# Patient Record
Sex: Female | Born: 1935
Health system: Southern US, Community
[De-identification: ages and names within clinical notes are randomized; demographics above are authoritative.]

## PROBLEM LIST (undated history)

## (undated) DIAGNOSIS — I609 Nontraumatic subarachnoid hemorrhage, unspecified: Secondary | ICD-10-CM

## (undated) DIAGNOSIS — S72001A Fracture of unspecified part of neck of right femur, initial encounter for closed fracture: Secondary | ICD-10-CM

## (undated) DIAGNOSIS — K635 Polyp of colon: Secondary | ICD-10-CM

## (undated) DIAGNOSIS — E785 Hyperlipidemia, unspecified: Secondary | ICD-10-CM

## (undated) DIAGNOSIS — S42291A Other displaced fracture of upper end of right humerus, initial encounter for closed fracture: Secondary | ICD-10-CM

## (undated) DIAGNOSIS — Z9989 Dependence on other enabling machines and devices: Principal | ICD-10-CM

## (undated) DIAGNOSIS — C50919 Malignant neoplasm of unspecified site of unspecified female breast: Secondary | ICD-10-CM

## (undated) DIAGNOSIS — M75100 Unspecified rotator cuff tear or rupture of unspecified shoulder, not specified as traumatic: Secondary | ICD-10-CM

## (undated) DIAGNOSIS — I459 Conduction disorder, unspecified: Secondary | ICD-10-CM

## (undated) DIAGNOSIS — I10 Essential (primary) hypertension: Secondary | ICD-10-CM

## (undated) DIAGNOSIS — Z974 Presence of external hearing-aid: Secondary | ICD-10-CM

## (undated) DIAGNOSIS — G5620 Lesion of ulnar nerve, unspecified upper limb: Secondary | ICD-10-CM

## (undated) DIAGNOSIS — H3530 Unspecified macular degeneration: Secondary | ICD-10-CM

## (undated) DIAGNOSIS — K219 Gastro-esophageal reflux disease without esophagitis: Secondary | ICD-10-CM

## (undated) DIAGNOSIS — K649 Unspecified hemorrhoids: Secondary | ICD-10-CM

## (undated) DIAGNOSIS — M8580 Other specified disorders of bone density and structure, unspecified site: Secondary | ICD-10-CM

## (undated) DIAGNOSIS — K429 Umbilical hernia without obstruction or gangrene: Secondary | ICD-10-CM

## (undated) DIAGNOSIS — I447 Left bundle-branch block, unspecified: Secondary | ICD-10-CM

## (undated) DIAGNOSIS — I219 Acute myocardial infarction, unspecified: Secondary | ICD-10-CM

## (undated) DIAGNOSIS — Z950 Presence of cardiac pacemaker: Secondary | ICD-10-CM

## (undated) DIAGNOSIS — I2510 Atherosclerotic heart disease of native coronary artery without angina pectoris: Secondary | ICD-10-CM

## (undated) DIAGNOSIS — G4733 Obstructive sleep apnea (adult) (pediatric): Secondary | ICD-10-CM

## (undated) DIAGNOSIS — J310 Chronic rhinitis: Secondary | ICD-10-CM

## (undated) DIAGNOSIS — R2681 Unsteadiness on feet: Secondary | ICD-10-CM

## (undated) HISTORY — DX: Presence of cardiac pacemaker: Z95.0

## (undated) HISTORY — DX: Lesion of ulnar nerve, unspecified upper limb: G56.20

## (undated) HISTORY — PX: CATARACT EXTRACTION, BILATERAL: SHX1313

## (undated) HISTORY — DX: Unspecified macular degeneration: H35.30

## (undated) HISTORY — DX: Conduction disorder, unspecified: I45.9

## (undated) HISTORY — DX: Unspecified rotator cuff tear or rupture of unspecified shoulder, not specified as traumatic: M75.100

## (undated) HISTORY — DX: Nontraumatic subarachnoid hemorrhage, unspecified: I60.9

## (undated) HISTORY — PX: BREAST LUMPECTOMY: SHX2

## (undated) HISTORY — DX: Left bundle-branch block, unspecified: I44.7

## (undated) HISTORY — DX: Umbilical hernia without obstruction or gangrene: K42.9

## (undated) HISTORY — PX: SHOULDER SURGERY: SHX246

## (undated) HISTORY — DX: Chronic rhinitis: J31.0

## (undated) HISTORY — DX: Other displaced fracture of upper end of right humerus, initial encounter for closed fracture: S42.291A

## (undated) HISTORY — DX: Obstructive sleep apnea (adult) (pediatric): G47.33

## (undated) HISTORY — DX: Polyp of colon: K63.5

## (undated) HISTORY — DX: Other specified disorders of bone density and structure, unspecified site: M85.80

## (undated) HISTORY — PX: TONSILLECTOMY: SUR1361

## (undated) HISTORY — PX: WRIST SURGERY: SHX841

## (undated) HISTORY — DX: Malignant neoplasm of unspecified site of unspecified female breast: C50.919

## (undated) HISTORY — DX: Gastro-esophageal reflux disease without esophagitis: K21.9

## (undated) HISTORY — DX: Unspecified hemorrhoids: K64.9

## (undated) HISTORY — DX: Hyperlipidemia, unspecified: E78.5

## (undated) HISTORY — DX: Presence of external hearing-aid: Z97.4

## (undated) HISTORY — DX: Fracture of unspecified part of neck of right femur, initial encounter for closed fracture: S72.001A

## (undated) HISTORY — DX: Unsteadiness on feet: R26.81

## (undated) HISTORY — DX: Dependence on other enabling machines and devices: Z99.89

## (undated) HISTORY — DX: Atherosclerotic heart disease of native coronary artery without angina pectoris: I25.10

---

## 1998-01-20 ENCOUNTER — Other Ambulatory Visit: Admission: RE | Admit: 1998-01-20 | Discharge: 1998-01-20 | Payer: Self-pay | Admitting: Obstetrics and Gynecology

## 1998-04-30 ENCOUNTER — Ambulatory Visit (HOSPITAL_COMMUNITY): Admission: RE | Admit: 1998-04-30 | Discharge: 1998-04-30 | Payer: Self-pay | Admitting: Internal Medicine

## 1998-04-30 ENCOUNTER — Encounter: Payer: Self-pay | Admitting: Internal Medicine

## 1999-02-25 ENCOUNTER — Other Ambulatory Visit: Admission: RE | Admit: 1999-02-25 | Discharge: 1999-02-25 | Payer: Self-pay | Admitting: Obstetrics and Gynecology

## 1999-05-05 ENCOUNTER — Ambulatory Visit (HOSPITAL_COMMUNITY): Admission: RE | Admit: 1999-05-05 | Discharge: 1999-05-05 | Payer: Self-pay | Admitting: Obstetrics and Gynecology

## 1999-05-05 ENCOUNTER — Encounter: Payer: Self-pay | Admitting: Obstetrics and Gynecology

## 2000-02-24 ENCOUNTER — Ambulatory Visit (HOSPITAL_BASED_OUTPATIENT_CLINIC_OR_DEPARTMENT_OTHER): Admission: RE | Admit: 2000-02-24 | Discharge: 2000-02-24 | Payer: Self-pay | Admitting: Otolaryngology

## 2000-06-03 ENCOUNTER — Encounter: Payer: Self-pay | Admitting: Obstetrics and Gynecology

## 2000-06-03 ENCOUNTER — Ambulatory Visit (HOSPITAL_COMMUNITY): Admission: RE | Admit: 2000-06-03 | Discharge: 2000-06-03 | Payer: Self-pay | Admitting: Obstetrics and Gynecology

## 2000-09-01 ENCOUNTER — Encounter: Admission: RE | Admit: 2000-09-01 | Discharge: 2000-09-01 | Payer: Self-pay | Admitting: Internal Medicine

## 2000-09-01 ENCOUNTER — Encounter: Payer: Self-pay | Admitting: Internal Medicine

## 2001-01-19 ENCOUNTER — Other Ambulatory Visit: Admission: RE | Admit: 2001-01-19 | Discharge: 2001-01-19 | Payer: Self-pay | Admitting: Obstetrics and Gynecology

## 2001-06-27 ENCOUNTER — Ambulatory Visit (HOSPITAL_COMMUNITY): Admission: RE | Admit: 2001-06-27 | Discharge: 2001-06-27 | Payer: Self-pay | Admitting: Obstetrics and Gynecology

## 2001-06-27 ENCOUNTER — Encounter: Payer: Self-pay | Admitting: Obstetrics and Gynecology

## 2002-02-19 ENCOUNTER — Other Ambulatory Visit: Admission: RE | Admit: 2002-02-19 | Discharge: 2002-02-19 | Payer: Self-pay | Admitting: Obstetrics and Gynecology

## 2002-09-14 ENCOUNTER — Ambulatory Visit (HOSPITAL_COMMUNITY): Admission: RE | Admit: 2002-09-14 | Discharge: 2002-09-14 | Payer: Self-pay | Admitting: Obstetrics and Gynecology

## 2002-09-14 ENCOUNTER — Encounter: Payer: Self-pay | Admitting: Obstetrics and Gynecology

## 2003-07-09 ENCOUNTER — Ambulatory Visit (HOSPITAL_COMMUNITY): Admission: RE | Admit: 2003-07-09 | Discharge: 2003-07-09 | Payer: Self-pay | Admitting: Gastroenterology

## 2003-07-09 ENCOUNTER — Encounter (INDEPENDENT_AMBULATORY_CARE_PROVIDER_SITE_OTHER): Payer: Self-pay | Admitting: Specialist

## 2003-09-16 ENCOUNTER — Ambulatory Visit (HOSPITAL_COMMUNITY): Admission: RE | Admit: 2003-09-16 | Discharge: 2003-09-16 | Payer: Self-pay | Admitting: Obstetrics and Gynecology

## 2004-08-06 ENCOUNTER — Ambulatory Visit (HOSPITAL_COMMUNITY): Admission: RE | Admit: 2004-08-06 | Discharge: 2004-08-06 | Payer: Self-pay | Admitting: Obstetrics and Gynecology

## 2004-09-16 ENCOUNTER — Ambulatory Visit (HOSPITAL_COMMUNITY): Admission: RE | Admit: 2004-09-16 | Discharge: 2004-09-16 | Payer: Self-pay | Admitting: Obstetrics and Gynecology

## 2004-10-28 ENCOUNTER — Encounter: Admission: RE | Admit: 2004-10-28 | Discharge: 2004-10-28 | Payer: Self-pay | Admitting: Orthopedic Surgery

## 2004-10-29 ENCOUNTER — Ambulatory Visit (HOSPITAL_BASED_OUTPATIENT_CLINIC_OR_DEPARTMENT_OTHER): Admission: RE | Admit: 2004-10-29 | Discharge: 2004-10-29 | Payer: Self-pay | Admitting: Orthopedic Surgery

## 2004-10-29 ENCOUNTER — Ambulatory Visit (HOSPITAL_COMMUNITY): Admission: RE | Admit: 2004-10-29 | Discharge: 2004-10-29 | Payer: Self-pay | Admitting: Orthopedic Surgery

## 2005-09-16 ENCOUNTER — Ambulatory Visit (HOSPITAL_BASED_OUTPATIENT_CLINIC_OR_DEPARTMENT_OTHER): Admission: RE | Admit: 2005-09-16 | Discharge: 2005-09-17 | Payer: Self-pay | Admitting: Orthopedic Surgery

## 2005-12-10 ENCOUNTER — Ambulatory Visit (HOSPITAL_COMMUNITY): Admission: RE | Admit: 2005-12-10 | Discharge: 2005-12-10 | Payer: Self-pay | Admitting: Obstetrics and Gynecology

## 2006-06-24 DIAGNOSIS — I2510 Atherosclerotic heart disease of native coronary artery without angina pectoris: Secondary | ICD-10-CM | POA: Insufficient documentation

## 2006-06-24 HISTORY — DX: Atherosclerotic heart disease of native coronary artery without angina pectoris: I25.10

## 2006-06-24 HISTORY — PX: CORONARY ANGIOPLASTY WITH STENT PLACEMENT: SHX49

## 2006-07-20 ENCOUNTER — Inpatient Hospital Stay (HOSPITAL_COMMUNITY): Admission: EM | Admit: 2006-07-20 | Discharge: 2006-07-24 | Payer: Self-pay | Admitting: Emergency Medicine

## 2006-08-11 ENCOUNTER — Encounter (HOSPITAL_COMMUNITY): Admission: RE | Admit: 2006-08-11 | Discharge: 2006-11-09 | Payer: Self-pay | Admitting: Interventional Cardiology

## 2006-11-10 ENCOUNTER — Encounter (HOSPITAL_COMMUNITY): Admission: RE | Admit: 2006-11-10 | Discharge: 2006-11-27 | Payer: Self-pay | Admitting: Interventional Cardiology

## 2006-11-29 ENCOUNTER — Encounter (HOSPITAL_COMMUNITY): Admission: RE | Admit: 2006-11-29 | Discharge: 2007-02-27 | Payer: Self-pay | Admitting: Interventional Cardiology

## 2006-12-16 ENCOUNTER — Ambulatory Visit (HOSPITAL_COMMUNITY): Admission: RE | Admit: 2006-12-16 | Discharge: 2006-12-16 | Payer: Self-pay | Admitting: Obstetrics and Gynecology

## 2006-12-22 ENCOUNTER — Encounter: Admission: RE | Admit: 2006-12-22 | Discharge: 2006-12-22 | Payer: Self-pay | Admitting: Obstetrics and Gynecology

## 2007-01-04 ENCOUNTER — Encounter: Admission: RE | Admit: 2007-01-04 | Discharge: 2007-01-04 | Payer: Self-pay | Admitting: Obstetrics and Gynecology

## 2007-01-04 ENCOUNTER — Encounter (INDEPENDENT_AMBULATORY_CARE_PROVIDER_SITE_OTHER): Payer: Self-pay | Admitting: Diagnostic Radiology

## 2007-01-17 ENCOUNTER — Encounter: Admission: RE | Admit: 2007-01-17 | Discharge: 2007-01-17 | Payer: Self-pay | Admitting: Obstetrics and Gynecology

## 2007-01-18 ENCOUNTER — Encounter: Admission: RE | Admit: 2007-01-18 | Discharge: 2007-01-18 | Payer: Self-pay | Admitting: Obstetrics and Gynecology

## 2007-01-30 ENCOUNTER — Encounter: Admission: RE | Admit: 2007-01-30 | Discharge: 2007-01-30 | Payer: Self-pay | Admitting: Obstetrics and Gynecology

## 2007-01-31 ENCOUNTER — Encounter (INDEPENDENT_AMBULATORY_CARE_PROVIDER_SITE_OTHER): Payer: Self-pay | Admitting: Surgery

## 2007-01-31 ENCOUNTER — Ambulatory Visit (HOSPITAL_BASED_OUTPATIENT_CLINIC_OR_DEPARTMENT_OTHER): Admission: RE | Admit: 2007-01-31 | Discharge: 2007-01-31 | Payer: Self-pay | Admitting: Surgery

## 2007-01-31 ENCOUNTER — Encounter: Admission: RE | Admit: 2007-01-31 | Discharge: 2007-01-31 | Payer: Self-pay | Admitting: Surgery

## 2007-02-14 ENCOUNTER — Ambulatory Visit: Payer: Self-pay | Admitting: Oncology

## 2007-02-22 ENCOUNTER — Ambulatory Visit: Admission: RE | Admit: 2007-02-22 | Discharge: 2007-04-12 | Payer: Self-pay | Admitting: Radiation Oncology

## 2007-04-07 ENCOUNTER — Ambulatory Visit: Payer: Self-pay | Admitting: Oncology

## 2007-06-06 ENCOUNTER — Encounter (HOSPITAL_COMMUNITY): Admission: RE | Admit: 2007-06-06 | Discharge: 2007-09-04 | Payer: Self-pay | Admitting: Interventional Cardiology

## 2007-06-08 ENCOUNTER — Ambulatory Visit: Payer: Self-pay | Admitting: Oncology

## 2007-09-24 ENCOUNTER — Encounter (HOSPITAL_COMMUNITY): Admission: RE | Admit: 2007-09-24 | Discharge: 2007-11-22 | Payer: Self-pay | Admitting: Interventional Cardiology

## 2007-11-23 ENCOUNTER — Encounter (HOSPITAL_COMMUNITY): Admission: RE | Admit: 2007-11-23 | Discharge: 2008-02-21 | Payer: Self-pay | Admitting: Interventional Cardiology

## 2007-12-25 ENCOUNTER — Encounter: Admission: RE | Admit: 2007-12-25 | Discharge: 2007-12-25 | Payer: Self-pay | Admitting: Internal Medicine

## 2008-02-22 ENCOUNTER — Encounter (HOSPITAL_COMMUNITY): Admission: RE | Admit: 2008-02-22 | Discharge: 2008-05-20 | Payer: Self-pay | Admitting: Interventional Cardiology

## 2008-05-24 ENCOUNTER — Encounter (HOSPITAL_COMMUNITY): Admission: RE | Admit: 2008-05-24 | Discharge: 2008-08-22 | Payer: Self-pay | Admitting: Interventional Cardiology

## 2008-08-02 ENCOUNTER — Ambulatory Visit: Payer: Self-pay | Admitting: Oncology

## 2008-08-23 ENCOUNTER — Encounter (HOSPITAL_COMMUNITY): Admission: RE | Admit: 2008-08-23 | Discharge: 2008-11-21 | Payer: Self-pay | Admitting: Interventional Cardiology

## 2008-11-22 ENCOUNTER — Encounter (HOSPITAL_COMMUNITY): Admission: RE | Admit: 2008-11-22 | Discharge: 2009-02-20 | Payer: Self-pay | Admitting: Interventional Cardiology

## 2008-12-25 ENCOUNTER — Encounter: Admission: RE | Admit: 2008-12-25 | Discharge: 2008-12-25 | Payer: Self-pay | Admitting: Obstetrics and Gynecology

## 2009-01-29 ENCOUNTER — Encounter: Admission: RE | Admit: 2009-01-29 | Discharge: 2009-01-29 | Payer: Self-pay | Admitting: Internal Medicine

## 2009-02-21 ENCOUNTER — Encounter (HOSPITAL_COMMUNITY): Admission: RE | Admit: 2009-02-21 | Discharge: 2009-05-22 | Payer: Self-pay | Admitting: Interventional Cardiology

## 2009-05-23 ENCOUNTER — Emergency Department (HOSPITAL_COMMUNITY): Admission: EM | Admit: 2009-05-23 | Discharge: 2009-05-23 | Payer: Self-pay | Admitting: Emergency Medicine

## 2009-05-24 ENCOUNTER — Encounter (HOSPITAL_COMMUNITY): Admission: RE | Admit: 2009-05-24 | Discharge: 2009-08-22 | Payer: Self-pay | Admitting: Interventional Cardiology

## 2009-08-01 ENCOUNTER — Ambulatory Visit: Payer: Self-pay | Admitting: Oncology

## 2009-08-23 ENCOUNTER — Encounter (HOSPITAL_COMMUNITY): Admission: RE | Admit: 2009-08-23 | Discharge: 2009-11-21 | Payer: Self-pay | Admitting: Interventional Cardiology

## 2009-11-22 ENCOUNTER — Encounter (HOSPITAL_COMMUNITY): Admission: RE | Admit: 2009-11-22 | Discharge: 2010-02-20 | Payer: Self-pay | Admitting: Interventional Cardiology

## 2009-12-29 ENCOUNTER — Encounter: Admission: RE | Admit: 2009-12-29 | Discharge: 2009-12-29 | Payer: Self-pay | Admitting: Interventional Cardiology

## 2010-01-19 ENCOUNTER — Encounter: Admission: RE | Admit: 2010-01-19 | Discharge: 2010-01-19 | Payer: Self-pay | Admitting: Oncology

## 2010-02-13 ENCOUNTER — Ambulatory Visit: Payer: Self-pay | Admitting: Oncology

## 2010-02-21 ENCOUNTER — Encounter (HOSPITAL_COMMUNITY)
Admission: RE | Admit: 2010-02-21 | Discharge: 2010-05-22 | Payer: Self-pay | Source: Home / Self Care | Attending: Interventional Cardiology | Admitting: Interventional Cardiology

## 2010-05-11 ENCOUNTER — Ambulatory Visit: Payer: Self-pay | Admitting: Oncology

## 2010-06-03 ENCOUNTER — Encounter (HOSPITAL_COMMUNITY)
Admission: RE | Admit: 2010-06-03 | Discharge: 2010-06-23 | Payer: Self-pay | Source: Home / Self Care | Attending: Interventional Cardiology | Admitting: Interventional Cardiology

## 2010-06-24 ENCOUNTER — Ambulatory Visit (HOSPITAL_COMMUNITY): Payer: Self-pay | Attending: Interventional Cardiology

## 2010-06-25 ENCOUNTER — Encounter (HOSPITAL_COMMUNITY): Payer: Self-pay | Attending: Interventional Cardiology

## 2010-06-25 DIAGNOSIS — I447 Left bundle-branch block, unspecified: Secondary | ICD-10-CM | POA: Insufficient documentation

## 2010-06-25 DIAGNOSIS — E876 Hypokalemia: Secondary | ICD-10-CM | POA: Insufficient documentation

## 2010-06-25 DIAGNOSIS — I2510 Atherosclerotic heart disease of native coronary artery without angina pectoris: Secondary | ICD-10-CM | POA: Insufficient documentation

## 2010-06-25 DIAGNOSIS — Z9861 Coronary angioplasty status: Secondary | ICD-10-CM | POA: Insufficient documentation

## 2010-06-25 DIAGNOSIS — Z5189 Encounter for other specified aftercare: Secondary | ICD-10-CM | POA: Insufficient documentation

## 2010-06-25 DIAGNOSIS — I959 Hypotension, unspecified: Secondary | ICD-10-CM | POA: Insufficient documentation

## 2010-06-25 DIAGNOSIS — I472 Ventricular tachycardia, unspecified: Secondary | ICD-10-CM | POA: Insufficient documentation

## 2010-06-25 DIAGNOSIS — E785 Hyperlipidemia, unspecified: Secondary | ICD-10-CM | POA: Insufficient documentation

## 2010-06-25 DIAGNOSIS — I4729 Other ventricular tachycardia: Secondary | ICD-10-CM | POA: Insufficient documentation

## 2010-06-25 DIAGNOSIS — I498 Other specified cardiac arrhythmias: Secondary | ICD-10-CM | POA: Insufficient documentation

## 2010-06-25 DIAGNOSIS — I252 Old myocardial infarction: Secondary | ICD-10-CM | POA: Insufficient documentation

## 2010-06-30 ENCOUNTER — Encounter (HOSPITAL_COMMUNITY): Payer: Self-pay

## 2010-07-01 ENCOUNTER — Encounter (HOSPITAL_COMMUNITY): Payer: Self-pay

## 2010-07-02 ENCOUNTER — Encounter (HOSPITAL_COMMUNITY): Payer: Self-pay

## 2010-07-07 ENCOUNTER — Encounter (HOSPITAL_COMMUNITY): Payer: Self-pay

## 2010-07-08 ENCOUNTER — Encounter (HOSPITAL_COMMUNITY): Payer: Self-pay

## 2010-07-09 ENCOUNTER — Encounter (HOSPITAL_COMMUNITY): Payer: Self-pay

## 2010-07-14 ENCOUNTER — Encounter (HOSPITAL_COMMUNITY): Payer: Self-pay

## 2010-07-15 ENCOUNTER — Encounter (HOSPITAL_COMMUNITY): Payer: Self-pay

## 2010-07-16 ENCOUNTER — Encounter (HOSPITAL_COMMUNITY): Payer: Self-pay

## 2010-07-21 ENCOUNTER — Encounter (HOSPITAL_COMMUNITY): Payer: Self-pay

## 2010-07-22 ENCOUNTER — Encounter (HOSPITAL_COMMUNITY): Payer: Self-pay

## 2010-07-23 ENCOUNTER — Encounter (HOSPITAL_COMMUNITY): Payer: Self-pay | Attending: Interventional Cardiology

## 2010-07-23 DIAGNOSIS — Z5189 Encounter for other specified aftercare: Secondary | ICD-10-CM | POA: Insufficient documentation

## 2010-07-23 DIAGNOSIS — I959 Hypotension, unspecified: Secondary | ICD-10-CM | POA: Insufficient documentation

## 2010-07-23 DIAGNOSIS — I472 Ventricular tachycardia, unspecified: Secondary | ICD-10-CM | POA: Insufficient documentation

## 2010-07-23 DIAGNOSIS — I447 Left bundle-branch block, unspecified: Secondary | ICD-10-CM | POA: Insufficient documentation

## 2010-07-23 DIAGNOSIS — I4729 Other ventricular tachycardia: Secondary | ICD-10-CM | POA: Insufficient documentation

## 2010-07-23 DIAGNOSIS — E876 Hypokalemia: Secondary | ICD-10-CM | POA: Insufficient documentation

## 2010-07-23 DIAGNOSIS — E785 Hyperlipidemia, unspecified: Secondary | ICD-10-CM | POA: Insufficient documentation

## 2010-07-23 DIAGNOSIS — I2510 Atherosclerotic heart disease of native coronary artery without angina pectoris: Secondary | ICD-10-CM | POA: Insufficient documentation

## 2010-07-23 DIAGNOSIS — Z9861 Coronary angioplasty status: Secondary | ICD-10-CM | POA: Insufficient documentation

## 2010-07-23 DIAGNOSIS — I498 Other specified cardiac arrhythmias: Secondary | ICD-10-CM | POA: Insufficient documentation

## 2010-07-23 DIAGNOSIS — I252 Old myocardial infarction: Secondary | ICD-10-CM | POA: Insufficient documentation

## 2010-07-28 ENCOUNTER — Encounter (HOSPITAL_COMMUNITY): Payer: Self-pay

## 2010-07-29 ENCOUNTER — Encounter (HOSPITAL_COMMUNITY): Payer: Self-pay

## 2010-07-30 ENCOUNTER — Encounter (HOSPITAL_COMMUNITY): Payer: Self-pay

## 2010-08-04 ENCOUNTER — Encounter (HOSPITAL_COMMUNITY): Payer: Self-pay

## 2010-08-05 ENCOUNTER — Encounter (HOSPITAL_COMMUNITY): Payer: Self-pay

## 2010-08-06 ENCOUNTER — Encounter (HOSPITAL_BASED_OUTPATIENT_CLINIC_OR_DEPARTMENT_OTHER): Payer: Medicare Other | Admitting: Oncology

## 2010-08-06 ENCOUNTER — Encounter (HOSPITAL_COMMUNITY): Payer: Self-pay

## 2010-08-06 DIAGNOSIS — Z170 Estrogen receptor positive status [ER+]: Secondary | ICD-10-CM

## 2010-08-06 DIAGNOSIS — D0590 Unspecified type of carcinoma in situ of unspecified breast: Secondary | ICD-10-CM

## 2010-08-11 ENCOUNTER — Encounter (HOSPITAL_COMMUNITY): Payer: Self-pay

## 2010-08-12 ENCOUNTER — Encounter (HOSPITAL_COMMUNITY): Payer: Self-pay

## 2010-08-13 ENCOUNTER — Encounter (HOSPITAL_COMMUNITY): Payer: Self-pay

## 2010-08-18 ENCOUNTER — Encounter (HOSPITAL_COMMUNITY): Payer: Self-pay

## 2010-08-19 ENCOUNTER — Encounter (HOSPITAL_COMMUNITY): Payer: Self-pay

## 2010-08-20 ENCOUNTER — Encounter (HOSPITAL_COMMUNITY): Payer: Self-pay

## 2010-08-24 LAB — CBC
Hemoglobin: 12 g/dL (ref 12.0–15.0)
RDW: 11.9 % (ref 11.5–15.5)

## 2010-08-25 ENCOUNTER — Encounter (HOSPITAL_COMMUNITY): Payer: Self-pay | Attending: Interventional Cardiology

## 2010-08-25 DIAGNOSIS — Z5189 Encounter for other specified aftercare: Secondary | ICD-10-CM | POA: Insufficient documentation

## 2010-08-25 DIAGNOSIS — I498 Other specified cardiac arrhythmias: Secondary | ICD-10-CM | POA: Insufficient documentation

## 2010-08-25 DIAGNOSIS — I447 Left bundle-branch block, unspecified: Secondary | ICD-10-CM | POA: Insufficient documentation

## 2010-08-25 DIAGNOSIS — I2510 Atherosclerotic heart disease of native coronary artery without angina pectoris: Secondary | ICD-10-CM | POA: Insufficient documentation

## 2010-08-25 DIAGNOSIS — I472 Ventricular tachycardia, unspecified: Secondary | ICD-10-CM | POA: Insufficient documentation

## 2010-08-25 DIAGNOSIS — I959 Hypotension, unspecified: Secondary | ICD-10-CM | POA: Insufficient documentation

## 2010-08-25 DIAGNOSIS — Z9861 Coronary angioplasty status: Secondary | ICD-10-CM | POA: Insufficient documentation

## 2010-08-25 DIAGNOSIS — I252 Old myocardial infarction: Secondary | ICD-10-CM | POA: Insufficient documentation

## 2010-08-25 DIAGNOSIS — I4729 Other ventricular tachycardia: Secondary | ICD-10-CM | POA: Insufficient documentation

## 2010-08-25 DIAGNOSIS — E785 Hyperlipidemia, unspecified: Secondary | ICD-10-CM | POA: Insufficient documentation

## 2010-08-25 DIAGNOSIS — E876 Hypokalemia: Secondary | ICD-10-CM | POA: Insufficient documentation

## 2010-08-26 ENCOUNTER — Encounter (HOSPITAL_COMMUNITY): Payer: Self-pay

## 2010-08-27 ENCOUNTER — Encounter (HOSPITAL_COMMUNITY): Payer: Self-pay

## 2010-09-01 ENCOUNTER — Encounter (HOSPITAL_COMMUNITY): Payer: Self-pay

## 2010-09-02 ENCOUNTER — Encounter (HOSPITAL_COMMUNITY): Payer: Self-pay

## 2010-09-03 ENCOUNTER — Encounter (HOSPITAL_COMMUNITY): Payer: Self-pay

## 2010-09-08 ENCOUNTER — Encounter (HOSPITAL_COMMUNITY): Payer: Self-pay

## 2010-09-09 ENCOUNTER — Encounter (HOSPITAL_COMMUNITY): Payer: Self-pay

## 2010-09-10 ENCOUNTER — Encounter (HOSPITAL_COMMUNITY): Payer: Self-pay

## 2010-09-15 ENCOUNTER — Encounter (HOSPITAL_COMMUNITY): Payer: Self-pay

## 2010-09-16 ENCOUNTER — Encounter (HOSPITAL_COMMUNITY): Payer: Self-pay

## 2010-09-17 ENCOUNTER — Encounter (HOSPITAL_COMMUNITY): Payer: Self-pay

## 2010-09-22 ENCOUNTER — Encounter (HOSPITAL_COMMUNITY): Payer: Self-pay

## 2010-09-23 ENCOUNTER — Encounter (HOSPITAL_COMMUNITY): Payer: Self-pay

## 2010-09-24 ENCOUNTER — Encounter (HOSPITAL_COMMUNITY): Payer: Self-pay | Attending: Interventional Cardiology

## 2010-09-24 DIAGNOSIS — I252 Old myocardial infarction: Secondary | ICD-10-CM | POA: Insufficient documentation

## 2010-09-24 DIAGNOSIS — E876 Hypokalemia: Secondary | ICD-10-CM | POA: Insufficient documentation

## 2010-09-24 DIAGNOSIS — I472 Ventricular tachycardia, unspecified: Secondary | ICD-10-CM | POA: Insufficient documentation

## 2010-09-24 DIAGNOSIS — I498 Other specified cardiac arrhythmias: Secondary | ICD-10-CM | POA: Insufficient documentation

## 2010-09-24 DIAGNOSIS — E785 Hyperlipidemia, unspecified: Secondary | ICD-10-CM | POA: Insufficient documentation

## 2010-09-24 DIAGNOSIS — Z5189 Encounter for other specified aftercare: Secondary | ICD-10-CM | POA: Insufficient documentation

## 2010-09-24 DIAGNOSIS — I959 Hypotension, unspecified: Secondary | ICD-10-CM | POA: Insufficient documentation

## 2010-09-24 DIAGNOSIS — Z9861 Coronary angioplasty status: Secondary | ICD-10-CM | POA: Insufficient documentation

## 2010-09-24 DIAGNOSIS — I4729 Other ventricular tachycardia: Secondary | ICD-10-CM | POA: Insufficient documentation

## 2010-09-24 DIAGNOSIS — I2510 Atherosclerotic heart disease of native coronary artery without angina pectoris: Secondary | ICD-10-CM | POA: Insufficient documentation

## 2010-09-24 DIAGNOSIS — I447 Left bundle-branch block, unspecified: Secondary | ICD-10-CM | POA: Insufficient documentation

## 2010-09-29 ENCOUNTER — Encounter (HOSPITAL_COMMUNITY): Payer: Self-pay

## 2010-09-30 ENCOUNTER — Encounter (HOSPITAL_COMMUNITY): Payer: Self-pay

## 2010-10-01 ENCOUNTER — Encounter (HOSPITAL_COMMUNITY): Payer: Self-pay

## 2010-10-06 ENCOUNTER — Encounter (HOSPITAL_COMMUNITY): Payer: Self-pay

## 2010-10-06 NOTE — Op Note (Signed)
NAMEJANIJAH, [name]                   ACCOUNT NO.:  [id number]   MEDICAL RECORD NO.:  [id number]          PATIENT TYPE:  AMB   LOCATION:  DSC                          FACILITY:  [facility]   PHYSICIAN:  Sandria Bales. Ezzard Standing, M.D.  DATE OF BIRTH:  1929-01-11   DATE OF PROCEDURE:  01/31/2007  DATE OF DISCHARGE:                               OPERATIVE REPORT   PREOPERATIVE DIAGNOSIS:  Ductal carcinoma in situ left breast 3 o'clock  position.   POSTOPERATIVE DIAGNOSIS:  Ductal carcinoma in situ left breast 3 o'clock  position, final pathology pending.   PROCEDURE:  Left breast lumpectomy (needle localization x2), injection  of sub areolar methylene blue, and left sentinel lymph node biopsy.   SURGEON:  Sandria Bales. Ezzard Standing, M.D.   FIRST ASSISTANT:  None.   ANESTHESIA:  General LMA and 30 mL of 0.25% Marcaine.   COMPLICATIONS:  None.   INDICATIONS FOR PROCEDURE:  [name] is a 77 year old white female  patient of Dr. Johnella Moloney who has new onset of microcalcifications of  the left breast at the 3 o'clock position.  A core biopsy of this showed  a high grade ductal carcinoma in situ and this seems to cover an area of  3-4 cm.  Because of the high grade nature, we suggested doing a sentinel  lymph node at the same time.   The indications and potential complications of the surgery were  explained to the patient.  The potential complications include but are  not limited to bleeding, infection, nerve injury, recurrence of the  tumor, and need for further surgery.   OPERATIVE NOTE:  [facility]  she had a wire localization x2 by Dr. Cain Saupe.  The two wires  were coming out fairly lateral in the left breast at the 3 o'clock  position.  She then presented to the [facility]. She had an  injection of 1 mCi of technesium sulfa colloid.  She was then taken to  the operating room where she underwent a general LMA anesthesia.  She  had her left  arm and side marked before surgery and had the wires coming  out of that breast, also. She was given 1 gram of Ancef at initiation of  the procedure.  She had her left breast prepped with Betadine solution  and sterilely draped and a time out was held identifying the patient and  the procedure.   First, I went after the sentinel lymph node.  I identified an area in  the left axilla right at the edge of the breast posterior to the  pectoralis major muscle which was hot. I cut down to this and I found a  blue node which had counts of some 1200. The background counts were only  1-2, so this was clearly a hot blue node.  This was sent to pathology.  Dr. Baruch Merl called and said the sentinel lymph node touch prep was  negative.   I then turned my attention to the breast, itself.  I made a radial  incision encompassing the medial wire which was closer to the nipple,  but the incision is at the lateral part of the breast at the 3 o'clock  position.  I cut down excising a block of breast tissue approximately 7-  8 cm long, about 5 cm wide.  I went down to the chest wall.  I thought I  got well around both wires and sent this for specimen mammogram.   A phone call by Dr. Deboraha Sprang confirmed that she felt the calcifications was  well within the specimen.  I did mark the specimen.  It had skin  anteriorly, the wire was coming out laterally, I made a long suture  cranial and a short suture medial.   I then injected about 30 mL of 0.25% Marcaine, both in the breast wound  and the axilla.  I did try to lift up the breast a little bit to close  it some and then closed the breast tissues with interrupted 3-0 Vicryl  sutures.  I irrigated the wound with 2 liters of saline and closed the  subcutaneous tissues with 3-0 Vicryl sutures and the skin with two  running 5-0 Vicryl sutures, both in the axilla and the left breast  wound.  I then painted the wound with tincture of Benzoin and applied   Steri-Strips.   She tolerated the procedure well and was transported to the recovery  room in good condition.  Sponge and needle counts were correct at the  end of the case.      Sandria Bales. Ezzard Standing, M.D.  Electronically Signed     DHN/MEDQ  D:  01/31/2007  T:  01/31/2007  Job:  [id number]   cc:   Candyce Churn, M.D.  Daryl Eastern, M.D.  Sherry A. Rosalio Macadamia, M.D.  Corky Crafts, MD

## 2010-10-07 ENCOUNTER — Encounter (HOSPITAL_COMMUNITY): Payer: Self-pay

## 2010-10-08 ENCOUNTER — Encounter (HOSPITAL_COMMUNITY): Payer: Self-pay

## 2010-10-09 NOTE — Op Note (Signed)
NAME:  DEEANNA, [name]                   ACCOUNT NO.:  [id number]   MEDICAL RECORD NO.:  [id number]          PATIENT TYPE:  AMB   LOCATION:  DSC                          FACILITY:  [facility]   PHYSICIAN:  Katy Fitch. Sypher, M.D. DATE OF BIRTH:  1928-10-17   DATE OF PROCEDURE:  10/29/2004  DATE OF DISCHARGE:                                 OPERATIVE REPORT   PREOP DIAGNOSIS:  Comminuted fracture right distal radius.   POSTOP DIAGNOSIS:  Comminuted fracture right distal radius.   OPERATION:  Open reduction and internal fixation of comminuted right distal  radius fracture utilizing a 7-peg, DVR plate system.   OPERATOR:  Katy Fitch. Sypher, M.D.   ASSISTANT:  Marveen Reeks. Dasnoit, P.A.-C.   ANESTHESIA:  General by LMA.   SUPERVISING ANESTHESIOLOGIST:  Sheldon Silvan, M.D.   INDICATIONS:  [name] is 77 year old woman who fell at home  earlier this week, sustaining a comminuted intra-articular fracture of her  right distal radius.   She was seen at the [facility] where x-rays confirmed a  comminuted distal metaphyseal fracture with a fracture into the scaphoid  facette and the distal radioulnar joint.   She was placed in a sugar-tong splint and advised to schedule elective  reconstruction at this time utilizing a DVR volar plate system.   Preoperatively, she was advised of potential risks, benefits of surgery.  Questions were invited, answered.   DESCRIPTION OF PROCEDURE:  [name] is brought to the operating  room and placed in supine position on the operating table.   Following the induction of general anesthesia by LMA, the right arm was  prepped with Betadine soap and solution and sterilely draped.   Following exsanguination of limb with Esmarch bandage an arterial tourniquet  was inflated to 250 mmHg.   Procedure commenced with a DVR incision paralleling the flexor carpi  radialis. The subcutaneous tissue was carefully divided, electrocauterizing  transverse veins.   The fascia at the floor of the flexor carpi radialis sheath was released  allowing ulnar retraction of flexor pollicis longus exposing the pronator  quadratus muscle. The pronator quadratus was elevated with a periosteal  elevator and the capsule of the wrist joint exposed distally. The fracture  was reduced by 3-point molding and longitudinal traction followed by  application of a 7-peg DVR plate system utilizing threaded pegs in the  proximal row, radial and ulnar, and smooth pegs thereafter.   A very satisfactory anatomic reconstruction of the distal radial fracture  was achieved. Care was taken not to enter the floor of the fourth dorsal  compartment with the pegs.   The plate was then affixed to the radial shaft utilizing the cortical 3.5 mm  screws.   Final images AP/lateral were obtained documenting satisfactory reduction of  the fracture and proper hardware position.   The fracture then irrigated with sterile saline followed by repair the  pronator quadratus of the distal plate with a running suture of #0 Vicryl  followed by repair of the skin with subdermal sutures of 3-0 Vicryl and  intradermal  3-0 Prolene.   A compressive dressing was applied with a sugar-tong splint, maintaining the  forearm in a neutral position and the wrist at a few degrees of  dorsiflexion.   For aftercare, we will encourage Ms. Jarrard to begin immediate range of motion  exercises to her fingers and thumb. She will excise her shoulder. She will  return to our office for follow-up for check x-ray and dressing change in 1  week.       RVS/MEDQ  D:  10/29/2004  T:  10/29/2004  Job:  [id number]   cc:   Winfield Rast Sypher, M.D.  [address]  [place]  [place] [address]  Fax: [phone]

## 2010-10-09 NOTE — Cardiovascular Report (Signed)
NAMEMarland Kitchen  EZMA, REHM NO.:  [id number]   MEDICAL RECORD NO.:  [id number]          PATIENT TYPE:  INP   LOCATION:  2904                         FACILITY:  [facility]   PHYSICIAN:  Corky Crafts, MDDATE OF BIRTH:  20-Nov-1928   DATE OF PROCEDURE:  07/20/2006  DATE OF DISCHARGE:                            CARDIAC CATHETERIZATION   REFERRING PHYSICIAN:  Dr. Olena Leatherwood.   PROCEDURES PERFORMED:  Left heart catheterization, left ventriculogram,  coronary angiogram, abdominal aortogram, percutaneous coronary  intervention of the right coronary artery.   INDICATIONS:  Inferior ST-segment elevation MI.   OPERATOR:  Dr. Eldridge Dace.   PROCEDURE NARRATIVE:  The risks and benefits of cardiac catheterization  were quickly explained to the patient, and informed consent was  obtained.  She was brought to the catheterization lab.  Her right groin  was prepped and draped in the usual sterile fashion.  One percent  lidocaine was infiltrated with subcutaneous tissue.  A 6-French arterial  sheath was placed into the right femoral artery using modified Seldinger  technique.  Left coronary artery angiography was performed using a JL-4  posterior catheter.  The catheter was advanced to the vessel ostium  under fluoroscopic guidance.  Digital angiography was performed in  multiple projections using hand injection of contrast.  Right coronary  artery angiography was then performed using a JR-4.0 guiding catheter.  Digital angiography was performed in multiple projections using hand  injection of contrast.  The PCI of the right coronary artery was then  performed.  Please see below for details.  The left ventriculogram was  then performed with a pigtail catheter.  It was advanced to the aortic  valve and across the valve under fluoroscopic guidance.  A pullback was  performed, under continuous hemodynamic pressure monitoring, the  catheter was pulled back to the level of the abdominal  aorta, and a  power injection of contrast was performed.  Prior to the PCI, a 5-French  venous sheath was placed in the right femoral vein using the modified  Seldinger technique.  The sheaths were subsequently removed using manual  compression.   FINDINGS:  Left main has mild atherosclerosis.  The circumflex is a  large vessel with mild atherosclerosis.  The OM-1 is a large vessel also  with mild atherosclerosis.  The left anterior descending is a large  vessel with minor irregularities.  The first and second diagonals were  medium-sized vessels with mild atherosclerosis.  The right coronary  artery was a large dominant vessel.  This was occluded in the mid  portion.  There was mild disease in the proximal portion noted.  The  left ventriculogram revealed mild inferobasal hypokinesis.  Otherwise,  normal wall motion.  The estimated left ventricular ejection fraction  was 55%.  The abdominal aortogram showed a single left renal artery  which was widely patent.  There was dual arterial supply to the right  kidney, both of which were widely patent.   HEMODYNAMICS:  There is no significant aortic valve gradient.  She had  LVEDP of 29 mmHg.   PERCUTANEOUS CORONARY INTERVENTION  NARRATIVE:  The right coronary artery  was engaged with a JR-4.0 guiding catheter.  A Prowater wire was placed  across the occlusion.  Flow was restored just with the wire.  A 2.0 x 9-  mm balloon was then inflated across the lesion to 10 atmospheres for 21  seconds.  The patient did experience some bradycardia and hypotension  with this balloon inflation.  She received 1 mg of atropine which  brought her heart rate back to normal.  A 3.0 x 16-mm Liberte stent was  then deployed across the lesion at 14 atmospheres for 39 seconds.  The  mid portion of the stent was then post dilated with a 3.5 x 12-mm  Quantum balloon inflated to 18 atmospheres for 28 seconds.  There was an  excellent angiographic result.  The  initial TIMI flow was 0 at the end  of the procedure.  There is TIMI 3 flow with no residual stenosis.   IMPRESSION:  1. Acute inferior ST-segment elevation myocardial infarction.  2. Successful bare-metal stent placement into the mid-right coronary      artery with a 3.0 x 16-mm Liberte stent which was post dilated to      3.6 mm in diameter.  3. Overall normal ventricular function despite a small area of mid to      basal inferior hypokinesis.  4. Mildly elevated left ventricular end-diastolic pressure.  5. No renal artery stenosis.   RECOMMENDATIONS:  The patient will be watched in the CCU.  She will  continue on aspirin and Plavix.  She will receive Integrilin for 18  hours.  Will have to start a statin and beta blocker as well for  secondary prevention.      Corky Crafts, MD  Electronically Signed     JSV/MEDQ  D:  07/21/2006  T:  07/21/2006  Job:  [id number]

## 2010-10-09 NOTE — Discharge Summary (Signed)
NAME:  [name], [name]                   ACCOUNT NO.:  [id number]   MEDICAL RECORD NO.:  [id number]          PATIENT TYPE:  INP   LOCATION:  3736                         FACILITY:  [facility]   PHYSICIAN:  Corky Crafts, MDDATE OF BIRTH:  10/25/1928   DATE OF ADMISSION:  07/20/2006  DATE OF DISCHARGE:  07/24/2006                               DISCHARGE SUMMARY   DISCHARGE DIAGNOSES:  1. Acute inferior myocardial infarction, status post bare metal stent      to the right coronary artery.  2. Hypokalemia, repleted.  3. Chronic left bundle branch block.  4. Allergy to CODEINE AND CHLOR-TRIMETON.  5. Family history of early coronary artery disease.   HISTORY OF PRESENT ILLNESS:  [name] is a 77 year old female who is  admitted to [facility] on July 20, 2006 who began having chest  pain after eating breakfast.  She began feeling weak and presyncopal and  had chest pressure.  She took her blood pressure and heart rate at home  and her blood pressure was 91 with a heart rate in the 40s.  EMS was  called.  She eventually received Atropine in the emergency room and in  addition intravenous Nitroglycerin at 5 mcg.  EKG showed a left bundle  branch block which was apparently present in the past.  She said that  she had an echo and Cardiolite around 2004 and reports this as normal.  Interestingly, her EKG showed 1 mm ST segment elevation inferior  laterally.  The patient continues to have heart rates in the 40s with  chest pressure along with diaphoresis.  She was seen in addition with  Dr. Corky Crafts.  Her initial cardiac enzyme was negative times  one, however, the patient appeared ill and continued to have chest pain  through which she was brought to the cath lab emergently.  While in the  cath lab she was found to have an occlusion in the mid right coronary  artery.  The bare metal stent was placed and the patient did well.  Her  LV gram showed mild inferobasal hypokinesis,  otherwise normal.  Her EF  was 55%.   Lab studies during the patient's hospitalization showed a maximum CK of  1118 with an MB fraction of 121.8, maximum troponin at 31.98, total  cholesterol 161, triglycerides 84, HDL 47, LDL 17.  Sodium 133,  potassium 3.6, BUN 8, creatinine 0.67, white count 10.8, hematocrit  35.2, hemoglobin 12.1, platelets 254.   The patient was set up for cardiac rehab phase 1 and 2.  By July 24, 2006, she was ready for discharge to home.  She was discharged to home  on the following medications.   DISCHARGE MEDICATIONS:  1. Enteric coated aspirin 325 mg daily.  2. Plavix 75 mg daily.  3. Simvastatin 20 mg daily.  4. Metoprolol ER 25 mg daily.  5. Lisinopril 5 mg daily.  6. Multivitamin daily and vitamin D daily.  7. She is to increase her activity slowly and remain on a low sodium      Heart Healthy  diet.  May shower and bathe, no lifting over 10      pounds for one week.  Follow up with Dr. Eldridge Dace on August 05, 2006.   DICTATED BY:  Guy Franco PA-C      Corky Crafts, MD  Electronically Signed     JSV/MEDQ  D:  08/29/2006  T:  08/29/2006  Job:  [id number]   cc:   Ladell Pier, M.D.

## 2010-10-09 NOTE — Op Note (Signed)
NAME:  [name], [name]                     ACCOUNT NO.:  [id number]   MEDICAL RECORD NO.:  [id number]                   PATIENT TYPE:  AMB   LOCATION:  ENDO                                 FACILITY:  [facility]   PHYSICIAN:  Petra Kuba, M.D.                 DATE OF BIRTH:  10/30/1928   DATE OF PROCEDURE:  07/09/2003  DATE OF DISCHARGE:                                 OPERATIVE REPORT   PROCEDURE:  Colonoscopy with biopsy.   INDICATION:  Screening,  Consent was signed after risks, benefits, methods,  and options were thoroughly discussed in the office.   MEDICINES USED:  Demerol 70, Versed 2.5.   DESCRIPTION OF PROCEDURE:  Rectal inspection was pertinent for external  hemorrhoids.  Digital exam was negative.  Video pediatric adjustable  colonoscopy was inserted easily to approximately the level of the splenic  flexure.  At that point, there was looping. I rolled her on her back and  with abdominal pressure, it was able to be advanced to the level of the  ileocecal valve.  We initially rolled her on her right side, but to advanced  to the cecal pole required rolling on her left side.  No obvious abnormality  was seen on insertion. The cecum was identified via the appendiceal orifice  and the ileocecal valve.  In fact, the scope was inserted a short ways into  the terminal ileum which was normal.  Photo documentation was obtained.  The  prep was adequate.  There was some liquid stool that required washing and  suctioning.  On slow withdrawal through the colon, when we fell back around  the loop, we did try to readvance around the corner and around the curves to  decrease chances of missing things.  The right side of the colon was normal  as was the transverse. In the mid-descending, a tiny probable hyperplastic-  appearing polyp was seen and was cold biopsied x2 and put in the first  container.  On slow withdrawal through the left side of the colon, rare  diverticula were seen.   Once back in the rectum, another tiny polyp was seen  and was cold biopsied x3 and put a separate container.  Anorectal pull  through in retroflexion confirmed some small hemorrhoids.  The scope was  reinserted a short ways up the left side of the colon.  Air was suctioned  and the scope removed.  The patient tolerated the procedure well.  There was  no obvious immediate complication.   ENDOSCOPIC DIAGNOSES:  1. Internal and external hemorrhoids.  2. Rare left-sided diverticula.  3. Two tiny rectal and descending polyps cold biopsied.  4. Otherwise within normal limits to the terminal ileum.   PLAN:  Await pathology to determine future colonic screening.  Happy to see  back p.r.n.  Consider repeating in five years if doing well medically  depending on pathology an otherwise return care to Dr.  MacArthur for the  customary healthcare maintenance and screening including yearly rectals and  guaiacs.                                               Petra Kuba, M.D.   MEM/MEDQ  D:  07/09/2003  T:  07/09/2003  Job:  [id number]   cc:   Ike Bene, M.D.

## 2010-10-09 NOTE — Op Note (Signed)
[name], Richmond                   ACCOUNT NO.:  [id number]   MEDICAL RECORD NO.:  [id number]          PATIENT TYPE:  AMB   LOCATION:  DSC                          FACILITY:  [facility]   PHYSICIAN:  Katy Fitch. Sypher, M.D. DATE OF BIRTH:  1928-11-14   DATE OF PROCEDURE:  09/16/2005  DATE OF DISCHARGE:                                 OPERATIVE REPORT   PREOPERATIVE DIAGNOSIS:  Complex acute on chronic rotator cuff avulsion,  left shoulder, with loss of active abduction.   POSTOPERATIVE DIAGNOSIS:  Complex acute on chronic rotator cuff avulsion,  left shoulder, with loss of active abduction, with necrotic avulsion of  supraspinatus, infraspinatus and teres minor tendon and tendinopathy of  subscapularis tendon.   OPERATION:  1.  Diagnostic arthroscopy of left glenohumeral joint to confirm      reparability of rotator cuff avulsion.  2.  Open reconstruction of left rotator cuff including supraspinatus,      infraspinatus and teres minor tendons, utilizing two bio-corkscrew      anchors and two push-lock over-the-top Velay suture complexes and      multiple through-bone McLaughlin sutures utilized due to profound      osteopenia at the greater tuberosity.  3.  Open distal clavicle resection.  4.  Subacromial decompression, bursectomy and coracoacromial ligament      release.   OPERATING SURGEON:  Katy Fitch. Sypher, M.D.   ASSISTANT:  Molly Maduro Dasnoit PA-C.   ANESTHESIA:  General endotracheal supplemented by interscalene block,  supervising anesthesiologist is Dr. Gelene Mink.   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   Immediately thereafter, she had swelling and ecchymosis on the lateral  brachium and had the inability to actively abduct her arm.   She presented for evaluation of her shoulder approximately five days later  it was noted to have a deficit of active abduction of  the arm with full  passive elevation.  This is the classic finding for an acute rotator cuff  avulsion.   She was sent for an urgent MRI, which was obtained at [facility] and  revealed a complete supraspinatus, infraspinatus and teres minor avulsion  and tendinopathy of the subscapularis.   She had very unfavorable AC and acromial anatomy.   After a lengthy informed consent, we recommended proceeding with repair of  her rotator cuff either complete for as much as possible in an effort to  stabilize the glenohumeral joint and to prevent the onset of cuff tear  arthropathy.   In the holding area, we completed a detailed informed consent with [name]  and her family, including her husband and son, explaining the pathoanatomy  of her rotator cuff avulsion.   They understand that the goal of surgery is to relieve pain and to prevent  the onset of a difficult arthritis and to preserve as much shoulder function  as possible.   Preoperatively she was noted have signs of osteopenia which may render  suture fixation challenging.  They  were advised of the routine risks and  benefits of anesthesia and surgery.  We will employ compression devices  during anesthesia in an effort to limit the risk of deep vein thrombosis.   PROCEDURE:  [name] is brought to the operating room and placed  in supine position on the operating table.   Following the induction of general endotracheal anesthesia under Dr.  Thornton Dales direct supervision, she was carefully positioned in the beach-  chair position with a aid of a torso and head holder designed for shoulder  arthroscopy.   Preoperatively, Dr. Gelene Mink had placed an interscalene block with  excellent results with satisfactory anesthesia of the left forequarter.   The left arm was prepped with DuraPrep and draped with impervious  arthroscopy drapes.  The scope was introduced through a standard posterior  viewing portal allowing  visualization of the humeral head and glenoid.  The  humeral head and areas of grade 2 chondromalacia and several irregular sites  noted centrally and posteriorly.  The subscapularis tendon was well-  visualized.  The long head biceps was partially degenerative but had a  stable anchor at the superior labrum.  The anterior glenohumeral ligaments  were sound.  There was moderate synovitis noted.   There was a necrotic chronic avulsion of the supraspinatus, infraspinatus  and acute avulsion of the teres minor with considerable retraction.   It appeared from the configuration of the tear that the supraspinatus was  likely chronic and the teres minor and infraspinatus were acute.   The scope was removed and we proceeded directly to open reconstruction of  the rotator cuff.   A 6 cm incision was fashioned from the [facility] joint across the anterior  acromion.  The anterior third of deltoid was elevated off the clavicle and  the acromion with a 15 scalpel blade and sharp osteotome, allowing exposure  of the coracoacromial ligament and bursa.  The coracoacromial ligament was  released from the anterior acromion and the deep surface and anterior  lateral lip of the acromion removed with an oscillating saw.  The acromion  was leveled to a type 1 morphology, taking care to remove bone medially with  a power bur.  An osteophyte at the [facility] joint was resected.   The distal 15 mm of clavicle was resected with the oscillating saw, followed  by debridement of hypertrophic bursa.   After bursal debridement, the cuff tear predicament was identified.  The  margins of the cuff were freshened.  The supraspinatus, infraspinatus and  teres minor were gathered with mattress sutures of #2 FiberWire and we were  able to advance to an anatomic insertion at the greater tuberosity.   A power bur was used to decorticate the greater tuberosity, identifying significant osteopenia.   A 6.5 mm bio-corkscrew anchor was  used posteriorly at some of the softest  bone to anchor the teres minor and the infraspinatus posterior aspect.  A  5.5 mm anchor was used at the junction of the supraspinatus and  infraspinatus.  Multiple through-bone sutures were used to advance the cuff  laterally, and a total of four mattress sutures were used to inset the  margin.  The sutures were then used with an over-the-top Velay-style push-  lock scheme with two far lateral cortical drill holes creating an excellent  Velay fixation of the supraspinatus and infraspinatus.   An excellent repair was achieved with a low profile to a satisfactory foot  print.   The deltoid was then  meticulously repaired by repair of the capsule of the  [facility] joint and closure of the dead space created by distal clavicle resection  with mattress suture of #2 FiberWire.  The deltoid was repaired to the  periosteum of the anterior acromion and the deltoid split laterally repaired  with mattress suture of 0 Vicryl.   The skin was repaired with subcutaneous suture of 2-0 Vicryl and intradermal  3-0 Prolene.   Ms. Smoot tolerated surgery and anesthesia well.  Passive compression pumps  were used throughout anesthesia on the calves for deep vein thrombosis  prophylaxis.   Ms. Crothers was awakened from general anesthesia and transferred to the  recovery room with stable vital signs.  We anticipate admission to the  recovery care center for observation of vital signs continuation of her  routine medications and appropriate analgesics in the form of IV PCA  morphine, p.o. and IV Dilaudid, as well as IV Ancef 1 g q.8h. p.r.n. pain.   There no apparent complications.      Katy Fitch Sypher, M.D.  Electronically Signed     RVS/MEDQ  D:  09/16/2005  T:  09/17/2005  Job:  [id number]   cc:   Ladell Pier, M.D.  Fax: [phone]   Lyn Records, M.D.  Fax: [phone]

## 2010-10-13 ENCOUNTER — Encounter (HOSPITAL_COMMUNITY): Payer: Self-pay

## 2010-10-14 ENCOUNTER — Encounter (HOSPITAL_COMMUNITY): Payer: Self-pay

## 2010-10-15 ENCOUNTER — Encounter (HOSPITAL_COMMUNITY): Payer: Self-pay

## 2010-10-20 ENCOUNTER — Encounter (HOSPITAL_COMMUNITY): Payer: Self-pay

## 2010-10-21 ENCOUNTER — Encounter (HOSPITAL_COMMUNITY): Payer: Self-pay

## 2010-10-22 ENCOUNTER — Encounter (HOSPITAL_COMMUNITY): Payer: Self-pay

## 2010-10-27 ENCOUNTER — Encounter (HOSPITAL_COMMUNITY): Payer: Self-pay | Attending: Interventional Cardiology

## 2010-10-27 DIAGNOSIS — I252 Old myocardial infarction: Secondary | ICD-10-CM | POA: Insufficient documentation

## 2010-10-27 DIAGNOSIS — E785 Hyperlipidemia, unspecified: Secondary | ICD-10-CM | POA: Insufficient documentation

## 2010-10-27 DIAGNOSIS — Z5189 Encounter for other specified aftercare: Secondary | ICD-10-CM | POA: Insufficient documentation

## 2010-10-27 DIAGNOSIS — I4729 Other ventricular tachycardia: Secondary | ICD-10-CM | POA: Insufficient documentation

## 2010-10-27 DIAGNOSIS — I472 Ventricular tachycardia, unspecified: Secondary | ICD-10-CM | POA: Insufficient documentation

## 2010-10-27 DIAGNOSIS — Z9861 Coronary angioplasty status: Secondary | ICD-10-CM | POA: Insufficient documentation

## 2010-10-27 DIAGNOSIS — I447 Left bundle-branch block, unspecified: Secondary | ICD-10-CM | POA: Insufficient documentation

## 2010-10-27 DIAGNOSIS — I959 Hypotension, unspecified: Secondary | ICD-10-CM | POA: Insufficient documentation

## 2010-10-27 DIAGNOSIS — I498 Other specified cardiac arrhythmias: Secondary | ICD-10-CM | POA: Insufficient documentation

## 2010-10-27 DIAGNOSIS — I2510 Atherosclerotic heart disease of native coronary artery without angina pectoris: Secondary | ICD-10-CM | POA: Insufficient documentation

## 2010-10-27 DIAGNOSIS — E876 Hypokalemia: Secondary | ICD-10-CM | POA: Insufficient documentation

## 2010-10-28 ENCOUNTER — Encounter (HOSPITAL_COMMUNITY): Payer: Self-pay

## 2010-10-29 ENCOUNTER — Encounter (HOSPITAL_COMMUNITY): Payer: Self-pay

## 2010-11-03 ENCOUNTER — Encounter (HOSPITAL_COMMUNITY): Payer: Self-pay

## 2010-11-04 ENCOUNTER — Encounter (HOSPITAL_COMMUNITY): Payer: Self-pay

## 2010-11-05 ENCOUNTER — Encounter (HOSPITAL_COMMUNITY): Payer: Self-pay

## 2010-11-10 ENCOUNTER — Encounter (HOSPITAL_COMMUNITY): Payer: Self-pay

## 2010-11-11 ENCOUNTER — Encounter (HOSPITAL_COMMUNITY): Payer: Self-pay

## 2010-11-12 ENCOUNTER — Encounter (HOSPITAL_COMMUNITY): Payer: Self-pay

## 2010-11-17 ENCOUNTER — Encounter (HOSPITAL_COMMUNITY): Payer: Self-pay

## 2010-11-18 ENCOUNTER — Encounter (HOSPITAL_COMMUNITY): Payer: Self-pay

## 2010-11-19 ENCOUNTER — Encounter (HOSPITAL_COMMUNITY): Payer: Self-pay

## 2010-11-24 ENCOUNTER — Encounter (HOSPITAL_COMMUNITY): Payer: Self-pay | Attending: Interventional Cardiology

## 2010-11-24 DIAGNOSIS — I252 Old myocardial infarction: Secondary | ICD-10-CM | POA: Insufficient documentation

## 2010-11-24 DIAGNOSIS — Z5189 Encounter for other specified aftercare: Secondary | ICD-10-CM | POA: Insufficient documentation

## 2010-11-24 DIAGNOSIS — I959 Hypotension, unspecified: Secondary | ICD-10-CM | POA: Insufficient documentation

## 2010-11-24 DIAGNOSIS — I472 Ventricular tachycardia, unspecified: Secondary | ICD-10-CM | POA: Insufficient documentation

## 2010-11-24 DIAGNOSIS — E876 Hypokalemia: Secondary | ICD-10-CM | POA: Insufficient documentation

## 2010-11-24 DIAGNOSIS — Z9861 Coronary angioplasty status: Secondary | ICD-10-CM | POA: Insufficient documentation

## 2010-11-24 DIAGNOSIS — I447 Left bundle-branch block, unspecified: Secondary | ICD-10-CM | POA: Insufficient documentation

## 2010-11-24 DIAGNOSIS — I4729 Other ventricular tachycardia: Secondary | ICD-10-CM | POA: Insufficient documentation

## 2010-11-24 DIAGNOSIS — I2510 Atherosclerotic heart disease of native coronary artery without angina pectoris: Secondary | ICD-10-CM | POA: Insufficient documentation

## 2010-11-24 DIAGNOSIS — I498 Other specified cardiac arrhythmias: Secondary | ICD-10-CM | POA: Insufficient documentation

## 2010-11-24 DIAGNOSIS — E785 Hyperlipidemia, unspecified: Secondary | ICD-10-CM | POA: Insufficient documentation

## 2010-11-25 ENCOUNTER — Encounter (HOSPITAL_COMMUNITY): Payer: Self-pay

## 2010-11-26 ENCOUNTER — Encounter (HOSPITAL_COMMUNITY): Payer: Self-pay

## 2010-12-01 ENCOUNTER — Encounter (HOSPITAL_COMMUNITY): Payer: Self-pay

## 2010-12-02 ENCOUNTER — Encounter (HOSPITAL_COMMUNITY): Payer: Self-pay

## 2010-12-03 ENCOUNTER — Encounter (HOSPITAL_COMMUNITY): Payer: Self-pay

## 2010-12-08 ENCOUNTER — Encounter (HOSPITAL_COMMUNITY): Payer: Self-pay

## 2010-12-09 ENCOUNTER — Encounter (HOSPITAL_COMMUNITY): Payer: Self-pay

## 2010-12-10 ENCOUNTER — Encounter (HOSPITAL_COMMUNITY): Payer: Self-pay

## 2010-12-15 ENCOUNTER — Encounter (HOSPITAL_COMMUNITY): Payer: Self-pay

## 2010-12-16 ENCOUNTER — Encounter (HOSPITAL_COMMUNITY): Payer: Self-pay

## 2010-12-17 ENCOUNTER — Encounter (HOSPITAL_COMMUNITY): Payer: Self-pay

## 2010-12-22 ENCOUNTER — Encounter (HOSPITAL_COMMUNITY): Payer: Self-pay

## 2010-12-23 ENCOUNTER — Encounter (HOSPITAL_COMMUNITY): Payer: Self-pay | Attending: Interventional Cardiology

## 2010-12-23 DIAGNOSIS — I4729 Other ventricular tachycardia: Secondary | ICD-10-CM | POA: Insufficient documentation

## 2010-12-23 DIAGNOSIS — Z5189 Encounter for other specified aftercare: Secondary | ICD-10-CM | POA: Insufficient documentation

## 2010-12-23 DIAGNOSIS — Z9861 Coronary angioplasty status: Secondary | ICD-10-CM | POA: Insufficient documentation

## 2010-12-23 DIAGNOSIS — I959 Hypotension, unspecified: Secondary | ICD-10-CM | POA: Insufficient documentation

## 2010-12-23 DIAGNOSIS — I472 Ventricular tachycardia, unspecified: Secondary | ICD-10-CM | POA: Insufficient documentation

## 2010-12-23 DIAGNOSIS — I2510 Atherosclerotic heart disease of native coronary artery without angina pectoris: Secondary | ICD-10-CM | POA: Insufficient documentation

## 2010-12-23 DIAGNOSIS — I252 Old myocardial infarction: Secondary | ICD-10-CM | POA: Insufficient documentation

## 2010-12-23 DIAGNOSIS — I498 Other specified cardiac arrhythmias: Secondary | ICD-10-CM | POA: Insufficient documentation

## 2010-12-23 DIAGNOSIS — E876 Hypokalemia: Secondary | ICD-10-CM | POA: Insufficient documentation

## 2010-12-23 DIAGNOSIS — E785 Hyperlipidemia, unspecified: Secondary | ICD-10-CM | POA: Insufficient documentation

## 2010-12-23 DIAGNOSIS — I447 Left bundle-branch block, unspecified: Secondary | ICD-10-CM | POA: Insufficient documentation

## 2010-12-24 ENCOUNTER — Encounter (HOSPITAL_COMMUNITY): Payer: Self-pay

## 2010-12-29 ENCOUNTER — Encounter (HOSPITAL_COMMUNITY): Payer: Self-pay

## 2010-12-30 ENCOUNTER — Encounter (HOSPITAL_COMMUNITY): Payer: Self-pay

## 2010-12-31 ENCOUNTER — Encounter (HOSPITAL_COMMUNITY): Payer: Self-pay

## 2011-01-05 ENCOUNTER — Encounter (HOSPITAL_COMMUNITY): Payer: Self-pay

## 2011-01-06 ENCOUNTER — Encounter (HOSPITAL_COMMUNITY): Payer: Self-pay

## 2011-01-07 ENCOUNTER — Encounter (HOSPITAL_COMMUNITY): Payer: Self-pay

## 2011-01-12 ENCOUNTER — Encounter (HOSPITAL_COMMUNITY): Payer: Self-pay

## 2011-01-13 ENCOUNTER — Encounter (HOSPITAL_COMMUNITY): Payer: Self-pay

## 2011-01-14 ENCOUNTER — Encounter (HOSPITAL_COMMUNITY): Payer: Self-pay

## 2011-01-19 ENCOUNTER — Encounter (HOSPITAL_COMMUNITY): Payer: Self-pay

## 2011-01-20 ENCOUNTER — Encounter (HOSPITAL_COMMUNITY): Payer: Self-pay

## 2011-01-21 ENCOUNTER — Encounter (HOSPITAL_COMMUNITY): Payer: Self-pay

## 2011-01-26 ENCOUNTER — Encounter (HOSPITAL_COMMUNITY): Payer: Self-pay | Attending: Interventional Cardiology

## 2011-01-26 DIAGNOSIS — I4729 Other ventricular tachycardia: Secondary | ICD-10-CM | POA: Insufficient documentation

## 2011-01-26 DIAGNOSIS — I447 Left bundle-branch block, unspecified: Secondary | ICD-10-CM | POA: Insufficient documentation

## 2011-01-26 DIAGNOSIS — I2510 Atherosclerotic heart disease of native coronary artery without angina pectoris: Secondary | ICD-10-CM | POA: Insufficient documentation

## 2011-01-26 DIAGNOSIS — E876 Hypokalemia: Secondary | ICD-10-CM | POA: Insufficient documentation

## 2011-01-26 DIAGNOSIS — I498 Other specified cardiac arrhythmias: Secondary | ICD-10-CM | POA: Insufficient documentation

## 2011-01-26 DIAGNOSIS — I472 Ventricular tachycardia, unspecified: Secondary | ICD-10-CM | POA: Insufficient documentation

## 2011-01-26 DIAGNOSIS — Z9861 Coronary angioplasty status: Secondary | ICD-10-CM | POA: Insufficient documentation

## 2011-01-26 DIAGNOSIS — E785 Hyperlipidemia, unspecified: Secondary | ICD-10-CM | POA: Insufficient documentation

## 2011-01-26 DIAGNOSIS — Z5189 Encounter for other specified aftercare: Secondary | ICD-10-CM | POA: Insufficient documentation

## 2011-01-26 DIAGNOSIS — I959 Hypotension, unspecified: Secondary | ICD-10-CM | POA: Insufficient documentation

## 2011-01-26 DIAGNOSIS — I252 Old myocardial infarction: Secondary | ICD-10-CM | POA: Insufficient documentation

## 2011-01-27 ENCOUNTER — Encounter (HOSPITAL_COMMUNITY): Payer: Self-pay

## 2011-01-28 ENCOUNTER — Encounter (HOSPITAL_COMMUNITY): Payer: Self-pay

## 2011-02-02 ENCOUNTER — Encounter (HOSPITAL_COMMUNITY): Payer: Self-pay

## 2011-02-03 ENCOUNTER — Encounter (HOSPITAL_COMMUNITY): Payer: Self-pay

## 2011-02-04 ENCOUNTER — Encounter (HOSPITAL_COMMUNITY): Payer: Self-pay

## 2011-02-09 ENCOUNTER — Encounter (HOSPITAL_COMMUNITY): Payer: Self-pay

## 2011-02-10 ENCOUNTER — Encounter (HOSPITAL_COMMUNITY): Payer: Self-pay

## 2011-02-11 ENCOUNTER — Encounter (HOSPITAL_COMMUNITY): Payer: Self-pay

## 2011-02-16 ENCOUNTER — Encounter (HOSPITAL_COMMUNITY): Payer: Self-pay

## 2011-02-17 ENCOUNTER — Encounter (HOSPITAL_COMMUNITY): Payer: Self-pay

## 2011-02-18 ENCOUNTER — Encounter (HOSPITAL_BASED_OUTPATIENT_CLINIC_OR_DEPARTMENT_OTHER): Payer: Medicare Other | Admitting: Oncology

## 2011-02-18 ENCOUNTER — Encounter (HOSPITAL_COMMUNITY): Payer: Self-pay

## 2011-02-18 DIAGNOSIS — D0590 Unspecified type of carcinoma in situ of unspecified breast: Secondary | ICD-10-CM

## 2011-02-18 DIAGNOSIS — Z170 Estrogen receptor positive status [ER+]: Secondary | ICD-10-CM

## 2011-02-23 ENCOUNTER — Encounter (HOSPITAL_COMMUNITY): Payer: Self-pay | Attending: Interventional Cardiology

## 2011-02-23 DIAGNOSIS — I4729 Other ventricular tachycardia: Secondary | ICD-10-CM | POA: Insufficient documentation

## 2011-02-23 DIAGNOSIS — I447 Left bundle-branch block, unspecified: Secondary | ICD-10-CM | POA: Insufficient documentation

## 2011-02-23 DIAGNOSIS — I472 Ventricular tachycardia, unspecified: Secondary | ICD-10-CM | POA: Insufficient documentation

## 2011-02-23 DIAGNOSIS — I252 Old myocardial infarction: Secondary | ICD-10-CM | POA: Insufficient documentation

## 2011-02-23 DIAGNOSIS — I959 Hypotension, unspecified: Secondary | ICD-10-CM | POA: Insufficient documentation

## 2011-02-23 DIAGNOSIS — E785 Hyperlipidemia, unspecified: Secondary | ICD-10-CM | POA: Insufficient documentation

## 2011-02-23 DIAGNOSIS — Z5189 Encounter for other specified aftercare: Secondary | ICD-10-CM | POA: Insufficient documentation

## 2011-02-23 DIAGNOSIS — I498 Other specified cardiac arrhythmias: Secondary | ICD-10-CM | POA: Insufficient documentation

## 2011-02-23 DIAGNOSIS — I2510 Atherosclerotic heart disease of native coronary artery without angina pectoris: Secondary | ICD-10-CM | POA: Insufficient documentation

## 2011-02-23 DIAGNOSIS — Z9861 Coronary angioplasty status: Secondary | ICD-10-CM | POA: Insufficient documentation

## 2011-02-23 DIAGNOSIS — E876 Hypokalemia: Secondary | ICD-10-CM | POA: Insufficient documentation

## 2011-02-24 ENCOUNTER — Encounter (HOSPITAL_COMMUNITY): Payer: Self-pay

## 2011-02-25 ENCOUNTER — Encounter (HOSPITAL_COMMUNITY): Payer: Self-pay

## 2011-03-02 ENCOUNTER — Encounter (HOSPITAL_COMMUNITY): Payer: Self-pay

## 2011-03-03 ENCOUNTER — Encounter (HOSPITAL_COMMUNITY): Payer: Self-pay

## 2011-03-04 ENCOUNTER — Encounter (HOSPITAL_COMMUNITY): Payer: Self-pay

## 2011-03-05 LAB — COMPREHENSIVE METABOLIC PANEL
ALT: 14
CO2: 26
Calcium: 9.1
Chloride: 98
Creatinine, Ser: 0.74
GFR calc non Af Amer: >60
Glucose, Bld: 96
Total Bilirubin: 0.5

## 2011-03-05 LAB — DIFFERENTIAL
Basophils Absolute: 0.1
Eosinophils Absolute: 0.2
Eosinophils Relative: 3
Lymphocytes Relative: 25
Lymphs Abs: 1.8
Neutrophils Relative %: 59

## 2011-03-05 LAB — CBC
Hemoglobin: 13.2
MCHC: 34.3
MCV: 90.4
RBC: 4.25
WBC: 7.1

## 2011-03-09 ENCOUNTER — Encounter (HOSPITAL_COMMUNITY): Payer: Self-pay

## 2011-03-10 ENCOUNTER — Encounter (HOSPITAL_COMMUNITY): Payer: Self-pay

## 2011-03-11 ENCOUNTER — Encounter (HOSPITAL_COMMUNITY): Payer: Self-pay

## 2011-03-16 ENCOUNTER — Encounter (HOSPITAL_COMMUNITY): Payer: Self-pay

## 2011-03-17 ENCOUNTER — Encounter (HOSPITAL_COMMUNITY): Payer: Self-pay

## 2011-03-18 ENCOUNTER — Encounter (HOSPITAL_COMMUNITY): Payer: Self-pay

## 2011-03-23 ENCOUNTER — Encounter (HOSPITAL_COMMUNITY): Payer: Self-pay

## 2011-03-24 ENCOUNTER — Encounter (HOSPITAL_COMMUNITY): Payer: Self-pay

## 2011-03-25 ENCOUNTER — Encounter (HOSPITAL_COMMUNITY): Payer: Self-pay

## 2011-03-25 DIAGNOSIS — I498 Other specified cardiac arrhythmias: Secondary | ICD-10-CM | POA: Insufficient documentation

## 2011-03-25 DIAGNOSIS — I252 Old myocardial infarction: Secondary | ICD-10-CM | POA: Insufficient documentation

## 2011-03-25 DIAGNOSIS — Z9861 Coronary angioplasty status: Secondary | ICD-10-CM | POA: Insufficient documentation

## 2011-03-25 DIAGNOSIS — I472 Ventricular tachycardia, unspecified: Secondary | ICD-10-CM | POA: Insufficient documentation

## 2011-03-25 DIAGNOSIS — E785 Hyperlipidemia, unspecified: Secondary | ICD-10-CM | POA: Insufficient documentation

## 2011-03-25 DIAGNOSIS — Z5189 Encounter for other specified aftercare: Secondary | ICD-10-CM | POA: Insufficient documentation

## 2011-03-25 DIAGNOSIS — I447 Left bundle-branch block, unspecified: Secondary | ICD-10-CM | POA: Insufficient documentation

## 2011-03-25 DIAGNOSIS — I2510 Atherosclerotic heart disease of native coronary artery without angina pectoris: Secondary | ICD-10-CM | POA: Insufficient documentation

## 2011-03-25 DIAGNOSIS — E876 Hypokalemia: Secondary | ICD-10-CM | POA: Insufficient documentation

## 2011-03-25 DIAGNOSIS — I4729 Other ventricular tachycardia: Secondary | ICD-10-CM | POA: Insufficient documentation

## 2011-03-25 DIAGNOSIS — I959 Hypotension, unspecified: Secondary | ICD-10-CM | POA: Insufficient documentation

## 2011-03-30 ENCOUNTER — Encounter (HOSPITAL_COMMUNITY): Payer: Self-pay

## 2011-03-31 ENCOUNTER — Encounter (HOSPITAL_COMMUNITY): Payer: Self-pay

## 2011-04-01 ENCOUNTER — Encounter (HOSPITAL_COMMUNITY): Payer: Self-pay

## 2011-04-06 ENCOUNTER — Encounter (HOSPITAL_COMMUNITY): Payer: Self-pay

## 2011-04-07 ENCOUNTER — Encounter (HOSPITAL_COMMUNITY): Payer: Self-pay

## 2011-04-08 ENCOUNTER — Encounter (HOSPITAL_COMMUNITY): Payer: Self-pay

## 2011-04-13 ENCOUNTER — Encounter (HOSPITAL_COMMUNITY)
Admission: RE | Admit: 2011-04-13 | Discharge: 2011-04-13 | Disposition: A | Discharge status: Home / Self Care | Payer: Self-pay | Source: Ambulatory Visit | Attending: Interventional Cardiology | Admitting: Interventional Cardiology

## 2011-04-14 ENCOUNTER — Encounter (HOSPITAL_COMMUNITY): Payer: Self-pay

## 2011-04-15 ENCOUNTER — Encounter (HOSPITAL_COMMUNITY): Payer: Self-pay

## 2011-04-20 ENCOUNTER — Encounter (HOSPITAL_COMMUNITY)
Admission: RE | Admit: 2011-04-20 | Discharge: 2011-04-20 | Disposition: A | Discharge status: Home / Self Care | Payer: Self-pay | Source: Ambulatory Visit | Attending: Interventional Cardiology | Admitting: Interventional Cardiology

## 2011-04-21 ENCOUNTER — Encounter (HOSPITAL_COMMUNITY): Payer: Self-pay

## 2011-04-22 ENCOUNTER — Encounter (HOSPITAL_COMMUNITY)
Admission: RE | Admit: 2011-04-22 | Discharge: 2011-04-22 | Disposition: A | Discharge status: Home / Self Care | Payer: Self-pay | Source: Ambulatory Visit | Attending: Interventional Cardiology | Admitting: Interventional Cardiology

## 2011-04-27 ENCOUNTER — Encounter (HOSPITAL_COMMUNITY)
Admission: RE | Admit: 2011-04-27 | Discharge: 2011-04-27 | Disposition: A | Discharge status: Home / Self Care | Payer: Self-pay | Source: Ambulatory Visit | Attending: Interventional Cardiology | Admitting: Interventional Cardiology

## 2011-04-27 DIAGNOSIS — I2510 Atherosclerotic heart disease of native coronary artery without angina pectoris: Secondary | ICD-10-CM | POA: Insufficient documentation

## 2011-04-27 DIAGNOSIS — Z5189 Encounter for other specified aftercare: Secondary | ICD-10-CM | POA: Insufficient documentation

## 2011-04-27 DIAGNOSIS — E785 Hyperlipidemia, unspecified: Secondary | ICD-10-CM | POA: Insufficient documentation

## 2011-04-27 DIAGNOSIS — I959 Hypotension, unspecified: Secondary | ICD-10-CM | POA: Insufficient documentation

## 2011-04-27 DIAGNOSIS — Z9861 Coronary angioplasty status: Secondary | ICD-10-CM | POA: Insufficient documentation

## 2011-04-27 DIAGNOSIS — I472 Ventricular tachycardia, unspecified: Secondary | ICD-10-CM | POA: Insufficient documentation

## 2011-04-27 DIAGNOSIS — I252 Old myocardial infarction: Secondary | ICD-10-CM | POA: Insufficient documentation

## 2011-04-27 DIAGNOSIS — I498 Other specified cardiac arrhythmias: Secondary | ICD-10-CM | POA: Insufficient documentation

## 2011-04-27 DIAGNOSIS — E876 Hypokalemia: Secondary | ICD-10-CM | POA: Insufficient documentation

## 2011-04-27 DIAGNOSIS — I4729 Other ventricular tachycardia: Secondary | ICD-10-CM | POA: Insufficient documentation

## 2011-04-27 DIAGNOSIS — I447 Left bundle-branch block, unspecified: Secondary | ICD-10-CM | POA: Insufficient documentation

## 2011-04-28 ENCOUNTER — Encounter (HOSPITAL_COMMUNITY): Payer: Self-pay

## 2011-04-29 ENCOUNTER — Telehealth: Payer: Self-pay | Admitting: *Deleted

## 2011-04-29 ENCOUNTER — Encounter (HOSPITAL_COMMUNITY)
Admission: RE | Admit: 2011-04-29 | Discharge: 2011-04-29 | Disposition: A | Discharge status: Home / Self Care | Payer: Self-pay | Source: Ambulatory Visit | Attending: Interventional Cardiology | Admitting: Interventional Cardiology

## 2011-04-29 NOTE — Telephone Encounter (Signed)
left voice message to inform the patient of the new date and time on 08-05-2011 starting at 10:30am with labs

## 2011-05-04 ENCOUNTER — Encounter (HOSPITAL_COMMUNITY)
Admission: RE | Admit: 2011-05-04 | Discharge: 2011-05-04 | Disposition: A | Discharge status: Home / Self Care | Payer: Self-pay | Source: Ambulatory Visit | Attending: Interventional Cardiology | Admitting: Interventional Cardiology

## 2011-05-05 ENCOUNTER — Encounter (HOSPITAL_COMMUNITY)
Admission: RE | Admit: 2011-05-05 | Discharge: 2011-05-05 | Disposition: A | Discharge status: Home / Self Care | Payer: Self-pay | Source: Ambulatory Visit | Attending: Interventional Cardiology | Admitting: Interventional Cardiology

## 2011-05-06 ENCOUNTER — Encounter (HOSPITAL_COMMUNITY)
Admission: RE | Admit: 2011-05-06 | Discharge: 2011-05-06 | Disposition: A | Discharge status: Home / Self Care | Payer: Self-pay | Source: Ambulatory Visit | Attending: Interventional Cardiology | Admitting: Interventional Cardiology

## 2011-05-11 ENCOUNTER — Encounter (HOSPITAL_COMMUNITY)
Admission: RE | Admit: 2011-05-11 | Discharge: 2011-05-11 | Disposition: A | Discharge status: Home / Self Care | Payer: Self-pay | Source: Ambulatory Visit | Attending: Interventional Cardiology | Admitting: Interventional Cardiology

## 2011-05-12 ENCOUNTER — Encounter (HOSPITAL_COMMUNITY): Payer: Self-pay

## 2011-05-13 ENCOUNTER — Encounter (HOSPITAL_COMMUNITY): Payer: Self-pay

## 2011-05-18 ENCOUNTER — Encounter (HOSPITAL_COMMUNITY): Payer: Self-pay

## 2011-05-19 ENCOUNTER — Encounter (HOSPITAL_COMMUNITY): Payer: Self-pay

## 2011-05-20 ENCOUNTER — Encounter (HOSPITAL_COMMUNITY): Payer: Self-pay

## 2011-05-25 ENCOUNTER — Encounter (HOSPITAL_COMMUNITY): Payer: Self-pay

## 2011-05-26 ENCOUNTER — Encounter (HOSPITAL_COMMUNITY): Payer: Self-pay

## 2011-05-27 ENCOUNTER — Encounter (HOSPITAL_COMMUNITY): Payer: Self-pay

## 2011-05-27 DIAGNOSIS — I959 Hypotension, unspecified: Secondary | ICD-10-CM | POA: Insufficient documentation

## 2011-05-27 DIAGNOSIS — Z5189 Encounter for other specified aftercare: Secondary | ICD-10-CM | POA: Insufficient documentation

## 2011-05-27 DIAGNOSIS — E876 Hypokalemia: Secondary | ICD-10-CM | POA: Insufficient documentation

## 2011-05-27 DIAGNOSIS — I4729 Other ventricular tachycardia: Secondary | ICD-10-CM | POA: Insufficient documentation

## 2011-05-27 DIAGNOSIS — E785 Hyperlipidemia, unspecified: Secondary | ICD-10-CM | POA: Insufficient documentation

## 2011-05-27 DIAGNOSIS — I2510 Atherosclerotic heart disease of native coronary artery without angina pectoris: Secondary | ICD-10-CM | POA: Insufficient documentation

## 2011-05-27 DIAGNOSIS — I498 Other specified cardiac arrhythmias: Secondary | ICD-10-CM | POA: Insufficient documentation

## 2011-05-27 DIAGNOSIS — I252 Old myocardial infarction: Secondary | ICD-10-CM | POA: Insufficient documentation

## 2011-05-27 DIAGNOSIS — I472 Ventricular tachycardia, unspecified: Secondary | ICD-10-CM | POA: Insufficient documentation

## 2011-05-27 DIAGNOSIS — Z9861 Coronary angioplasty status: Secondary | ICD-10-CM | POA: Insufficient documentation

## 2011-05-27 DIAGNOSIS — I447 Left bundle-branch block, unspecified: Secondary | ICD-10-CM | POA: Insufficient documentation

## 2011-05-31 DIAGNOSIS — E782 Mixed hyperlipidemia: Secondary | ICD-10-CM | POA: Diagnosis not present

## 2011-05-31 DIAGNOSIS — E669 Obesity, unspecified: Secondary | ICD-10-CM | POA: Diagnosis not present

## 2011-05-31 DIAGNOSIS — I10 Essential (primary) hypertension: Secondary | ICD-10-CM | POA: Diagnosis not present

## 2011-06-01 ENCOUNTER — Encounter (HOSPITAL_COMMUNITY)
Admission: RE | Admit: 2011-06-01 | Discharge: 2011-06-01 | Disposition: A | Discharge status: Home / Self Care | Payer: Self-pay | Source: Ambulatory Visit | Attending: Interventional Cardiology | Admitting: Interventional Cardiology

## 2011-06-02 ENCOUNTER — Encounter (HOSPITAL_COMMUNITY)
Admission: RE | Admit: 2011-06-02 | Discharge: 2011-06-02 | Disposition: A | Discharge status: Home / Self Care | Payer: Self-pay | Source: Ambulatory Visit | Attending: Interventional Cardiology | Admitting: Interventional Cardiology

## 2011-06-03 ENCOUNTER — Encounter (HOSPITAL_COMMUNITY)
Admission: RE | Admit: 2011-06-03 | Discharge: 2011-06-03 | Disposition: A | Discharge status: Home / Self Care | Payer: Self-pay | Source: Ambulatory Visit | Attending: Interventional Cardiology | Admitting: Interventional Cardiology

## 2011-06-04 DIAGNOSIS — H3530 Unspecified macular degeneration: Secondary | ICD-10-CM | POA: Diagnosis not present

## 2011-06-05 ENCOUNTER — Other Ambulatory Visit: Payer: Self-pay

## 2011-06-05 ENCOUNTER — Emergency Department (HOSPITAL_COMMUNITY): Payer: Medicare Other

## 2011-06-05 ENCOUNTER — Inpatient Hospital Stay (HOSPITAL_COMMUNITY)
Admission: EM | Admit: 2011-06-05 | Discharge: 2011-06-14 | DRG: 470 | Disposition: A | Discharge status: Skilled Nursing Facility | Payer: Medicare Other | Attending: Internal Medicine | Admitting: Internal Medicine

## 2011-06-05 ENCOUNTER — Encounter (HOSPITAL_COMMUNITY): Payer: Self-pay

## 2011-06-05 DIAGNOSIS — D62 Acute posthemorrhagic anemia: Secondary | ICD-10-CM | POA: Diagnosis not present

## 2011-06-05 DIAGNOSIS — M79609 Pain in unspecified limb: Secondary | ICD-10-CM | POA: Diagnosis not present

## 2011-06-05 DIAGNOSIS — J9819 Other pulmonary collapse: Secondary | ICD-10-CM | POA: Diagnosis not present

## 2011-06-05 DIAGNOSIS — S72019A Unspecified intracapsular fracture of unspecified femur, initial encounter for closed fracture: Secondary | ICD-10-CM

## 2011-06-05 DIAGNOSIS — Y92009 Unspecified place in unspecified non-institutional (private) residence as the place of occurrence of the external cause: Secondary | ICD-10-CM

## 2011-06-05 DIAGNOSIS — S72009A Fracture of unspecified part of neck of unspecified femur, initial encounter for closed fracture: Secondary | ICD-10-CM | POA: Diagnosis not present

## 2011-06-05 DIAGNOSIS — I10 Essential (primary) hypertension: Secondary | ICD-10-CM | POA: Diagnosis not present

## 2011-06-05 DIAGNOSIS — S79929A Unspecified injury of unspecified thigh, initial encounter: Secondary | ICD-10-CM | POA: Diagnosis not present

## 2011-06-05 DIAGNOSIS — S4980XA Other specified injuries of shoulder and upper arm, unspecified arm, initial encounter: Secondary | ICD-10-CM | POA: Diagnosis not present

## 2011-06-05 DIAGNOSIS — S42293A Other displaced fracture of upper end of unspecified humerus, initial encounter for closed fracture: Secondary | ICD-10-CM | POA: Diagnosis present

## 2011-06-05 DIAGNOSIS — I2510 Atherosclerotic heart disease of native coronary artery without angina pectoris: Secondary | ICD-10-CM | POA: Diagnosis present

## 2011-06-05 DIAGNOSIS — Z9289 Personal history of other medical treatment: Secondary | ICD-10-CM

## 2011-06-05 DIAGNOSIS — E871 Hypo-osmolality and hyponatremia: Secondary | ICD-10-CM | POA: Diagnosis not present

## 2011-06-05 DIAGNOSIS — S72043A Displaced fracture of base of neck of unspecified femur, initial encounter for closed fracture: Secondary | ICD-10-CM | POA: Diagnosis not present

## 2011-06-05 DIAGNOSIS — Z9861 Coronary angioplasty status: Secondary | ICD-10-CM | POA: Diagnosis not present

## 2011-06-05 DIAGNOSIS — Z043 Encounter for examination and observation following other accident: Secondary | ICD-10-CM | POA: Diagnosis not present

## 2011-06-05 DIAGNOSIS — S72033A Displaced midcervical fracture of unspecified femur, initial encounter for closed fracture: Principal | ICD-10-CM | POA: Diagnosis present

## 2011-06-05 DIAGNOSIS — S42291A Other displaced fracture of upper end of right humerus, initial encounter for closed fracture: Secondary | ICD-10-CM | POA: Diagnosis present

## 2011-06-05 DIAGNOSIS — D649 Anemia, unspecified: Secondary | ICD-10-CM | POA: Diagnosis not present

## 2011-06-05 DIAGNOSIS — S42213A Unspecified displaced fracture of surgical neck of unspecified humerus, initial encounter for closed fracture: Secondary | ICD-10-CM | POA: Diagnosis not present

## 2011-06-05 DIAGNOSIS — I252 Old myocardial infarction: Secondary | ICD-10-CM | POA: Diagnosis not present

## 2011-06-05 DIAGNOSIS — K5900 Constipation, unspecified: Secondary | ICD-10-CM | POA: Diagnosis not present

## 2011-06-05 DIAGNOSIS — R509 Fever, unspecified: Secondary | ICD-10-CM | POA: Diagnosis not present

## 2011-06-05 DIAGNOSIS — S72009D Fracture of unspecified part of neck of unspecified femur, subsequent encounter for closed fracture with routine healing: Secondary | ICD-10-CM | POA: Diagnosis not present

## 2011-06-05 DIAGNOSIS — S72001A Fracture of unspecified part of neck of right femur, initial encounter for closed fracture: Secondary | ICD-10-CM | POA: Diagnosis present

## 2011-06-05 DIAGNOSIS — Z5189 Encounter for other specified aftercare: Secondary | ICD-10-CM | POA: Diagnosis not present

## 2011-06-05 DIAGNOSIS — S40011A Contusion of right shoulder, initial encounter: Secondary | ICD-10-CM

## 2011-06-05 DIAGNOSIS — R5383 Other fatigue: Secondary | ICD-10-CM | POA: Diagnosis not present

## 2011-06-05 DIAGNOSIS — S42209A Unspecified fracture of upper end of unspecified humerus, initial encounter for closed fracture: Secondary | ICD-10-CM | POA: Diagnosis not present

## 2011-06-05 DIAGNOSIS — M25559 Pain in unspecified hip: Secondary | ICD-10-CM | POA: Diagnosis not present

## 2011-06-05 DIAGNOSIS — S40019A Contusion of unspecified shoulder, initial encounter: Secondary | ICD-10-CM | POA: Diagnosis not present

## 2011-06-05 DIAGNOSIS — R52 Pain, unspecified: Secondary | ICD-10-CM | POA: Diagnosis not present

## 2011-06-05 DIAGNOSIS — Z9181 History of falling: Secondary | ICD-10-CM | POA: Diagnosis not present

## 2011-06-05 DIAGNOSIS — S52599A Other fractures of lower end of unspecified radius, initial encounter for closed fracture: Secondary | ICD-10-CM | POA: Diagnosis not present

## 2011-06-05 DIAGNOSIS — S46909A Unspecified injury of unspecified muscle, fascia and tendon at shoulder and upper arm level, unspecified arm, initial encounter: Secondary | ICD-10-CM | POA: Diagnosis not present

## 2011-06-05 DIAGNOSIS — W010XXA Fall on same level from slipping, tripping and stumbling without subsequent striking against object, initial encounter: Secondary | ICD-10-CM | POA: Diagnosis present

## 2011-06-05 DIAGNOSIS — J90 Pleural effusion, not elsewhere classified: Secondary | ICD-10-CM | POA: Diagnosis not present

## 2011-06-05 HISTORY — DX: Acute myocardial infarction, unspecified: I21.9

## 2011-06-05 HISTORY — DX: Essential (primary) hypertension: I10

## 2011-06-05 LAB — BASIC METABOLIC PANEL
BUN: 14 mg/dL (ref 6–23)
CO2: 23 mEq/L (ref 19–32)
Calcium: 9.1 mg/dL (ref 8.4–10.5)
Chloride: 95 mEq/L — ABNORMAL LOW (ref 96–112)
Creatinine, Ser: 0.68 mg/dL (ref 0.50–1.10)
GFR calc Af Amer: >90 mL/min (ref >90)
GFR calc non Af Amer: 80 mL/min — ABNORMAL LOW (ref >90)
Glucose, Bld: 114 mg/dL — ABNORMAL HIGH (ref 70–99)
Potassium: 3.8 mEq/L (ref 3.5–5.1)
Sodium: 129 mEq/L — ABNORMAL LOW (ref 135–145)

## 2011-06-05 LAB — CBC
HCT: 39.6 % (ref 36.0–46.0)
Hemoglobin: 13.5 g/dL (ref 12.0–15.0)
MCH: 30.4 pg (ref 26.0–34.0)
MCHC: 34.1 g/dL (ref 30.0–36.0)
MCV: 89.2 fL (ref 78.0–100.0)
Platelets: 228 10*3/uL (ref 150–400)
RBC: 4.44 MIL/uL (ref 3.87–5.11)
RDW: 12.3 % (ref 11.5–15.5)
WBC: 9.3 10*3/uL (ref 4.0–10.5)

## 2011-06-05 LAB — URINALYSIS, ROUTINE W REFLEX MICROSCOPIC
Bilirubin Urine: NEGATIVE
Glucose, UA: NEGATIVE mg/dL
Hgb urine dipstick: NEGATIVE
Ketones, ur: NEGATIVE mg/dL
Leukocytes, UA: NEGATIVE
Nitrite: NEGATIVE
Protein, ur: NEGATIVE mg/dL
Specific Gravity, Urine: 1.011 (ref 1.005–1.030)
Urobilinogen, UA: 1 mg/dL (ref 0.0–1.0)
pH: 7.5 (ref 5.0–8.0)

## 2011-06-05 LAB — TYPE AND SCREEN
ABO/RH(D): O POS
Antibody Screen: NEGATIVE

## 2011-06-05 MED ORDER — SODIUM CHLORIDE 0.9 % IV SOLN
INTRAVENOUS | Status: DC
  Administered 2011-06-08 – 2011-06-09 (×3): via INTRAVENOUS

## 2011-06-05 MED ORDER — EZETIMIBE 10 MG PO TABS
10.0000 mg | ORAL_TABLET | Freq: Every evening | ORAL | Status: DC
  Administered 2011-06-05 – 2011-06-13 (×9): 10 mg via ORAL
  Filled 2011-06-05 (×11): qty 1

## 2011-06-05 MED ORDER — VITAMIN D3 25 MCG (1000 UNIT) PO TABS
1000.0000 [IU] | ORAL_TABLET | Freq: Every day | ORAL | Status: DC
  Administered 2011-06-05 – 2011-06-13 (×9): 1000 [IU] via ORAL
  Filled 2011-06-05 (×10): qty 1

## 2011-06-05 MED ORDER — MORPHINE SULFATE 4 MG/ML IJ SOLN
INTRAMUSCULAR | Status: AC
  Filled 2011-06-05: qty 1

## 2011-06-05 MED ORDER — HEPARIN SODIUM (PORCINE) 5000 UNIT/ML IJ SOLN
5000.0000 [IU] | Freq: Three times a day (TID) | INTRAMUSCULAR | Status: DC
  Administered 2011-06-05: 5000 [IU] via SUBCUTANEOUS
  Filled 2011-06-05 (×5): qty 1

## 2011-06-05 MED ORDER — ROSUVASTATIN CALCIUM 5 MG PO TABS
5.0000 mg | ORAL_TABLET | ORAL | Status: DC
  Administered 2011-06-10: 5 mg via ORAL
  Filled 2011-06-05 (×3): qty 1

## 2011-06-05 MED ORDER — LISINOPRIL 20 MG PO TABS
20.0000 mg | ORAL_TABLET | Freq: Every day | ORAL | Status: DC
  Filled 2011-06-05 (×2): qty 1

## 2011-06-05 MED ORDER — MORPHINE SULFATE 2 MG/ML IJ SOLN
INTRAMUSCULAR | Status: AC
  Filled 2011-06-05: qty 1

## 2011-06-05 MED ORDER — METOPROLOL SUCCINATE ER 25 MG PO TB24
25.0000 mg | ORAL_TABLET | Freq: Every evening | ORAL | Status: DC
  Administered 2011-06-05: 25 mg via ORAL
  Filled 2011-06-05 (×2): qty 1

## 2011-06-05 MED ORDER — MORPHINE SULFATE 2 MG/ML IJ SOLN
2.0000 mg | INTRAMUSCULAR | Status: DC | PRN
  Administered 2011-06-05 – 2011-06-06 (×2): 2 mg via INTRAVENOUS
  Filled 2011-06-05 (×2): qty 1

## 2011-06-05 MED ORDER — OCUVITE-LUTEIN PO CAPS
1.0000 | ORAL_CAPSULE | Freq: Two times a day (BID) | ORAL | Status: DC
  Administered 2011-06-05: 1 via ORAL
  Filled 2011-06-05 (×3): qty 1

## 2011-06-05 MED ORDER — SODIUM CHLORIDE 0.9 % IJ SOLN
3.0000 mL | Freq: Two times a day (BID) | INTRAMUSCULAR | Status: DC
  Administered 2011-06-06 – 2011-06-09 (×6): 3 mL via INTRAVENOUS

## 2011-06-05 MED ORDER — MORPHINE SULFATE 4 MG/ML IJ SOLN
6.0000 mg | Freq: Once | INTRAMUSCULAR | Status: AC
  Administered 2011-06-05: 6 mg via INTRAVENOUS

## 2011-06-05 NOTE — ED Provider Notes (Signed)
History     CSN: [id number]  Arrival date & time 06/05/11  1207   First MD Initiated Contact with Patient 06/05/11 1219      Chief Complaint  Patient presents with  . Fall     Patient is a 78 y.o. female presenting with fall. The history is provided by the patient.  Fall  Patient fell this morning while at a family member's home. She tripped over a dog and fell forward "sliding" and tried to catch herself on her right side. She landed on her right shoulder and hip. She is complaining now of pain in her right hip and shoulder and immobility. Prior to this fall, patient reports that she was able to get around well with no limitations. She has fell before requiring surgery on her right wrist. PMH includes an MI in 2008 with stent placement and breast cancer with lumpectomy. She states that she has had a bone density scan in the past that revealed osteopenia.    Past Medical History  Diagnosis Date  . Hypertension   . Myocardial infarction     Past Surgical History  Procedure Date  . Coronary angioplasty with stent placement   . Shoulder surgery   . Breast lumpectomy   . Wrist surgery     History reviewed. No pertinent family history.  History  Substance Use Topics  . Smoking status: Never Smoker   . Smokeless tobacco: Not on file  . Alcohol Use: No    OB History    Grav Para Term Preterm Abortions TAB SAB Ect Mult Living                  Review of Systems All pertinent positives and negatives reviewed in the history of present illness  Allergies  Codeine  Home Medications   Current Outpatient Rx  Name Route Sig Dispense Refill  . ASPIRIN 81 MG PO TABS Oral Take 160 mg by mouth daily.    Marland Kitchen CLOPIDOGREL BISULFATE 75 MG PO TABS Oral Take 75 mg by mouth daily.    Marland Kitchen EZETIMIBE 10 MG PO TABS Oral Take 10 mg by mouth daily.    Marland Kitchen LISINOPRIL 10 MG PO TABS Oral Take 10 mg by mouth daily.    Marland Kitchen METOPROLOL SUCCINATE ER 25 MG PO TB24 Oral Take 25 mg by mouth daily.    Marland Kitchen  ROSUVASTATIN CALCIUM 10 MG PO TABS Oral Take 10 mg by mouth daily.      BP 179/64  Pulse 68  Temp(Src) 97.7 F (36.5 C) (Oral)  Resp 20  SpO2 98%  Physical Exam  Constitutional: She is oriented to person, place, and time. She appears well-developed and well-nourished. No distress.  Cardiovascular: Normal rate, regular rhythm and normal heart sounds.   Pulmonary/Chest: Effort normal and breath sounds normal. No respiratory distress.  Musculoskeletal:       Right shoulder: She exhibits decreased range of motion, pain and decreased strength.       Right hip: She exhibits decreased range of motion, decreased strength and tenderness.       Arms:      Legs:      Pain with passive flexion and abduction of the R hip. Patient is unable to flex or abduct the R shoulder. Patient is able to move her right hand, wrist, ankle, and foot without pain.  Neurological: She is alert and oriented to person, place, and time.  Skin: Skin is warm and dry. She is not diaphoretic.  ED Course  Procedures (including critical care time)   Labs Reviewed  CBC  BASIC METABOLIC PANEL  URINALYSIS, ROUTINE W REFLEX MICROSCOPIC       MDM    Date: 06/05/2011  Rate: 70  Rhythm: normal sinus rhythm  QRS Axis: normal  Intervals: normal  ST/T Wave abnormalities: nonspecific ST changes  Conduction Disutrbances:none  Narrative Interpretation:   Old EKG Reviewed: unchanged  Spoke with Dr. Rennis Chris and he will be in to see patient.            Carlyle Dolly, PA-C 06/05/11 1526  Carlyle Dolly, PA-C 06/06/11 0800

## 2011-06-05 NOTE — ED Notes (Signed)
Pt only received 3mg  Morphine

## 2011-06-05 NOTE — ED Notes (Signed)
Company secretary notified

## 2011-06-05 NOTE — ED Notes (Signed)
Patient has an allergy to cloro-trimeton.  Unable to locate on the allergy list.

## 2011-06-05 NOTE — Consult Note (Signed)
Reason for Consult:Right femoral neck fracturt Referring Physician: [place]  HPI: [name] is an 78 year old female who was brought to the hospital after patient tripped in the kitchen this morning hitting her right shoulder as well as right hip. X-ray shoulder did not show any fracture but x-ray of the hip shows subcapital femoral neck fracture. Patient does have a history of MI and had a bare-metal stent placed in 2008. Patient is currently on aspirin and Plavix. She denies any chest pain does not have dyspnea on exertion patient is able to climb one flight of stairs without any complaints. Patient has good function capacity. She denies any fever no nausea vomiting or diarrhea she did not pass out did not have blurred vision no seizures. Has been followed by Dr. Darnelle Catalan for her Hx of breast CA.   Past Medical History  Diagnosis Date  . Hypertension   . Myocardial infarction     Past Surgical History  Procedure Date  . Coronary angioplasty with stent placement   . Shoulder surgery   . Breast lumpectomy   . Wrist surgery     History reviewed. No pertinent family history.  Social History:  reports that she has never smoked. She does not have any smokeless tobacco history on file. She reports that she does not drink alcohol. Her drug history not on file.  Allergies:  Allergies  Allergen Reactions  . Chlorpheniramine Anaphylaxis  . Codeine Anaphylaxis    Medications: I have reviewed the patient's current medications.  Results for orders placed during the hospital encounter of 06/05/11 (from the past 48 hour(s))  CBC     Status: Normal   Collection Time   06/05/11 12:45 PM      Component Value Range Comment   WBC 9.3  4.0 - 10.5 (K/uL)    RBC 4.44  3.87 - 5.11 (MIL/uL)    Hemoglobin 13.5  12.0 - 15.0 (g/dL)    HCT 16.1  09.6 - 04.5 (%)    MCV 89.2  78.0 - 100.0 (fL)    MCH 30.4  26.0 - 34.0 (pg)    MCHC 34.1  30.0 - 36.0 (g/dL)    RDW 40.9  81.1 - 91.4 (%)    Platelets  228  150 - 400 (K/uL)   BASIC METABOLIC PANEL     Status: Abnormal   Collection Time   06/05/11 12:45 PM      Component Value Range Comment   Sodium 129 (*) 135 - 145 (mEq/L)    Potassium 3.8  3.5 - 5.1 (mEq/L)    Chloride 95 (*) 96 - 112 (mEq/L)    CO2 23  19 - 32 (mEq/L)    Glucose, Bld 114 (*) 70 - 99 (mg/dL)    BUN 14  6 - 23 (mg/dL)    Creatinine, Ser 7.82  0.50 - 1.10 (mg/dL)    Calcium 9.1  8.4 - 10.5 (mg/dL)    GFR calc non Af Amer 80 (*) >90 (mL/min)    GFR calc Af Amer >90  >90 (mL/min)   URINALYSIS, ROUTINE W REFLEX MICROSCOPIC     Status: Normal   Collection Time   06/05/11  1:14 PM      Component Value Range Comment   Color, Urine YELLOW  YELLOW     APPearance CLEAR  CLEAR     Specific Gravity, Urine 1.011  1.005 - 1.030     pH 7.5  5.0 - 8.0     Glucose, UA NEGATIVE  NEGATIVE (mg/dL)    Hgb urine dipstick NEGATIVE  NEGATIVE     Bilirubin Urine NEGATIVE  NEGATIVE     Ketones, ur NEGATIVE  NEGATIVE (mg/dL)    Protein, ur NEGATIVE  NEGATIVE (mg/dL)    Urobilinogen, UA 1.0  0.0 - 1.0 (mg/dL)    Nitrite NEGATIVE  NEGATIVE     Leukocytes, UA NEGATIVE  NEGATIVE  MICROSCOPIC NOT DONE ON URINES WITH NEGATIVE PROTEIN, BLOOD, LEUKOCYTES, NITRITE, OR GLUCOSE <1000 mg/dL.  TYPE AND SCREEN     Status: Normal   Collection Time   06/05/11  3:00 PM      Component Value Range Comment   ABO/RH(D) O POS      Antibody Screen NEG      Sample Expiration 06/08/2011     ABO/RH     Status: Normal   Collection Time   06/05/11  3:00 PM      Component Value Range Comment   ABO/RH(D) O POS       Dg Chest 1 View  06/05/2011  *RADIOLOGY REPORT*  Clinical Data: Status post fall on the right side of the chest today.  CHEST - 1 VIEW  Comparison: PA and lateral chest 01/30/2007.  Findings: There is some coarsening of the pulmonary interstitium, unchanged.  No focal airspace disease or effusion.  Heart size upper normal.  No acute bony abnormality.  Postoperative change left shoulder noted.   IMPRESSION: No acute finding.  Original Report Authenticated By: Bernadene Bell. Maricela Curet, M.D.   Dg Shoulder Right  06/05/2011  *RADIOLOGY REPORT*  Clinical Data: Fall on the right side.  RIGHT SHOULDER - 2+ VIEW  Comparison: Chest x-ray 01/30/2007  Findings: Degenerative changes are seen in the shoulder.  There is no evidence for acute fracture or subluxation.  The right lung apex is unremarkable in appearance.  Bones appear demineralized.  IMPRESSION:  1.  Degenerate changes. 2. No evidence for acute  abnormality.  Original Report Authenticated By: Patterson Hammersmith, M.D.   Dg Hip Complete Right  06/05/2011  *RADIOLOGY REPORT*  Clinical Data: Fall.  Injury of the right hip.  RIGHT HIP - COMPLETE 2+ VIEW  Comparison: None.  Findings: Views of the right hip include an AP view of the pelvis. There is an acute subcapital femoral neck fracture, associated with varus angulation and impaction at the fracture site.  There is significant lucency at the fracture site, raising the question of pathologic fracture.  Is there history of malignancy?  Bones generally appear radiolucent.  There is degenerative changes lower lumbar spine.  IMPRESSION: Subcapital femoral neck fracture.  Consider possibility of pathologic fracture.  Original Report Authenticated By: Patterson Hammersmith, M.D.      Physical Exam: WDWF A and O, [place]/AT. Neck Beverlie Kurihara and nontender. Right shoulder diffusely tender, intact to light touch sensation axillary nerve distribution. Weakness with testing of external rotators. No gross bone/joint instability but gaurded with attempts at r shoulder motion. N/V intact distally. LUE stable, N/V intact. R hip with diffuse tenderness, pain with attempts at hip motion. LLE no bone or joint instability. N/V intact distally BLE. Vitals Temp:  [97.7 F (36.5 C)] 97.7 F (36.5 C) (01/12 1218) Pulse Rate:  [68-73] 73  (01/12 1513) Resp:  [18-20] 18  (01/12 1513) BP: (161-179)/(58-64) 161/58 mmHg (01/12  1513) SpO2:  [93 %-98 %] 93 % (01/12 1513)  Assessment/Plan: Impression: R femoral neck fracture, ?pathologic Treatment: I have discussed with [name] and her family treatment options/risks/benefits. Will tentatively plan  for surgery tomorrow and I will with speak with Dr. Lequita Halt regarding management options. NPO after midnight. All questions encouraged and answered.  Lawson Isabell M 06/05/2011, 4:58 PM

## 2011-06-05 NOTE — H&P (Signed)
PCP:   No primary provider on file.   Chief Complaint:  Right hip pain, status post fall  HPI: 78 year old female who was brought to the hospital after patient tripped in the kitchen this morning hitting her right shoulder as well as right hip. X-ray shoulder did not show any fracture but x-ray of the hip shows subcapital femoral neck fracture. Patient does have a history of MI and had a bare-metal stent placed in 2008. Patient is currently on aspirin and Plavix. She denies any chest pain does not have dyspnea on exertion patient is able to climb one flight of stairs without any complaints. Patient has good function capacity. She denies any fever no nausea vomiting or diarrhea she did not pass out did not have blurred vision no seizures.  Allergies:   Allergies  Allergen Reactions  . Chlorpheniramine Anaphylaxis  . Codeine Anaphylaxis      Past Medical History  Diagnosis Date  . Hypertension   . Myocardial infarction     Past Surgical History  Procedure Date  . Coronary angioplasty with stent placement   . Shoulder surgery   . Breast lumpectomy   . Wrist surgery     Prior to Admission medications   Medication Sig Start Date End Date Taking? Authorizing Provider  aspirin 81 MG tablet Take 81-160 mg by mouth daily. Depending on bruising   Yes Historical Provider, MD  cholecalciferol (VITAMIN D) 1000 UNITS tablet Take 1,000 Units by mouth at bedtime.   Yes Historical Provider, MD  clopidogrel (PLAVIX) 75 MG tablet Take 75 mg by mouth daily.   Yes Historical Provider, MD  Coenzyme Q10 (COQ10) 200 MG CAPS Take 1 tablet by mouth daily.   Yes Historical Provider, MD  ezetimibe (ZETIA) 10 MG tablet Take 10 mg by mouth every evening.    Yes Historical Provider, MD  lisinopril (PRINIVIL,ZESTRIL) 20 MG tablet Take 20 mg by mouth daily.   Yes Historical Provider, MD  lisinopril (PRINIVIL,ZESTRIL) 20 MG tablet Take 20 mg by mouth daily.   Yes Historical Provider, MD  metoprolol succinate  (TOPROL-XL) 25 MG 24 hr tablet Take 25 mg by mouth every evening.    Yes Historical Provider, MD  Multiple Vitamins-Minerals (PRESERVISION/LUTEIN) CAPS Take 1 capsule by mouth 2 (two) times daily.   Yes Historical Provider, MD  rosuvastatin (CRESTOR) 10 MG tablet Take 5 mg by mouth 2 (two) times a week. Tuesday and thursday   Yes Historical Provider, MD    Social History:  reports that she has never smoked. She does not have any smokeless tobacco history on file. She reports that she does not drink alcohol. Her drug history not on file.  History reviewed. No pertinent family history.  Review of Systems:  Constitutional: Denies fever, chills, diaphoresis, appetite change and fatigue.  HEENT: Denies photophobia, eye pain, redness, hearing loss, ear pain, Respiratory: Denies SOB, DOE, cough, chest tightness,  and wheezing.   Cardiovascular: Denies chest pain, palpitations and leg swelling. No dyspnea on exertion. Gastrointestinal: Denies nausea, vomiting, abdominal pain, diarrhea, constipation, blood in stool and abdominal distention.  Genitourinary: Denies dysuria, urgency, frequency, hematuria, flank pain and difficulty urinating.  Musculoskeletal: Right hip pain after fall. Neurological: Denies dizziness, seizures, syncope, weakness, light-headedness, numbness and headaches.      Physical Exam: Blood pressure 161/58, pulse 73, temperature 97.7 F (36.5 C), temperature source Oral, resp. rate 18, SpO2 93.00%. Constitutional: Vital signs reviewed.  Patient is a well-developed and well-nourished female  in no acute distress and  cooperative with exam. Alert and oriented x3.  Head: Normocephalic and atraumatic Mouth: no erythema or exudates, MMM Eyes: PERRL, EOMI, conjunctivae normal, No scleral icterus.  Neck: Supple, Trachea midline normal ROM, No JVD, mass, thyromegaly, or carotid bruit present.  Cardiovascular: RRR, S1 normal, S2 normal, no MRG, pulses symmetric and intact  bilaterally Pulmonary/Chest: CTAB, no wheezes, rales, or rhonchi Abdominal: Soft. Non-tender, non-distended, bowel sounds are normal, no masses, organomegaly, or guarding present.  Musculoskeletal:Right lower extremity pain, hip pain on movement. Right Shoulder: ROM restricted due to pain. Neurological: A&O x3, Strenght is normal and symmetric bilaterally, cranial nerve II-XII are grossly intact,  Skin: Warm, dry and intact. No rash, cyanosis, or clubbing.    Labs on Admission:  Results for orders placed during the hospital encounter of 06/05/11 (from the past 48 hour(s))  CBC     Status: Normal   Collection Time   06/05/11 12:45 PM      Component Value Range Comment   WBC 9.3  4.0 - 10.5 (K/uL)    RBC 4.44  3.87 - 5.11 (MIL/uL)    Hemoglobin 13.5  12.0 - 15.0 (g/dL)    HCT 16.1  09.6 - 04.5 (%)    MCV 89.2  78.0 - 100.0 (fL)    MCH 30.4  26.0 - 34.0 (pg)    MCHC 34.1  30.0 - 36.0 (g/dL)    RDW 40.9  81.1 - 91.4 (%)    Platelets 228  150 - 400 (K/uL)   BASIC METABOLIC PANEL     Status: Abnormal   Collection Time   06/05/11 12:45 PM      Component Value Range Comment   Sodium 129 (*) 135 - 145 (mEq/L)    Potassium 3.8  3.5 - 5.1 (mEq/L)    Chloride 95 (*) 96 - 112 (mEq/L)    CO2 23  19 - 32 (mEq/L)    Glucose, Bld 114 (*) 70 - 99 (mg/dL)    BUN 14  6 - 23 (mg/dL)    Creatinine, Ser 7.82  0.50 - 1.10 (mg/dL)    Calcium 9.1  8.4 - 10.5 (mg/dL)    GFR calc non Af Amer 80 (*) >90 (mL/min)    GFR calc Af Amer >90  >90 (mL/min)   URINALYSIS, ROUTINE W REFLEX MICROSCOPIC     Status: Normal   Collection Time   06/05/11  1:14 PM      Component Value Range Comment   Color, Urine YELLOW  YELLOW     APPearance CLEAR  CLEAR     Specific Gravity, Urine 1.011  1.005 - 1.030     pH 7.5  5.0 - 8.0     Glucose, UA NEGATIVE  NEGATIVE (mg/dL)    Hgb urine dipstick NEGATIVE  NEGATIVE     Bilirubin Urine NEGATIVE  NEGATIVE     Ketones, ur NEGATIVE  NEGATIVE (mg/dL)    Protein, ur NEGATIVE   NEGATIVE (mg/dL)    Urobilinogen, UA 1.0  0.0 - 1.0 (mg/dL)    Nitrite NEGATIVE  NEGATIVE     Leukocytes, UA NEGATIVE  NEGATIVE  MICROSCOPIC NOT DONE ON URINES WITH NEGATIVE PROTEIN, BLOOD, LEUKOCYTES, NITRITE, OR GLUCOSE <1000 mg/dL.  TYPE AND SCREEN     Status: Normal (Preliminary result)   Collection Time   06/05/11  3:00 PM      Component Value Range Comment   ABO/RH(D) PENDING      Antibody Screen PENDING      Sample Expiration 06/08/2011  Radiological Exams on Admission: Dg Chest 1 View  06/05/2011  *RADIOLOGY REPORT*  Clinical Data: Status post fall on the right side of the chest today.  CHEST - 1 VIEW  Comparison: PA and lateral chest 01/30/2007.  Findings: There is some coarsening of the pulmonary interstitium, unchanged.  No focal airspace disease or effusion.  Heart size upper normal.  No acute bony abnormality.  Postoperative change left shoulder noted.  IMPRESSION: No acute finding.  Original Report Authenticated By: Bernadene Bell. Maricela Curet, M.D.   Dg Shoulder Right  06/05/2011  *RADIOLOGY REPORT*  Clinical Data: Fall on the right side.  RIGHT SHOULDER - 2+ VIEW  Comparison: Chest x-ray 01/30/2007  Findings: Degenerative changes are seen in the shoulder.  There is no evidence for acute fracture or subluxation.  The right lung apex is unremarkable in appearance.  Bones appear demineralized.  IMPRESSION:  1.  Degenerate changes. 2. No evidence for acute  abnormality.  Original Report Authenticated By: Patterson Hammersmith, M.D.   Dg Hip Complete Right  06/05/2011  *RADIOLOGY REPORT*  Clinical Data: Fall.  Injury of the right hip.  RIGHT HIP - COMPLETE 2+ VIEW  Comparison: None.  Findings: Views of the right hip include an AP view of the pelvis. There is an acute subcapital femoral neck fracture, associated with varus angulation and impaction at the fracture site.  There is significant lucency at the fracture site, raising the question of pathologic fracture.  Is there history of  malignancy?  Bones generally appear radiolucent.  There is degenerative changes lower lumbar spine.  IMPRESSION: Subcapital femoral neck fracture.  Consider possibility of pathologic fracture.  Original Report Authenticated By: Patterson Hammersmith, M.D.    Assessment/Plan  Time Spent on Admission: Subcapital femoral neck fracture Dr. Rennis Chris will see the patient, and plan is for surgery tomorrow. Will continue patient on morphine 2 mg IV every 4 hours when necessary for pain.  CAD Patient has a history of MI with bare-metal stent. Patient has good function capacity, denies any dyspnea on exertion. Her EKG shows normal sinus rhythm with no significant ST-T abnormalities. I called and discussed withDr. Gala Romney, who is covering for [facility] cardiology on Saturday , and he recommends to stop aspirin and Plavix at this time and he recommends to start only aspirin 81 mg by mouth daily after the surgery. At this time patient is moderate risk for surgery given her age and history of coronary artery disease.  Hypertension Will continue patient on lisinopril and metoprolol, Crestor.  DVT prophylaxis Heparin  Tavaris Eudy S [facility] Pager: [phone] 06/05/2011, 4:06 PM

## 2011-06-05 NOTE — ED Notes (Signed)
Becky, NS paged ortho

## 2011-06-05 NOTE — ED Notes (Signed)
Patient tripped over family dog, fell, landing on right shoulder, patient reporting right hip and shoulder pain. EMS reporting right hip shortening and rotation.  Patient denies LOC and is alert and orientedx3.

## 2011-06-06 ENCOUNTER — Encounter (HOSPITAL_COMMUNITY): Payer: Self-pay | Admitting: Anesthesiology

## 2011-06-06 ENCOUNTER — Encounter (HOSPITAL_COMMUNITY)
Admission: EM | Disposition: A | Discharge status: Skilled Nursing Facility | Payer: Self-pay | Source: Home / Self Care | Attending: Internal Medicine

## 2011-06-06 ENCOUNTER — Encounter (HOSPITAL_COMMUNITY): Payer: Self-pay | Admitting: Orthopedic Surgery

## 2011-06-06 ENCOUNTER — Inpatient Hospital Stay (HOSPITAL_COMMUNITY): Payer: Medicare Other | Admitting: Anesthesiology

## 2011-06-06 ENCOUNTER — Other Ambulatory Visit: Payer: Self-pay | Admitting: Orthopedic Surgery

## 2011-06-06 HISTORY — PX: HIP ARTHROPLASTY: SHX981

## 2011-06-06 LAB — COMPREHENSIVE METABOLIC PANEL
ALT: 11 U/L (ref 0–35)
Albumin: 2.9 g/dL — ABNORMAL LOW (ref 3.5–5.2)
Alkaline Phosphatase: 75 U/L (ref 39–117)
Chloride: 95 mEq/L — ABNORMAL LOW (ref 96–112)
Potassium: 3.5 mEq/L (ref 3.5–5.1)
Sodium: 126 mEq/L — ABNORMAL LOW (ref 135–145)
Total Bilirubin: 0.6 mg/dL (ref 0.3–1.2)
Total Protein: 5.7 g/dL — ABNORMAL LOW (ref 6.0–8.3)

## 2011-06-06 LAB — TYPE AND SCREEN
ABO/RH(D): O POS
Antibody Screen: NEGATIVE
Unit division: 0

## 2011-06-06 LAB — CBC
HCT: 33.5 % — ABNORMAL LOW (ref 36.0–46.0)
MCH: 29.9 pg (ref 26.0–34.0)
MCHC: 34 g/dL (ref 30.0–36.0)
MCV: 87.9 fL (ref 78.0–100.0)
RDW: 12.3 % (ref 11.5–15.5)

## 2011-06-06 LAB — GLUCOSE, CAPILLARY: Glucose-Capillary: 110 mg/dL — ABNORMAL HIGH (ref 70–99)

## 2011-06-06 SURGERY — HEMIARTHROPLASTY, HIP, DIRECT ANTERIOR APPROACH, FOR FRACTURE
Anesthesia: General | Site: Hip | Laterality: Right | Wound class: Clean

## 2011-06-06 MED ORDER — DOCUSATE SODIUM 100 MG PO CAPS
100.0000 mg | ORAL_CAPSULE | Freq: Two times a day (BID) | ORAL | Status: DC
  Administered 2011-06-06 – 2011-06-14 (×16): 100 mg via ORAL
  Filled 2011-06-06 (×18): qty 1

## 2011-06-06 MED ORDER — MORPHINE SULFATE 2 MG/ML IJ SOLN
INTRAMUSCULAR | Status: AC
  Administered 2011-06-06: 2 mg via INTRAVENOUS
  Filled 2011-06-06: qty 1

## 2011-06-06 MED ORDER — METOPROLOL SUCCINATE ER 25 MG PO TB24
25.0000 mg | ORAL_TABLET | Freq: Every day | ORAL | Status: DC
  Administered 2011-06-06: 25 mg via ORAL
  Filled 2011-06-06 (×2): qty 1

## 2011-06-06 MED ORDER — BUPIVACAINE LIPOSOME 1.3 % IJ SUSP
20.0000 mL | Freq: Once | INTRAMUSCULAR | Status: DC
  Filled 2011-06-06: qty 20

## 2011-06-06 MED ORDER — CEFAZOLIN SODIUM 1-5 GM-% IV SOLN
1.0000 g | Freq: Four times a day (QID) | INTRAVENOUS | Status: AC
  Administered 2011-06-06 – 2011-06-07 (×3): 1 g via INTRAVENOUS
  Filled 2011-06-06 (×3): qty 50

## 2011-06-06 MED ORDER — CEFAZOLIN SODIUM-DEXTROSE 2-3 GM-% IV SOLR
2.0000 g | Freq: Once | INTRAVENOUS | Status: AC
  Administered 2011-06-06: 2 g via INTRAVENOUS

## 2011-06-06 MED ORDER — POLYETHYLENE GLYCOL 3350 17 G PO PACK
17.0000 g | PACK | Freq: Every day | ORAL | Status: DC | PRN
  Filled 2011-06-06: qty 1

## 2011-06-06 MED ORDER — BISACODYL 10 MG RE SUPP: 10.0000 mg | Freq: Every day | RECTAL | Status: DC | PRN

## 2011-06-06 MED ORDER — MEPERIDINE HCL 50 MG/ML IJ SOLN: 6.2500 mg | INTRAMUSCULAR | Status: DC | PRN

## 2011-06-06 MED ORDER — SUCCINYLCHOLINE CHLORIDE 20 MG/ML IJ SOLN
INTRAMUSCULAR | Status: DC | PRN
  Administered 2011-06-06: 100 mg via INTRAVENOUS

## 2011-06-06 MED ORDER — HETASTARCH-ELECTROLYTES 6 % IV SOLN
INTRAVENOUS | Status: DC | PRN
  Administered 2011-06-06: 12:00:00 via INTRAVENOUS

## 2011-06-06 MED ORDER — ENOXAPARIN SODIUM 40 MG/0.4ML ~~LOC~~ SOLN
40.0000 mg | SUBCUTANEOUS | Status: DC
  Administered 2011-06-07 – 2011-06-14 (×8): 40 mg via SUBCUTANEOUS
  Filled 2011-06-06 (×9): qty 0.4

## 2011-06-06 MED ORDER — SODIUM CHLORIDE 0.9 % IV SOLN: 250.0000 mL | INTRAVENOUS | Status: DC | PRN

## 2011-06-06 MED ORDER — TRAMADOL HCL 50 MG PO TABS
50.0000 mg | ORAL_TABLET | Freq: Four times a day (QID) | ORAL | Status: DC | PRN
  Administered 2011-06-11 – 2011-06-14 (×8): 50 mg via ORAL
  Filled 2011-06-06 (×8): qty 1

## 2011-06-06 MED ORDER — MENTHOL 3 MG MT LOZG
1.0000 | LOZENGE | OROMUCOSAL | Status: DC | PRN
  Administered 2011-06-06 – 2011-06-07 (×2): 3 mg via ORAL
  Filled 2011-06-06: qty 9

## 2011-06-06 MED ORDER — EPHEDRINE SULFATE 50 MG/ML IJ SOLN
INTRAMUSCULAR | Status: DC | PRN
  Administered 2011-06-06: 5 mg via INTRAVENOUS
  Administered 2011-06-06 (×3): 10 mg via INTRAVENOUS

## 2011-06-06 MED ORDER — LIDOCAINE HCL (CARDIAC) 20 MG/ML IV SOLN
INTRAVENOUS | Status: DC | PRN
  Administered 2011-06-06: 40 mg via INTRAVENOUS

## 2011-06-06 MED ORDER — PROPOFOL 10 MG/ML IV EMUL
INTRAVENOUS | Status: DC | PRN
  Administered 2011-06-06: 140 mg via INTRAVENOUS

## 2011-06-06 MED ORDER — LACTATED RINGERS IV SOLN
INTRAVENOUS | Status: DC | PRN
  Administered 2011-06-06: 11:00:00 via INTRAVENOUS

## 2011-06-06 MED ORDER — SODIUM CHLORIDE 0.9 % IV SOLN
INTRAVENOUS | Status: DC | PRN
  Administered 2011-06-06: 10:00:00 via INTRAVENOUS

## 2011-06-06 MED ORDER — FLEET ENEMA 7-19 GM/118ML RE ENEM: 1.0000 | ENEMA | Freq: Once | RECTAL | Status: AC | PRN

## 2011-06-06 MED ORDER — ONDANSETRON HCL 4 MG/2ML IJ SOLN: 4.0000 mg | Freq: Four times a day (QID) | INTRAMUSCULAR | Status: DC | PRN

## 2011-06-06 MED ORDER — MORPHINE SULFATE 2 MG/ML IJ SOLN
2.0000 mg | INTRAMUSCULAR | Status: DC | PRN
  Administered 2011-06-06: 2 mg via INTRAVENOUS

## 2011-06-06 MED ORDER — SODIUM CHLORIDE 0.9 % IV SOLN
INTRAVENOUS | Status: DC
  Administered 2011-06-06: 17:00:00 via INTRAVENOUS

## 2011-06-06 MED ORDER — HYDROMORPHONE HCL PF 1 MG/ML IJ SOLN: 0.2500 mg | INTRAMUSCULAR | Status: DC | PRN

## 2011-06-06 MED ORDER — LACTATED RINGERS IV SOLN: INTRAVENOUS | Status: DC

## 2011-06-06 MED ORDER — LISINOPRIL 20 MG PO TABS
20.0000 mg | ORAL_TABLET | Freq: Every day | ORAL | Status: DC
  Administered 2011-06-06 – 2011-06-14 (×9): 20 mg via ORAL
  Filled 2011-06-06 (×10): qty 1

## 2011-06-06 MED ORDER — METHOCARBAMOL 500 MG PO TABS
500.0000 mg | ORAL_TABLET | Freq: Four times a day (QID) | ORAL | Status: DC | PRN
  Administered 2011-06-07: 500 mg via ORAL
  Filled 2011-06-06: qty 1

## 2011-06-06 MED ORDER — MORPHINE SULFATE 2 MG/ML IJ SOLN
1.0000 mg | INTRAMUSCULAR | Status: DC | PRN
  Administered 2011-06-06 – 2011-06-10 (×29): 2 mg via INTRAVENOUS
  Filled 2011-06-06 (×10): qty 1
  Filled 2011-06-06: qty 2
  Filled 2011-06-06 (×17): qty 1

## 2011-06-06 MED ORDER — METOCLOPRAMIDE HCL 5 MG/ML IJ SOLN
5.0000 mg | Freq: Three times a day (TID) | INTRAMUSCULAR | Status: DC | PRN
Start: 2011-06-06 — End: 2011-06-14

## 2011-06-06 MED ORDER — OCUVITE-LUTEIN PO CAPS
1.0000 | ORAL_CAPSULE | Freq: Two times a day (BID) | ORAL | Status: DC
  Administered 2011-06-06 – 2011-06-14 (×16): 1 via ORAL
  Filled 2011-06-06 (×20): qty 1

## 2011-06-06 MED ORDER — PROMETHAZINE HCL 25 MG/ML IJ SOLN: 6.2500 mg | INTRAMUSCULAR | Status: DC | PRN

## 2011-06-06 MED ORDER — METHOCARBAMOL 100 MG/ML IJ SOLN
500.0000 mg | Freq: Four times a day (QID) | INTRAVENOUS | Status: DC | PRN
  Filled 2011-06-06: qty 5

## 2011-06-06 MED ORDER — SODIUM CHLORIDE 0.9 % IJ SOLN: 3.0000 mL | INTRAMUSCULAR | Status: DC | PRN

## 2011-06-06 MED ORDER — ONDANSETRON HCL 4 MG PO TABS: 4.0000 mg | ORAL_TABLET | Freq: Four times a day (QID) | ORAL | Status: DC | PRN

## 2011-06-06 MED ORDER — ACETAMINOPHEN 325 MG PO TABS
650.0000 mg | ORAL_TABLET | Freq: Four times a day (QID) | ORAL | Status: DC | PRN
  Administered 2011-06-07 – 2011-06-09 (×2): 650 mg via ORAL
  Filled 2011-06-06 (×2): qty 2

## 2011-06-06 MED ORDER — PHENOL 1.4 % MT LIQD: 1.0000 | OROMUCOSAL | Status: DC | PRN

## 2011-06-06 MED ORDER — METOCLOPRAMIDE HCL 5 MG PO TABS
5.0000 mg | ORAL_TABLET | Freq: Three times a day (TID) | ORAL | Status: DC | PRN
  Filled 2011-06-06: qty 2

## 2011-06-06 MED ORDER — ONDANSETRON HCL 4 MG/2ML IJ SOLN
INTRAMUSCULAR | Status: DC | PRN
  Administered 2011-06-06 (×2): 2 mg via INTRAVENOUS

## 2011-06-06 MED ORDER — BUPIVACAINE LIPOSOME 1.3 % IJ SUSP
INTRAMUSCULAR | Status: DC | PRN
  Administered 2011-06-06: 20 mL

## 2011-06-06 MED ORDER — ACETAMINOPHEN 650 MG RE SUPP: 650.0000 mg | Freq: Four times a day (QID) | RECTAL | Status: DC | PRN

## 2011-06-06 MED ORDER — ACETAMINOPHEN 10 MG/ML IV SOLN
INTRAVENOUS | Status: DC | PRN
  Administered 2011-06-06: 1000 mg via INTRAVENOUS

## 2011-06-06 MED ORDER — FENTANYL CITRATE 0.05 MG/ML IJ SOLN
INTRAMUSCULAR | Status: DC | PRN
  Administered 2011-06-06: 100 ug via INTRAVENOUS

## 2011-06-06 MED ORDER — HYDROMORPHONE HCL PF 1 MG/ML IJ SOLN
INTRAMUSCULAR | Status: DC | PRN
  Administered 2011-06-06: .1 mg via INTRAVENOUS
  Administered 2011-06-06: .4 mg via INTRAVENOUS

## 2011-06-06 SURGICAL SUPPLY — 47 items
BAG SPEC THK2 15X12 ZIP CLS (MISCELLANEOUS) ×1
BAG ZIPLOCK 12X15 (MISCELLANEOUS) ×2 IMPLANT
BIT DRILL 2.8X128 (BIT) ×2 IMPLANT
BLADE SAW SAG 73X25 THK (BLADE) ×1
BLADE SAW SGTL 73X25 THK (BLADE) ×1 IMPLANT
BRUSH FEMORAL CANAL (MISCELLANEOUS) ×2 IMPLANT
CEMENT BONE DEPUY (Cement) ×4 IMPLANT
CEMENT RESTRICTOR DEPUY SZ 4 (Cement) ×1 IMPLANT
CLOTH BEACON ORANGE TIMEOUT ST (SAFETY) ×2 IMPLANT
DRAPE INCISE IOBAN 66X45 STRL (DRAPES) ×2 IMPLANT
DRAPE ORTHO SPLIT 77X108 STRL (DRAPES) ×2
DRAPE POUCH INSTRU U-SHP 10X18 (DRAPES) ×2 IMPLANT
DRAPE SURG ORHT 6 SPLT 77X108 (DRAPES) ×1 IMPLANT
DRAPE U-SHAPE 47X51 STRL (DRAPES) ×2 IMPLANT
DRSG EMULSION OIL 3X16 NADH (GAUZE/BANDAGES/DRESSINGS) ×2 IMPLANT
DRSG MEPILEX BORDER 4X4 (GAUZE/BANDAGES/DRESSINGS) ×2 IMPLANT
DRSG MEPILEX BORDER 4X8 (GAUZE/BANDAGES/DRESSINGS) ×2 IMPLANT
DRSG PAD ABDOMINAL 8X10 ST (GAUZE/BANDAGES/DRESSINGS) ×2 IMPLANT
DURAPREP 26ML APPLICATOR (WOUND CARE) ×2 IMPLANT
ELECT REM PT RETURN 9FT ADLT (ELECTROSURGICAL) ×2
ELECTRODE REM PT RTRN 9FT ADLT (ELECTROSURGICAL) ×1 IMPLANT
EVACUATOR 1/8 PVC DRAIN (DRAIN) ×2 IMPLANT
FACESHIELD LNG OPTICON STERILE (SAFETY) ×6 IMPLANT
GLOVE BIO SURGEON STRL SZ7.5 (GLOVE) ×2 IMPLANT
GLOVE BIO SURGEON STRL SZ8 (GLOVE) ×4 IMPLANT
GOWN STRL NON-REIN LRG LVL3 (GOWN DISPOSABLE) ×2 IMPLANT
GOWN STRL REIN XL XLG (GOWN DISPOSABLE) ×2 IMPLANT
HANDPIECE INTERPULSE COAX TIP (DISPOSABLE) ×2
IMMOBILIZER KNEE 20 (SOFTGOODS)
IMMOBILIZER KNEE 20 THIGH 36 (SOFTGOODS) IMPLANT
KIT BASIN OR (CUSTOM PROCEDURE TRAY) ×2 IMPLANT
MANIFOLD NEPTUNE II (INSTRUMENTS) ×2 IMPLANT
PACK TOTAL JOINT (CUSTOM PROCEDURE TRAY) ×2 IMPLANT
PASSER SUT SWANSON 36MM LOOP (INSTRUMENTS) ×2 IMPLANT
POSITIONER SURGICAL ARM (MISCELLANEOUS) ×2 IMPLANT
PRESSURIZER FEMORAL UNIV (MISCELLANEOUS) ×2 IMPLANT
SET HNDPC FAN SPRY TIP SCT (DISPOSABLE) ×1 IMPLANT
SPONGE GAUZE 4X4 12PLY (GAUZE/BANDAGES/DRESSINGS) ×4 IMPLANT
STRIP CLOSURE SKIN 1/2X4 (GAUZE/BANDAGES/DRESSINGS) ×2 IMPLANT
SUT ETHIBOND NAB CT1 #1 30IN (SUTURE) ×4 IMPLANT
SUT MNCRL AB 4-0 PS2 18 (SUTURE) ×2 IMPLANT
SUT VIC AB 1 CT1 27 (SUTURE) ×6
SUT VIC AB 1 CT1 27XBRD ANTBC (SUTURE) ×3 IMPLANT
SUT VIC AB 2-0 CT1 27 (SUTURE) ×6
SUT VIC AB 2-0 CT1 TAPERPNT 27 (SUTURE) ×3 IMPLANT
TOWEL OR 17X26 10 PK STRL BLUE (TOWEL DISPOSABLE) ×4 IMPLANT
TOWER CARTRIDGE SMART MIX (DISPOSABLE) ×2 IMPLANT

## 2011-06-06 NOTE — Brief Op Note (Signed)
06/05/2011 - 06/06/2011  12:20 PM  PATIENT:  [name]  78 y.o. female  PRE-OPERATIVE DIAGNOSIS:  fracture hip right  POST-OPERATIVE DIAGNOSIS:  fractured right hip   PROCEDURE:  Procedure(s): ARTHROPLASTY BIPOLAR HIP-Right  SURGEON:  Surgeon(s): Gus Rankin Anaria Kroner  PHYSICIAN ASSISTANT:   ASSISTANTS: Ralene Bathe, PA-C   ANESTHESIA:   general  EBL:  Total I/O In: -  Out: 333 [Urine:33; Blood:300]  BLOOD ADMINISTERED:none  DRAINS: (medium) Hemovact drain(s) in the right hip with  Suction Open   LOCAL MEDICATIONS USED:  Exparel 20 ml mixed with Saline 50 CC   SPECIMEN:  Source of Specimen:  Right femoral head  DISPOSITION OF SPECIMEN:  PATHOLOGY  COUNTS:  YES  TOURNIQUET:  * No tourniquets in log *  DICTATION: .Other Dictation: Dictation Number [id number]  PLAN OF CARE: Admit to inpatient   PATIENT DISPOSITION:  PACU - hemodynamically stable.   Gus Rankin Rachel Moller, MD    06/06/2011, 12:26 PM

## 2011-06-06 NOTE — Anesthesia Postprocedure Evaluation (Signed)
  Anesthesia Post-op Note  Patient: [name]  Procedure(s) Performed:  ARTHROPLASTY BIPOLAR HIP  Patient Location: PACU  Anesthesia Type: General  Level of Consciousness: awake and alert   Airway and Oxygen Therapy: Patient Spontanous Breathing  Post-op Pain: mild  Post-op Assessment: Post-op Vital signs reviewed, Patient's Cardiovascular Status Stable, Respiratory Function Stable, Patent Airway and No signs of Nausea or vomiting  Post-op Vital Signs: stable  Complications: No apparent anesthesia complications

## 2011-06-06 NOTE — Transfer of Care (Signed)
Immediate Anesthesia Transfer of Care Note  Patient: [name]  Procedure(s) Performed:  ARTHROPLASTY BIPOLAR HIP  Patient Location: PACU  Anesthesia Type: General  Level of Consciousness: awake and alert   Airway & Oxygen Therapy: Patient Spontanous Breathing and Patient connected to face mask  Post-op Assessment: Report given to PACU RN and Post -op Vital signs reviewed and stable  Post vital signs: Reviewed and stable Filed Vitals:   06/06/11 0515  BP: 140/55  Pulse: 78  Temp: 37.2 C  Resp: 18    Complications: No apparent anesthesia complications

## 2011-06-06 NOTE — Interval H&P Note (Signed)
History and Physical Interval Note:  06/06/2011 10:21 AM  [name]  has presented today for surgery, with the diagnosis of fracture hip right  The various methods of treatment have been discussed with the patient. After consideration of risks, benefits and other options for treatment, the patient has consented to  Procedure(s): ARTHROPLASTY BIPOLAR HIP as a surgical intervention .  The patients' history has been reviewed, patient examined, no change in status, stable for surgery.  I have reviewed the patients' chart and labs.  Questions were answered to the patient's satisfaction.     Loanne Drilling

## 2011-06-06 NOTE — Anesthesia Preprocedure Evaluation (Signed)
Anesthesia Evaluation  Patient identified by MRN, date of birth, ID band Patient awake    Reviewed: Allergy & Precautions, H&P , NPO status , Patient's Chart, lab work & pertinent test results  Airway Mallampati: II TM Distance: >3 FB Neck ROM: Full    Dental No notable dental hx.    Pulmonary neg pulmonary ROS,  clear to auscultation  Pulmonary exam normal       Cardiovascular hypertension, Pt. on medications + Past MI (s/p stent 2008) and neg cardio ROS Regular Normal    Neuro/Psych Negative Neurological ROS  Negative Psych ROS   GI/Hepatic negative GI ROS, Neg liver ROS,   Endo/Other  Negative Endocrine ROSDiabetes mellitus-  Renal/GU negative Renal ROS  Genitourinary negative   Musculoskeletal negative musculoskeletal ROS (+)   Abdominal   Peds negative pediatric ROS (+)  Hematology negative hematology ROS (+)   Anesthesia Other Findings No sticks or BP on Left arm.  Reproductive/Obstetrics negative OB ROS                           Anesthesia Physical Anesthesia Plan  ASA: III  Anesthesia Plan: General   Post-op Pain Management:    Induction: Intravenous  Airway Management Planned: Oral ETT  Additional Equipment:   Intra-op Plan:   Post-operative Plan: Extubation in OR  Informed Consent: I have reviewed the patients History and Physical, chart, labs and discussed the procedure including the risks, benefits and alternatives for the proposed anesthesia with the patient or authorized representative who has indicated his/her understanding and acceptance.   Dental advisory given  Plan Discussed with:   Anesthesia Plan Comments:         Anesthesia Quick Evaluation

## 2011-06-06 NOTE — Progress Notes (Signed)
Subjective: Patient seen and examined ,C/O right hip pain ,denies any chest pain or SOB.Awaiting  surgery   Objective: Vital signs in last 24 hours: Temp:  [97.7 F (36.5 C)-98.9 F (37.2 C)] 98.9 F (37.2 C) (01/13 0515) Pulse Rate:  [68-78] 78  (01/13 0515) Resp:  [18-20] 18  (01/13 0515) BP: (140-179)/(55-64) 140/55 mmHg (01/13 0515) SpO2:  [93 %-98 %] 96 % (01/13 0515) Weight:  [92.9 kg (204 lb 12.9 oz)] 92.9 kg (204 lb 12.9 oz) (01/12 2101) Weight change:  Last BM Date: 06/05/11  Intake/Output from previous day: 01/12 0701 - 01/13 0700 In: -  Out: 1400 [Urine:1400]     Physical Exam: General: Alert, awake, oriented x3, in MILD acute distress. Heart: Regular rate and rhythm, without murmurs, rubs, gallops. Lungs: Clear to auscultation bilaterally. Abdomen: Soft, nontender, nondistended, positive bowel sounds. Extremities: No clubbing cyanosis or edema with positive pedal pulses.Right shoulder/right hip tenderness and LROM Neuro: Grossly intact, nonfocal.    Lab Results: Results for orders placed during the hospital encounter of 06/05/11 (from the past 24 hour(s))  CBC     Status: Normal   Collection Time   06/05/11 12:45 PM      Component Value Range   WBC 9.3  4.0 - 10.5 (K/uL)   RBC 4.44  3.87 - 5.11 (MIL/uL)   Hemoglobin 13.5  12.0 - 15.0 (g/dL)   HCT 25.3  66.4 - 40.3 (%)   MCV 89.2  78.0 - 100.0 (fL)   MCH 30.4  26.0 - 34.0 (pg)   MCHC 34.1  30.0 - 36.0 (g/dL)   RDW 47.4  25.9 - 56.3 (%)   Platelets 228  150 - 400 (K/uL)  BASIC METABOLIC PANEL     Status: Abnormal   Collection Time   06/05/11 12:45 PM      Component Value Range   Sodium 129 (*) 135 - 145 (mEq/L)   Potassium 3.8  3.5 - 5.1 (mEq/L)   Chloride 95 (*) 96 - 112 (mEq/L)   CO2 23  19 - 32 (mEq/L)   Glucose, Bld 114 (*) 70 - 99 (mg/dL)   BUN 14  6 - 23 (mg/dL)   Creatinine, Ser 8.75  0.50 - 1.10 (mg/dL)   Calcium 9.1  8.4 - 64.3 (mg/dL)   GFR calc non Af Amer 80 (*) >90 (mL/min)   GFR calc  Af Amer >90  >90 (mL/min)  URINALYSIS, ROUTINE W REFLEX MICROSCOPIC     Status: Normal   Collection Time   06/05/11  1:14 PM      Component Value Range   Color, Urine YELLOW  YELLOW    APPearance CLEAR  CLEAR    Specific Gravity, Urine 1.011  1.005 - 1.030    pH 7.5  5.0 - 8.0    Glucose, UA NEGATIVE  NEGATIVE (mg/dL)   Hgb urine dipstick NEGATIVE  NEGATIVE    Bilirubin Urine NEGATIVE  NEGATIVE    Ketones, ur NEGATIVE  NEGATIVE (mg/dL)   Protein, ur NEGATIVE  NEGATIVE (mg/dL)   Urobilinogen, UA 1.0  0.0 - 1.0 (mg/dL)   Nitrite NEGATIVE  NEGATIVE    Leukocytes, UA NEGATIVE  NEGATIVE   TYPE AND SCREEN     Status: Normal   Collection Time   06/05/11  3:00 PM      Component Value Range   ABO/RH(D) O POS     Antibody Screen NEG     Sample Expiration 06/08/2011    ABO/RH  Status: Normal   Collection Time   06/05/11  3:00 PM      Component Value Range   ABO/RH(D) O POS    COMPREHENSIVE METABOLIC PANEL     Status: Abnormal   Collection Time   06/06/11  6:02 AM      Component Value Range   Sodium 126 (*) 135 - 145 (mEq/L)   Potassium 3.5  3.5 - 5.1 (mEq/L)   Chloride 95 (*) 96 - 112 (mEq/L)   CO2 22  19 - 32 (mEq/L)   Glucose, Bld 111 (*) 70 - 99 (mg/dL)   BUN 11  6 - 23 (mg/dL)   Creatinine, Ser 4.09  0.50 - 1.10 (mg/dL)   Calcium 8.2 (*) 8.4 - 10.5 (mg/dL)   Total Protein 5.7 (*) 6.0 - 8.3 (g/dL)   Albumin 2.9 (*) 3.5 - 5.2 (g/dL)   AST 17  0 - 37 (U/L)   ALT 11  0 - 35 (U/L)   Alkaline Phosphatase 75  39 - 117 (U/L)   Total Bilirubin 0.6  0.3 - 1.2 (mg/dL)   GFR calc non Af Amer 85 (*) >90 (mL/min)   GFR calc Af Amer >90  >90 (mL/min)  CBC     Status: Abnormal   Collection Time   06/06/11  6:02 AM      Component Value Range   WBC 8.8  4.0 - 10.5 (K/uL)   RBC 3.81 (*) 3.87 - 5.11 (MIL/uL)   Hemoglobin 11.4 (*) 12.0 - 15.0 (g/dL)   HCT 81.1 (*) 91.4 - 46.0 (%)   MCV 87.9  78.0 - 100.0 (fL)   MCH 29.9  26.0 - 34.0 (pg)   MCHC 34.0  30.0 - 36.0 (g/dL)   RDW 78.2  95.6  - 21.3 (%)   Platelets 193  150 - 400 (K/uL)    Studies/Results: Dg Chest 1 View  06/05/2011  *RADIOLOGY REPORT*  Clinical Data: Status post fall on the right side of the chest today.  CHEST - 1 VIEW  Comparison: PA and lateral chest 01/30/2007.  Findings: There is some coarsening of the pulmonary interstitium, unchanged.  No focal airspace disease or effusion.  Heart size upper normal.  No acute bony abnormality.  Postoperative change left shoulder noted.  IMPRESSION: No acute finding.  Original Report Authenticated By: Bernadene Bell. Maricela Curet, M.D.   Dg Shoulder Right  06/05/2011  *RADIOLOGY REPORT*  Clinical Data: Fall on the right side.  RIGHT SHOULDER - 2+   IMPRESSION:  1.  Degenerate changes. 2. No evidence for acute  abnormality.  Original Report Authenticated By: Patterson Hammersmith, M.D.   Dg Hip Complete Right  06/05/2011  *RADIOLOGY REPORT*  Clinical Data: Fall.  Injury of the right hip.  RIGHT HIP -   IMPRESSION: Subcapital femoral neck fracture.  Consider possibility of pathologic fracture.  Original Report Authenticated By: Patterson Hammersmith, M.D.    Medications:    . cholecalciferol  1,000 Units Oral QHS  . ezetimibe  10 mg Oral QPM  . heparin subcutaneous  5,000 Units Subcutaneous Q8H  . lisinopril  20 mg Oral Daily  . metoprolol succinate  25 mg Oral Daily  . morphine      .  morphine injection  6 mg Intravenous Once  . multivitamin-lutein  1 capsule Oral BID  . rosuvastatin  5 mg Oral 2 times weekly  . sodium chloride  3 mL Intravenous Q12H  . DISCONTD: lisinopril  20 mg Oral Daily  . DISCONTD:  metoprolol succinate  25 mg Oral QPM  . DISCONTD: multivitamin-lutein  1 capsule Oral BID    morphine, DISCONTD: morphine     . sodium chloride      Assessment/Plan: Right femoral neck fracture  Scheduled for surgery this AM,patient is moderate risk for surgery that was explained to patient and family and they verbalize understanding and wishes to proceed with the  procedure.will give metoprolol prior to surgery . HX of CAD S/P metal stent: Patient is asymptomatic,EKG showed no acute changes ,case was discussed with cardiology last night who recommended to hold ASA and plavix before surgery and resume  ASA 81 mg only after surgery.continue BB,statin ,ACEI HTN : Fair,continue meds  DVT prophylaxis : On Malcolm heparin ,hold dose before procedure     LOS: 1 day   Brek Reece 06/06/2011, 8:36 AM

## 2011-06-07 ENCOUNTER — Inpatient Hospital Stay (HOSPITAL_COMMUNITY): Payer: Medicare Other

## 2011-06-07 ENCOUNTER — Encounter (HOSPITAL_COMMUNITY): Payer: Self-pay | Admitting: Orthopedic Surgery

## 2011-06-07 DIAGNOSIS — Z9289 Personal history of other medical treatment: Secondary | ICD-10-CM

## 2011-06-07 LAB — BASIC METABOLIC PANEL
CO2: 22 mEq/L (ref 19–32)
Calcium: 7.3 mg/dL — ABNORMAL LOW (ref 8.4–10.5)
Creatinine, Ser: 0.58 mg/dL (ref 0.50–1.10)

## 2011-06-07 LAB — CBC
MCH: 30 pg (ref 26.0–34.0)
MCV: 88.2 fL (ref 78.0–100.0)
Platelets: 159 10*3/uL (ref 150–400)
RBC: 2.63 MIL/uL — ABNORMAL LOW (ref 3.87–5.11)
RDW: 12.5 % (ref 11.5–15.5)

## 2011-06-07 LAB — PREPARE RBC (CROSSMATCH)

## 2011-06-07 LAB — URIC ACID: Uric Acid, Serum: 1.7 mg/dL — ABNORMAL LOW (ref 2.4–7.0)

## 2011-06-07 MED ORDER — ACETAMINOPHEN 10 MG/ML IV SOLN
1000.0000 mg | Freq: Once | INTRAVENOUS | Status: DC
  Filled 2011-06-07: qty 100

## 2011-06-07 MED ORDER — ASPIRIN 81 MG PO CHEW
81.0000 mg | CHEWABLE_TABLET | Freq: Every day | ORAL | Status: DC
  Administered 2011-06-07 – 2011-06-14 (×8): 81 mg via ORAL
  Filled 2011-06-07 (×8): qty 1

## 2011-06-07 MED ORDER — METOPROLOL SUCCINATE ER 25 MG PO TB24
25.0000 mg | ORAL_TABLET | ORAL | Status: DC
  Administered 2011-06-07 – 2011-06-13 (×7): 25 mg via ORAL
  Filled 2011-06-07 (×8): qty 1

## 2011-06-07 MED ORDER — ACETAMINOPHEN 10 MG/ML IV SOLN
1000.0000 mg | Freq: Once | INTRAVENOUS | Status: AC
  Administered 2011-06-07: 1000 mg via INTRAVENOUS
  Filled 2011-06-07: qty 100

## 2011-06-07 MED ORDER — FUROSEMIDE 10 MG/ML IJ SOLN
10.0000 mg | Freq: Once | INTRAMUSCULAR | Status: AC
  Administered 2011-06-07: 10 mg via INTRAVENOUS
  Filled 2011-06-07: qty 1

## 2011-06-07 MED FILL — Morphine Sulfate Inj 10 MG/ML: INTRAMUSCULAR | Qty: 1 | Status: AC

## 2011-06-07 MED FILL — Ondansetron HCl Inj 4 MG/2ML (2 MG/ML): INTRAMUSCULAR | Qty: 2 | Status: AC

## 2011-06-07 NOTE — Progress Notes (Signed)
Physical Therapy Evaluation Patient Details Name: [name] MRN: [id number] DOB: 1928/09/13 Today's Date: 06/07/2011 [id number] Ev2 TA  Problem List:  Patient Active Problem List  Diagnoses  . Postop Acute blood loss anemia  . Postop Hyponatremia  . Postop Transfusion history    Past Medical History:  Past Medical History  Diagnosis Date  . Hypertension   . Myocardial infarction    Past Surgical History:  Past Surgical History  Procedure Date  . Coronary angioplasty with stent placement   . Shoulder surgery   . Breast lumpectomy   . Wrist surgery     PT Assessment/Plan/Recommendation PT Assessment Clinical Impression Statement: Patient s/p fall with right hip fracture and shoulder soft tissue injury presents with limited activity tolerance, decreased strength, ROM and decreased mobility and will benefit from skilled PT in acute as well as post acute setting prior to d/c home.  Pt interested in [facility] for rehab. PT Recommendation/Assessment: Patient will need skilled PT in the acute care venue PT Problem List: Decreased range of motion;Decreased strength;Decreased knowledge of use of DME;Decreased activity tolerance;Decreased balance;Decreased mobility;Pain PT Therapy Diagnosis : Difficulty walking;Acute pain PT Plan PT Frequency: Min 3X/week PT Treatment/Interventions: DME instruction;Functional mobility training;Therapeutic activities;Therapeutic exercise;Patient/family education PT Recommendation Follow Up Recommendations: Skilled nursing facility Equipment Recommended: Defer to next venue PT Goals  Acute Rehab PT Goals PT Goal Formulation: With patient Time For Goal Achievement: 7 days Pt will go Supine/Side to Sit: with mod assist PT Goal: Supine/Side to Sit - Progress: Not met Pt will go Sit to Supine/Side: with mod assist PT Goal: Sit to Supine/Side - Progress: Not met Pt will Transfer Bed to Chair/Chair to Bed: with mod assist PT Transfer Goal:  Bed to Chair/Chair to Bed - Progress: Not met Pt will Stand: with mod assist;with unilateral upper extremity support;1 - 2 min (in prep for ambulation) PT Goal: Stand - Progress: Not met Pt will Perform Home Exercise Program: with supervision, verbal cues required/provided PT Goal: Perform Home Exercise Program - Progress: Not met  PT Evaluation Precautions/Restrictions  Precautions Precautions: Fall;Posterior Hip Precaution Booklet Issued: No Precaution Comments: educated patient verbal and demo regarding hip precautions Restrictions Weight Bearing Restrictions: Yes RLE Weight Bearing: Weight bearing as tolerated Prior Functioning  Home Living Lives With: Spouse Type of Home: House Prior Function Level of Independence: Independent with basic ADLs;Independent with transfers;Independent with gait;Independent with homemaking with ambulation Driving: Yes Cognition Cognition Arousal/Alertness: Awake/alert Overall Cognitive Status: Appears within functional limits for tasks assessed Sensation/Coordination   Extremity Assessment RUE Assessment RUE Assessment: Not tested (but painful with any shoulder elevation) LUE Assessment LUE Assessment: Not tested RLE Assessment RLE Assessment: Exceptions to [facility] RLE AROM (degrees) Overall AROM Right Lower Extremity: Deficits;Due to pain RLE Overall AROM Comments: ankle WFL, hip limited with pain RLE Strength RLE Overall Strength: Deficits RLE Overall Strength Comments: ankle WFL positive quad set LLE Assessment LLE Assessment: Within Functional Limits Mobility (including Balance) Bed Mobility Bed Mobility: Yes Supine to Sit: 1: +2 Total assist Supine to Sit Details (indicate cue type and reason): pt=20% left foot on bed to scoot hips over with assist for right LE, and 2 assist to sit up at edge of bed Sitting - Scoot to Edge of Bed: 3: Mod assist Sitting - Scoot to Edge of Bed Details (indicate cue type and reason): scooting right hip  out with pad under patient Transfers Transfers: Yes Squat Pivot Transfers: 1: +2 Total assist Squat Pivot Transfer Details (indicate cue type and  reason): pt=25% bed to recliner without use of device due to right UE pain after fall  Balance Balance Assessed: Yes Static Sitting Balance Static Sitting - Balance Support: Right upper extremity supported;No upper extremity supported Static Sitting - Level of Assistance: 5: Stand by assistance Static Sitting - Comment/# of Minutes: sat edge of bed approx 5 minutes due to lightheaded with decreased hemoglobin Exercise  Total Joint Exercises Ankle Circles/Pumps: AROM;Both;10 reps;Supine Quad Sets: AROM;Both;10 reps;Supine Heel Slides: AAROM;Both;10 reps;Supine End of Session PT - End of Session Equipment Utilized During Treatment: Gait belt Activity Tolerance: Patient limited by pain Patient left: in chair Nurse Communication: Mobility status for transfers General Behavior During Session: [facility] for tasks performed Cognition: [facility] for tasks performed  [facility] 06/07/2011, 1:46 PM

## 2011-06-07 NOTE — Progress Notes (Signed)
Subjective: Patient seen and examined ,feeling a little rough after worked with PT  Objective: Vital signs in last 24 hours: Temp:  [97.6 F (36.4 C)-100.4 F (38 C)] 99.7 F (37.6 C) (01/14 0500) Pulse Rate:  [61-85] 85  (01/14 1112) Resp:  [15-18] 18  (01/14 0800) BP: (123-156)/(38-72) 123/66 mmHg (01/14 1112) SpO2:  [96 %-100 %] 96 % (01/14 1112) FiO2 (%):  [2 %] 2 % (01/13 1600) Weight change:  Last BM Date: 06/05/11  Intake/Output from previous day: 01/13 0701 - 01/14 0700 In: 2521.9 [I.V.:2021.9; IV Piggyback:500] Out: 1183 [Urine:683; Blood:500] Total I/O In: 3 [I.V.:3] Out: -    Physical Exam: General: Alert, awake, oriented x3, in mild  acute distress. Heart: Regular rate and rhythm, without murmurs, rubs, gallops. Lungs: Clear to auscultation bilaterally. Abdomen: Soft, nontender, nondistended, positive bowel sounds. Extremities: No clubbing cyanosis or edema with positive pedal pulses. Neuro: Grossly intact, nonfocal.    Lab Results: Results for orders placed during the hospital encounter of 06/05/11 (from the past 24 hour(s))  GLUCOSE, CAPILLARY     Status: Abnormal   Collection Time   06/06/11  1:04 PM      Component Value Range   Glucose-Capillary 118 (*) 70 - 99 (mg/dL)  GLUCOSE, CAPILLARY     Status: Abnormal   Collection Time   06/06/11 10:33 PM      Component Value Range   Glucose-Capillary 110 (*) 70 - 99 (mg/dL)  CBC     Status: Abnormal   Collection Time   06/07/11  5:53 AM      Component Value Range   WBC 7.8  4.0 - 10.5 (K/uL)   RBC 2.63 (*) 3.87 - 5.11 (MIL/uL)   Hemoglobin 7.9 (*) 12.0 - 15.0 (g/dL)   HCT 16.1 (*) 09.6 - 46.0 (%)   MCV 88.2  78.0 - 100.0 (fL)   MCH 30.0  26.0 - 34.0 (pg)   MCHC 34.1  30.0 - 36.0 (g/dL)   RDW 04.5  40.9 - 81.1 (%)   Platelets 159  150 - 400 (K/uL)  BASIC METABOLIC PANEL     Status: Abnormal   Collection Time   06/07/11  5:53 AM      Component Value Range   Sodium 128 (*) 135 - 145 (mEq/L)   Potassium 3.5  3.5 - 5.1 (mEq/L)   Chloride 99  96 - 112 (mEq/L)   CO2 22  19 - 32 (mEq/L)   Glucose, Bld 111 (*) 70 - 99 (mg/dL)   BUN 9  6 - 23 (mg/dL)   Creatinine, Ser 9.14  0.50 - 1.10 (mg/dL)   Calcium 7.3 (*) 8.4 - 10.5 (mg/dL)   GFR calc non Af Amer 85 (*) >90 (mL/min)   GFR calc Af Amer >90  >90 (mL/min)  GLUCOSE, CAPILLARY     Status: Abnormal   Collection Time   06/07/11  7:33 AM      Component Value Range   Glucose-Capillary 105 (*) 70 - 99 (mg/dL)   Comment 1 Notify RN      Studies/Results: Dg Chest 1 View  06/05/2011  *RADIOLOGY REPORT*  Clinical Data: Status post fall on the right side of the chest today.  CHEST - 1 VIEW  Comparison: PA and lateral chest 01/30/2007.  Findings: There is some coarsening of the pulmonary interstitium, unchanged.  No focal airspace disease or effusion.  Heart size upper normal.  No acute bony abnormality.  Postoperative change left shoulder noted.  IMPRESSION: No acute  finding.  Original Report Authenticated By: Bernadene Bell. Maricela Curet, M.D.   Dg Shoulder Right  06/05/2011  *RADIOLOGY REPORT*  Clinical Data: Fall on the right side.  RIGHT SHOULDER - 2+ VIEW  Comparison: Chest x-ray 01/30/2007  Findings: Degenerative changes are seen in the shoulder.  There is no evidence for acute fracture or subluxation.  The right lung apex is unremarkable in appearance.  Bones appear demineralized.  IMPRESSION:  1.  Degenerate changes. 2. No evidence for acute  abnormality.  Original Report Authenticated By: Patterson Hammersmith, M.D.   Dg Hip Complete Right  06/05/2011  *RADIOLOGY REPORT*  Clinical Data: Fall.  Injury of the right hip.  RIGHT HIP - COMPLETE 2+ VIEW  Comparison: None.  Findings: Views of the right hip include an AP view of the pelvis. There is an acute subcapital femoral neck fracture, associated with varus angulation and impaction at the fracture site.  There is significant lucency at the fracture site, raising the question of pathologic fracture.   Is there history of malignancy?  Bones generally appear radiolucent.  There is degenerative changes lower lumbar spine.  IMPRESSION: Subcapital femoral neck fracture.  Consider possibility of pathologic fracture.  Original Report Authenticated By: Patterson Hammersmith, M.D.    Medications:    . ceFAZolin (ANCEF) IV  1 g Intravenous Q6H  . cholecalciferol  1,000 Units Oral QHS  . docusate sodium  100 mg Oral BID  . enoxaparin  40 mg Subcutaneous Q24H  . ezetimibe  10 mg Oral QPM  . lisinopril  20 mg Oral Daily  . metoprolol succinate  25 mg Oral Daily  . multivitamin-lutein  1 capsule Oral BID  . rosuvastatin  5 mg Oral 2 times weekly  . sodium chloride  3 mL Intravenous Q12H  . DISCONTD: bupivacaine liposome  20 mL Infiltration Once  . DISCONTD: heparin subcutaneous  5,000 Units Subcutaneous Q8H    sodium chloride, acetaminophen, acetaminophen, bisacodyl, menthol-cetylpyridinium, methocarbamol(ROBAXIN) IV, methocarbamol, metoCLOPramide (REGLAN) injection, metoCLOPramide, morphine, ondansetron (ZOFRAN) IV, ondansetron, phenol, polyethylene glycol, sodium chloride, sodium phosphate, traMADol, DISCONTD: bupivacaine liposome, DISCONTD: HYDROmorphone, DISCONTD: meperidine, DISCONTD: morphine, DISCONTD: promethazine     . sodium chloride    . sodium chloride 75 mL/hr at 06/06/11 1709  . DISCONTD: lactated ringers      Assessment/Plan: Right femoral neck fracture  POD#1,doing well ,continue PT  Anemia: Post op drop in H/H noted , repeat H&H later today ,continue to monitor and transfuse as  needed  HX of CAD S/P metal stent:   discussed with cardiology who recommended to hold ASA and plavix before surgery and resume ASA 81 mg only after surgery.continue BB,statin ,ACEI  Hyponatremia: chronic ,? Etiology ,check urine sodium ,urine osmolality ,serum osmolaity ,uric acid,TSH and cortisol level,currently stable while on NS. HTN : controlled ,continue meds  DVT prophylaxis :  On Lovenox and  SCD    LOS: 2 days   Shakthi Scipio 06/07/2011, 11:56 AM

## 2011-06-07 NOTE — Progress Notes (Signed)
Subjective: 1 Day Post-Op Procedure(s) (LRB): ARTHROPLASTY BIPOLAR HIP (Right) Patient reports pain as mild.   Patient seen in rounds with Dr. Lequita Halt. Patient has complaints of sore hip.  Hgb is down to 7.9.  Will give blood. We will start therapy today. Plan is to go Rehab most likely after hospital stay.  Objective: Vital signs in last 24 hours: Temp:  [97.6 F (36.4 C)-100.4 F (38 C)] 99.7 F (37.6 C) (01/14 0500) Pulse Rate:  [61-85] 85  (01/14 1112) Resp:  [15-18] 18  (01/14 0800) BP: (123-156)/(38-72) 123/66 mmHg (01/14 1112) SpO2:  [96 %-100 %] 96 % (01/14 1112) FiO2 (%):  [2 %] 2 % (01/13 1600)  Intake/Output from previous day:  Intake/Output Summary (Last 24 hours) at 06/07/11 1157 Last data filed at 06/07/11 1121  Gross per 24 hour  Intake 1524.91 ml  Output    850 ml  Net 674.91 ml    Intake/Output this shift: Total I/O In: 3 [I.V.:3] Out: -   Labs:  Basename 06/07/11 0553 06/06/11 0602 06/05/11 1245  HGB 7.9* 11.4* 13.5    Basename 06/07/11 0553 06/06/11 0602  WBC 7.8 8.8  RBC 2.63* 3.81*  HCT 23.2* 33.5*  PLT 159 193    Basename 06/07/11 0553 06/06/11 0602  NA 128* 126*  K 3.5 3.5  CL 99 95*  CO2 22 22  BUN 9 11  CREATININE 0.58 0.57  GLUCOSE 111* 111*  CALCIUM 7.3* 8.2*   No results found for this basename: LABPT:2,INR:2 in the last 72 hours  Exam - Neurovascular intact Sensation intact distally Dressing - clean, dry Motor function intact - moving foot and toes well on exam.  Hemovac pulled without difficulty.  Past Medical History  Diagnosis Date  . Hypertension   . Myocardial infarction     Assessment/Plan: 1 Day Post-Op Procedure(s) (LRB): ARTHROPLASTY BIPOLAR HIP (Right)  Advance diet Up with therapy Discharge to SNF  DVT Prophylaxis - Xarelto / Coumadin Protocol WBAT D/C Knee Immobilizer Hemovac Pulled Begin Therapy Keep foley until tomorrow.   PERKINS, ALEXZANDREW 06/07/2011, 11:57 AM

## 2011-06-07 NOTE — Progress Notes (Signed)
   CARE MANAGEMENT NOTE 06/07/2011  Patient:  [name], [name]   Account Number:  [id number]  Date Initiated:  06/07/2011  Documentation initiated by:  Lanier Clam  Subjective/Objective Assessment:   ADMITTED W/FALL.R HIP FX.HX: MI,STENT 2008.     Action/Plan:   FROM HOME W/FAMILY.   Anticipated DC Date:  06/09/2011   Anticipated DC Plan:  SKILLED NURSING FACILITY  In-house referral  Clinical Social Worker         Choice offered to / List presented to:  C-1 Patient           Status of service:  In process, will continue to follow Medicare Important Message given?   (If response is "NO", the following Medicare IM given date fields will be blank) Date Medicare IM given:   Date Additional Medicare IM given:    Discharge Disposition:    Per UR Regulation:  Reviewed for med. necessity/level of care/duration of stay  Comments:  06/07/11 Kalila Adkison RN,BSN NCM [phone] NOTED PT-SNF. SW NOTIFIED. PT/OT EVAL PEND.POD#2.

## 2011-06-07 NOTE — Op Note (Signed)
NAME:  [name] NO.:  [id number]  MEDICAL RECORD NO.:  [id number]  LOCATION:  1521                         FACILITY:  [facility]  PHYSICIAN:  Ollen Gross, M.D.    DATE OF BIRTH:  December 28, 1928  DATE OF PROCEDURE:  06/06/2011 DATE OF DISCHARGE:                              OPERATIVE REPORT   PREOPERATIVE DIAGNOSIS:  Right hip displaced femoral neck fracture.  POSTOPERATIVE DIAGNOSIS:  Right hip displaced femoral neck fracture.  PROCEDURE:  Right hip cemented bipolar hemiarthroplasty.  SURGEON:  Ollen Gross, M.D.  ASSISTANT:  French Ana A. Shuford, PA-C.  ANESTHESIA:  General.  ESTIMATED BLOOD LOSS:  200.  DRAINS:  Hemovac x1.  COMPLICATIONS:  None.  CONDITION:  Stable to recovery.  BRIEF CLINICAL NOTE:  [name] is an 78 year old female who had a fall at home yesterday, tripping over a dog, landing on her right side, sustaining a displaced right femoral neck fracture.  She has been cleared medically, presents now for right hip hemiarthroplasty.  PROCEDURE IN DETAIL:  After successful administration of general anesthetic, the patient was placed in her left lateral decubitus position with her right side up and held with the hip positioner.  Right lower extremity was isolated from her perineum with plastic drapes, and prepped and draped in the usual sterile fashion.  A short posterolateral incision was made with a #10 blade through subcutaneous tissue to the fascia lata, which was incised in line with the skin incision.  The sciatic nerve was palpated and protected and the short rotators and capsule were isolated off the femur.  The femoral head fractures identified.  The head was removed, diameter measured to be 45 mm. Retractors were placed around the proximal femur and the femoral neck cut was placed at the appropriate length and configuration.  A cut was made with an oscillating saw.  The starter reamer was passed into the canal, and then the  lateralizing reamer placed.  Broaching was then performed with the [employer] Basic cemented stem.  We got up to a size #5, which had good torsional fit in the femoral canal.  Off the size #5, we trialed with the standard neck and the 28, +5 head with a 45 bipolar.  There was excellent suction fit with the bipolar.  There was great stability in full extension, full external rotation, 70 degrees flexion, 40 degrees adduction, 90 degrees internal rotation, 90 degrees of flexion, and 70 degrees of internal rotation.  I placed the right leg on top of the left, it was felt as though the leg lengths were equal. The hips were then dislocated, and trials removed.  We sized the cement restrictor and size 4 was most appropriate.  The size 4 cement restrictor was placed at the appropriate depth in the femoral canal. The canal was then prepared with pulsatile lavage while the cement was mixed.  The canal was dried out, and once ready for implantation, the cement was injected into the femoral canal and pressurized.  The [employer] Basic cemented size 5 stem was cemented in place at about 20 degrees anteversion.  Once the cement was fully hardened, then we placed the 28, +5 head and the  45 mm bipolar.  Hips reduced at the same stability parameters.  The wound was copiously irrigated with saline solution and short rotators and capsule were reattached to the femur through drill holes with Ethibond suture.  Fascia lata was closed over Hemovac drain with interrupted #1 Vicryl, subcu closed with #1 and 2-0 Vicryl and subcuticular running 4-0 Monocryl.  Incisions cleaned and dried and Steri-Strips and a bulky sterile dressing applied.  Drains hooked to suction.  She was placed into a knee immobilizer, awakened, and transported to Recovery in stable condition.     Ollen Gross, M.D.     FA/MEDQ  D:  06/06/2011  T:  06/07/2011  Job:  [id number]

## 2011-06-07 NOTE — Progress Notes (Signed)
   CARE MANAGEMENT NOTE 06/07/2011  Patient:  [name], [name]   Account Number:  [id number]  Date Initiated:  06/07/2011  Documentation initiated by:  Lanier Clam  Subjective/Objective Assessment:   ADMITTED W/FALL.R HIP FX.HX: MI,STENT 2008.     Action/Plan:   FROM HOME W/FAMILY.   Anticipated DC Date:  06/09/2011   Anticipated DC Plan:  HOME W HOME HEALTH SERVICES         Choice offered to / List presented to:  C-1 Patient           Status of service:  In process, will continue to follow Medicare Important Message given?   (If response is "NO", the following Medicare IM given date fields will be blank) Date Medicare IM given:   Date Additional Medicare IM given:    Discharge Disposition:    Per UR Regulation:  Reviewed for med. necessity/level of care/duration of stay  Comments:  06/07/11 Tiffiany Beadles RN,BSN NCM [phone] PT/OT EVAL PEND.POD#2.

## 2011-06-07 NOTE — Progress Notes (Signed)
Physical Therapy Treatment Patient Details Name: [name] MRN: [id number] DOB: 10-Jun-1928 Today's Date: 06/07/2011 1450-1505 1TA  PT Assessment/Plan  PT - Assessment/Plan Comments on Treatment Session: Patient fatigued after sitting this pm.  Will recieve 2 units of blood this pm.  PT Plan: Discharge plan remains appropriate PT Frequency: Min 3X/week Follow Up Recommendations: Skilled nursing facility Equipment Recommended: Defer to next venue PT Goals  Acute Rehab PT Goals PT Goal Formulation: With patient Time For Goal Achievement: 7 days Pt will go Supine/Side to Sit: with mod assist PT Goal: Supine/Side to Sit - Progress: Not met Pt will go Sit to Supine/Side: with mod assist PT Goal: Sit to Supine/Side - Progress: Progressing toward goal Pt will Transfer Bed to Chair/Chair to Bed: with mod assist PT Transfer Goal: Bed to Chair/Chair to Bed - Progress: Progressing toward goal Pt will Stand: with mod assist;with unilateral upper extremity support;1 - 2 min (in prep for ambulation) PT Goal: Stand - Progress: Not met Pt will Perform Home Exercise Program: with supervision, verbal cues required/provided PT Goal: Perform Home Exercise Program - Progress: Not met  PT Treatment Precautions/Restrictions  Precautions Precautions: Posterior Hip Precaution Booklet Issued: No Precaution Comments: educated patient verbal and demo regarding hip precautions Restrictions Weight Bearing Restrictions: Yes RLE Weight Bearing: Weight bearing as tolerated Mobility (including Balance) Bed Mobility Bed Mobility: Yes Sit to Supine: 1: +2 Total assist Sit to Supine - Details (indicate cue type and reason): pt=10% Transfers Transfers: Yes Squat Pivot Transfers: 1: +2 Total assist;With upper extremity assistance;With armrests Squat Pivot Transfer Details (indicate cue type and reason): left UE assist and pt=20% with c/o fatigue after sitting prolonged time and PRBC not yet started    Exercise   End of Session PT - End of Session Equipment Utilized During Treatment: Gait belt Activity Tolerance: Patient limited by pain Patient left: in bed;with call bell in reach Nurse Communication: Mobility status for transfers General Behavior During Session: [facility] for tasks performed Cognition: [facility] for tasks performed  [facility] 06/07/2011, 3:32 PM

## 2011-06-08 ENCOUNTER — Encounter (HOSPITAL_COMMUNITY): Payer: Self-pay

## 2011-06-08 LAB — CBC
MCH: 30.2 pg (ref 26.0–34.0)
MCHC: 34.8 g/dL (ref 30.0–36.0)
MCHC: 35.2 g/dL (ref 30.0–36.0)
MCV: 85.7 fL (ref 78.0–100.0)
Platelets: 133 10*3/uL — ABNORMAL LOW (ref 150–400)
Platelets: 134 10*3/uL — ABNORMAL LOW (ref 150–400)
RDW: 13.1 % (ref 11.5–15.5)
RDW: 13.5 % (ref 11.5–15.5)
WBC: 9 10*3/uL (ref 4.0–10.5)

## 2011-06-08 LAB — BASIC METABOLIC PANEL
CO2: 21 mEq/L (ref 19–32)
Calcium: 7.7 mg/dL — ABNORMAL LOW (ref 8.4–10.5)
Creatinine, Ser: 0.55 mg/dL (ref 0.50–1.10)
GFR calc Af Amer: >90 mL/min (ref >90)
GFR calc non Af Amer: 86 mL/min — ABNORMAL LOW (ref >90)

## 2011-06-08 LAB — CREATININE, URINE, RANDOM: Creatinine, Urine: 138.8 mg/dL

## 2011-06-08 LAB — SODIUM, URINE, RANDOM: Sodium, Ur: 10 mEq/L

## 2011-06-08 LAB — TSH: TSH: 2.412 u[IU]/mL (ref 0.350–4.500)

## 2011-06-08 NOTE — Progress Notes (Signed)
Pt had six beats of V-Tach at 22:03 RN wasn't made aware until 22:50 on call NP made aware at this time pts vital signs are stable and patient is asymtomatic. Will continue to monitor pt.

## 2011-06-08 NOTE — Progress Notes (Signed)
Subjective: Patient seen and examined,feeling better today ,no pain  Objective: Vital signs in last 24 hours: Temp:  [99.1 F (37.3 C)-100 F (37.8 C)] 100 F (37.8 C) (01/15 1451) Pulse Rate:  [75-83] 83  (01/15 1451) Resp:  [16-20] 18  (01/15 1600) BP: (124-136)/(67-78) 136/78 mmHg (01/15 1451) SpO2:  [97 %-98 %] 98 % (01/15 1451) Weight change:  Last BM Date: 06/05/11  Intake/Output from previous day: 01/14 0701 - 01/15 0700 In: 2063 [I.V.:2038; Blood:25] Out: 450 [Urine:450]     Physical Exam: General: Alert, awake, oriented x3, in no acute distress. HEENT: No bruits, no goiter. Heart: Regular rate and rhythm, without murmurs, rubs, gallops. Lungs: Clear to auscultation bilaterally. Abdomen: Soft, nontender, nondistended, positive bowel sounds. Extremities: No clubbing cyanosis or edema with positive pedal pulses.Right arm in sling  Neuro: Grossly intact, nonfocal.    Lab Results: Results for orders placed during the hospital encounter of 06/05/11 (from the past 24 hour(s))  CBC     Status: Abnormal   Collection Time   06/08/11 12:05 AM      Component Value Range   WBC 9.0  4.0 - 10.5 (K/uL)   RBC 3.16 (*) 3.87 - 5.11 (MIL/uL)   Hemoglobin 9.5 (*) 12.0 - 15.0 (g/dL)   HCT 16.1 (*) 09.6 - 46.0 (%)   MCV 86.4  78.0 - 100.0 (fL)   MCH 30.1  26.0 - 34.0 (pg)   MCHC 34.8  30.0 - 36.0 (g/dL)   RDW 04.5  40.9 - 81.1 (%)   Platelets 134 (*) 150 - 400 (K/uL)  CBC     Status: Abnormal   Collection Time   06/08/11  5:34 AM      Component Value Range   WBC 11.1 (*) 4.0 - 10.5 (K/uL)   RBC 3.15 (*) 3.87 - 5.11 (MIL/uL)   Hemoglobin 9.5 (*) 12.0 - 15.0 (g/dL)   HCT 91.4 (*) 78.2 - 46.0 (%)   MCV 85.7  78.0 - 100.0 (fL)   MCH 30.2  26.0 - 34.0 (pg)   MCHC 35.2  30.0 - 36.0 (g/dL)   RDW 95.6  21.3 - 08.6 (%)   Platelets 133 (*) 150 - 400 (K/uL)  BASIC METABOLIC PANEL     Status: Abnormal   Collection Time   06/08/11  5:34 AM      Component Value Range   Sodium 127  (*) 135 - 145 (mEq/L)   Potassium 3.5  3.5 - 5.1 (mEq/L)   Chloride 97  96 - 112 (mEq/L)   CO2 21  19 - 32 (mEq/L)   Glucose, Bld 114 (*) 70 - 99 (mg/dL)   BUN 10  6 - 23 (mg/dL)   Creatinine, Ser 5.78  0.50 - 1.10 (mg/dL)   Calcium 7.7 (*) 8.4 - 10.5 (mg/dL)   GFR calc non Af Amer 86 (*) >90 (mL/min)   GFR calc Af Amer >90  >90 (mL/min)  TSH     Status: Normal   Collection Time   06/08/11  5:34 AM      Component Value Range   TSH 2.412  0.350 - 4.500 (uIU/mL)  CORTISOL     Status: Normal   Collection Time   06/08/11  5:34 AM      Component Value Range   Cortisol, Plasma 15.3      Studies/Results: Dg Forearm Right  06/07/2011  *RADIOLOGY REPORT*  Clinical Data: Pain, injured following  RIGHT FOREARM - 2 VIEW  Comparison: None  Findings: Diffuse  osseous demineralization. Volar plate and multiple screws identified at distal radius post ORIF. Small elbow joint effusion. No acute fracture, dislocation or bone destruction is seen.  IMPRESSION: Osseous demineralization. Post ORIF of distal right radial fracture. No definite acute bony abnormalities identified, though a small elbow joint effusion is present.  Original Report Authenticated By: Lollie Marrow, M.D.   Dg Humerus Right  06/07/2011  *RADIOLOGY REPORT*  Clinical Data: Severe arm pain, injured trying to catch herself falling 2 days ago  RIGHT HUMERUS - 2+ VIEW  Comparison: Right shoulder radiographs 06/05/2011  Findings: Osseous mineralization diffusely diminished. AC joint alignment normal. Small displaced fragment identified at surgical neck of right humerus consistent with fracture. No dislocation or additional fracture identified.  IMPRESSION: Nondisplaced fracture at surgical neck of right humerus.  Original Report Authenticated By: Lollie Marrow, M.D.    Medications:    . aspirin  81 mg Oral Daily  . cholecalciferol  1,000 Units Oral QHS  . docusate sodium  100 mg Oral BID  . enoxaparin  40 mg Subcutaneous Q24H  . ezetimibe   10 mg Oral QPM  . lisinopril  20 mg Oral Daily  . metoprolol succinate  25 mg Oral Q24H  . multivitamin-lutein  1 capsule Oral BID  . rosuvastatin  5 mg Oral 2 times weekly  . sodium chloride  3 mL Intravenous Q12H    sodium chloride, acetaminophen, acetaminophen, bisacodyl, menthol-cetylpyridinium, methocarbamol(ROBAXIN) IV, methocarbamol, metoCLOPramide (REGLAN) injection, metoCLOPramide, morphine, ondansetron (ZOFRAN) IV, ondansetron, phenol, polyethylene glycol, sodium chloride, traMADol     . sodium chloride 75 mL/hr at 06/08/11 1334  . sodium chloride 75 mL/hr at 06/06/11 1709    Assessment/Plan:  Right femoral neck fracture/Right humeral head fracture   S/P right hip hemiarthroplasty POD#2.Right humeral  fracture being treated conservatively.continue pain control and  PT  Anemia:  Post op drop in H/H noted ,S/P transfusion of 2 units PRBCs 1/14  HX of CAD S/P metal stent:  plavix  on hold as per cardiology recommendations ,continue  ASA 81 mg.continue BB,statin ,ACEI .discuss  ? Need to resume f plavix  ,please discuss with cardiology before discharge .(see H&p) Hyponatremia: chronic ,? Etiology , urine sodium ,urine of 10 C/W dehydration  However  serum uric acid noted to be low which is against dehydration and more C/W SIADH .TSH and cortisol level WNL.I will recheck urine sodium and osmolality .Patient is currently receiving NS . HTN : controlled ,continue meds  DVT prophylaxis :  On Lovenox and SCD       LOS: 3 days   Carlyn Mullenbach 06/08/2011, 8:01 PM

## 2011-06-08 NOTE — Progress Notes (Signed)
Occupational Therapy Note Spoke with patient who states she is currently uncomfortable from moving around alittle in bed. Informed nursing of patient's pain and she will check when pain meds are due. Will try back later as able and once patient medicated.  Judithann Sauger OTR/L [phone] 06/08/2011

## 2011-06-08 NOTE — Progress Notes (Signed)
Subjective: 2 Days Post-Op Procedure(s) (LRB): ARTHROPLASTY BIPOLAR HIP (Right) Patient reports pain as mild.   Patient seen in rounds with Dr. Lequita Halt. Patient has complaints of right shoulder pain still.  She was found to have nondisplaced humeral neck fracture.  Sling for immobilization.  Objective: Vital signs in last 24 hours: Temp:  [99.1 F (37.3 C)-100.6 F (38.1 C)] 99.1 F (37.3 C) (01/15 0523) Pulse Rate:  [71-85] 75  (01/15 0523) Resp:  [16-20] 18  (01/15 0800) BP: (116-146)/(62-78) 134/72 mmHg (01/15 0523) SpO2:  [96 %-99 %] 98 % (01/15 0523)  Intake/Output from previous day:  Intake/Output Summary (Last 24 hours) at 06/08/11 1024 Last data filed at 06/08/11 0923  Gross per 24 hour  Intake   2066 ml  Output   2200 ml  Net   -134 ml    Intake/Output this shift: Total I/O In: 3 [I.V.:3] Out: 1750 [Urine:1750]  Labs:  [facility] 06/08/11 0534 06/08/11 0005 06/07/11 0553 06/06/11 0602 06/05/11 1245  HGB 9.5* 9.5* 7.9* 11.4* 13.5    Basename 06/08/11 0534 06/08/11 0005  WBC 11.1* 9.0  RBC 3.15* 3.16*  HCT 27.0* 27.3*  PLT 133* 134*    Basename 06/08/11 0534 06/07/11 0553  NA 127* 128*  K 3.5 3.5  CL 97 99  CO2 21 22  BUN 10 9  CREATININE 0.55 0.58  GLUCOSE 114* 111*  CALCIUM 7.7* 7.3*   No results found for this basename: LABPT:2,INR:2 in the last 72 hours  Exam - Neurovascular intact Sensation intact distally Dressing/Incision - clean, dry, no drainage Motor function intact - moving foot and toes well on exam.   Past Medical History  Diagnosis Date  . Hypertension   . Myocardial infarction     Assessment/Plan: 2 Days Post-Op Procedure(s) (LRB): ARTHROPLASTY BIPOLAR HIP (Right) Right Humerus Fracture - sling  Up with therapy  DVT Prophylaxis - Lovenox WBAT  PERKINS, ALEXZANDREW 06/08/2011, 10:24 AM

## 2011-06-08 NOTE — Progress Notes (Signed)
Occupational Therapy Evaluation Patient Details Name: [name] MRN: [id number] DOB: 01-18-1929 Today's Date: 1/15/20131440 1525 ev2  Problem List:  Patient Active Problem List  Diagnoses  . Postop Acute blood loss anemia  . Postop Hyponatremia  . Postop Transfusion history    Past Medical History:  Past Medical History  Diagnosis Date  . Hypertension   . Myocardial infarction    Past Surgical History:  Past Surgical History  Procedure Date  . Coronary angioplasty with stent placement   . Shoulder surgery   . Breast lumpectomy   . Wrist surgery   . Hip arthroplasty 06/06/2011    Procedure: ARTHROPLASTY BIPOLAR HIP;  Surgeon: [name];  Location: [facility];  Service: Orthopedics;  Laterality: Right;    OT Assessment/Plan/Recommendation OT Assessment Clinical Impression Statement: this 78 year old female was admitted with R hip fx (s/p posterior THA) and nondisplaced R humerus fx.  She needs A x 2 for ADLs and mobility.  She is appropriate for skilled OT in the acute setting with A x 2 goals, pt 25-40% goals.  OT Recommendation/Assessment: Patient will need skilled OT in the acute care venue OT Problem List: Decreased strength;Decreased activity tolerance;Impaired balance (sitting and/or standing);Decreased knowledge of use of DME or AE;Decreased knowledge of precautions;Pain Barriers to Discharge: Decreased caregiver support OT Therapy Diagnosis : Generalized weakness OT Plan OT Frequency: Min 1X/week OT Treatment/Interventions: Self-care/ADL training;Therapeutic activities;Patient/family education;Balance training OT Recommendation Follow Up Recommendations: Skilled nursing facility Equipment Recommended: Defer to next venue Individuals Consulted Consulted and Agree with Results and Recommendations: Patient OT Goals Acute Rehab OT Goals OT Goal Formulation: With patient Time For Goal Achievement: 2 weeks  OT Evaluation Precautions/Restrictions    Precautions Precautions: Posterior Hip Precaution Booklet Issued: No Precaution Comments: educated patient verbal and demo regarding hip precautions Required Braces or Orthoses: Yes Other Brace/Splint: rue sling Restrictions Weight Bearing Restrictions: Yes RLE Weight Bearing: Weight bearing as tolerated Other Position/Activity Restrictions: RLE; RUE--non weightbearing:  non displaced fx Prior Functioning  Independent with ADLs   ADL ADL Eating/Feeding: Simulated;Set up;Other (comment) (lue) Where Assessed - Eating/Feeding: Bed level Grooming: Simulated;Minimal assistance Where Assessed - Grooming: Supine, head of bed up Upper Body Bathing: Simulated;Moderate assistance Where Assessed - Upper Body Bathing: Supine, head of bed up Lower Body Bathing: Simulated;+2 Total assistance;Comment for patient % (0) Where Assessed - Lower Body Bathing: Rolling right and/or left;Other (comment) (thps) Upper Body Dressing: Simulated;Maximal assistance Where Assessed - Upper Body Dressing: Supine, head of bed up Lower Body Dressing: Simulated;+2 Total assistance;Comment for patient % (0) Where Assessed - Lower Body Dressing: Rolling right and/or left (thps) Toilet Transfer: Other (comment) (unable:  attempted to stand with A x 2. ) Toilet Transfer Method: Other (comment) (used maximove to chair/ squat pivot not safe today) Toileting - Clothing Manipulation: Simulated;+2 Total assistance;Comment for patient % (0) Where Assessed - Toileting Clothing Manipulation: Rolling right and/or left Toileting - Hygiene: Simulated;+2 Total assistance;Comment for patient % (0) Where Assessed - Toileting Hygiene: Rolling right and/or left Equipment Used: Other (comment) (maximove) ADL Comments: pt fearful of moving:  unable to safely perform squat pivot with thps Vision/Perception  Vision - History Patient Visual Report: No change from baseline Cognition Cognition Overall Cognitive Status: Appears within  functional limits for tasks assessed Sensation/Coordination   Extremity Assessment RUE Assessment RUE Assessment: Within Functional Limits LUE Assessment LUE Assessment: Within Functional Limits Mobility  Bed Mobility Bed Mobility: Yes Supine to Sit: 1: +2 Total assist;Patient percentage (comment) (10) Sit  to Supine: 2: Max assist Exercises   End of Session OT - End of Session Equipment Utilized During Treatment: Gait belt Patient left: in chair;with call bell in reach;Other (comment) (maximove pad under her) Nurse Communication: Mobility status for transfers;Other (comment) (maximove) General Behavior During Session: [facility] for tasks performed Cognition: [facility] for tasks performed (needs reinforcement for thps)  [name], OTR/L [phone] 06/08/2011   [name] 06/08/2011, 4:46 PM

## 2011-06-09 ENCOUNTER — Inpatient Hospital Stay (HOSPITAL_COMMUNITY): Payer: Medicare Other

## 2011-06-09 ENCOUNTER — Encounter (HOSPITAL_COMMUNITY): Payer: Self-pay

## 2011-06-09 LAB — BASIC METABOLIC PANEL
BUN: 9 mg/dL (ref 6–23)
CO2: 24 mEq/L (ref 19–32)
Chloride: 99 mEq/L (ref 96–112)
GFR calc Af Amer: >90 mL/min (ref >90)
Glucose, Bld: 108 mg/dL — ABNORMAL HIGH (ref 70–99)
Potassium: 3.6 mEq/L (ref 3.5–5.1)

## 2011-06-09 LAB — CBC
Hemoglobin: 8.2 g/dL — ABNORMAL LOW (ref 12.0–15.0)
MCH: 30.4 pg (ref 26.0–34.0)
MCHC: 35 g/dL (ref 30.0–36.0)
RDW: 13.4 % (ref 11.5–15.5)

## 2011-06-09 LAB — SODIUM, URINE, RANDOM: Sodium, Ur: 11 mEq/L

## 2011-06-09 NOTE — Progress Notes (Signed)
Subjective: 3 Days Post-Op Procedure(s) (LRB): ARTHROPLASTY BIPOLAR HIP (Right) Patient reports pain as mild and moderate.   Patient seen in rounds with Dr. Lequita Halt. Patient has complaints of still with hip and shoulder pain.  Using sling for shoulder.  Asking to keep foley one more day.  Objective: Vital signs in last 24 hours: Temp:  [98 F (36.7 C)-100 F (37.8 C)] 98 F (36.7 C) (01/16 1003) Pulse Rate:  [79-99] 80  (01/16 1003) Resp:  [18-20] 18  (01/16 1003) BP: (96-149)/(58-78) 145/70 mmHg (01/16 1003) SpO2:  [96 %-100 %] 99 % (01/16 1003)  Intake/Output from previous day:  Intake/Output Summary (Last 24 hours) at 06/09/11 1044 Last data filed at 06/09/11 0845  Gross per 24 hour  Intake 2063.75 ml  Output   1850 ml  Net 213.75 ml    Intake/Output this shift: Total I/O In: 240 [P.O.:240] Out: -   Labs:  Basename 06/09/11 0537 06/08/11 0534 06/08/11 0005 06/07/11 0553  HGB 8.2* 9.5* 9.5* 7.9*    Basename 06/09/11 0537 06/08/11 0534  WBC 8.7 11.1*  RBC 2.70* 3.15*  HCT 23.4* 27.0*  PLT 147* 133*    Basename 06/09/11 0537 06/08/11 0534  NA 129* 127*  K 3.6 3.5  CL 99 97  CO2 24 21  BUN 9 10  CREATININE 0.53 0.55  GLUCOSE 108* 114*  CALCIUM 7.7* 7.7*   No results found for this basename: LABPT:2,INR:2 in the last 72 hours  Exam - Neurovascular intact Sensation intact distally Dressing/Incision - clean, dry, no drainage Motor function intact - moving foot and toes well on exam.   Right Upper Ext - NVI, moving fingers well  Past Medical History  Diagnosis Date  . Hypertension   . Myocardial infarction     Assessment/Plan: 3 Days Post-Op Procedure(s) (LRB): ARTHROPLASTY BIPOLAR HIP (Right)  Up with therapy Discharge to SNF  DVT Prophylaxis - Lovenox WBAT  Rolly Magri 06/09/2011, 10:44 AM

## 2011-06-09 NOTE — Progress Notes (Signed)
Subjective: Patient seen and examined,feeling better today ,pain is well controlled  Objective: Vital signs in last 24 hours: Temp:  [98 F (36.7 C)-100 F (37.8 C)] 98.8 F (37.1 C) (01/16 1427) Pulse Rate:  [79-99] 87  (01/16 1427) Resp:  [18-20] 18  (01/16 1427) BP: (96-152)/(54-78) 152/54 mmHg (01/16 1427) SpO2:  [96 %-100 %] 100 % (01/16 1427) Weight change:  Last BM Date: 06/05/11  Intake/Output from previous day: 01/15 0701 - 01/16 0700 In: 1826.8 [I.V.:1826.8] Out: 3600 [Urine:3600] Total I/O In: 480 [P.O.:480] Out: -    Physical Exam: General: Alert, awake, oriented x3, in no acute distress. HEENT: No bruits, no goiter. Heart: Regular rate and rhythm, without murmurs, rubs, gallops. Lungs: Clear to auscultation bilaterally. Abdomen: Soft, nontender, nondistended, positive bowel sounds. Extremities: No clubbing cyanosis or edema with positive pedal pulses.Right arm in sling  Neuro: Grossly intact, nonfocal.    Lab Results: Results for orders placed during the hospital encounter of 06/05/11 (from the past 24 hour(s))  SODIUM, URINE, RANDOM     Status: Normal   Collection Time   06/08/11  9:40 PM      Component Value Range   Sodium, Ur 11    OSMOLALITY, URINE     Status: Normal   Collection Time   06/08/11  9:40 PM      Component Value Range   Osmolality, Ur 535  390 - 1090 (mOsm/kg)  CBC     Status: Abnormal   Collection Time   06/09/11  5:37 AM      Component Value Range   WBC 8.7  4.0 - 10.5 (K/uL)   RBC 2.70 (*) 3.87 - 5.11 (MIL/uL)   Hemoglobin 8.2 (*) 12.0 - 15.0 (g/dL)   HCT 16.1 (*) 09.6 - 46.0 (%)   MCV 86.7  78.0 - 100.0 (fL)   MCH 30.4  26.0 - 34.0 (pg)   MCHC 35.0  30.0 - 36.0 (g/dL)   RDW 04.5  40.9 - 81.1 (%)   Platelets 147 (*) 150 - 400 (K/uL)  BASIC METABOLIC PANEL     Status: Abnormal   Collection Time   06/09/11  5:37 AM      Component Value Range   Sodium 129 (*) 135 - 145 (mEq/L)   Potassium 3.6  3.5 - 5.1 (mEq/L)   Chloride 99   96 - 112 (mEq/L)   CO2 24  19 - 32 (mEq/L)   Glucose, Bld 108 (*) 70 - 99 (mg/dL)   BUN 9  6 - 23 (mg/dL)   Creatinine, Ser 9.14  0.50 - 1.10 (mg/dL)   Calcium 7.7 (*) 8.4 - 10.5 (mg/dL)   GFR calc non Af Amer 87 (*) >90 (mL/min)   GFR calc Af Amer >90  >90 (mL/min)    Studies/Results: Dg Forearm Right  06/07/2011  *RADIOLOGY REPORT*  Clinical Data: Pain, injured following  RIGHT FOREARM - 2 VIEW  Comparison: None  Findings: Diffuse osseous demineralization. Volar plate and multiple screws identified at distal radius post ORIF. Small elbow joint effusion. No acute fracture, dislocation or bone destruction is seen.  IMPRESSION: Osseous demineralization. Post ORIF of distal right radial fracture. No definite acute bony abnormalities identified, though a small elbow joint effusion is present.  Original Report Authenticated By: Lollie Marrow, M.D.   Dg Humerus Right  06/07/2011  *RADIOLOGY REPORT*  Clinical Data: Severe arm pain, injured trying to catch herself falling 2 days ago  RIGHT HUMERUS - 2+ VIEW  Comparison: Right shoulder  radiographs 06/05/2011  Findings: Osseous mineralization diffusely diminished. AC joint alignment normal. Small displaced fragment identified at surgical neck of right humerus consistent with fracture. No dislocation or additional fracture identified.  IMPRESSION: Nondisplaced fracture at surgical neck of right humerus.  Original Report Authenticated By: Lollie Marrow, M.D.    Medications:    . aspirin  81 mg Oral Daily  . cholecalciferol  1,000 Units Oral QHS  . docusate sodium  100 mg Oral BID  . enoxaparin  40 mg Subcutaneous Q24H  . ezetimibe  10 mg Oral QPM  . lisinopril  20 mg Oral Daily  . metoprolol succinate  25 mg Oral Q24H  . multivitamin-lutein  1 capsule Oral BID  . rosuvastatin  5 mg Oral 2 times weekly  . DISCONTD: sodium chloride  3 mL Intravenous Q12H    acetaminophen, acetaminophen, bisacodyl, menthol-cetylpyridinium, methocarbamol(ROBAXIN)  IV, methocarbamol, metoCLOPramide (REGLAN) injection, metoCLOPramide, morphine, ondansetron (ZOFRAN) IV, ondansetron, phenol, polyethylene glycol, traMADol, DISCONTD: sodium chloride, DISCONTD: sodium chloride     . DISCONTD: sodium chloride 75 mL/hr at 06/09/11 0606  . DISCONTD: sodium chloride 75 mL/hr at 06/06/11 1709    Assessment/Plan:  1. Right femoral neck fracture/Right humeral head fracture   S/P right hip hemiarthroplasty POD#3 Right humeral  fracture being treated conservatively.continue pain control and  PT   2. Anemia:  Due to acute blood loss and hemodilution S/P transfusion of 2 units PRBCs 1/14, check CBC in am  3. HX of CAD S/P metal stent:  plavix  on hold as per cardiology recommendations ,continue  ASA 81 mg.continue BB,statin ,ACEI .d  4. Hyponatremia: chronic ,likely SIADH, stop IVF, asymptomatic   5. HTN : controlled ,continue meds  6.DVT prophylaxis : On Lovenox and SCD  Disposition: CSW working on placement      LOS: 4 days   Lucianne Smestad 06/09/2011, 2:49 PM

## 2011-06-09 NOTE — ED Provider Notes (Signed)
Medical screening examination/treatment/procedure(s) were conducted as a shared visit with non-physician practitioner(s) and myself.  I personally evaluated the patient during the encounter  S/p fall. +Rt hip ttp and pain with int/ext rotation. Found to have Rt hip fracture. PA D/W Orthopedics. Patient to be admitted to hospitalist.    Forbes Cellar, MD 06/09/11 [phone]

## 2011-06-09 NOTE — Progress Notes (Addendum)
CSW met with patient and patients daughter at bedside. Patient is alert and oriented x3.  Discussed need for SNF. Patient is agreeable to same. Requesting that CSW fax patient out and bring them bed offers so that they can discuss rehab choices with patients surgeon. Psychosocial assessment completed and placed in shadow chart. FL-2 completed. Patient faxed out. Awaiting bed offers. Ayiden Milliman C. Lorriane Dehart MSW, LCSW [phone]

## 2011-06-09 NOTE — Progress Notes (Signed)
Physical Therapy Treatment Patient Details Name: [name] MRN: [id number] DOB: 30-Nov-1928 Today's Date: 06/09/2011  R Hemi arthroplasy 2nd fall/fx POD # 3 R humerus fx w/ sling 10:00 - 10:30 2 ta  PT Assessment/Plan  PT - Assessment/Plan Comments on Treatment Session: Pt tends to repeat, "wait a minute".  Required increased time and positive reassurance.  Pt plans to D/C to SNF PT Plan: Discharge plan remains appropriate Follow Up Recommendations: Skilled nursing facility Equipment Recommended: Defer to next venue PT Goals  Acute Rehab PT Goals PT Goal Formulation: With patient Pt will go Supine/Side to Sit: with mod assist PT Goal: Supine/Side to Sit - Progress: Progressing toward goal Pt will go Sit to Supine/Side: with mod assist PT Goal: Sit to Supine/Side - Progress: Progressing toward goal Pt will Transfer Bed to Chair/Chair to Bed: with mod assist PT Transfer Goal: Bed to Chair/Chair to Bed - Progress: Progressing toward goal Pt will Stand: with mod assist PT Goal: Stand - Progress: Progressing toward goal Pt will Perform Home Exercise Program: with supervision, verbal cues required/provided PT Goal: Perform Home Exercise Program - Progress: Progressing toward goal  PT Treatment Precautions/Restrictions  Precautions Precautions: Posterior Hip Precaution Booklet Issued: No Precaution Comments: educated patient verbal and demo regarding hip precautions Required Braces or Orthoses: Yes Other Brace/Splint: rue sling Restrictions Weight Bearing Restrictions: Yes RLE Weight Bearing: Non weight bearing Other Position/Activity Restrictions: RLE; RUE--non weightbearing:  non displaced fx Mobility (including Balance) Bed Mobility Bed Mobility: Yes Supine to Sit: 1: +2 Total assist Supine to Sit Details (indicate cue type and reason): total assist + 2 pt < 25% Sitting - Scoot to Edge of Bed: 1: +2 Total assist Sitting - Scoot to Edge of Bed Details (indicate cue  type and reason): total assist + 2 pt < 25% and increased time Transfers Transfers: Yes Squat Pivot Transfers: 1: +2 Total assist Squat Pivot Transfer Details (indicate cue type and reason): total assist + 2 pt 25% squat pivot no AD from bed to chair pivoting to her left Ambulation/Gait Ambulation/Gait: No Stairs: No Wheelchair Mobility Wheelchair Mobility: No    Exercise  Total Joint Exercises Ankle Circles/Pumps: AROM;Both;10 reps Long Arc Quad: AROM;Right;Seated;10 reps End of Session PT - End of Session Equipment Utilized During Treatment: Gait belt Activity Tolerance: Patient limited by pain Patient left: in chair;with call bell in reach Nurse Communication: Mobility status for transfers;Need for lift equipment General Behavior During Session:  (some anxiety/fear of falling/fear of pain) Cognition: WFL for tasks performed (A x O x 4)  Felecia Shelling  PTA [facility]  Acute  Rehab Pager     [phone]

## 2011-06-10 ENCOUNTER — Encounter (HOSPITAL_COMMUNITY): Payer: Self-pay

## 2011-06-10 LAB — URINE MICROSCOPIC-ADD ON

## 2011-06-10 LAB — URINALYSIS, ROUTINE W REFLEX MICROSCOPIC
Glucose, UA: NEGATIVE mg/dL
Protein, ur: 30 mg/dL — AB
Specific Gravity, Urine: 1.02 (ref 1.005–1.030)

## 2011-06-10 LAB — CBC
HCT: 23.8 % — ABNORMAL LOW (ref 36.0–46.0)
Hemoglobin: 8.2 g/dL — ABNORMAL LOW (ref 12.0–15.0)
MCHC: 34.5 g/dL (ref 30.0–36.0)
MCV: 87.5 fL (ref 78.0–100.0)

## 2011-06-10 MED ORDER — OXYCODONE-ACETAMINOPHEN 5-325 MG PO TABS
1.0000 | ORAL_TABLET | ORAL | Status: DC | PRN
  Administered 2011-06-10 – 2011-06-11 (×5): 1 via ORAL
  Filled 2011-06-10 (×5): qty 1

## 2011-06-10 NOTE — Progress Notes (Signed)
Subjective: Patient seen and examined,feeling better today ,pain is well controlled  Objective: Vital signs in last 24 hours: Temp:  [99.2 F (37.3 C)-102.1 F (38.9 C)] 100 F (37.8 C) (01/17 0527) Pulse Rate:  [89-101] 89  (01/17 0527) Resp:  [18-20] 18  (01/17 1200) BP: (139-159)/(49-78) 158/54 mmHg (01/17 0527) SpO2:  [95 %-100 %] 98 % (01/17 1200) Weight change:  Last BM Date: 06/10/11  Intake/Output from previous day: 01/16 0701 - 01/17 0700 In: 720 [P.O.:720] Out: 1600 [Urine:1600]     Physical Exam: General: Alert, awake, oriented x3, in no acute distress. HEENT: No bruits, no goiter. Heart: Regular rate and rhythm, without murmurs, rubs, gallops. Lungs: Clear to auscultation bilaterally. Abdomen: Soft, nontender, nondistended, positive bowel sounds. Extremities: No clubbing cyanosis or edema with positive pedal pulses.Right arm in sling  Neuro: Grossly intact, nonfocal.    Lab Results: Results for orders placed during the hospital encounter of 06/05/11 (from the past 24 hour(s))  CBC     Status: Abnormal   Collection Time   06/10/11  5:02 AM      Component Value Range   WBC 7.4  4.0 - 10.5 (K/uL)   RBC 2.72 (*) 3.87 - 5.11 (MIL/uL)   Hemoglobin 8.2 (*) 12.0 - 15.0 (g/dL)   HCT 96.0 (*) 45.4 - 46.0 (%)   MCV 87.5  78.0 - 100.0 (fL)   MCH 30.1  26.0 - 34.0 (pg)   MCHC 34.5  30.0 - 36.0 (g/dL)   RDW 09.8  11.9 - 14.7 (%)   Platelets 182  150 - 400 (K/uL)  URINALYSIS, ROUTINE W REFLEX MICROSCOPIC     Status: Abnormal   Collection Time   06/10/11 12:49 PM      Component Value Range   Color, Urine AMBER (*) YELLOW    APPearance CLOUDY (*) CLEAR    Specific Gravity, Urine 1.020  1.005 - 1.030    pH 6.0  5.0 - 8.0    Glucose, UA NEGATIVE  NEGATIVE (mg/dL)   Hgb urine dipstick LARGE (*) NEGATIVE    Bilirubin Urine NEGATIVE  NEGATIVE    Ketones, ur NEGATIVE  NEGATIVE (mg/dL)   Protein, ur 30 (*) NEGATIVE (mg/dL)   Urobilinogen, UA 1.0  0.0 - 1.0 (mg/dL)   Nitrite NEGATIVE  NEGATIVE    Leukocytes, UA SMALL (*) NEGATIVE   URINE MICROSCOPIC-ADD ON     Status: Abnormal   Collection Time   06/10/11 12:49 PM      Component Value Range   Squamous Epithelial / LPF RARE  RARE    WBC, UA 3-6  <3 (WBC/hpf)   RBC / HPF 11-20  <3 (RBC/hpf)   Bacteria, UA FEW (*) RARE     Studies/Results: Dg Chest 1 View  06/10/2011  *RADIOLOGY REPORT*  Clinical Data: Weakness, fever, cough  CHEST - 1 VIEW  Comparison: June 05, 2011  Findings: Mild cardiomegaly is unchanged.  There is pulmonary vascular cephalization without frank edema.  There is blunting of the left costophrenic angle which suggests a small effusion.  No focal infiltrates are identified.  IMPRESSION: Cardiomegaly and pulmonary vascular cephalization with small left pleural effusion.  Original Report Authenticated By: Brandon Melnick, M.D.    Medications:    . aspirin  81 mg Oral Daily  . cholecalciferol  1,000 Units Oral QHS  . docusate sodium  100 mg Oral BID  . enoxaparin  40 mg Subcutaneous Q24H  . ezetimibe  10 mg Oral QPM  . lisinopril  20 mg Oral Daily  . metoprolol succinate  25 mg Oral Q24H  . multivitamin-lutein  1 capsule Oral BID  . rosuvastatin  5 mg Oral 2 times weekly    acetaminophen, acetaminophen, bisacodyl, menthol-cetylpyridinium, methocarbamol(ROBAXIN) IV, methocarbamol, metoCLOPramide (REGLAN) injection, metoCLOPramide, morphine, ondansetron (ZOFRAN) IV, ondansetron, oxyCODONE-acetaminophen, phenol, polyethylene glycol, traMADol     Assessment/Plan:  1. Right femoral neck fracture/Right humeral head fracture   S/P right hip hemiarthroplasty POD#4 Right humeral  fracture being treated conservatively.continue pain control and  PT   2. Anemia:  Due to acute blood loss and hemodilution S/P transfusion of 2 units PRBCs 1/14, Hb stable since yesterday, check CBC in am  3. HX of CAD S/P metal stent:  plavix  on hold as per cardiology recommendations ,continue  ASA 81  mg.continue BB,statin ,ACEI  4. Hyponatremia: chronic ,likely SIADH, stop IVF, asymptomatic   5. Fever: Is secondary to atelectasis , continue use of incentive spirometer every hours, we'll also check UA and DC Foley catheter today, chest x-ray unremarkable  6. HTN : controlled ,continue meds   7.DVT prophylaxis : On Lovenox and SCD  Disposition: CSW working on placement  along with family SNF 1/18 if no further issues   LOS: 5 days   [name] 06/10/2011, 2:47 PM

## 2011-06-10 NOTE — Progress Notes (Signed)
Subjective: 4 Days Post-Op Procedure(s) (LRB): ARTHROPLASTY BIPOLAR HIP (Right) Patient reports pain as mild and moderate.   Patient seen in rounds with Dr. Lequita Halt. Patient has complaints of a rough night but doing better this morning.  She will need SNF.  From an orthopedic standpoint, she will need continued skilled care due to the hip and shoulder fracture.  The shoulder will be treated conservative for now.  Sling for immobilization and will get follow up xrays on an outpatient basis.  She can be WBAT to the right leg for the hip surgery.  The patient and her son are interested in looking into [facility] or [facility] for inpatient rehab following her hospital stay.  Her final release will be determined by the attending Hospitalist service but we will continue to follow while she is here.  Objective: Vital signs in last 24 hours: Temp:  [98 F (36.7 C)-102.1 F (38.9 C)] 100 F (37.8 C) (01/17 0527) Pulse Rate:  [80-101] 89  (01/17 0527) Resp:  [18-20] 18  (01/17 0800) BP: (139-159)/(49-78) 158/54 mmHg (01/17 0527) SpO2:  [95 %-100 %] 98 % (01/17 0800)  Intake/Output from previous day:  Intake/Output Summary (Last 24 hours) at 06/10/11 0952 Last data filed at 06/10/11 0700  Gross per 24 hour  Intake    480 ml  Output   1600 ml  Net  -1120 ml    Intake/Output this shift:    Labs:  Basename 06/10/11 0502 06/09/11 0537 06/08/11 0534 06/08/11 0005  HGB 8.2* 8.2* 9.5* 9.5*    Basename 06/10/11 0502 06/09/11 0537  WBC 7.4 8.7  RBC 2.72* 2.70*  HCT 23.8* 23.4*  PLT 182 147*    Basename 06/09/11 0537 06/08/11 0534  NA 129* 127*  K 3.6 3.5  CL 99 97  CO2 24 21  BUN 9 10  CREATININE 0.53 0.55  GLUCOSE 108* 114*  CALCIUM 7.7* 7.7*   No results found for this basename: LABPT:2,INR:2 in the last 72 hours  Exam - Neurovascular intact Sensation intact distally right leg Dressing/Incision - clean, dry, scant serosanguineous drainage from incision  Motor function  intact - moving foot and toes well on exam.   Right Upper Ext. - motor intact and sensation intact to right arm.  Sling in place.  Past Medical History  Diagnosis Date  . Hypertension   . Myocardial infarction     Assessment/Plan: 4 Days Post-Op Procedure(s) (LRB): ARTHROPLASTY BIPOLAR HIP (Right)  Right Humerus Fracture - sling, non surgical treatment.  Discharge to SNF when medically stable  DVT Prophylaxis - Lovenox WBAT to right leg  PERKINS, ALEXZANDREW 06/10/2011, 9:52 AM

## 2011-06-10 NOTE — Progress Notes (Signed)
Physical Therapy Treatment Patient Details Name: [name] MRN: [id number] DOB: [birth year] Today's Date: 06/10/2011  R hemiarthroplasty 2nd fall/Fx plus R humerus non displaced Fx 14:40 - 15:10 1 ta  1 te  PT Assessment/Plan  PT - Assessment/Plan Comments on Treatment Session: Pt feeling better. Moving with less fear/pain.  Pt plans to D/C to SNF PT Plan: Discharge plan remains appropriate Follow Up Recommendations: Skilled nursing facility Equipment Recommended: Defer to next venue PT Goals  Acute Rehab PT Goals PT Goal Formulation: With patient Pt will go Supine/Side to Sit: with mod assist PT Goal: Supine/Side to Sit - Progress: Progressing toward goal Pt will go Sit to Supine/Side: with mod assist PT Goal: Sit to Supine/Side - Progress: Progressing toward goal Pt will Transfer Bed to Chair/Chair to Bed: with mod assist PT Transfer Goal: Bed to Chair/Chair to Bed - Progress: Progressing toward goal Pt will Stand: with mod assist PT Goal: Stand - Progress: Progressing toward goal Pt will Perform Home Exercise Program: with supervision, verbal cues required/provided PT Goal: Perform Home Exercise Program - Progress: Progressing toward goal  PT Treatment Precautions/Restrictions  Precautions Precautions: Posterior Hip Precaution Booklet Issued: No Precaution Comments: educated patient verbal and demo regarding hip precautions Required Braces or Orthoses: Yes Other Brace/Splint: rue sling Restrictions Weight Bearing Restrictions: Yes RUE Weight Bearing: Non weight bearing (sling) RLE Weight Bearing: Weight bearing as tolerated Other Position/Activity Restrictions: RLE; RUE--non weightbearing:  non displaced fx Mobility (including Balance) Bed Mobility Bed Mobility: Yes Supine to Sit: 1: +2 Total assist Supine to Sit Details (indicate cue type and reason): total assisty + 2 pt 35% with increased time and 50% VC's on proper tech to minimize  pain Transfers Transfers: Yes Sit to Stand: 1: +2 Total assist Sit to Stand Details (indicate cue type and reason): total assist + @ elevated bed pt 25% partial stance witth 75% VC's on hand placement and increased posture Stand to Sit: 1: +2 Total assist Stand to Sit Details: total assist + 2 pt 40%  with 75% VC's on turn completeion and hand placement prior to sit Ambulation/Gait Ambulation/Gait: Yes Ambulation/Gait Assistance: 1: +2 Total assist Ambulation/Gait Assistance Details (indicate cue type and reason): total assist + 2 pt ,25% 3 tiny short steps no AD great difficulty 2nd NWB R UE.  #rd assist following with chair and brought behind Ambulation Distance (Feet): 1 Feet Gait Pattern: Step-to pattern;Trunk flexed;Decreased weight shift to right;Decreased stance time - right Stairs: No Wheelchair Mobility Wheelchair Mobility: No    Exercise  Total Joint Exercises Ankle Circles/Pumps: AROM;10 reps;Both Quad Sets: AROM;Both;10 reps Gluteal Sets: AROM;Both;10 reps Towel Squeeze: AROM;Both;10 reps Short Arc Quad: AROM;Right;10 reps Heel Slides: AAROM;Right;10 reps Hip ABduction/ADduction: AAROM;Right;10 reps End of Session PT - End of Session Equipment Utilized During Treatment: Gait belt Activity Tolerance: Patient tolerated treatment well Patient left: in chair;with call bell in reach Nurse Communication: Mobility status for transfers;Need for lift equipment General Behavior During Session: [facility] for tasks performed Cognition: [facility] for tasks performed  Felecia Shelling  PTA [facility]  Acute  Rehab Pager     [phone]

## 2011-06-10 NOTE — Progress Notes (Signed)
   CARE MANAGEMENT NOTE 06/10/2011  Patient:  [name], [name]   Account Number:  [id number]  Date Initiated:  06/07/2011  Documentation initiated by:  Lanier Clam  Subjective/Objective Assessment:   ADMITTED W/FALL.R HIP FX.HX: MI,STENT 2008.     Action/Plan:   FROM HOME W/FAMILY.   Anticipated DC Date:  06/11/2011   Anticipated DC Plan:  SKILLED NURSING FACILITY  In-house referral  Clinical Social Worker         Choice offered to / List presented to:  C-1 Patient           Status of service:  Completed, signed off Medicare Important Message given?   (If response is "NO", the following Medicare IM given date fields will be blank) Date Medicare IM given:   Date Additional Medicare IM given:    Discharge Disposition:  SKILLED NURSING FACILITY  Per UR Regulation:  Reviewed for med. necessity/level of care/duration of stay  Comments:  06/10/11 Shayanne Gomm RN,BSN NCM [phone] D/C PLANS SNF.  06/07/11 Cherice Glennie RN,BSN NCM [phone] NOTED PT-SNF. SW NOTIFIED. PT/OT EVAL PEND.POD#2.

## 2011-06-11 LAB — CBC
Hemoglobin: 8.3 g/dL — ABNORMAL LOW (ref 12.0–15.0)
Platelets: 193 10*3/uL (ref 150–400)
RBC: 2.78 MIL/uL — ABNORMAL LOW (ref 3.87–5.11)
RDW: 13.1 % (ref 11.5–15.5)
WBC: 6.4 10*3/uL (ref 4.0–10.5)

## 2011-06-11 MED ORDER — ENOXAPARIN SODIUM 40 MG/0.4ML ~~LOC~~ SOLN: 40.0000 mg | SUBCUTANEOUS | Status: AC

## 2011-06-11 MED ORDER — GUAIFENESIN 100 MG/5ML PO SOLN: 5.0000 mL | ORAL | Status: DC | PRN

## 2011-06-11 MED ORDER — BENZONATATE 100 MG PO CAPS: 100.0000 mg | ORAL_CAPSULE | Freq: Three times a day (TID) | ORAL | Status: DC | PRN

## 2011-06-11 MED ORDER — ALBUTEROL SULFATE (5 MG/ML) 0.5% IN NEBU
2.5000 mg | INHALATION_SOLUTION | RESPIRATORY_TRACT | Status: DC | PRN
  Administered 2011-06-11 (×2): 2.5 mg via RESPIRATORY_TRACT
  Filled 2011-06-11 (×2): qty 0.5

## 2011-06-11 MED ORDER — BENZONATATE 100 MG PO CAPS
100.0000 mg | ORAL_CAPSULE | Freq: Three times a day (TID) | ORAL | Status: DC | PRN
  Administered 2011-06-11 – 2011-06-12 (×3): 100 mg via ORAL
  Filled 2011-06-11 (×3): qty 1

## 2011-06-11 MED ORDER — ALBUTEROL SULFATE (5 MG/ML) 0.5% IN NEBU
2.5000 mg | INHALATION_SOLUTION | Freq: Three times a day (TID) | RESPIRATORY_TRACT | Status: DC
  Administered 2011-06-11 – 2011-06-13 (×8): 2.5 mg via RESPIRATORY_TRACT
  Filled 2011-06-11 (×12): qty 0.5

## 2011-06-11 MED ORDER — ALBUTEROL SULFATE HFA 108 (90 BASE) MCG/ACT IN AERS: 2.0000 | INHALATION_SPRAY | RESPIRATORY_TRACT | Status: DC | PRN

## 2011-06-11 MED ORDER — OXYCODONE-ACETAMINOPHEN 5-325 MG PO TABS: 1.0000 | ORAL_TABLET | ORAL | Status: DC | PRN

## 2011-06-11 MED ORDER — IPRATROPIUM BROMIDE 0.02 % IN SOLN
0.5000 mg | Freq: Three times a day (TID) | RESPIRATORY_TRACT | Status: DC
  Administered 2011-06-11 – 2011-06-13 (×8): 0.5 mg via RESPIRATORY_TRACT
  Filled 2011-06-11 (×12): qty 2.5

## 2011-06-11 MED ORDER — DSS 100 MG PO CAPS: 100.0000 mg | ORAL_CAPSULE | Freq: Two times a day (BID) | ORAL | Status: AC | PRN

## 2011-06-11 MED ORDER — POLYETHYLENE GLYCOL 3350 17 G PO PACK: 17.0000 g | PACK | Freq: Every day | ORAL | Status: AC | PRN

## 2011-06-11 MED ORDER — IPRATROPIUM BROMIDE 0.02 % IN SOLN
0.5000 mg | RESPIRATORY_TRACT | Status: DC | PRN
  Administered 2011-06-11 (×2): 0.5 mg via RESPIRATORY_TRACT
  Filled 2011-06-11 (×3): qty 2.5

## 2011-06-11 NOTE — Progress Notes (Signed)
Physical Therapy Treatment Patient Details Name: [name] MRN: [id number] DOB: 1928-08-22 Today's Date: 06/11/2011  R hemiarthroplasty WBAT 2nd fall/fx and R prox humerus non displaced fx NWB (sling) 14:35 - 15:00 2 ta  PT Assessment/Plan  PT - Assessment/Plan Comments on Treatment Session: Pt plans to D/C to [facility] when medically clear PT Plan: Discharge plan remains appropriate Follow Up Recommendations: Skilled nursing facility Equipment Recommended: Defer to next venue PT Goals  Acute Rehab PT Goals PT Goal Formulation: With patient Pt will go Supine/Side to Sit: with mod assist PT Goal: Supine/Side to Sit - Progress: Progressing toward goal Pt will go Sit to Supine/Side: with mod assist PT Goal: Sit to Supine/Side - Progress: Progressing toward goal Pt will Transfer Bed to Chair/Chair to Bed: with mod assist PT Transfer Goal: Bed to Chair/Chair to Bed - Progress: Progressing toward goal Pt will Stand: with mod assist PT Goal: Stand - Progress: Progressing toward goal Pt will Perform Home Exercise Program: with supervision, verbal cues required/provided PT Goal: Perform Home Exercise Program - Progress: Progressing toward goal  PT Treatment Precautions/Restrictions  Precautions Precautions: Posterior Hip Precaution Booklet Issued: No Precaution Comments: educated patient verbal and demo regarding hip precautions Required Braces or Orthoses: Yes Other Brace/Splint: rue sling Restrictions Weight Bearing Restrictions: Yes RUE Weight Bearing: Non weight bearing RLE Weight Bearing: Weight bearing as tolerated Other Position/Activity Restrictions: R UE sling Mobility (including Balance) Bed Mobility Bed Mobility: Yes Rolling Right: 1: +2 Total assist Rolling Right Details (indicate cue type and reason): Total assist + 2 pt 25% to use bed pan Supine to Sit: 1: +2 Total assist Supine to Sit Details (indicate cue type and reason): total assist + 2 pt 40% with  increased time and HOB elevated 45' Sitting - Scoot to Edge of Bed: 1: +2 Total assist Sitting - Scoot to Edge of Bed Details (indicate cue type and reason): Total assist + 2 pt 25% with increased time and great difficulty 2nd NWB R UE Transfers Transfers: Yes Squat Pivot Transfers: 1: +2 Total assist Squat Pivot Transfer Details (indicate cue type and reason): total assist + 2 pt 25% partial stance and pivot towards her Left Ambulation/Gait Ambulation/Gait: No    Exercise  Total Joint Exercises Ankle Circles/Pumps: AROM;Both;10 reps Quad Sets: AROM;Both;10 reps Gluteal Sets: AROM;Both;10 reps End of Session PT - End of Session Equipment Utilized During Treatment: Gait belt Activity Tolerance: Patient tolerated treatment well Patient left: in chair;with call bell in reach;with family/visitor present Nurse Communication: Need for lift equipment;Mobility status for transfers General Behavior During Session: [facility] for tasks performed Cognition: [facility] for tasks performed  Felecia Shelling  PTA [facility]  Acute  Rehab Pager     [phone]

## 2011-06-11 NOTE — Progress Notes (Signed)
Patient for discharge on Saturday. [name] will accept on Saturday per [name] as CSW faxed discharge meds. Passar completed. FL-2 completed and signed.  [name] MSW, LCSW [phone]

## 2011-06-11 NOTE — Progress Notes (Signed)
Subjective: Patient seen and examined, feeling better today, had wheezing overnight and is coughing a little bit ,pain is well controlled  Objective: Vital signs in last 24 hours: Temp:  [98.9 F (37.2 C)-100.1 F (37.8 C)] 98.9 F (37.2 C) (01/18 0545) Pulse Rate:  [18-91] 85  (01/18 0545) Resp:  [14-18] 18  (01/18 1200) BP: (112-189)/(57-73) 112/57 mmHg (01/18 0545) SpO2:  [94 %-100 %] 94 % (01/18 1200) FiO2 (%):  [1 %] 1 % (01/18 0205) Weight change:  Last BM Date: 06/11/11  Intake/Output from previous day: 01/17 0701 - 01/18 0700 In: -  Out: 1201 [Urine:1200; Stool:1] Total I/O In: 120 [P.O.:120] Out: 101 [Urine:100; Stool:1]   Physical Exam: General: Alert, awake, oriented x3, in no acute distress. HEENT: No bruits, no goiter. Heart: Regular rate and rhythm, without murmurs, rubs, gallops. Lungs: Scant bibasal crackles Abdomen: Soft, nontender, nondistended, positive bowel sounds. Extremities: No clubbing cyanosis or edema with positive pedal pulses.Right arm in sling  Neuro: Grossly intact, nonfocal.  Lab Results: Results for orders placed during the hospital encounter of 06/05/11 (from the past 24 hour(s))  CBC     Status: Abnormal   Collection Time   06/11/11  5:17 AM      Component Value Range   WBC 6.4  4.0 - 10.5 (K/uL)   RBC 2.51 (*) 3.87 - 5.11 (MIL/uL)   Hemoglobin 7.8 (*) 12.0 - 15.0 (g/dL)   HCT 16.1 (*) 09.6 - 46.0 (%)   MCV 87.6  78.0 - 100.0 (fL)   MCH 31.1  26.0 - 34.0 (pg)   MCHC 35.5  30.0 - 36.0 (g/dL)   RDW 04.5  40.9 - 81.1 (%)   Platelets 193  150 - 400 (K/uL)    Studies/Results: Dg Chest 1 View  06/10/2011  *RADIOLOGY REPORT*  Clinical Data: Weakness, fever, cough  CHEST - 1 VIEW  Comparison: June 05, 2011  Findings: Mild cardiomegaly is unchanged.  There is pulmonary vascular cephalization without frank edema.  There is blunting of the left costophrenic angle which suggests a small effusion.  No focal infiltrates are identified.   IMPRESSION: Cardiomegaly and pulmonary vascular cephalization with small left pleural effusion.  Original Report Authenticated By: Brandon Melnick, M.D.    Medications:    . albuterol  2.5 mg Nebulization TID  . aspirin  81 mg Oral Daily  . cholecalciferol  1,000 Units Oral QHS  . docusate sodium  100 mg Oral BID  . enoxaparin  40 mg Subcutaneous Q24H  . ezetimibe  10 mg Oral QPM  . ipratropium  0.5 mg Nebulization TID  . lisinopril  20 mg Oral Daily  . metoprolol succinate  25 mg Oral Q24H  . multivitamin-lutein  1 capsule Oral BID  . rosuvastatin  5 mg Oral 2 times weekly    acetaminophen, acetaminophen, albuterol, benzonatate, bisacodyl, guaiFENesin, ipratropium, menthol-cetylpyridinium, methocarbamol(ROBAXIN) IV, methocarbamol, metoCLOPramide (REGLAN) injection, metoCLOPramide, morphine, ondansetron (ZOFRAN) IV, ondansetron, oxyCODONE-acetaminophen, phenol, polyethylene glycol, traMADol     Assessment/Plan:  1. Right femoral neck fracture/Right humeral head fracture   S/P right hip hemiarthroplasty POD#5 Right humeral  fracture being treated conservatively.continue pain control, sling and PT  2. Anemia:  Due to postop acute blood loss and hemodilution S/P transfusion of 2 units PRBCs 1/14, Hb hemoglobin has trended down a little bit since yesterday we'll repeat again this evening and if it continues to decrease will transfuse 1 unit of PRBC overnight Also check CBC in am  3. HX of CAD S/P  metal stent:  plavix  on hold as per cardiology recommendations ,continue  ASA 81 mg.continue BB,statin ,ACEI  4. Hyponatremia: chronic ,likely SIADH, stop IVF, asymptomatic   5. Fever: Is secondary to atelectasis resolved,  continue use of incentive spirometer every hour  6. HTN : controlled ,continue meds   7.DVT prophylaxis : On Lovenox and SCD  Disposition: CSW working on placement  along with family SNF 1/19 if no further issues   LOS: 6 days   [name] 06/11/2011,  2:51 PM

## 2011-06-11 NOTE — Progress Notes (Signed)
Subjective: 5 Days Post-Op Procedure(s) (LRB): ARTHROPLASTY BIPOLAR HIP (Right) Patient reports pain as improved..   Patient seen in rounds with Dr. Lequita Halt. Patient is on the bedpan at this time.  Dr. Lequita Halt informed her that the path report was negative. Plan for SNF when medically stable.  From an orthopedic standpoint, she will need continued skilled care due to the hip and shoulder fracture. The shoulder will be treated conservative for now. Sling for immobilization and will get follow up xrays on an outpatient basis. She can be WBAT to the right leg for the hip surgery. The patient and her son are interested in looking into [facility] for inpatient rehab following her hospital stay. Her final release will be determined by the attending Hospitalist service but we will continue to follow while she is here.   Objective: Vital signs in last 24 hours: Temp:  [98.9 F (37.2 C)-100.1 F (37.8 C)] 98.9 F (37.2 C) (01/18 0545) Pulse Rate:  [18-91] 85  (01/18 0545) Resp:  [14-18] 18  (01/18 0545) BP: (112-189)/(57-73) 112/57 mmHg (01/18 0545) SpO2:  [96 %-100 %] 98 % (01/18 0553) FiO2 (%):  [1 %] 1 % (01/18 0205)  Intake/Output from previous day:  Intake/Output Summary (Last 24 hours) at 06/11/11 0906 Last data filed at 06/11/11 1191  Gross per 24 hour  Intake      0 ml  Output   1201 ml  Net  -1201 ml    Intake/Output this shift:    Labs:  Basename 06/11/11 0517 06/10/11 0502 06/09/11 0537  HGB 7.8* 8.2* 8.2*    Basename 06/11/11 0517 06/10/11 0502  WBC 6.4 7.4  RBC 2.51* 2.72*  HCT 22.0* 23.8*  PLT 193 182    Basename 06/09/11 0537  NA 129*  K 3.6  CL 99  CO2 24  BUN 9  CREATININE 0.53  GLUCOSE 108*  CALCIUM 7.7*   No results found for this basename: LABPT:2,INR:2 in the last 72 hours  Exam - Neurovascular intact Sensation intact distally to right lower and upper extremity. Dressing/Incision - clean, dry Motor function intact - moving foot and toes well  on exam.   Past Medical History  Diagnosis Date  . Hypertension   . Myocardial infarction     Assessment/Plan: 5 Days Post-Op Procedure(s) (LRB): ARTHROPLASTY BIPOLAR HIP (Right) Active Problems:  Postop Acute blood loss anemia  Postop Hyponatremia  Postop Transfusion history Right Humerus Fracture - sling, non surgical treatment.   Up with therapy Discharge to SNF when stable.  DVT Prophylaxis - Lovenox WBAT to right leg.  Sienna Stonehocker 06/11/2011, 9:06 AM

## 2011-06-12 LAB — CBC
HCT: 22.6 % — ABNORMAL LOW (ref 36.0–46.0)
Hemoglobin: 7.7 g/dL — ABNORMAL LOW (ref 12.0–15.0)
MCH: 30 pg (ref 26.0–34.0)
MCV: 87.9 fL (ref 78.0–100.0)
Platelets: 233 10*3/uL (ref 150–400)
Platelets: 254 10*3/uL (ref 150–400)
RBC: 2.56 MIL/uL — ABNORMAL LOW (ref 3.87–5.11)
RBC: 2.57 MIL/uL — ABNORMAL LOW (ref 3.87–5.11)
WBC: 7.7 10*3/uL (ref 4.0–10.5)
WBC: 8.1 10*3/uL (ref 4.0–10.5)

## 2011-06-12 LAB — TYPE AND SCREEN
Antibody Screen: NEGATIVE
Unit division: 0

## 2011-06-12 LAB — CREATININE, SERUM
Creatinine, Ser: 0.49 mg/dL — ABNORMAL LOW (ref 0.50–1.10)
GFR calc Af Amer: >90 mL/min (ref >90)
GFR calc non Af Amer: 90 mL/min — ABNORMAL LOW (ref >90)

## 2011-06-12 MED ORDER — FUROSEMIDE 10 MG/ML IJ SOLN
40.0000 mg | Freq: Once | INTRAMUSCULAR | Status: AC
  Administered 2011-06-12: 40 mg via INTRAVENOUS
  Filled 2011-06-12: qty 4

## 2011-06-12 NOTE — Progress Notes (Signed)
Subjective: 6 Days Post-Op Procedure(s) (LRB): ARTHROPLASTY BIPOLAR HIP (Right) Patient reports pain as 5 on 0-10 scale.   Denies CP or SOB.  Positive flatus.  She denies any dizziness or nausea. Family at bedside Objective: Vital signs in last 24 hours: Temp:  [98.4 F (36.9 C)-99.9 F (37.7 C)] 98.4 F (36.9 C) (01/19 0600) Pulse Rate:  [84-91] 84  (01/19 0600) Resp:  [16-18] 18  (01/19 0600) BP: (157-159)/(65-81) 157/65 mmHg (01/19 0600) SpO2:  [93 %-95 %] 94 % (01/19 0600)  Intake/Output from previous day: 01/18 0701 - 01/19 0700 In: 120 [P.O.:120] Out: 651 [Urine:650; Stool:1] Intake/Output this shift:     Basename 06/12/11 0540 06/11/11 1620 06/11/11 0517 06/10/11 0502  HGB 7.7* 8.3* 7.8* 8.2*    Basename 06/12/11 0540 06/11/11 1620  WBC 7.7 7.4  RBC 2.56* 2.78*  HCT 22.4* 24.3*  PLT 233 226    Basename 06/12/11 0540  NA --  K --  CL --  CO2 --  BUN --  CREATININE 0.49*  GLUCOSE --  CALCIUM --   No results found for this basename: LABPT:2,INR:2 in the last 72 hours  Neurologically intact Neurovascular intact Sensation intact distally Incision: no drainage Compartment soft Moderate swelling into right thigh Mild edema right UE, pt out of sling motor intact to hand good sensation Assessment/Plan: 6 Days Post-Op Procedure(s) (LRB): ARTHROPLASTY BIPOLAR HIP (Right) PT/OT Post operative anemia - will repeat labs at noon if down consider transfusion Daily dressing change Cont current care  Betsie Peckman R. 06/12/2011, 9:18 AM

## 2011-06-12 NOTE — Progress Notes (Signed)
CRITICAL VALUE ALERT  Critical value received:  CBC  Date of notification:  06-12-11   Time of notification:  1310  Critical value read back:yes  Nurse who received alert:  Carmon Ginsberg, RN  MD notified (1st page):  Dr. Jomarie Longs  Time of first page:  1310  MD notified (2nd page):n/a  Time of second page:n/a Responding MD:  Dr. Jomarie Longs  Time MD responded:  1330

## 2011-06-12 NOTE — Progress Notes (Signed)
Per MD, Pt not medically ready for d/c.  MD thinking that Pt would be ready on Mon.  Spoke with Romeo Apple at [facility].  Notified facility that Pt not ready over the weekend.  CSW to continue to follow.  Providence Crosby, LCSWA Clinical Social Work [phone]

## 2011-06-12 NOTE — Progress Notes (Signed)
Subjective: Doing well denies any new complaints, minimal bleeding noted from the dressing site Objective: Vital signs in last 24 hours: Temp:  [98.1 F (36.7 C)-99.9 F (37.7 C)] 98.1 F (36.7 C) (01/19 1401) Pulse Rate:  [84-91] 84  (01/19 1401) Resp:  [16-18] 18  (01/19 1401) BP: (157-166)/(65-81) 166/76 mmHg (01/19 1401) SpO2:  [93 %-96 %] 96 % (01/19 1401) Weight change:  Last BM Date: 06/11/11  Intake/Output from previous day: 01/18 0701 - 01/19 0700 In: 120 [P.O.:120] Out: 651 [Urine:650; Stool:1] Total I/O In: -  Out: 200 [Urine:200]   Physical Exam: General: Alert, awake, oriented x3, in no acute distress. HEENT: No bruits, no goiter. Heart: Regular rate and rhythm, without murmurs, rubs, gallops. Lungs: Scant bibasal crackles Abdomen: Soft, nontender, nondistended, positive bowel sounds. Extremities: Right lower extremity with 1+ edema, Right arm in sling  Neuro: Movement on the right side limited due to recent fracture  Lab Results: Results for orders placed during the hospital encounter of 06/05/11 (from the past 24 hour(s))  CBC     Status: Abnormal   Collection Time   06/11/11  4:20 PM      Component Value Range   WBC 7.4  4.0 - 10.5 (K/uL)   RBC 2.78 (*) 3.87 - 5.11 (MIL/uL)   Hemoglobin 8.3 (*) 12.0 - 15.0 (g/dL)   HCT 40.9 (*) 81.1 - 46.0 (%)   MCV 87.4  78.0 - 100.0 (fL)   MCH 29.9  26.0 - 34.0 (pg)   MCHC 34.2  30.0 - 36.0 (g/dL)   RDW 91.4  78.2 - 95.6 (%)   Platelets 226  150 - 400 (K/uL)  CREATININE, SERUM     Status: Abnormal   Collection Time   06/12/11  5:40 AM      Component Value Range   Creatinine, Ser 0.49 (*) 0.50 - 1.10 (mg/dL)   GFR calc non Af Amer 90 (*) >90 (mL/min)   GFR calc Af Amer >90  >90 (mL/min)  CBC     Status: Abnormal   Collection Time   06/12/11  5:40 AM      Component Value Range   WBC 7.7  4.0 - 10.5 (K/uL)   RBC 2.56 (*) 3.87 - 5.11 (MIL/uL)   Hemoglobin 7.7 (*) 12.0 - 15.0 (g/dL)   HCT 21.3 (*) 08.6 - 46.0  (%)   MCV 87.5  78.0 - 100.0 (fL)   MCH 30.1  26.0 - 34.0 (pg)   MCHC 34.4  30.0 - 36.0 (g/dL)   RDW 57.8  46.9 - 62.9 (%)   Platelets 233  150 - 400 (K/uL)  CBC     Status: Abnormal   Collection Time   06/12/11 12:07 PM      Component Value Range   WBC 8.1  4.0 - 10.5 (K/uL)   RBC 2.57 (*) 3.87 - 5.11 (MIL/uL)   Hemoglobin 7.7 (*) 12.0 - 15.0 (g/dL)   HCT 52.8 (*) 41.3 - 46.0 (%)   MCV 87.9  78.0 - 100.0 (fL)   MCH 30.0  26.0 - 34.0 (pg)   MCHC 34.1  30.0 - 36.0 (g/dL)   RDW 24.4  01.0 - 27.2 (%)   Platelets 254  150 - 400 (K/uL)  PREPARE RBC (CROSSMATCH)     Status: Normal   Collection Time   06/12/11  2:00 PM      Component Value Range   Order Confirmation ORDER PROCESSED BY BLOOD BANK      Studies/Results: No  results found.  Medications:    . albuterol  2.5 mg Nebulization TID  . aspirin  81 mg Oral Daily  . cholecalciferol  1,000 Units Oral QHS  . docusate sodium  100 mg Oral BID  . enoxaparin  40 mg Subcutaneous Q24H  . ezetimibe  10 mg Oral QPM  . furosemide  40 mg Intravenous Once  . ipratropium  0.5 mg Nebulization TID  . lisinopril  20 mg Oral Daily  . metoprolol succinate  25 mg Oral Q24H  . multivitamin-lutein  1 capsule Oral BID  . rosuvastatin  5 mg Oral 2 times weekly    acetaminophen, acetaminophen, albuterol, benzonatate, bisacodyl, guaiFENesin, ipratropium, menthol-cetylpyridinium, methocarbamol(ROBAXIN) IV, methocarbamol, metoCLOPramide (REGLAN) injection, metoCLOPramide, morphine, ondansetron (ZOFRAN) IV, ondansetron, oxyCODONE-acetaminophen, phenol, polyethylene glycol, traMADol     Assessment/Plan:  1. Right femoral neck fracture/Right humeral head fracture   S/P right hip hemiarthroplasty POD#6, orthopedics following Right humeral  fracture being treated conservatively.continue pain control, sling and PT  2. Anemia:  Due to postop acute blood loss and hemodilution S/P transfusion of 2 units PRBCs 1/14, Hb hemoglobin has trended down a  little bit since yesterday,  will transfuse 1 unit of PRBC today Also check CBC in am No overt bleeding otherwise  3. HX of CAD S/P metal stent:  plavix  on hold as per cardiology recommendations ,continue  ASA 81 mg.continue BB,statin ,ACEI  4. Hyponatremia: chronic, improved ,likely SIADH, stoped IVF, asymptomatic   5. Fever: secondary to atelectasis resolved,  continue use of incentive spirometer every hour  6. HTN : controlled ,continue meds   7.DVT prophylaxis : On Lovenox and SCD  Disposition: CSW working on placement  along with family SNF on Monday if no further issues    LOS: 7 days   Cathleen Yagi 06/12/2011, 2:40 PM

## 2011-06-13 DIAGNOSIS — S72001A Fracture of unspecified part of neck of right femur, initial encounter for closed fracture: Secondary | ICD-10-CM | POA: Diagnosis present

## 2011-06-13 DIAGNOSIS — S42291A Other displaced fracture of upper end of right humerus, initial encounter for closed fracture: Secondary | ICD-10-CM | POA: Diagnosis present

## 2011-06-13 DIAGNOSIS — I10 Essential (primary) hypertension: Secondary | ICD-10-CM | POA: Diagnosis present

## 2011-06-13 HISTORY — DX: Other displaced fracture of upper end of right humerus, initial encounter for closed fracture: S42.291A

## 2011-06-13 HISTORY — DX: Fracture of unspecified part of neck of right femur, initial encounter for closed fracture: S72.001A

## 2011-06-13 LAB — URINE CULTURE

## 2011-06-13 LAB — CBC
Hemoglobin: 8.4 g/dL — ABNORMAL LOW (ref 12.0–15.0)
MCH: 30 pg (ref 26.0–34.0)
MCHC: 34.3 g/dL (ref 30.0–36.0)
Platelets: 294 10*3/uL (ref 150–400)
RBC: 2.8 MIL/uL — ABNORMAL LOW (ref 3.87–5.11)

## 2011-06-13 MED ORDER — FUROSEMIDE 40 MG PO TABS
60.0000 mg | ORAL_TABLET | Freq: Once | ORAL | Status: AC
  Administered 2011-06-13: 60 mg via ORAL
  Filled 2011-06-13: qty 1

## 2011-06-13 MED ORDER — BENZONATATE 100 MG PO CAPS
200.0000 mg | ORAL_CAPSULE | Freq: Three times a day (TID) | ORAL | Status: DC | PRN
  Administered 2011-06-13: 200 mg via ORAL
  Filled 2011-06-13 (×2): qty 2

## 2011-06-13 MED ORDER — FUROSEMIDE 10 MG/ML IJ SOLN
40.0000 mg | Freq: Once | INTRAMUSCULAR | Status: DC
  Filled 2011-06-13: qty 4

## 2011-06-13 NOTE — Progress Notes (Signed)
Subjective: Patient feeling better. No complaints. Objective: Vital signs in last 24 hours: Temp:  [98.6 F (37 C)-99.4 F (37.4 C)] 98.6 F (37 C) (01/20 1404) Pulse Rate:  [79-95] 83  (01/20 1404) Resp:  [18-19] 18  (01/20 1404) BP: (139-153)/(67-82) 139/67 mmHg (01/20 1404) SpO2:  [91 %-94 %] 94 % (01/20 1404) Weight change:  Last BM Date: 06/13/11  Intake/Output from previous day: 01/19 0701 - 01/20 0700 In: 850 [I.V.:500; Blood:350] Out: 2180 [Urine:2180] Total I/O In: -  Out: 400 [Urine:400]   Physical Exam: General: Alert, awake, oriented x3, in no acute distress. HEENT: No bruits, no goiter. Heart: Regular rate and rhythm, without murmurs, rubs, gallops. Lungs: CTAB Abdomen: Soft, nontender, nondistended, positive bowel sounds. Extremities: Right lower extremity with 1-2+ edema, Right arm in sling  Neuro: Movement on the right side limited due to recent fracture  Lab Results: Results for orders placed during the hospital encounter of 06/05/11 (from the past 24 hour(s))  TYPE AND SCREEN     Status: Normal (Preliminary result)   Collection Time   06/12/11  4:15 PM      Component Value Range   ABO/RH(D) O POS     Antibody Screen NEG     Sample Expiration 06/15/2011     Unit Number [id number]     Blood Component Type RED CELLS,LR     Unit division 00     Status of Unit ISSUED,FINAL     Transfusion Status OK TO TRANSFUSE     Crossmatch Result Compatible     Unit Number [id number]     Blood Component Type RED CELLS,LR     Unit division 00     Status of Unit ALLOCATED     Transfusion Status OK TO TRANSFUSE     Crossmatch Result Compatible    CBC     Status: Abnormal   Collection Time   06/13/11  5:45 AM      Component Value Range   WBC 9.4  4.0 - 10.5 (K/uL)   RBC 2.80 (*) 3.87 - 5.11 (MIL/uL)   Hemoglobin 8.4 (*) 12.0 - 15.0 (g/dL)   HCT 08.6 (*) 57.8 - 46.0 (%)   MCV 87.5  78.0 - 100.0 (fL)   MCH 30.0  26.0 - 34.0 (pg)   MCHC 34.3  30.0 - 36.0 (g/dL)   RDW 46.9  62.9 - 52.8 (%)   Platelets 294  150 - 400 (K/uL)    Studies/Results: No results found.  Medications:    . albuterol  2.5 mg Nebulization TID  . aspirin  81 mg Oral Daily  . cholecalciferol  1,000 Units Oral QHS  . docusate sodium  100 mg Oral BID  . enoxaparin  40 mg Subcutaneous Q24H  . ezetimibe  10 mg Oral QPM  . furosemide  40 mg Intravenous Once  . ipratropium  0.5 mg Nebulization TID  . lisinopril  20 mg Oral Daily  . metoprolol succinate  25 mg Oral Q24H  . multivitamin-lutein  1 capsule Oral BID  . rosuvastatin  5 mg Oral 2 times weekly    acetaminophen, acetaminophen, albuterol, benzonatate, bisacodyl, guaiFENesin, ipratropium, menthol-cetylpyridinium, methocarbamol(ROBAXIN) IV, methocarbamol, metoCLOPramide (REGLAN) injection, metoCLOPramide, morphine, ondansetron (ZOFRAN) IV, ondansetron, oxyCODONE-acetaminophen, phenol, polyethylene glycol, traMADol     Assessment/Plan:  1. Right femoral neck fracture/Right humeral head fracture   S/P right hip hemiarthroplasty POD#7, orthopedics following Right humeral  fracture being treated conservatively.continue pain control, sling and PT  2. Anemia:  Due to postop  acute blood loss and hemodilution S/P transfusion of 2 units PRBCs 1/14, and 1 unit 06/12/11 No overt bleeding otherwise. Follow h/h.  3. HX of CAD S/P metal stent:  plavix  on hold as per cardiology recommendations ,continue  ASA 81 mg.continue BB,statin ,ACEI  4. Hyponatremia: chronic, improved ,likely SIADH, stoped IVF, asymptomatic   5. Fever: secondary to atelectasis resolved,  continue use of incentive spirometer every hour  6. HTN : controlled ,continue meds   7.DVT prophylaxis : On Lovenox and SCD  Disposition: CSW working on placement  along with family   LOS: 8 days   [name] 06/13/2011, 3:20 PM

## 2011-06-13 NOTE — Progress Notes (Signed)
Notification sent to [facility] in reference to patient not receiving order unit of blood. Transfusion was started but not  Complete due to IV infiltration. Eight attempts were made by four different nurses with no success.

## 2011-06-13 NOTE — Progress Notes (Signed)
Patient ID: [name], female   DOB: 05-Sep-1928, 78 y.o.   MRN: [id number] hgb8.4 today.  Plan--SNF at [facility].

## 2011-06-14 DIAGNOSIS — E7800 Pure hypercholesterolemia, unspecified: Secondary | ICD-10-CM | POA: Diagnosis not present

## 2011-06-14 DIAGNOSIS — S72009A Fracture of unspecified part of neck of unspecified femur, initial encounter for closed fracture: Secondary | ICD-10-CM | POA: Diagnosis not present

## 2011-06-14 DIAGNOSIS — D649 Anemia, unspecified: Secondary | ICD-10-CM | POA: Diagnosis not present

## 2011-06-14 DIAGNOSIS — S72009D Fracture of unspecified part of neck of unspecified femur, subsequent encounter for closed fracture with routine healing: Secondary | ICD-10-CM | POA: Diagnosis not present

## 2011-06-14 DIAGNOSIS — R21 Rash and other nonspecific skin eruption: Secondary | ICD-10-CM | POA: Diagnosis not present

## 2011-06-14 DIAGNOSIS — K5900 Constipation, unspecified: Secondary | ICD-10-CM | POA: Diagnosis not present

## 2011-06-14 DIAGNOSIS — S42293A Other displaced fracture of upper end of unspecified humerus, initial encounter for closed fracture: Secondary | ICD-10-CM | POA: Diagnosis not present

## 2011-06-14 DIAGNOSIS — D62 Acute posthemorrhagic anemia: Secondary | ICD-10-CM | POA: Diagnosis not present

## 2011-06-14 DIAGNOSIS — E782 Mixed hyperlipidemia: Secondary | ICD-10-CM | POA: Diagnosis not present

## 2011-06-14 DIAGNOSIS — I10 Essential (primary) hypertension: Secondary | ICD-10-CM | POA: Diagnosis not present

## 2011-06-14 DIAGNOSIS — I252 Old myocardial infarction: Secondary | ICD-10-CM | POA: Diagnosis not present

## 2011-06-14 DIAGNOSIS — Z5189 Encounter for other specified aftercare: Secondary | ICD-10-CM | POA: Diagnosis not present

## 2011-06-14 DIAGNOSIS — S42213A Unspecified displaced fracture of surgical neck of unspecified humerus, initial encounter for closed fracture: Secondary | ICD-10-CM | POA: Diagnosis not present

## 2011-06-14 DIAGNOSIS — I2510 Atherosclerotic heart disease of native coronary artery without angina pectoris: Secondary | ICD-10-CM | POA: Diagnosis not present

## 2011-06-14 DIAGNOSIS — S42209A Unspecified fracture of upper end of unspecified humerus, initial encounter for closed fracture: Secondary | ICD-10-CM | POA: Diagnosis not present

## 2011-06-14 DIAGNOSIS — Z9181 History of falling: Secondary | ICD-10-CM | POA: Diagnosis not present

## 2011-06-14 DIAGNOSIS — S79919A Unspecified injury of unspecified hip, initial encounter: Secondary | ICD-10-CM | POA: Diagnosis not present

## 2011-06-14 DIAGNOSIS — S72019A Unspecified intracapsular fracture of unspecified femur, initial encounter for closed fracture: Secondary | ICD-10-CM | POA: Diagnosis not present

## 2011-06-14 LAB — CBC
HCT: 24.8 % — ABNORMAL LOW (ref 36.0–46.0)
Hemoglobin: 8.4 g/dL — ABNORMAL LOW (ref 12.0–15.0)
MCV: 88.9 fL (ref 78.0–100.0)
WBC: 8.9 10*3/uL (ref 4.0–10.5)

## 2011-06-14 LAB — BASIC METABOLIC PANEL
BUN: 11 mg/dL (ref 6–23)
CO2: 30 mEq/L (ref 19–32)
Chloride: 92 mEq/L — ABNORMAL LOW (ref 96–112)
Creatinine, Ser: 0.53 mg/dL (ref 0.50–1.10)
Glucose, Bld: 104 mg/dL — ABNORMAL HIGH (ref 70–99)
Potassium: 3.3 mEq/L — ABNORMAL LOW (ref 3.5–5.1)

## 2011-06-14 MED ORDER — TRAMADOL HCL 50 MG PO TABS: 50.0000 mg | ORAL_TABLET | Freq: Four times a day (QID) | ORAL | Status: AC | PRN

## 2011-06-14 MED ORDER — BENZONATATE 100 MG PO CAPS: 200.0000 mg | ORAL_CAPSULE | Freq: Three times a day (TID) | ORAL | Status: AC | PRN

## 2011-06-14 MED ORDER — ENOXAPARIN SODIUM 30 MG/0.3ML ~~LOC~~ SOLN: 40.0000 mg | SUBCUTANEOUS | Status: AC

## 2011-06-14 MED ORDER — POTASSIUM CHLORIDE CRYS ER 20 MEQ PO TBCR
40.0000 meq | EXTENDED_RELEASE_TABLET | ORAL | Status: AC
  Administered 2011-06-14 (×2): 40 meq via ORAL
  Filled 2011-06-14 (×2): qty 2

## 2011-06-14 NOTE — Discharge Summary (Addendum)
Discharge Summary  [name] MR#: [id number]  DOB:[birth year]  Date of Admission: 06/05/2011 Date of Discharge: 06/14/2011  Patient's PCP: Pearla Dubonnet, MD, MD  Attending Physician:THOMPSON,DANIEL  Consults:  #1 orthopedics: Francena Hanly, M.D./ Loanne Drilling, MD  Discharge Diagnoses: Fracture of femoral neck, right status post right hip cemented bipolar hemiarthroplasty 06/06/2011 Present on Admission:  .Fracture of femoral neck, right .Fracture of humeral head, right, closed .HTN (hypertension) . Postoperative Anemia   Brief Admitting History and Physical HPI:  78 year old female who was brought to the hospital after patient tripped in the kitchen this morning hitting her right shoulder as well as right hip. X-ray shoulder did not show any fracture but x-ray of the hip shows subcapital femoral neck fracture. Patient does have a history of MI and had a bare-metal stent placed in 2008. Patient is currently on aspirin and Plavix. She denies any chest pain does not have dyspnea on exertion patient is able to climb one flight of stairs without any complaints. Patient has good function capacity. She denies any fever no nausea vomiting or diarrhea she did not pass out did not have blurred vision no seizures. For the rest of the admission history and physical please see H&P dictated by Dr. Sharl Ma.     Discharge Medications Medication List  As of 06/14/2011 12:51 PM   START taking these medications         albuterol 108 (90 BASE) MCG/ACT inhaler   Commonly known as: PROVENTIL HFA;VENTOLIN HFA   Inhale 2 puffs into the lungs every 4 (four) hours as needed for wheezing.      benzonatate 100 MG capsule   Commonly known as: TESSALON   Take 2 capsules (200 mg total) by mouth 3 (three) times daily as needed for cough.      DSS 100 MG Caps   Take 100 mg by mouth 2 (two) times daily as needed for constipation.      * enoxaparin 40 MG/0.4ML Soln   Commonly known as: LOVENOX   Inject 0.4 mLs (40 mg total) into the skin daily.      * enoxaparin 30 MG/0.3ML Soln   Commonly known as: LOVENOX   Inject 0.4 mLs (40 mg total) into the skin daily. Take for 2 weeks then stop.      polyethylene glycol packet   Commonly known as: MIRALAX / GLYCOLAX   Take 17 g by mouth daily as needed.      traMADol 50 MG tablet   Commonly known as: ULTRAM   Take 1 tablet (50 mg total) by mouth every 6 (six) hours as needed for pain.     * Notice: This list has 2 medication(s) that are the same as other medications prescribed for you. Read the directions carefully, and ask your doctor or other care provider to review them with you.       CONTINUE taking these medications         aspirin 81 MG tablet      cholecalciferol 1000 UNITS tablet   Commonly known as: VITAMIN D      CoQ10 200 MG Caps      ezetimibe 10 MG tablet   Commonly known as: ZETIA      lisinopril 20 MG tablet   Commonly known as: PRINIVIL,ZESTRIL      metoprolol succinate 25 MG 24 hr tablet   Commonly known as: TOPROL-XL      PreserVision/Lutein Caps      rosuvastatin 10 MG tablet  Commonly known as: CRESTOR         STOP taking these medications         clopidogrel 75 MG tablet          Where to get your medications    These are the prescriptions that you need to pick up. We sent them to a specific pharmacy, so you will need to go there to get them.   [facility] - [place], [place] - [address]    [address] [place] [address]    Phone: [phone]        polyethylene glycol packet         You may get these medications from any pharmacy.         benzonatate 100 MG capsule   enoxaparin 30 MG/0.3ML Soln   traMADol 50 MG tablet         Information on where to get these meds is not yet available. Ask your nurse or doctor.         albuterol 108 (90 BASE) MCG/ACT inhaler   DSS 100 MG Caps   enoxaparin 40 MG/0.4ML [employer]  Course: #1 Fracture of femoral neck, right/fracture of right humeral head closed status post right hip cemented bipolar hemiarthroplasty 06/06/2011 [name] was admitted to the hospitalist service on 06/05/2011 with both a fracture of the right femoral neck and fracture of the right humeral head after a mechanical fall. An orthopedic consultation was obtained from patient was initially seen in consultation by Dr. Caryn Bee supple on 06/05/2011. Orthopedics consulted and it was decided that patient needed surgical repair on the right femoral neck and the sling was placed on the right humeral head and that was treated conservatively. Patient had a right hip cemented bipolar hemiarthroplasty done per Dr. Lequita Halt on 06/06/2011. Prior to surgery patient's case was discussed with cardiology on call as patient had a history of coronary artery disease with bare-metal stent placements, it was decided to discontinue patient's aspirin and Plavix prior to surgery and resume only aspirin post surgery. Patient tolerated surgery well and remained in stable condition. Patient was seen by physical therapy and was followed during the hospitalization. Patient was maintained on Lovenox for DVT prophylaxis and will be discharged to a skilled nursing facility on Lovenox 40 mg subcutaneous daily for the next 2 weeks for DVT prophylaxis after which she will be maintained on her aspirin daily. Patient improved clinically she'll be discharged in stable and improved condition to a skilled nursing facility. She will follow up with Dr. Lequita Halt of orthopedics in 2 weeks. On follow up with orthopedics it will need to be decided when to resume her plavix, which will need to be discussed with her cardiologist, Dr Eldridge Dace.  #2 postoperative anemia During the hospitalization postoperatively patient was noted to be anemic, which was agitated 2 postop anemia. Patient did not have any overt GI bleed during this hospitalization. Patient  subsequently underwent a total of 3 units of packed red blood cells transfusion. Patient is hemoglobin remained stable. Patient be discharged in stable and improved condition. #3 hyponatremia During the hospitalization patient was noted to be hyponatremic it was felt this was likely chronic in nature. It was felt that patient may have a component of SIADH contributing to her hyponatremia. TSH and cortisol levels which were checked were within normal limits. Patient was hydrated gently with IV fluids with some  improvement in her hyponatremia. Patient remained asymptomatic throughout the hospitalization. Patient's sodium on day of discharge was 129. Patient will need to followup as outpatient. The rest of patient's chronic medical issues remained stable throughout the hospitalization. It was a pleasure taking care of [name].  Present on Admission:  .Fracture of femoral neck, right .Fracture of humeral head, right, closed .HTN (hypertension)   Day of Discharge BP 167/80  Pulse 80  Temp(Src) 98.4 F (36.9 C) (Oral)  Resp 18  Ht 5\' 3"  (1.6 m)  Wt 92.9 kg (204 lb 12.9 oz)  BMI 36.28 kg/m2  SpO2 94% General: Alert, awake, oriented x3, in no acute distress. HEENT: No bruits, no goiter. Heart: Regular rate and rhythm, without murmurs, rubs, gallops. Lungs: Clear to auscultation bilaterally. Abdomen: Soft, nontender, nondistended, positive bowel sounds. Extremities: No clubbing cyanosis. TRACE -1 + RLE edema with positive pedal pulses. Neuro: Grossly intact, nonfocal.    Results for orders placed during the hospital encounter of 06/05/11 (from the past 48 hour(s))  PREPARE RBC (CROSSMATCH)     Status: Normal   Collection Time   06/12/11  2:00 PM      Component Value Range Comment   Order Confirmation ORDER PROCESSED BY BLOOD BANK     TYPE AND SCREEN     Status: Normal (Preliminary result)   Collection Time   06/12/11  4:15 PM      Component Value Range Comment   ABO/RH(D) O POS       Antibody Screen NEG      Sample Expiration 06/15/2011      Unit Number [id number]      Blood Component Type RED CELLS,LR      Unit division 00      Status of Unit ISSUED,FINAL      Transfusion Status OK TO TRANSFUSE      Crossmatch Result Compatible      Unit Number [id number]      Blood Component Type RED CELLS,LR      Unit division 00      Status of Unit ALLOCATED      Transfusion Status OK TO TRANSFUSE      Crossmatch Result Compatible     CBC     Status: Abnormal   Collection Time   06/13/11  5:45 AM      Component Value Range Comment   WBC 9.4  4.0 - 10.5 (K/uL)    RBC 2.80 (*) 3.87 - 5.11 (MIL/uL)    Hemoglobin 8.4 (*) 12.0 - 15.0 (g/dL)    HCT 78.2 (*) 95.6 - 46.0 (%)    MCV 87.5  78.0 - 100.0 (fL)    MCH 30.0  26.0 - 34.0 (pg)    MCHC 34.3  30.0 - 36.0 (g/dL)    RDW 21.3  08.6 - 57.8 (%)    Platelets 294  150 - 400 (K/uL)   URINE CULTURE     Status: Normal (Preliminary result)   Collection Time   06/13/11  8:14 AM      Component Value Range Comment   Specimen Description URINE, RANDOM      Special Requests Normal      Setup Time [id number]      Colony Count >=100,000 COLONIES/ML      Culture ESCHERICHIA COLI      Report Status PENDING     CBC     Status: Abnormal   Collection Time   06/14/11  5:15 AM      Component Value  Range Comment   WBC 8.9  4.0 - 10.5 (K/uL)    RBC 2.79 (*) 3.87 - 5.11 (MIL/uL)    Hemoglobin 8.4 (*) 12.0 - 15.0 (g/dL)    HCT 11.9 (*) 14.7 - 46.0 (%)    MCV 88.9  78.0 - 100.0 (fL)    MCH 30.1  26.0 - 34.0 (pg)    MCHC 33.9  30.0 - 36.0 (g/dL)    RDW 82.9  56.2 - 13.0 (%)    Platelets 305  150 - 400 (K/uL)   BASIC METABOLIC PANEL     Status: Abnormal   Collection Time   06/14/11  5:15 AM      Component Value Range Comment   Sodium 129 (*) 135 - 145 (mEq/L)    Potassium 3.3 (*) 3.5 - 5.1 (mEq/L)    Chloride 92 (*) 96 - 112 (mEq/L)    CO2 30  19 - 32 (mEq/L)    Glucose, Bld 104 (*) 70 - 99 (mg/dL)    BUN 11  6 - 23 (mg/dL)     Creatinine, Ser 8.65  0.50 - 1.10 (mg/dL)    Calcium 8.2 (*) 8.4 - 10.5 (mg/dL)    GFR calc non Af Amer 87 (*) >90 (mL/min)    GFR calc Af Amer >90  >90 (mL/min)     Dg Chest 1 View  06/10/2011  *RADIOLOGY REPORT*  Clinical Data: Weakness, fever, cough  CHEST - 1 VIEW  Comparison: June 05, 2011  Findings: Mild cardiomegaly is unchanged.  There is pulmonary vascular cephalization without frank edema.  There is blunting of the left costophrenic angle which suggests a small effusion.  No focal infiltrates are identified.  IMPRESSION: Cardiomegaly and pulmonary vascular cephalization with small left pleural effusion.  Original Report Authenticated By: Brandon Melnick, M.D.   Dg Chest 1 View  06/05/2011  *RADIOLOGY REPORT*  Clinical Data: Status post fall on the right side of the chest today.  CHEST - 1 VIEW  Comparison: PA and lateral chest 01/30/2007.  Findings: There is some coarsening of the pulmonary interstitium, unchanged.  No focal airspace disease or effusion.  Heart size upper normal.  No acute bony abnormality.  Postoperative change left shoulder noted.  IMPRESSION: No acute finding.  Original Report Authenticated By: Bernadene Bell. Maricela Curet, M.D.   Dg Shoulder Right  06/05/2011  *RADIOLOGY REPORT*  Clinical Data: Fall on the right side.  RIGHT SHOULDER - 2+ VIEW  Comparison: Chest x-ray 01/30/2007  Findings: Degenerative changes are seen in the shoulder.  There is no evidence for acute fracture or subluxation.  The right lung apex is unremarkable in appearance.  Bones appear demineralized.  IMPRESSION:  1.  Degenerate changes. 2. No evidence for acute  abnormality.  Original Report Authenticated By: Patterson Hammersmith, M.D.   Dg Forearm Right  06/07/2011  *RADIOLOGY REPORT*  Clinical Data: Pain, injured following  RIGHT FOREARM - 2 VIEW  Comparison: None  Findings: Diffuse osseous demineralization. Volar plate and multiple screws identified at distal radius post ORIF. Small elbow joint effusion.  No acute fracture, dislocation or bone destruction is seen.  IMPRESSION: Osseous demineralization. Post ORIF of distal right radial fracture. No definite acute bony abnormalities identified, though a small elbow joint effusion is present.  Original Report Authenticated By: Lollie Marrow, M.D.   Dg Hip Complete Right  06/05/2011  *RADIOLOGY REPORT*  Clinical Data: Fall.  Injury of the right hip.  RIGHT HIP - COMPLETE 2+ VIEW  Comparison: None.  Findings: Views of the right hip include an AP view of the pelvis. There is an acute subcapital femoral neck fracture, associated with varus angulation and impaction at the fracture site.  There is significant lucency at the fracture site, raising the question of pathologic fracture.  Is there history of malignancy?  Bones generally appear radiolucent.  There is degenerative changes lower lumbar spine.  IMPRESSION: Subcapital femoral neck fracture.  Consider possibility of pathologic fracture.  Original Report Authenticated By: Patterson Hammersmith, M.D.   Dg Humerus Right  06/07/2011  *RADIOLOGY REPORT*  Clinical Data: Severe arm pain, injured trying to catch herself falling 2 days ago  RIGHT HUMERUS - 2+ VIEW  Comparison: Right shoulder radiographs 06/05/2011  Findings: Osseous mineralization diffusely diminished. AC joint alignment normal. Small displaced fragment identified at surgical neck of right humerus consistent with fracture. No dislocation or additional fracture identified.  IMPRESSION: Nondisplaced fracture at surgical neck of right humerus.  Original Report Authenticated By: Lollie Marrow, M.D.     Disposition: Skilled nursing facility  Diet: Low sodium diet  Activity: Nonweightbearing on the right upper extremity Weight-bearing as tolerated on right lower extremity   Follow-up Appts: Discharge Orders    Future Appointments: Provider: Department: Dept Phone: Center:   06/29/2011 8:15 AM [facility] [phone]  None   06/30/2011 8:15 AM [facility] [phone] None   07/01/2011 8:15 AM [facility] [phone] None   07/06/2011 8:15 AM [facility] [phone] None   07/07/2011 8:15 AM [facility] [phone] None   07/08/2011 8:15 AM [facility] [phone] None   07/13/2011 8:15 AM [facility] [phone] None   07/14/2011 8:15 AM [facility] [phone] None   07/15/2011 8:15 AM [facility] [phone] None   07/20/2011 8:15 AM [facility] [phone] None   07/21/2011 8:15 AM [facility] [phone] None   07/22/2011 8:15 AM [facility] [phone] None   07/27/2011 8:15 AM [facility] [phone] None   07/28/2011 8:15 AM [facility] [phone] None   07/29/2011 8:15 AM [facility] [phone] None   08/03/2011 8:15 AM [facility] [phone] None   08/04/2011 8:15 AM [facility] [phone] None   08/05/2011 8:15 AM [facility] [phone] None   08/05/2011 10:30 AM Stephanie Acre [facility] [phone] None   08/05/2011 11:00 AM Lowella Dell, MD [facility] [phone] None   08/10/2011 8:15 AM [facility] [phone] None   08/11/2011 8:15 AM [facility] [phone] None   08/12/2011 8:15 AM [facility] [phone] None   08/17/2011 8:15 AM [facility] [phone] None   08/18/2011 8:15 AM  [facility] [phone] None   08/19/2011 8:15 AM [facility] [phone] None   08/24/2011 8:15 AM [facility] [phone] None   08/25/2011 8:15 AM [facility] [phone] None   08/26/2011 8:15 AM [facility] [phone] None   08/31/2011 8:15 AM [facility] [phone] None   09/01/2011 8:15 AM [facility] [phone] None   09/02/2011 8:15 AM [facility] [phone]  None   09/07/2011 8:15 AM [facility] [phone] None   09/08/2011 8:15 AM [facility] [phone] None   09/09/2011 8:15 AM [facility] [phone] None   09/14/2011 8:15 AM [facility] [phone] None   09/15/2011 8:15 AM [facility] [phone] None   09/16/2011 8:15 AM [facility] [phone] None   09/21/2011 8:15 AM [facility] [phone] None   09/22/2011 8:15 AM [facility] [phone] None   09/23/2011 8:15 AM [facility] [phone] None   09/28/2011 8:15 AM [facility] [phone] None   09/29/2011 8:15 AM [facility] [phone] None   09/30/2011 8:15 AM [facility] [phone] None   10/05/2011 8:15 AM [facility] [phone] None   10/06/2011 8:15 AM [facility] [phone] None   10/07/2011 8:15 AM [facility] [phone] None   10/12/2011 8:15 AM  [facility] [phone] None   10/13/2011 8:15 AM [facility] [phone] None   10/14/2011 8:15 AM [facility] [phone] None   10/19/2011 8:15 AM [facility] [phone] None   10/20/2011 8:15 AM [facility] [phone] None   10/21/2011 8:15 AM [facility] [phone] None   10/26/2011 8:15 AM [facility] [phone] None   10/27/2011 8:15 AM [facility] [phone] None   10/28/2011 8:15 AM [facility] [phone] None   11/02/2011 8:15 AM [facility] [phone] None   11/03/2011 8:15 AM [facility] [phone] None   11/04/2011 8:15 AM [facility] [phone] None   11/09/2011 8:15 AM [facility] [phone] None   11/10/2011 8:15 AM [facility] [phone] None   11/11/2011 8:15 AM [facility] [phone] None   11/16/2011 8:15 AM [facility] [phone] None   11/17/2011 8:15 AM [facility] [phone] None   11/18/2011 8:15 AM [facility] [phone] None   11/23/2011 8:15 AM [facility] [phone] None   11/24/2011 8:15 AM [facility] [phone] None   11/25/2011 8:15 AM [facility] [phone] None   11/30/2011 8:15 AM [facility] [phone] None   12/01/2011 8:15 AM [facility] [phone] None   12/02/2011 8:15 AM  [facility] [phone] None   12/07/2011 8:15 AM [facility] [phone] None   12/08/2011 8:15 AM [facility] [phone] None   12/09/2011 8:15 AM [facility] [phone] None   12/14/2011 8:15 AM [facility] [phone] None   12/15/2011 8:15 AM [facility] [phone] None   12/16/2011 8:15 AM [facility] [phone] None   12/21/2011 8:15 AM [facility] [phone] None   12/22/2011 8:15 AM [facility] [phone] None   12/23/2011 8:15 AM [facility] [phone] None   12/28/2011 8:15 AM [facility] [phone] None   12/29/2011 8:15 AM [facility] [phone] None   12/30/2011 8:15 AM [facility] [phone] None   01/04/2012 8:15 AM [facility] [phone] None   01/05/2012 8:15 AM [facility] [phone] None   01/06/2012 8:15 AM [facility] [phone] None   01/11/2012 8:15 AM [facility] [phone] None   01/12/2012 8:15 AM [facility] [phone] None   01/13/2012 8:15 AM [facility] [phone] None   01/18/2012 8:15 AM [facility] [phone] None   01/19/2012 8:15 AM [facility] [phone] None   01/20/2012 8:15 AM [facility] [phone] None   01/25/2012 8:15 AM [facility] [phone] None   01/26/2012 8:15 AM  [facility] [phone] None   01/27/2012 8:15 AM [facility] [phone] None   02/01/2012 8:15 AM [facility] [phone] None   02/02/2012 8:15 AM [facility] [phone] None   02/03/2012 8:15 AM [facility] [phone] None   02/08/2012 8:15 AM [facility] [phone] None   02/09/2012 8:15 AM [facility] [phone] None   02/10/2012 8:15 AM [facility] [phone] None   02/15/2012 8:15 AM [facility] [phone] None   02/16/2012 8:15 AM [facility] [phone] None   02/17/2012 8:15 AM [facility] [phone] None   02/22/2012 8:15 AM [facility] [phone] None   02/23/2012 8:15 AM [facility] [phone] None   02/24/2012 8:15 AM [facility] [phone] None   02/29/2012 8:15 AM [facility] [phone] None   03/01/2012 8:15 AM [facility] [phone] None   03/02/2012 8:15 AM [facility] [phone] None   03/07/2012 8:15 AM [facility] [phone] None   03/08/2012 8:15 AM [facility] [phone] None   03/09/2012 8:15 AM [facility] [phone] None   03/14/2012 8:15 AM [facility] [phone] None   03/15/2012 8:15 AM [facility] [phone] None   03/16/2012 8:15 AM [facility] [phone] None    03/21/2012 8:15 AM [facility] [phone] None   03/22/2012 8:15 AM [facility] [phone] None   03/23/2012 8:15 AM [facility] [phone] None   03/28/2012 8:15 AM [facility] [phone] None   03/29/2012 8:15 AM [facility] [phone] None   03/30/2012 8:15 AM [facility] [phone] None   04/04/2012 8:15 AM [facility] [phone] None   04/05/2012 8:15 AM [facility] [phone] None   04/06/2012 8:15 AM [facility] [phone] None   04/11/2012 8:15 AM [facility] [phone] None   04/12/2012 8:15 AM [facility] [phone] None   04/13/2012 8:15 AM [facility] [phone] None   04/18/2012 8:15 AM [facility] [phone] None   04/19/2012 8:15 AM [facility] [phone] None   04/20/2012 8:15 AM [facility] [phone] None   04/25/2012 8:15 AM [facility]  Maintenance [facility] [phone] None   04/26/2012 8:15 AM [facility] [phone] None   04/27/2012 8:15 AM [facility] [phone] None   05/02/2012 8:15 AM [facility] [phone] None   05/03/2012 8:15 AM [facility] [phone] None   05/04/2012 8:15 AM [facility] [phone] None   05/09/2012 8:15 AM [facility] [phone] None   05/10/2012 8:15 AM [facility] [phone] None   05/11/2012 8:15 AM [facility] [phone] None   05/16/2012 8:15 AM [facility] [phone] None   05/17/2012 8:15 AM [facility] [phone] None   05/18/2012 8:15 AM [facility] [phone] None   05/23/2012 8:15 AM [facility] [phone] None   05/24/2012 8:15 AM [facility] [phone] None   05/25/2012 8:15 AM [facility] [phone] None   05/30/2012 8:15 AM [facility] [phone] None   05/31/2012 8:15 AM [facility] [phone] None   06/01/2012 8:15 AM [facility] [phone] None   06/06/2012 8:15 AM [facility] [phone] None   06/07/2012 8:15 AM [facility] [phone] None   06/08/2012 8:15 AM [facility] [phone] None   06/13/2012 8:15 AM [facility] [phone] None   06/14/2012 8:15 AM [facility] [phone] None   06/15/2012 8:15 AM [facility] [phone] None   06/20/2012 8:15 AM [facility] [phone] None   06/21/2012 8:15 AM [facility] [phone] None   06/22/2012 8:15 AM [facility] [phone] None     Future Orders Please Complete By Expires   Diet - low sodium heart healthy      Increase activity slowly      Comments:   Weight bearing as tolerated on RLE, Keep RUE in sling and non weight bearing.PER SNF   Discharge instructions      Comments:   Follow up with Dr Lequita Halt in 2 weeks. Follow up  with Dr Eldridge Dace in 3 weeks.      TESTS THAT NEED FOLLOW-UP CBC, BMET in 1-2 weeks  Time spent on discharge, talking to the patient, and coordinating care: 60 mins.   SignedRamiro Harvest 06/14/2011, 12:51 PM

## 2011-06-14 NOTE — Progress Notes (Addendum)
Pt discharged to facility by EMS. Pt stable at time of transfer; pt and family verbalizes d/c instructions and follow-up appt. Explained into detail to pt spouse about medications and follow-up appts.

## 2011-06-14 NOTE — Progress Notes (Signed)
Subjective: 8 Days Post-Op Procedure(s) (LRB): ARTHROPLASTY BIPOLAR HIP (Right) Patient reports pain as mild.   Patient has no complaints. Overall feeling much better  Objective: Vital signs in last 24 hours: Temp:  [97.4 F (36.3 C)-98.6 F (37 C)] 97.4 F (36.3 C) (01/20 2150) Pulse Rate:  [83-86] 86  (01/20 2150) Resp:  [18] 18  (01/20 2150) BP: (139-153)/(67-83) 153/83 mmHg (01/20 2150) SpO2:  [92 %-100 %] 100 % (01/20 2150)  Intake/Output from previous day:  Intake/Output Summary (Last 24 hours) at 06/14/11 0657 Last data filed at 06/14/11 0000  Gross per 24 hour  Intake      0 ml  Output   1375 ml  Net  -1375 ml    Intake/Output this shift: Total I/O In: -  Out: 350 [Urine:350]  Labs:  [facility] 06/14/11 0515 06/13/11 0545 06/12/11 1207 06/12/11 0540 06/11/11 1620  HGB 8.4* 8.4* 7.7* 7.7* 8.3*    Basename 06/14/11 0515 06/13/11 0545  WBC 8.9 9.4  RBC 2.79* 2.80*  HCT 24.8* 24.5*  PLT 305 294    Basename 06/14/11 0515 06/12/11 0540  NA 129* --  K 3.3* --  CL 92* --  CO2 30 --  BUN 11 --  CREATININE 0.53 0.49*  GLUCOSE 104* --  CALCIUM 8.2* --   No results found for this basename: LABPT:2,INR:2 in the last 72 hours  Exam - Neurologically intact Neurovascular intact Dorsiflexion/Plantar flexion intact Incision: no drainage Dressing/Incision - clean, dry, no drainage Motor function intact - moving foot and toes well on exam.   Past Medical History  Diagnosis Date  . Hypertension   . Myocardial infarction     Assessment/Plan: 8 Days Post-Op Procedure(s) (LRB): ARTHROPLASTY BIPOLAR HIP (Right) Principal Problem:  *Fracture of femoral neck, right Active Problems:  Postop Acute blood loss anemia  Postop Hyponatremia  Postop Transfusion history  Fracture of humeral head, right, closed  HTN (hypertension)   Discharge to SNF DVT prophylaxis- Lovenox 40 mg SQ daily x 2 weeks then 81 mg ASA daily for 4 weeks F/U in office in 2  weeks    Saranda Legrande V 06/14/2011, 6:57 AM

## 2011-06-14 NOTE — Progress Notes (Signed)
Patient cleared for discharge. All needed information faxed to [facility]. [facility] accepting patient. Patient set up with [facility]. CSW met with patient and patients spouse and son at bedside. All agreeable to transfer. CSW answered all questions. No further social work needs.  Citlaly Camplin C. Nathaneal Sommers MSW, LCSW [phone]

## 2011-06-14 NOTE — Progress Notes (Signed)
Physical Therapy Treatment Patient Details Name: [name] MRN: [id number] DOB: February 03, 1929 Today's Date: 06/14/2011 9:55-10:30 TA, TE PT Assessment/Plan  PT - Assessment/Plan Comments on Treatment Session: Pt progressing slowly but steadily. Plans to DC to SNF today. REady for DC from PT standpoint. NWB RUE limits pt's ability to perform standing activities, noted RLE buckled during pivot transfer to chair. PT Plan: Discharge plan remains appropriate PT Frequency: Min 3X/week Follow Up Recommendations: Skilled nursing facility Equipment Recommended: Defer to next venue PT Goals  Acute Rehab PT Goals PT Goal Formulation: With patient Time For Goal Achievement: 7 days Pt will go Supine/Side to Sit: with mod assist PT Goal: Supine/Side to Sit - Progress: Progressing toward goal Pt will go Sit to Supine/Side: with mod assist PT Goal: Sit to Supine/Side - Progress: Progressing toward goal Pt will Transfer Bed to Chair/Chair to Bed: with mod assist PT Transfer Goal: Bed to Chair/Chair to Bed - Progress: Progressing toward goal Pt will Stand: with mod assist PT Goal: Stand - Progress: Progressing toward goal Pt will Perform Home Exercise Program: with supervision, verbal cues required/provided PT Goal: Perform Home Exercise Program - Progress: Progressing toward goal  PT Treatment Precautions/Restrictions  Precautions Precautions: Posterior Hip Precaution Booklet Issued: No Precaution Comments: educated patient verbal and demo regarding hip precautions Required Braces or Orthoses: Yes Other Brace/Splint: rue sling Restrictions Weight Bearing Restrictions: Yes RUE Weight Bearing: Non weight bearing RLE Weight Bearing: Weight bearing as tolerated Other Position/Activity Restrictions: R UE sling Mobility (including Balance) Bed Mobility Bed Mobility: Yes Supine to Sit: 1: +2 Total assist Supine to Sit Details (indicate cue type and reason): pt 35%, limited by pain and  non-use of RUE, assist to initiate movement, to support RLE, and to elevate trunk Sitting - Scoot to Edge of Bed: 3: Mod assist Sitting - Scoot to Edge of Bed Details (indicate cue type and reason): assist to advance hips, pt 60% Transfers Transfers: Yes Sit to Stand: 1: +2 Total assist Sit to Stand Details (indicate cue type and reason): pt 45%, assist to achieve upright position Stand to Sit: 1: +2 Total assist Stand to Sit Details: assist to control descent and for hand placement, pt 50%, RLE buckled Squat Pivot Transfers: 1: +2 Total assist Squat Pivot Transfer Details (indicate cue type and reason): pt 40%, assist for balance, safety, RLE buckled Ambulation/Gait Ambulation/Gait: No    Exercise  Total Joint Exercises Ankle Circles/Pumps: AROM;Both;10 reps Short Arc Quad: AROM;Right;10 reps Heel Slides: AAROM;Right;10 reps Hip ABduction/ADduction: AAROM;Right;10 reps End of Session PT - End of Session Equipment Utilized During Treatment: Gait belt Activity Tolerance: Patient limited by pain Patient left: in chair;with call bell in reach;with family/visitor present Nurse Communication: Mobility status for transfers General Behavior During Session: [facility] for tasks performed Cognition: [facility] for tasks performed  Tamala Ser 06/14/2011, 11:14 AM  Tamala Ser PT 06/14/2011  [phone]

## 2011-06-15 ENCOUNTER — Encounter (HOSPITAL_COMMUNITY): Payer: Self-pay

## 2011-06-16 ENCOUNTER — Encounter (HOSPITAL_COMMUNITY): Payer: Self-pay

## 2011-06-17 ENCOUNTER — Encounter (HOSPITAL_COMMUNITY): Payer: Self-pay

## 2011-06-17 DIAGNOSIS — S42213A Unspecified displaced fracture of surgical neck of unspecified humerus, initial encounter for closed fracture: Secondary | ICD-10-CM | POA: Diagnosis not present

## 2011-06-17 DIAGNOSIS — S72019A Unspecified intracapsular fracture of unspecified femur, initial encounter for closed fracture: Secondary | ICD-10-CM | POA: Diagnosis not present

## 2011-06-17 DIAGNOSIS — D62 Acute posthemorrhagic anemia: Secondary | ICD-10-CM | POA: Diagnosis not present

## 2011-06-17 DIAGNOSIS — I2510 Atherosclerotic heart disease of native coronary artery without angina pectoris: Secondary | ICD-10-CM | POA: Diagnosis not present

## 2011-06-22 ENCOUNTER — Encounter (HOSPITAL_COMMUNITY): Payer: Self-pay

## 2011-06-22 DIAGNOSIS — S42209A Unspecified fracture of upper end of unspecified humerus, initial encounter for closed fracture: Secondary | ICD-10-CM | POA: Diagnosis not present

## 2011-06-23 ENCOUNTER — Encounter (HOSPITAL_COMMUNITY): Payer: Self-pay

## 2011-06-24 ENCOUNTER — Encounter (HOSPITAL_COMMUNITY): Payer: Self-pay

## 2011-06-29 ENCOUNTER — Encounter (HOSPITAL_COMMUNITY): Payer: Self-pay

## 2011-06-30 ENCOUNTER — Encounter (HOSPITAL_COMMUNITY): Payer: Self-pay

## 2011-07-01 ENCOUNTER — Encounter (HOSPITAL_COMMUNITY): Payer: Self-pay

## 2011-07-06 ENCOUNTER — Encounter (HOSPITAL_COMMUNITY): Payer: Self-pay

## 2011-07-06 DIAGNOSIS — I2510 Atherosclerotic heart disease of native coronary artery without angina pectoris: Secondary | ICD-10-CM | POA: Diagnosis not present

## 2011-07-06 DIAGNOSIS — I252 Old myocardial infarction: Secondary | ICD-10-CM | POA: Diagnosis not present

## 2011-07-06 DIAGNOSIS — E782 Mixed hyperlipidemia: Secondary | ICD-10-CM | POA: Diagnosis not present

## 2011-07-06 DIAGNOSIS — D649 Anemia, unspecified: Secondary | ICD-10-CM | POA: Diagnosis not present

## 2011-07-07 ENCOUNTER — Encounter (HOSPITAL_COMMUNITY): Payer: Self-pay

## 2011-07-08 ENCOUNTER — Encounter (HOSPITAL_COMMUNITY): Payer: Self-pay

## 2011-07-13 ENCOUNTER — Encounter (HOSPITAL_COMMUNITY): Payer: Self-pay

## 2011-07-14 ENCOUNTER — Encounter (HOSPITAL_COMMUNITY): Payer: Self-pay

## 2011-07-14 DIAGNOSIS — R21 Rash and other nonspecific skin eruption: Secondary | ICD-10-CM | POA: Diagnosis not present

## 2011-07-14 DIAGNOSIS — K5900 Constipation, unspecified: Secondary | ICD-10-CM | POA: Diagnosis not present

## 2011-07-14 DIAGNOSIS — I10 Essential (primary) hypertension: Secondary | ICD-10-CM | POA: Diagnosis not present

## 2011-07-14 DIAGNOSIS — I2510 Atherosclerotic heart disease of native coronary artery without angina pectoris: Secondary | ICD-10-CM | POA: Diagnosis not present

## 2011-07-14 DIAGNOSIS — S42213A Unspecified displaced fracture of surgical neck of unspecified humerus, initial encounter for closed fracture: Secondary | ICD-10-CM | POA: Diagnosis not present

## 2011-07-14 DIAGNOSIS — E7800 Pure hypercholesterolemia, unspecified: Secondary | ICD-10-CM | POA: Diagnosis not present

## 2011-07-15 ENCOUNTER — Encounter (HOSPITAL_COMMUNITY): Payer: Self-pay

## 2011-07-20 ENCOUNTER — Encounter (HOSPITAL_COMMUNITY): Payer: Self-pay

## 2011-07-20 DIAGNOSIS — S42293A Other displaced fracture of upper end of unspecified humerus, initial encounter for closed fracture: Secondary | ICD-10-CM | POA: Diagnosis not present

## 2011-07-21 ENCOUNTER — Encounter (HOSPITAL_COMMUNITY): Payer: Self-pay

## 2011-07-21 DIAGNOSIS — I10 Essential (primary) hypertension: Secondary | ICD-10-CM | POA: Diagnosis not present

## 2011-07-21 DIAGNOSIS — I2510 Atherosclerotic heart disease of native coronary artery without angina pectoris: Secondary | ICD-10-CM | POA: Diagnosis not present

## 2011-07-21 DIAGNOSIS — Z5189 Encounter for other specified aftercare: Secondary | ICD-10-CM | POA: Diagnosis not present

## 2011-07-21 DIAGNOSIS — Z96649 Presence of unspecified artificial hip joint: Secondary | ICD-10-CM | POA: Diagnosis not present

## 2011-07-21 DIAGNOSIS — Z471 Aftercare following joint replacement surgery: Secondary | ICD-10-CM | POA: Diagnosis not present

## 2011-07-22 ENCOUNTER — Encounter (HOSPITAL_COMMUNITY): Payer: Self-pay

## 2011-07-22 DIAGNOSIS — Z5189 Encounter for other specified aftercare: Secondary | ICD-10-CM | POA: Diagnosis not present

## 2011-07-22 DIAGNOSIS — I2510 Atherosclerotic heart disease of native coronary artery without angina pectoris: Secondary | ICD-10-CM | POA: Diagnosis not present

## 2011-07-22 DIAGNOSIS — I10 Essential (primary) hypertension: Secondary | ICD-10-CM | POA: Diagnosis not present

## 2011-07-22 DIAGNOSIS — Z96649 Presence of unspecified artificial hip joint: Secondary | ICD-10-CM | POA: Diagnosis not present

## 2011-07-22 DIAGNOSIS — Z471 Aftercare following joint replacement surgery: Secondary | ICD-10-CM | POA: Diagnosis not present

## 2011-07-23 DIAGNOSIS — Z96649 Presence of unspecified artificial hip joint: Secondary | ICD-10-CM | POA: Diagnosis not present

## 2011-07-23 DIAGNOSIS — Z5189 Encounter for other specified aftercare: Secondary | ICD-10-CM | POA: Diagnosis not present

## 2011-07-23 DIAGNOSIS — I2510 Atherosclerotic heart disease of native coronary artery without angina pectoris: Secondary | ICD-10-CM | POA: Diagnosis not present

## 2011-07-23 DIAGNOSIS — I10 Essential (primary) hypertension: Secondary | ICD-10-CM | POA: Diagnosis not present

## 2011-07-23 DIAGNOSIS — Z471 Aftercare following joint replacement surgery: Secondary | ICD-10-CM | POA: Diagnosis not present

## 2011-07-26 DIAGNOSIS — I2510 Atherosclerotic heart disease of native coronary artery without angina pectoris: Secondary | ICD-10-CM | POA: Diagnosis not present

## 2011-07-26 DIAGNOSIS — Z471 Aftercare following joint replacement surgery: Secondary | ICD-10-CM | POA: Diagnosis not present

## 2011-07-26 DIAGNOSIS — Z5189 Encounter for other specified aftercare: Secondary | ICD-10-CM | POA: Diagnosis not present

## 2011-07-26 DIAGNOSIS — Z96649 Presence of unspecified artificial hip joint: Secondary | ICD-10-CM | POA: Diagnosis not present

## 2011-07-26 DIAGNOSIS — I10 Essential (primary) hypertension: Secondary | ICD-10-CM | POA: Diagnosis not present

## 2011-07-27 ENCOUNTER — Encounter (HOSPITAL_COMMUNITY): Payer: Medicare Other

## 2011-07-27 DIAGNOSIS — Z471 Aftercare following joint replacement surgery: Secondary | ICD-10-CM | POA: Diagnosis not present

## 2011-07-27 DIAGNOSIS — Z5189 Encounter for other specified aftercare: Secondary | ICD-10-CM | POA: Diagnosis not present

## 2011-07-27 DIAGNOSIS — Z96649 Presence of unspecified artificial hip joint: Secondary | ICD-10-CM | POA: Diagnosis not present

## 2011-07-27 DIAGNOSIS — I2510 Atherosclerotic heart disease of native coronary artery without angina pectoris: Secondary | ICD-10-CM | POA: Diagnosis not present

## 2011-07-27 DIAGNOSIS — I10 Essential (primary) hypertension: Secondary | ICD-10-CM | POA: Diagnosis not present

## 2011-07-28 ENCOUNTER — Encounter (HOSPITAL_COMMUNITY): Payer: Medicare Other

## 2011-07-28 DIAGNOSIS — I10 Essential (primary) hypertension: Secondary | ICD-10-CM | POA: Diagnosis not present

## 2011-07-28 DIAGNOSIS — I2510 Atherosclerotic heart disease of native coronary artery without angina pectoris: Secondary | ICD-10-CM | POA: Diagnosis not present

## 2011-07-28 DIAGNOSIS — Z5189 Encounter for other specified aftercare: Secondary | ICD-10-CM | POA: Diagnosis not present

## 2011-07-28 DIAGNOSIS — Z96649 Presence of unspecified artificial hip joint: Secondary | ICD-10-CM | POA: Diagnosis not present

## 2011-07-28 DIAGNOSIS — Z471 Aftercare following joint replacement surgery: Secondary | ICD-10-CM | POA: Diagnosis not present

## 2011-07-29 ENCOUNTER — Encounter (HOSPITAL_COMMUNITY): Payer: Medicare Other

## 2011-07-29 DIAGNOSIS — I2510 Atherosclerotic heart disease of native coronary artery without angina pectoris: Secondary | ICD-10-CM | POA: Diagnosis not present

## 2011-07-29 DIAGNOSIS — Z5189 Encounter for other specified aftercare: Secondary | ICD-10-CM | POA: Diagnosis not present

## 2011-07-29 DIAGNOSIS — I10 Essential (primary) hypertension: Secondary | ICD-10-CM | POA: Diagnosis not present

## 2011-07-29 DIAGNOSIS — Z471 Aftercare following joint replacement surgery: Secondary | ICD-10-CM | POA: Diagnosis not present

## 2011-07-29 DIAGNOSIS — Z96649 Presence of unspecified artificial hip joint: Secondary | ICD-10-CM | POA: Diagnosis not present

## 2011-07-30 DIAGNOSIS — Z471 Aftercare following joint replacement surgery: Secondary | ICD-10-CM | POA: Diagnosis not present

## 2011-07-30 DIAGNOSIS — Z5189 Encounter for other specified aftercare: Secondary | ICD-10-CM | POA: Diagnosis not present

## 2011-07-30 DIAGNOSIS — I2510 Atherosclerotic heart disease of native coronary artery without angina pectoris: Secondary | ICD-10-CM | POA: Diagnosis not present

## 2011-07-30 DIAGNOSIS — I10 Essential (primary) hypertension: Secondary | ICD-10-CM | POA: Diagnosis not present

## 2011-07-30 DIAGNOSIS — Z96649 Presence of unspecified artificial hip joint: Secondary | ICD-10-CM | POA: Diagnosis not present

## 2011-08-02 DIAGNOSIS — I10 Essential (primary) hypertension: Secondary | ICD-10-CM | POA: Diagnosis not present

## 2011-08-02 DIAGNOSIS — I2510 Atherosclerotic heart disease of native coronary artery without angina pectoris: Secondary | ICD-10-CM | POA: Diagnosis not present

## 2011-08-02 DIAGNOSIS — Z96649 Presence of unspecified artificial hip joint: Secondary | ICD-10-CM | POA: Diagnosis not present

## 2011-08-02 DIAGNOSIS — Z5189 Encounter for other specified aftercare: Secondary | ICD-10-CM | POA: Diagnosis not present

## 2011-08-02 DIAGNOSIS — Z471 Aftercare following joint replacement surgery: Secondary | ICD-10-CM | POA: Diagnosis not present

## 2011-08-03 ENCOUNTER — Encounter (HOSPITAL_COMMUNITY): Payer: Medicare Other

## 2011-08-03 DIAGNOSIS — Z96649 Presence of unspecified artificial hip joint: Secondary | ICD-10-CM | POA: Diagnosis not present

## 2011-08-03 DIAGNOSIS — I10 Essential (primary) hypertension: Secondary | ICD-10-CM | POA: Diagnosis not present

## 2011-08-03 DIAGNOSIS — Z5189 Encounter for other specified aftercare: Secondary | ICD-10-CM | POA: Diagnosis not present

## 2011-08-03 DIAGNOSIS — I2510 Atherosclerotic heart disease of native coronary artery without angina pectoris: Secondary | ICD-10-CM | POA: Diagnosis not present

## 2011-08-03 DIAGNOSIS — Z471 Aftercare following joint replacement surgery: Secondary | ICD-10-CM | POA: Diagnosis not present

## 2011-08-04 ENCOUNTER — Encounter (HOSPITAL_COMMUNITY): Payer: Medicare Other

## 2011-08-04 DIAGNOSIS — I10 Essential (primary) hypertension: Secondary | ICD-10-CM | POA: Diagnosis not present

## 2011-08-04 DIAGNOSIS — Z471 Aftercare following joint replacement surgery: Secondary | ICD-10-CM | POA: Diagnosis not present

## 2011-08-04 DIAGNOSIS — I2510 Atherosclerotic heart disease of native coronary artery without angina pectoris: Secondary | ICD-10-CM | POA: Diagnosis not present

## 2011-08-04 DIAGNOSIS — Z96649 Presence of unspecified artificial hip joint: Secondary | ICD-10-CM | POA: Diagnosis not present

## 2011-08-04 DIAGNOSIS — Z5189 Encounter for other specified aftercare: Secondary | ICD-10-CM | POA: Diagnosis not present

## 2011-08-05 ENCOUNTER — Encounter (HOSPITAL_COMMUNITY): Payer: Medicare Other

## 2011-08-05 ENCOUNTER — Ambulatory Visit (HOSPITAL_BASED_OUTPATIENT_CLINIC_OR_DEPARTMENT_OTHER): Payer: Medicare Other | Admitting: Oncology

## 2011-08-05 ENCOUNTER — Other Ambulatory Visit: Payer: Medicare Other | Admitting: Lab

## 2011-08-05 VITALS — BP 155/77 | HR 70 | Temp 98.5°F | Ht 63.0 in | Wt 192.3 lb

## 2011-08-05 DIAGNOSIS — Z96649 Presence of unspecified artificial hip joint: Secondary | ICD-10-CM | POA: Diagnosis not present

## 2011-08-05 DIAGNOSIS — C50919 Malignant neoplasm of unspecified site of unspecified female breast: Secondary | ICD-10-CM

## 2011-08-05 DIAGNOSIS — I10 Essential (primary) hypertension: Secondary | ICD-10-CM | POA: Diagnosis not present

## 2011-08-05 DIAGNOSIS — D0590 Unspecified type of carcinoma in situ of unspecified breast: Secondary | ICD-10-CM

## 2011-08-05 DIAGNOSIS — Z471 Aftercare following joint replacement surgery: Secondary | ICD-10-CM | POA: Diagnosis not present

## 2011-08-05 DIAGNOSIS — I2510 Atherosclerotic heart disease of native coronary artery without angina pectoris: Secondary | ICD-10-CM | POA: Diagnosis not present

## 2011-08-05 DIAGNOSIS — Z5189 Encounter for other specified aftercare: Secondary | ICD-10-CM | POA: Diagnosis not present

## 2011-08-05 NOTE — Progress Notes (Signed)
ID: [name]   DOB: August 30, 1928  MR#: [id number]  JYN#:[id number]  HISTORY OF PRESENT ILLNESS: [name] had a screening mammogram at the [facility] July 25, which showed some new calcifications in the left breast.  On July 31, a diagnostic left mammogram found calcifications, but no associated masses or architectural distortion.  The biopsy was performed August 13 and the pathology ([id number] and [id number]) showed high-grade ductal carcinoma in situ, which was 86% ER positive 0% PR positive.  With this information the patient was referred to [name] and on August 26 had bilateral breast MRIs, the left breast showed an area measuring up to 3.5 cm of irregular enhancement.  There were several left axillary lymph nodes noted, some of which did not have a fatty hilum and the largest measured 7 mm.  The right breast was unremarkable.  A right breast ultrasound was performed August 27, which showed some normal appearing lymph nodes.  There was no abnormal lymph node noted, and certainly no mass or palpable lymph node was found. The patient proceeded to left lumpectomy with sentinel lymph node biopsy January 31, 2007 with the final pathology [id number]) showing an 8 mm area of ductal carcinoma in situ with the closest margin 1.2 cm.  No evidence of vascular invasion and the sentinel lymph node examined negative.  Her subsequent history is as detailed below  INTERVAL HISTORY: She returns today with her husband [name] for followup of her noninvasive breast cancer. Since her last visit here at [facility] slipped in her son's kitchen and fractured her right hip and right shoulder. She had a total hip replacement under [name] and is still very successfully going through rehabilitation.  REVIEW OF SYSTEMS: Of course she continues to have problems from her macular degeneration. She also had ulnar nerve damage in her left forearm from a blood transfusion axis. Her left being he is less  mobile, it is now, and it is beginning to develop a curvature. Otherwise a detailed review of systems was noncontributory  PAST MEDICAL HISTORY: Past Medical History  Diagnosis Date  . Hypertension   . Myocardial infarction   Significant for coronary artery disease, status post myocardial infarction February 2008 with a metal stent in place.  The patient is status post cataract surgery bilaterally.  She has a history of remote right wrist surgery with a titanium implant in place (June of 2006, under Josephine Igo); she also had left shoulder rotator cuff surgery April of 2007, again under Dr. Teressa Senter.  She has a tooth implant in place.  She is status post tonsillectomy and adenoidectomy.  PAST SURGICAL HISTORY: Past Surgical History  Procedure Date  . Coronary angioplasty with stent placement   . Shoulder surgery   . Breast lumpectomy   . Wrist surgery   . Hip arthroplasty 06/06/2011    Procedure: ARTHROPLASTY BIPOLAR HIP;  Surgeon: Loanne Drilling;  Location: [facility];  Service: Orthopedics;  Laterality: Right;    FAMILY HISTORY The patient's mother died shortly after diagnosis of a brain tumor at age 2.  The patient's father died from heart disease at the age of 74 as well.  The patient had one sister who died from a stroke at age 56.  GYNECOLOGIC HISTORY: The patient is GX, P3, first pregnancy age 72.  She went through the change of life in her early 77s and took Prempro between 9 and 2001.  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  She also worked as a Contractor previously.  Her husband [name] used to be president of [employer], but he has been retired 17 years.  Their three children are [name] 45 years old who has a Scientist, water quality in theology from [employer] and currently is a stay at home mom with two teenage sons, son [name].), CEO of the Hem/Onc practice  at the [facility] in [place] (associated with [facility]) and # 3 is [name] (goes by [name]), who is an Theme park manager in the [employer].  The patient has a total of eight grandchildren.  She attends [employer].   ADVANCED DIRECTIVES:  HEALTH MAINTENANCE: History  Substance Use Topics  . Smoking status: Never Smoker   . Smokeless tobacco: Not on file  . Alcohol Use: No      Allergies  Allergen Reactions  . Chlorpheniramine Anaphylaxis  . Codeine Anaphylaxis    Current Outpatient Prescriptions  Medication Sig Dispense Refill  . aspirin 81 MG tablet Take 81-160 mg by mouth daily. Depending on bruising      . cholecalciferol (VITAMIN D) 1000 UNITS tablet Take 1,000 Units by mouth at bedtime.      . Coenzyme Q10 (COQ10) 200 MG CAPS Take 1 tablet by mouth daily.      Marland Kitchen ezetimibe (ZETIA) 10 MG tablet Take 10 mg by mouth every evening.       Marland Kitchen lisinopril (PRINIVIL,ZESTRIL) 20 MG tablet Take 20 mg by mouth daily.      . metoprolol succinate (TOPROL-XL) 25 MG 24 hr tablet Take 25 mg by mouth every evening.       . Multiple Vitamins-Minerals (PRESERVISION/LUTEIN) CAPS Take 1 capsule by mouth 2 (two) times daily.      . rosuvastatin (CRESTOR) 10 MG tablet Take 5 mg by mouth 2 (two) times a week. Tuesday and thursday      . albuterol (PROVENTIL HFA;VENTOLIN HFA) 108 (90 BASE) MCG/ACT inhaler Inhale 2 puffs into the lungs every 4 (four) hours as needed for wheezing.        OBJECTIVE:  Elderly white woman who appears frail Filed Vitals:   08/05/11 1101  BP: 155/77  Pulse: 70  Temp: 98.5 F (36.9 C)     Body mass index is 34.06 kg/(m^2).    ECOG FS: 2  Sclerae unicteric Oropharynx clear No peripheral adenopathy Lungs no rales or rhonchi Heart regular rate and rhythm Abd benign MSK no focal spinal tenderness, no peripheral edema Neuro: nonfocal Breasts: Right breast no suspicious finding. Left breast status  post lumpectomy. No evidence of local recurrence  LAB RESULTS: Lab Results  Component Value Date   WBC 8.9 06/14/2011   NEUTROABS 4.2 01/30/2007   HGB 8.4* 06/14/2011   HCT 24.8* 06/14/2011   MCV 88.9 06/14/2011   PLT 305 06/14/2011      Chemistry      Component Value Date/Time   NA 129* 06/14/2011 0515   K 3.3* 06/14/2011 0515   CL 92* 06/14/2011 0515   CO2 30 06/14/2011 0515   BUN 11 06/14/2011 0515   CREATININE 0.53 06/14/2011 0515      Component Value Date/Time   CALCIUM 8.2* 06/14/2011 0515   ALKPHOS 75 06/06/2011 0602   AST 17 06/06/2011 0602   ALT 11 06/06/2011 0602   BILITOT 0.6 06/06/2011 0602  No results found for this basename: LABCA2    No components found with this basename: [id number]    No results found for this basename: INR:1;PROTIME:1 in the last 168 hours  Urinalysis    Component Value Date/Time   COLORURINE AMBER* 06/10/2011 1249   APPEARANCEUR CLOUDY* 06/10/2011 1249   LABSPEC 1.020 06/10/2011 1249   PHURINE 6.0 06/10/2011 1249   GLUCOSEU NEGATIVE 06/10/2011 1249   HGBUR LARGE* 06/10/2011 1249   BILIRUBINUR NEGATIVE 06/10/2011 1249   KETONESUR NEGATIVE 06/10/2011 1249   PROTEINUR 30* 06/10/2011 1249   UROBILINOGEN 1.0 06/10/2011 1249   NITRITE NEGATIVE 06/10/2011 1249   LEUKOCYTESUR SMALL* 06/10/2011 1249       STUDIES: No results found. Her next mammogram will be at [facility] in August  ASSESSMENT: 78 year old [place] woman status post left lumpectomy and sentinel lymph node biopsy September 2008 for an 8-mm area of ductal carcinoma in situ, high-grade, estrogen receptor positive, progesterone receptor negative, with a negative sentinel lymph node and ample margins, also status post left nipple exploration and central duct excision at [facility], the final pathology showing only focal atypical ductal hyperplasia.   PLAN: From a breast cancer point of view I am very comfortable releasing [name] out to her primary care doctors. She will continue to see Filiberto Pinks at [facility] and she will have her mammograms there as well. I did suggest she consider calling Rob Sypher about her left pinky, but in the meantime she can use a splint to try to keep the finger straight.  She knows I will be glad to see her on an as-needed basis in the future, but as of now no further appointments have been made for her here.   Miamor Ayler C    08/05/2011

## 2011-08-06 DIAGNOSIS — I10 Essential (primary) hypertension: Secondary | ICD-10-CM | POA: Diagnosis not present

## 2011-08-06 DIAGNOSIS — I2510 Atherosclerotic heart disease of native coronary artery without angina pectoris: Secondary | ICD-10-CM | POA: Diagnosis not present

## 2011-08-06 DIAGNOSIS — Z471 Aftercare following joint replacement surgery: Secondary | ICD-10-CM | POA: Diagnosis not present

## 2011-08-06 DIAGNOSIS — Z96649 Presence of unspecified artificial hip joint: Secondary | ICD-10-CM | POA: Diagnosis not present

## 2011-08-06 DIAGNOSIS — Z5189 Encounter for other specified aftercare: Secondary | ICD-10-CM | POA: Diagnosis not present

## 2011-08-09 DIAGNOSIS — Z5189 Encounter for other specified aftercare: Secondary | ICD-10-CM | POA: Diagnosis not present

## 2011-08-09 DIAGNOSIS — I2510 Atherosclerotic heart disease of native coronary artery without angina pectoris: Secondary | ICD-10-CM | POA: Diagnosis not present

## 2011-08-09 DIAGNOSIS — Z96649 Presence of unspecified artificial hip joint: Secondary | ICD-10-CM | POA: Diagnosis not present

## 2011-08-09 DIAGNOSIS — I10 Essential (primary) hypertension: Secondary | ICD-10-CM | POA: Diagnosis not present

## 2011-08-09 DIAGNOSIS — Z471 Aftercare following joint replacement surgery: Secondary | ICD-10-CM | POA: Diagnosis not present

## 2011-08-10 ENCOUNTER — Encounter (HOSPITAL_COMMUNITY): Payer: Medicare Other

## 2011-08-10 DIAGNOSIS — I10 Essential (primary) hypertension: Secondary | ICD-10-CM | POA: Diagnosis not present

## 2011-08-10 DIAGNOSIS — Z471 Aftercare following joint replacement surgery: Secondary | ICD-10-CM | POA: Diagnosis not present

## 2011-08-10 DIAGNOSIS — I2510 Atherosclerotic heart disease of native coronary artery without angina pectoris: Secondary | ICD-10-CM | POA: Diagnosis not present

## 2011-08-10 DIAGNOSIS — Z96649 Presence of unspecified artificial hip joint: Secondary | ICD-10-CM | POA: Diagnosis not present

## 2011-08-10 DIAGNOSIS — Z5189 Encounter for other specified aftercare: Secondary | ICD-10-CM | POA: Diagnosis not present

## 2011-08-11 ENCOUNTER — Encounter (HOSPITAL_COMMUNITY): Payer: Medicare Other

## 2011-08-12 ENCOUNTER — Encounter (HOSPITAL_COMMUNITY): Payer: Medicare Other

## 2011-08-13 DIAGNOSIS — I10 Essential (primary) hypertension: Secondary | ICD-10-CM | POA: Diagnosis not present

## 2011-08-13 DIAGNOSIS — Z471 Aftercare following joint replacement surgery: Secondary | ICD-10-CM | POA: Diagnosis not present

## 2011-08-13 DIAGNOSIS — Z5189 Encounter for other specified aftercare: Secondary | ICD-10-CM | POA: Diagnosis not present

## 2011-08-13 DIAGNOSIS — I2510 Atherosclerotic heart disease of native coronary artery without angina pectoris: Secondary | ICD-10-CM | POA: Diagnosis not present

## 2011-08-13 DIAGNOSIS — Z96649 Presence of unspecified artificial hip joint: Secondary | ICD-10-CM | POA: Diagnosis not present

## 2011-08-17 ENCOUNTER — Encounter (HOSPITAL_COMMUNITY): Payer: Medicare Other

## 2011-08-18 ENCOUNTER — Encounter (HOSPITAL_COMMUNITY): Payer: Medicare Other

## 2011-08-19 ENCOUNTER — Encounter (HOSPITAL_COMMUNITY): Payer: Medicare Other

## 2011-08-19 DIAGNOSIS — S42293A Other displaced fracture of upper end of unspecified humerus, initial encounter for closed fracture: Secondary | ICD-10-CM | POA: Diagnosis not present

## 2011-08-24 ENCOUNTER — Encounter (HOSPITAL_COMMUNITY): Payer: Medicare Other

## 2011-08-25 ENCOUNTER — Encounter (HOSPITAL_COMMUNITY): Payer: Medicare Other

## 2011-08-26 ENCOUNTER — Encounter (HOSPITAL_COMMUNITY): Payer: Medicare Other

## 2011-08-26 DIAGNOSIS — S72043A Displaced fracture of base of neck of unspecified femur, initial encounter for closed fracture: Secondary | ICD-10-CM | POA: Diagnosis not present

## 2011-08-30 DIAGNOSIS — S72043A Displaced fracture of base of neck of unspecified femur, initial encounter for closed fracture: Secondary | ICD-10-CM | POA: Diagnosis not present

## 2011-08-31 ENCOUNTER — Encounter (HOSPITAL_COMMUNITY): Payer: Medicare Other

## 2011-09-01 ENCOUNTER — Encounter (HOSPITAL_COMMUNITY): Payer: Medicare Other

## 2011-09-01 DIAGNOSIS — S72043A Displaced fracture of base of neck of unspecified femur, initial encounter for closed fracture: Secondary | ICD-10-CM | POA: Diagnosis not present

## 2011-09-02 ENCOUNTER — Encounter (HOSPITAL_COMMUNITY): Payer: Medicare Other

## 2011-09-03 DIAGNOSIS — S72043A Displaced fracture of base of neck of unspecified femur, initial encounter for closed fracture: Secondary | ICD-10-CM | POA: Diagnosis not present

## 2011-09-06 DIAGNOSIS — S72043A Displaced fracture of base of neck of unspecified femur, initial encounter for closed fracture: Secondary | ICD-10-CM | POA: Diagnosis not present

## 2011-09-07 ENCOUNTER — Encounter (HOSPITAL_COMMUNITY): Payer: Medicare Other

## 2011-09-08 ENCOUNTER — Encounter (HOSPITAL_COMMUNITY): Payer: Medicare Other

## 2011-09-08 DIAGNOSIS — S42293A Other displaced fracture of upper end of unspecified humerus, initial encounter for closed fracture: Secondary | ICD-10-CM | POA: Diagnosis not present

## 2011-09-09 ENCOUNTER — Encounter (HOSPITAL_COMMUNITY): Payer: Medicare Other

## 2011-09-14 ENCOUNTER — Encounter (HOSPITAL_COMMUNITY): Payer: Medicare Other

## 2011-09-15 ENCOUNTER — Encounter (HOSPITAL_COMMUNITY): Payer: Medicare Other

## 2011-09-15 DIAGNOSIS — S42293A Other displaced fracture of upper end of unspecified humerus, initial encounter for closed fracture: Secondary | ICD-10-CM | POA: Diagnosis not present

## 2011-09-16 ENCOUNTER — Encounter (HOSPITAL_COMMUNITY): Payer: Medicare Other

## 2011-09-17 DIAGNOSIS — S42293A Other displaced fracture of upper end of unspecified humerus, initial encounter for closed fracture: Secondary | ICD-10-CM | POA: Diagnosis not present

## 2011-09-21 ENCOUNTER — Encounter (HOSPITAL_COMMUNITY): Payer: Medicare Other

## 2011-09-21 DIAGNOSIS — F4323 Adjustment disorder with mixed anxiety and depressed mood: Secondary | ICD-10-CM | POA: Diagnosis not present

## 2011-09-22 ENCOUNTER — Other Ambulatory Visit: Payer: Self-pay | Admitting: Dermatology

## 2011-09-22 ENCOUNTER — Encounter (HOSPITAL_COMMUNITY): Payer: Medicare Other

## 2011-09-22 DIAGNOSIS — D485 Neoplasm of uncertain behavior of skin: Secondary | ICD-10-CM | POA: Diagnosis not present

## 2011-09-22 DIAGNOSIS — D239 Other benign neoplasm of skin, unspecified: Secondary | ICD-10-CM | POA: Diagnosis not present

## 2011-09-22 DIAGNOSIS — L851 Acquired keratosis [keratoderma] palmaris et plantaris: Secondary | ICD-10-CM | POA: Diagnosis not present

## 2011-09-22 DIAGNOSIS — L821 Other seborrheic keratosis: Secondary | ICD-10-CM | POA: Diagnosis not present

## 2011-09-23 ENCOUNTER — Encounter (HOSPITAL_COMMUNITY): Payer: Medicare Other

## 2011-09-24 DIAGNOSIS — S42293A Other displaced fracture of upper end of unspecified humerus, initial encounter for closed fracture: Secondary | ICD-10-CM | POA: Diagnosis not present

## 2011-09-27 DIAGNOSIS — G5620 Lesion of ulnar nerve, unspecified upper limb: Secondary | ICD-10-CM | POA: Diagnosis not present

## 2011-09-27 DIAGNOSIS — S42293A Other displaced fracture of upper end of unspecified humerus, initial encounter for closed fracture: Secondary | ICD-10-CM | POA: Diagnosis not present

## 2011-09-27 DIAGNOSIS — S72043A Displaced fracture of base of neck of unspecified femur, initial encounter for closed fracture: Secondary | ICD-10-CM | POA: Diagnosis not present

## 2011-09-28 ENCOUNTER — Encounter (HOSPITAL_COMMUNITY): Payer: Medicare Other

## 2011-09-28 DIAGNOSIS — S42293A Other displaced fracture of upper end of unspecified humerus, initial encounter for closed fracture: Secondary | ICD-10-CM | POA: Diagnosis not present

## 2011-09-28 DIAGNOSIS — S72009A Fracture of unspecified part of neck of unspecified femur, initial encounter for closed fracture: Secondary | ICD-10-CM | POA: Diagnosis not present

## 2011-09-29 ENCOUNTER — Encounter (HOSPITAL_COMMUNITY): Payer: Medicare Other

## 2011-09-30 ENCOUNTER — Encounter (HOSPITAL_COMMUNITY): Payer: Medicare Other

## 2011-10-01 DIAGNOSIS — S72009A Fracture of unspecified part of neck of unspecified femur, initial encounter for closed fracture: Secondary | ICD-10-CM | POA: Diagnosis not present

## 2011-10-04 DIAGNOSIS — S72009A Fracture of unspecified part of neck of unspecified femur, initial encounter for closed fracture: Secondary | ICD-10-CM | POA: Diagnosis not present

## 2011-10-05 ENCOUNTER — Encounter (HOSPITAL_COMMUNITY): Payer: Medicare Other

## 2011-10-05 DIAGNOSIS — F4323 Adjustment disorder with mixed anxiety and depressed mood: Secondary | ICD-10-CM | POA: Diagnosis not present

## 2011-10-06 ENCOUNTER — Encounter (HOSPITAL_COMMUNITY): Payer: Medicare Other

## 2011-10-06 DIAGNOSIS — S72009A Fracture of unspecified part of neck of unspecified femur, initial encounter for closed fracture: Secondary | ICD-10-CM | POA: Diagnosis not present

## 2011-10-07 ENCOUNTER — Encounter (HOSPITAL_COMMUNITY): Payer: Medicare Other

## 2011-10-11 DIAGNOSIS — S72009A Fracture of unspecified part of neck of unspecified femur, initial encounter for closed fracture: Secondary | ICD-10-CM | POA: Diagnosis not present

## 2011-10-12 ENCOUNTER — Encounter (HOSPITAL_COMMUNITY): Payer: Medicare Other

## 2011-10-13 ENCOUNTER — Encounter (HOSPITAL_COMMUNITY): Payer: Medicare Other

## 2011-10-13 DIAGNOSIS — S42293A Other displaced fracture of upper end of unspecified humerus, initial encounter for closed fracture: Secondary | ICD-10-CM | POA: Diagnosis not present

## 2011-10-13 DIAGNOSIS — S72009A Fracture of unspecified part of neck of unspecified femur, initial encounter for closed fracture: Secondary | ICD-10-CM | POA: Diagnosis not present

## 2011-10-14 ENCOUNTER — Encounter (HOSPITAL_COMMUNITY): Payer: Medicare Other

## 2011-10-19 ENCOUNTER — Encounter (HOSPITAL_COMMUNITY): Payer: Medicare Other

## 2011-10-20 ENCOUNTER — Encounter (HOSPITAL_COMMUNITY): Payer: Medicare Other

## 2011-10-20 DIAGNOSIS — S72009A Fracture of unspecified part of neck of unspecified femur, initial encounter for closed fracture: Secondary | ICD-10-CM | POA: Diagnosis not present

## 2011-10-20 DIAGNOSIS — S42293A Other displaced fracture of upper end of unspecified humerus, initial encounter for closed fracture: Secondary | ICD-10-CM | POA: Diagnosis not present

## 2011-10-21 ENCOUNTER — Encounter (HOSPITAL_COMMUNITY): Payer: Medicare Other

## 2011-10-22 DIAGNOSIS — S72009A Fracture of unspecified part of neck of unspecified femur, initial encounter for closed fracture: Secondary | ICD-10-CM | POA: Diagnosis not present

## 2011-10-22 DIAGNOSIS — S42293A Other displaced fracture of upper end of unspecified humerus, initial encounter for closed fracture: Secondary | ICD-10-CM | POA: Diagnosis not present

## 2011-10-26 ENCOUNTER — Encounter (HOSPITAL_COMMUNITY): Payer: Medicare Other

## 2011-10-27 ENCOUNTER — Encounter (HOSPITAL_COMMUNITY): Payer: Medicare Other

## 2011-10-27 DIAGNOSIS — S72009A Fracture of unspecified part of neck of unspecified femur, initial encounter for closed fracture: Secondary | ICD-10-CM | POA: Diagnosis not present

## 2011-10-28 ENCOUNTER — Encounter (HOSPITAL_COMMUNITY): Payer: Medicare Other

## 2011-11-02 ENCOUNTER — Encounter (HOSPITAL_COMMUNITY): Payer: Medicare Other

## 2011-11-02 DIAGNOSIS — I252 Old myocardial infarction: Secondary | ICD-10-CM | POA: Diagnosis not present

## 2011-11-02 DIAGNOSIS — I2510 Atherosclerotic heart disease of native coronary artery without angina pectoris: Secondary | ICD-10-CM | POA: Diagnosis not present

## 2011-11-02 DIAGNOSIS — E782 Mixed hyperlipidemia: Secondary | ICD-10-CM | POA: Diagnosis not present

## 2011-11-02 DIAGNOSIS — S72009A Fracture of unspecified part of neck of unspecified femur, initial encounter for closed fracture: Secondary | ICD-10-CM | POA: Diagnosis not present

## 2011-11-02 DIAGNOSIS — D649 Anemia, unspecified: Secondary | ICD-10-CM | POA: Diagnosis not present

## 2011-11-03 ENCOUNTER — Encounter (HOSPITAL_COMMUNITY): Payer: Medicare Other

## 2011-11-04 ENCOUNTER — Encounter (HOSPITAL_COMMUNITY): Payer: Medicare Other

## 2011-11-09 ENCOUNTER — Encounter (HOSPITAL_COMMUNITY): Payer: Medicare Other

## 2011-11-09 DIAGNOSIS — S72009A Fracture of unspecified part of neck of unspecified femur, initial encounter for closed fracture: Secondary | ICD-10-CM | POA: Diagnosis not present

## 2011-11-09 DIAGNOSIS — S42293A Other displaced fracture of upper end of unspecified humerus, initial encounter for closed fracture: Secondary | ICD-10-CM | POA: Diagnosis not present

## 2011-11-10 ENCOUNTER — Encounter (HOSPITAL_COMMUNITY): Payer: Medicare Other

## 2011-11-11 ENCOUNTER — Encounter (HOSPITAL_COMMUNITY): Payer: Medicare Other

## 2011-11-16 ENCOUNTER — Encounter (HOSPITAL_COMMUNITY): Payer: Medicare Other

## 2011-11-16 DIAGNOSIS — S72009A Fracture of unspecified part of neck of unspecified femur, initial encounter for closed fracture: Secondary | ICD-10-CM | POA: Diagnosis not present

## 2011-11-17 ENCOUNTER — Encounter (HOSPITAL_COMMUNITY): Payer: Medicare Other

## 2011-11-18 ENCOUNTER — Encounter (HOSPITAL_COMMUNITY): Payer: Medicare Other

## 2011-11-23 ENCOUNTER — Encounter (HOSPITAL_COMMUNITY): Payer: Medicare Other

## 2011-11-24 ENCOUNTER — Encounter (HOSPITAL_COMMUNITY): Payer: Medicare Other

## 2011-11-25 ENCOUNTER — Encounter (HOSPITAL_COMMUNITY): Payer: Medicare Other

## 2011-11-30 ENCOUNTER — Encounter (HOSPITAL_COMMUNITY): Payer: Medicare Other

## 2011-12-01 ENCOUNTER — Encounter (HOSPITAL_COMMUNITY): Payer: Medicare Other

## 2011-12-02 ENCOUNTER — Encounter (HOSPITAL_COMMUNITY): Payer: Medicare Other

## 2011-12-06 DIAGNOSIS — M810 Age-related osteoporosis without current pathological fracture: Secondary | ICD-10-CM | POA: Diagnosis not present

## 2011-12-06 DIAGNOSIS — Z124 Encounter for screening for malignant neoplasm of cervix: Secondary | ICD-10-CM | POA: Diagnosis not present

## 2011-12-06 DIAGNOSIS — Z01419 Encounter for gynecological examination (general) (routine) without abnormal findings: Secondary | ICD-10-CM | POA: Diagnosis not present

## 2011-12-06 DIAGNOSIS — E559 Vitamin D deficiency, unspecified: Secondary | ICD-10-CM | POA: Diagnosis not present

## 2011-12-07 ENCOUNTER — Encounter (HOSPITAL_COMMUNITY): Payer: Medicare Other

## 2011-12-07 ENCOUNTER — Other Ambulatory Visit: Payer: Self-pay | Admitting: Obstetrics

## 2011-12-07 DIAGNOSIS — M810 Age-related osteoporosis without current pathological fracture: Secondary | ICD-10-CM

## 2011-12-08 ENCOUNTER — Encounter (HOSPITAL_COMMUNITY): Payer: Medicare Other

## 2011-12-09 ENCOUNTER — Encounter (HOSPITAL_COMMUNITY): Payer: Medicare Other

## 2011-12-14 ENCOUNTER — Encounter (HOSPITAL_COMMUNITY): Payer: Medicare Other

## 2011-12-15 ENCOUNTER — Encounter (HOSPITAL_COMMUNITY): Payer: Medicare Other

## 2011-12-16 ENCOUNTER — Encounter (HOSPITAL_COMMUNITY): Payer: Medicare Other

## 2011-12-16 ENCOUNTER — Inpatient Hospital Stay: Admission: RE | Admit: 2011-12-16 | Payer: Medicare Other | Source: Ambulatory Visit

## 2011-12-20 ENCOUNTER — Ambulatory Visit (HOSPITAL_COMMUNITY)
Admission: RE | Admit: 2011-12-20 | Discharge: 2011-12-20 | Disposition: A | Discharge status: Home / Self Care | Payer: Medicare Other | Source: Ambulatory Visit | Attending: Obstetrics | Admitting: Obstetrics

## 2011-12-20 DIAGNOSIS — Z13820 Encounter for screening for osteoporosis: Secondary | ICD-10-CM | POA: Insufficient documentation

## 2011-12-20 DIAGNOSIS — M899 Disorder of bone, unspecified: Secondary | ICD-10-CM | POA: Diagnosis not present

## 2011-12-20 DIAGNOSIS — M810 Age-related osteoporosis without current pathological fracture: Secondary | ICD-10-CM

## 2011-12-20 DIAGNOSIS — Z780 Asymptomatic menopausal state: Secondary | ICD-10-CM | POA: Insufficient documentation

## 2011-12-21 ENCOUNTER — Encounter (HOSPITAL_COMMUNITY): Payer: Medicare Other

## 2011-12-22 ENCOUNTER — Encounter (HOSPITAL_COMMUNITY): Payer: Medicare Other

## 2011-12-23 ENCOUNTER — Encounter (HOSPITAL_COMMUNITY): Payer: Medicare Other

## 2011-12-28 ENCOUNTER — Encounter (HOSPITAL_COMMUNITY): Payer: Medicare Other

## 2011-12-29 ENCOUNTER — Encounter (HOSPITAL_COMMUNITY): Payer: Medicare Other

## 2011-12-30 ENCOUNTER — Encounter (HOSPITAL_COMMUNITY): Payer: Medicare Other

## 2011-12-31 DIAGNOSIS — H3530 Unspecified macular degeneration: Secondary | ICD-10-CM | POA: Diagnosis not present

## 2011-12-31 DIAGNOSIS — H43819 Vitreous degeneration, unspecified eye: Secondary | ICD-10-CM | POA: Diagnosis not present

## 2011-12-31 DIAGNOSIS — H52209 Unspecified astigmatism, unspecified eye: Secondary | ICD-10-CM | POA: Diagnosis not present

## 2011-12-31 DIAGNOSIS — Z961 Presence of intraocular lens: Secondary | ICD-10-CM | POA: Diagnosis not present

## 2012-01-04 ENCOUNTER — Encounter (HOSPITAL_COMMUNITY): Payer: Medicare Other

## 2012-01-05 ENCOUNTER — Encounter (HOSPITAL_COMMUNITY): Payer: Medicare Other

## 2012-01-06 ENCOUNTER — Encounter (HOSPITAL_COMMUNITY): Payer: Medicare Other

## 2012-01-11 ENCOUNTER — Encounter (HOSPITAL_COMMUNITY): Payer: Medicare Other

## 2012-01-12 ENCOUNTER — Encounter (HOSPITAL_COMMUNITY): Payer: Medicare Other

## 2012-01-12 DIAGNOSIS — I10 Essential (primary) hypertension: Secondary | ICD-10-CM | POA: Diagnosis not present

## 2012-01-12 DIAGNOSIS — M810 Age-related osteoporosis without current pathological fracture: Secondary | ICD-10-CM | POA: Diagnosis not present

## 2012-01-12 DIAGNOSIS — D649 Anemia, unspecified: Secondary | ICD-10-CM | POA: Diagnosis not present

## 2012-01-12 DIAGNOSIS — G4733 Obstructive sleep apnea (adult) (pediatric): Secondary | ICD-10-CM | POA: Diagnosis not present

## 2012-01-12 DIAGNOSIS — E782 Mixed hyperlipidemia: Secondary | ICD-10-CM | POA: Diagnosis not present

## 2012-01-12 DIAGNOSIS — I2510 Atherosclerotic heart disease of native coronary artery without angina pectoris: Secondary | ICD-10-CM | POA: Diagnosis not present

## 2012-01-12 DIAGNOSIS — Z1331 Encounter for screening for depression: Secondary | ICD-10-CM | POA: Diagnosis not present

## 2012-01-12 DIAGNOSIS — Z0000 Encounter for general adult medical examination without abnormal findings: Secondary | ICD-10-CM | POA: Diagnosis not present

## 2012-01-13 ENCOUNTER — Encounter (HOSPITAL_COMMUNITY): Payer: Medicare Other

## 2012-01-13 DIAGNOSIS — C50919 Malignant neoplasm of unspecified site of unspecified female breast: Secondary | ICD-10-CM | POA: Diagnosis not present

## 2012-01-13 DIAGNOSIS — D0590 Unspecified type of carcinoma in situ of unspecified breast: Secondary | ICD-10-CM | POA: Diagnosis not present

## 2012-01-13 DIAGNOSIS — N6489 Other specified disorders of breast: Secondary | ICD-10-CM | POA: Diagnosis not present

## 2012-01-13 DIAGNOSIS — R928 Other abnormal and inconclusive findings on diagnostic imaging of breast: Secondary | ICD-10-CM | POA: Diagnosis not present

## 2012-01-18 ENCOUNTER — Encounter (HOSPITAL_COMMUNITY): Payer: Medicare Other

## 2012-01-18 DIAGNOSIS — M818 Other osteoporosis without current pathological fracture: Secondary | ICD-10-CM | POA: Diagnosis not present

## 2012-01-18 DIAGNOSIS — M810 Age-related osteoporosis without current pathological fracture: Secondary | ICD-10-CM | POA: Diagnosis not present

## 2012-01-19 ENCOUNTER — Encounter (HOSPITAL_COMMUNITY): Payer: Medicare Other

## 2012-01-20 ENCOUNTER — Encounter (HOSPITAL_COMMUNITY): Payer: Medicare Other

## 2012-01-25 ENCOUNTER — Encounter (HOSPITAL_COMMUNITY): Payer: Medicare Other

## 2012-01-26 ENCOUNTER — Encounter (HOSPITAL_COMMUNITY): Payer: Medicare Other

## 2012-01-27 ENCOUNTER — Encounter (HOSPITAL_COMMUNITY): Payer: Medicare Other

## 2012-02-01 ENCOUNTER — Encounter (HOSPITAL_COMMUNITY): Payer: Medicare Other

## 2012-02-02 ENCOUNTER — Encounter (HOSPITAL_COMMUNITY): Payer: Medicare Other

## 2012-02-03 ENCOUNTER — Encounter (HOSPITAL_COMMUNITY): Payer: Medicare Other

## 2012-02-08 ENCOUNTER — Encounter (HOSPITAL_COMMUNITY): Payer: Medicare Other

## 2012-02-09 ENCOUNTER — Encounter (HOSPITAL_COMMUNITY): Payer: Medicare Other

## 2012-02-10 ENCOUNTER — Encounter (HOSPITAL_COMMUNITY): Payer: Medicare Other

## 2012-02-15 ENCOUNTER — Encounter (HOSPITAL_COMMUNITY): Payer: Medicare Other

## 2012-02-16 ENCOUNTER — Encounter (HOSPITAL_COMMUNITY): Payer: Medicare Other

## 2012-02-17 ENCOUNTER — Encounter (HOSPITAL_COMMUNITY): Payer: Medicare Other

## 2012-02-22 ENCOUNTER — Encounter (HOSPITAL_COMMUNITY): Payer: Medicare Other

## 2012-02-23 ENCOUNTER — Encounter (HOSPITAL_COMMUNITY): Payer: Medicare Other

## 2012-02-23 DIAGNOSIS — I10 Essential (primary) hypertension: Secondary | ICD-10-CM | POA: Diagnosis not present

## 2012-02-23 DIAGNOSIS — E669 Obesity, unspecified: Secondary | ICD-10-CM | POA: Diagnosis not present

## 2012-02-23 DIAGNOSIS — G4733 Obstructive sleep apnea (adult) (pediatric): Secondary | ICD-10-CM | POA: Diagnosis not present

## 2012-02-24 ENCOUNTER — Encounter (HOSPITAL_COMMUNITY): Payer: Medicare Other

## 2012-02-29 ENCOUNTER — Encounter (HOSPITAL_COMMUNITY): Payer: Medicare Other

## 2012-03-01 ENCOUNTER — Encounter (HOSPITAL_COMMUNITY): Payer: Medicare Other

## 2012-03-02 ENCOUNTER — Encounter (HOSPITAL_COMMUNITY): Payer: Medicare Other

## 2012-03-07 ENCOUNTER — Encounter (HOSPITAL_COMMUNITY): Payer: Medicare Other

## 2012-03-08 ENCOUNTER — Encounter (HOSPITAL_COMMUNITY): Payer: Medicare Other

## 2012-03-09 ENCOUNTER — Encounter (HOSPITAL_COMMUNITY): Payer: Medicare Other

## 2012-03-10 DIAGNOSIS — Z23 Encounter for immunization: Secondary | ICD-10-CM | POA: Diagnosis not present

## 2012-03-14 ENCOUNTER — Encounter (HOSPITAL_COMMUNITY): Payer: Medicare Other

## 2012-03-15 ENCOUNTER — Encounter (HOSPITAL_COMMUNITY): Payer: Medicare Other

## 2012-03-16 ENCOUNTER — Encounter (HOSPITAL_COMMUNITY): Payer: Medicare Other

## 2012-03-16 DIAGNOSIS — S72009A Fracture of unspecified part of neck of unspecified femur, initial encounter for closed fracture: Secondary | ICD-10-CM | POA: Diagnosis not present

## 2012-03-21 ENCOUNTER — Encounter (HOSPITAL_COMMUNITY): Payer: Medicare Other

## 2012-03-22 ENCOUNTER — Encounter (HOSPITAL_COMMUNITY): Payer: Medicare Other

## 2012-03-22 DIAGNOSIS — M545 Low back pain: Secondary | ICD-10-CM | POA: Diagnosis not present

## 2012-03-23 ENCOUNTER — Encounter (HOSPITAL_COMMUNITY): Payer: Medicare Other

## 2012-03-28 ENCOUNTER — Encounter (HOSPITAL_COMMUNITY): Payer: Medicare Other

## 2012-03-29 ENCOUNTER — Encounter (HOSPITAL_COMMUNITY): Payer: Medicare Other

## 2012-03-29 DIAGNOSIS — M545 Low back pain: Secondary | ICD-10-CM | POA: Diagnosis not present

## 2012-03-30 ENCOUNTER — Encounter (HOSPITAL_COMMUNITY): Payer: Medicare Other

## 2012-04-04 ENCOUNTER — Encounter (HOSPITAL_COMMUNITY): Payer: Medicare Other

## 2012-04-05 ENCOUNTER — Encounter (HOSPITAL_COMMUNITY): Payer: Medicare Other

## 2012-04-05 DIAGNOSIS — M545 Low back pain: Secondary | ICD-10-CM | POA: Diagnosis not present

## 2012-04-06 ENCOUNTER — Encounter (HOSPITAL_COMMUNITY): Payer: Medicare Other

## 2012-04-11 ENCOUNTER — Encounter (HOSPITAL_COMMUNITY): Payer: Medicare Other

## 2012-04-12 ENCOUNTER — Encounter (HOSPITAL_COMMUNITY): Payer: Medicare Other

## 2012-04-12 DIAGNOSIS — M545 Low back pain: Secondary | ICD-10-CM | POA: Diagnosis not present

## 2012-04-13 ENCOUNTER — Encounter (HOSPITAL_COMMUNITY): Payer: Medicare Other

## 2012-04-18 ENCOUNTER — Encounter (HOSPITAL_COMMUNITY): Payer: Medicare Other

## 2012-04-19 ENCOUNTER — Encounter (HOSPITAL_COMMUNITY): Payer: Medicare Other

## 2012-04-20 ENCOUNTER — Encounter (HOSPITAL_COMMUNITY): Payer: Medicare Other

## 2012-04-25 ENCOUNTER — Encounter (HOSPITAL_COMMUNITY): Payer: Medicare Other

## 2012-04-26 ENCOUNTER — Encounter (HOSPITAL_COMMUNITY): Payer: Medicare Other

## 2012-04-27 ENCOUNTER — Encounter (HOSPITAL_COMMUNITY): Payer: Medicare Other

## 2012-05-02 ENCOUNTER — Encounter (HOSPITAL_COMMUNITY): Payer: Medicare Other

## 2012-05-02 DIAGNOSIS — S72009A Fracture of unspecified part of neck of unspecified femur, initial encounter for closed fracture: Secondary | ICD-10-CM | POA: Diagnosis not present

## 2012-05-02 DIAGNOSIS — E782 Mixed hyperlipidemia: Secondary | ICD-10-CM | POA: Diagnosis not present

## 2012-05-02 DIAGNOSIS — I2510 Atherosclerotic heart disease of native coronary artery without angina pectoris: Secondary | ICD-10-CM | POA: Diagnosis not present

## 2012-05-02 DIAGNOSIS — I252 Old myocardial infarction: Secondary | ICD-10-CM | POA: Diagnosis not present

## 2012-05-03 ENCOUNTER — Encounter (HOSPITAL_COMMUNITY): Payer: Medicare Other

## 2012-05-04 ENCOUNTER — Encounter (HOSPITAL_COMMUNITY): Payer: Medicare Other

## 2012-05-09 ENCOUNTER — Encounter (HOSPITAL_COMMUNITY): Payer: Medicare Other

## 2012-05-10 ENCOUNTER — Encounter (HOSPITAL_COMMUNITY): Payer: Medicare Other

## 2012-05-11 ENCOUNTER — Encounter (HOSPITAL_COMMUNITY): Payer: Medicare Other

## 2012-05-16 ENCOUNTER — Encounter (HOSPITAL_COMMUNITY): Payer: Medicare Other

## 2012-05-17 ENCOUNTER — Encounter (HOSPITAL_COMMUNITY): Payer: Medicare Other

## 2012-05-18 ENCOUNTER — Encounter (HOSPITAL_COMMUNITY): Payer: Medicare Other

## 2012-05-23 ENCOUNTER — Encounter (HOSPITAL_COMMUNITY): Payer: Medicare Other

## 2012-05-24 ENCOUNTER — Encounter (HOSPITAL_COMMUNITY): Payer: Medicare Other

## 2012-05-25 ENCOUNTER — Encounter (HOSPITAL_COMMUNITY): Payer: Medicare Other

## 2012-05-26 DIAGNOSIS — Z79899 Other long term (current) drug therapy: Secondary | ICD-10-CM | POA: Diagnosis not present

## 2012-05-26 DIAGNOSIS — E7800 Pure hypercholesterolemia, unspecified: Secondary | ICD-10-CM | POA: Diagnosis not present

## 2012-05-30 ENCOUNTER — Encounter (HOSPITAL_COMMUNITY): Payer: Medicare Other

## 2012-05-31 ENCOUNTER — Encounter (HOSPITAL_COMMUNITY): Payer: Medicare Other

## 2012-06-01 ENCOUNTER — Encounter (HOSPITAL_COMMUNITY): Payer: Medicare Other

## 2012-06-06 ENCOUNTER — Encounter (HOSPITAL_COMMUNITY): Payer: Medicare Other

## 2012-06-07 ENCOUNTER — Encounter (HOSPITAL_COMMUNITY): Payer: Medicare Other

## 2012-06-08 ENCOUNTER — Encounter (HOSPITAL_COMMUNITY): Payer: Medicare Other

## 2012-06-09 DIAGNOSIS — M79609 Pain in unspecified limb: Secondary | ICD-10-CM | POA: Diagnosis not present

## 2012-06-09 DIAGNOSIS — B351 Tinea unguium: Secondary | ICD-10-CM | POA: Diagnosis not present

## 2012-06-13 ENCOUNTER — Encounter (HOSPITAL_COMMUNITY): Payer: Medicare Other

## 2012-06-14 ENCOUNTER — Encounter (HOSPITAL_COMMUNITY): Payer: Medicare Other

## 2012-06-15 ENCOUNTER — Encounter (HOSPITAL_COMMUNITY): Payer: Medicare Other

## 2012-06-20 ENCOUNTER — Encounter (HOSPITAL_COMMUNITY): Payer: Medicare Other

## 2012-06-21 ENCOUNTER — Encounter (HOSPITAL_COMMUNITY): Payer: Medicare Other

## 2012-06-22 ENCOUNTER — Encounter (HOSPITAL_COMMUNITY): Payer: Medicare Other

## 2012-06-27 DIAGNOSIS — M810 Age-related osteoporosis without current pathological fracture: Secondary | ICD-10-CM | POA: Diagnosis not present

## 2012-06-29 DIAGNOSIS — Z961 Presence of intraocular lens: Secondary | ICD-10-CM | POA: Diagnosis not present

## 2012-06-29 DIAGNOSIS — H3530 Unspecified macular degeneration: Secondary | ICD-10-CM | POA: Diagnosis not present

## 2012-06-29 DIAGNOSIS — H52209 Unspecified astigmatism, unspecified eye: Secondary | ICD-10-CM | POA: Diagnosis not present

## 2012-06-29 DIAGNOSIS — H2640 Unspecified secondary cataract: Secondary | ICD-10-CM | POA: Diagnosis not present

## 2012-07-31 DIAGNOSIS — E559 Vitamin D deficiency, unspecified: Secondary | ICD-10-CM | POA: Diagnosis not present

## 2012-08-04 ENCOUNTER — Other Ambulatory Visit: Payer: Self-pay | Admitting: Dermatology

## 2012-08-04 DIAGNOSIS — L820 Inflamed seborrheic keratosis: Secondary | ICD-10-CM | POA: Diagnosis not present

## 2012-08-04 DIAGNOSIS — D485 Neoplasm of uncertain behavior of skin: Secondary | ICD-10-CM | POA: Diagnosis not present

## 2012-08-04 DIAGNOSIS — L821 Other seborrheic keratosis: Secondary | ICD-10-CM | POA: Diagnosis not present

## 2012-08-24 DIAGNOSIS — G4733 Obstructive sleep apnea (adult) (pediatric): Secondary | ICD-10-CM | POA: Diagnosis not present

## 2012-08-24 DIAGNOSIS — I10 Essential (primary) hypertension: Secondary | ICD-10-CM | POA: Diagnosis not present

## 2012-08-24 DIAGNOSIS — E669 Obesity, unspecified: Secondary | ICD-10-CM | POA: Diagnosis not present

## 2012-10-31 DIAGNOSIS — I252 Old myocardial infarction: Secondary | ICD-10-CM | POA: Diagnosis not present

## 2012-10-31 DIAGNOSIS — I2510 Atherosclerotic heart disease of native coronary artery without angina pectoris: Secondary | ICD-10-CM | POA: Diagnosis not present

## 2012-10-31 DIAGNOSIS — E782 Mixed hyperlipidemia: Secondary | ICD-10-CM | POA: Diagnosis not present

## 2012-12-05 DIAGNOSIS — L659 Nonscarring hair loss, unspecified: Secondary | ICD-10-CM | POA: Diagnosis not present

## 2012-12-05 DIAGNOSIS — M810 Age-related osteoporosis without current pathological fracture: Secondary | ICD-10-CM | POA: Diagnosis not present

## 2012-12-05 DIAGNOSIS — Z124 Encounter for screening for malignant neoplasm of cervix: Secondary | ICD-10-CM | POA: Diagnosis not present

## 2012-12-05 DIAGNOSIS — E559 Vitamin D deficiency, unspecified: Secondary | ICD-10-CM | POA: Diagnosis not present

## 2012-12-27 DIAGNOSIS — H3530 Unspecified macular degeneration: Secondary | ICD-10-CM | POA: Diagnosis not present

## 2012-12-27 DIAGNOSIS — H43819 Vitreous degeneration, unspecified eye: Secondary | ICD-10-CM | POA: Diagnosis not present

## 2012-12-27 DIAGNOSIS — Z961 Presence of intraocular lens: Secondary | ICD-10-CM | POA: Diagnosis not present

## 2013-01-16 DIAGNOSIS — Z79899 Other long term (current) drug therapy: Secondary | ICD-10-CM | POA: Diagnosis not present

## 2013-01-16 DIAGNOSIS — I2510 Atherosclerotic heart disease of native coronary artery without angina pectoris: Secondary | ICD-10-CM | POA: Diagnosis not present

## 2013-01-16 DIAGNOSIS — Z0000 Encounter for general adult medical examination without abnormal findings: Secondary | ICD-10-CM | POA: Diagnosis not present

## 2013-01-16 DIAGNOSIS — E669 Obesity, unspecified: Secondary | ICD-10-CM | POA: Diagnosis not present

## 2013-01-16 DIAGNOSIS — I10 Essential (primary) hypertension: Secondary | ICD-10-CM | POA: Diagnosis not present

## 2013-01-16 DIAGNOSIS — E782 Mixed hyperlipidemia: Secondary | ICD-10-CM | POA: Diagnosis not present

## 2013-01-16 DIAGNOSIS — G4733 Obstructive sleep apnea (adult) (pediatric): Secondary | ICD-10-CM | POA: Diagnosis not present

## 2013-01-16 DIAGNOSIS — Z1331 Encounter for screening for depression: Secondary | ICD-10-CM | POA: Diagnosis not present

## 2013-01-16 DIAGNOSIS — L659 Nonscarring hair loss, unspecified: Secondary | ICD-10-CM | POA: Diagnosis not present

## 2013-01-16 DIAGNOSIS — D649 Anemia, unspecified: Secondary | ICD-10-CM | POA: Diagnosis not present

## 2013-01-16 DIAGNOSIS — Z23 Encounter for immunization: Secondary | ICD-10-CM | POA: Diagnosis not present

## 2013-02-18 ENCOUNTER — Encounter: Payer: Self-pay | Admitting: Cardiology

## 2013-02-18 ENCOUNTER — Encounter: Payer: Self-pay | Admitting: *Deleted

## 2013-02-18 DIAGNOSIS — E785 Hyperlipidemia, unspecified: Secondary | ICD-10-CM | POA: Insufficient documentation

## 2013-02-18 DIAGNOSIS — K219 Gastro-esophageal reflux disease without esophagitis: Secondary | ICD-10-CM | POA: Insufficient documentation

## 2013-02-18 DIAGNOSIS — M75100 Unspecified rotator cuff tear or rupture of unspecified shoulder, not specified as traumatic: Secondary | ICD-10-CM | POA: Insufficient documentation

## 2013-02-18 DIAGNOSIS — D649 Anemia, unspecified: Secondary | ICD-10-CM | POA: Insufficient documentation

## 2013-02-18 DIAGNOSIS — I219 Acute myocardial infarction, unspecified: Secondary | ICD-10-CM | POA: Insufficient documentation

## 2013-02-18 DIAGNOSIS — M8580 Other specified disorders of bone density and structure, unspecified site: Secondary | ICD-10-CM | POA: Insufficient documentation

## 2013-02-18 DIAGNOSIS — J310 Chronic rhinitis: Secondary | ICD-10-CM | POA: Insufficient documentation

## 2013-02-22 ENCOUNTER — Encounter: Payer: Self-pay | Admitting: Cardiology

## 2013-02-22 DIAGNOSIS — R928 Other abnormal and inconclusive findings on diagnostic imaging of breast: Secondary | ICD-10-CM | POA: Diagnosis not present

## 2013-02-26 ENCOUNTER — Ambulatory Visit (INDEPENDENT_AMBULATORY_CARE_PROVIDER_SITE_OTHER): Payer: Medicare Other | Admitting: Cardiology

## 2013-02-26 ENCOUNTER — Encounter: Payer: Self-pay | Admitting: Cardiology

## 2013-02-26 VITALS — BP 118/72 | HR 80 | Ht 64.0 in | Wt 205.0 lb

## 2013-02-26 DIAGNOSIS — E669 Obesity, unspecified: Secondary | ICD-10-CM | POA: Insufficient documentation

## 2013-02-26 DIAGNOSIS — I10 Essential (primary) hypertension: Secondary | ICD-10-CM

## 2013-02-26 DIAGNOSIS — G4733 Obstructive sleep apnea (adult) (pediatric): Secondary | ICD-10-CM | POA: Diagnosis not present

## 2013-02-26 NOTE — Progress Notes (Signed)
[address] [place], [place]  [address] Phone: [phone] Fax:  [phone]  Date:  02/26/2013   ID:  [name], DOB 11-29-1928, MRN [id number]  PCP:  Pearla Dubonnet, MD  Cardiologist:  Everette Rank, MD  Sleep Medicine:  Armanda Magic, MD   History of Present Illness: [name] is a 79 y.o. female with a history of OSA, obesity and HTN.  She is doing well.  She is tolerating her CPAP well.  She tolerates her nasal mask without any problems. She feels the current pressure setting is adequate.  She feels refreshed when she gets up and has no daytime sleepiness.  She Tai Chi and water aerobics. for exercise.   Wt Readings from Last 3 Encounters:  08/05/11 192 lb 4.8 oz (87.227 kg)  06/05/11 204 lb 12.9 oz (92.9 kg)  06/05/11 204 lb 12.9 oz (92.9 kg)     Past Medical History  Diagnosis Date  . Hypertension   . Myocardial infarction   . Postop Acute blood loss anemia   . Postop Hyponatremia   . Postop Transfusion history   . Breast cancer   . Rhinitis   . Osteopenia   . Rotator cuff syndrome   . Hypercholesteremia   . Hyperlipemia   . Anemia   . GERD (gastroesophageal reflux disease)   . Hemorrhoids   . Colon polyps   . Macular degeneration   . Umbilical hernia   . Osteopenia   . Obstructive sleep apnea on CPAP   . Ulnar neuropathy   . Coronary artery disease 06/2006    STEMI inferior with BMS to mid RCA with mid basal inferior wall HK and luminal irrgularities elsewhere    Current Outpatient Prescriptions  Medication Sig Dispense Refill  . albuterol (PROVENTIL HFA;VENTOLIN HFA) 108 (90 BASE) MCG/ACT inhaler Inhale 2 puffs into the lungs every 4 (four) hours as needed for wheezing.      Marland Kitchen aspirin 81 MG tablet Take 81-160 mg by mouth daily. Depending on bruising      . cholecalciferol (VITAMIN D) 1000 UNITS tablet Take 1,000 Units by mouth at bedtime.      . Coenzyme Q10 (COQ10) 200 MG CAPS Take 1 tablet by mouth daily.      Marland Kitchen ezetimibe  (ZETIA) 10 MG tablet Take 10 mg by mouth every evening.       Marland Kitchen lisinopril (PRINIVIL,ZESTRIL) 20 MG tablet Take 20 mg by mouth daily.      . metoprolol succinate (TOPROL-XL) 25 MG 24 hr tablet Take 25 mg by mouth every evening.       . Multiple Vitamins-Minerals (PRESERVISION/LUTEIN) CAPS Take 1 capsule by mouth 2 (two) times daily.      . rosuvastatin (CRESTOR) 10 MG tablet Take 5 mg by mouth 2 (two) times a week. Tuesday and thursday       No current facility-administered medications for this visit.    Allergies:    Allergies  Allergen Reactions  . Chlorpheniramine Anaphylaxis  . Codeine Anaphylaxis    Social History:  The patient  reports that she has never smoked. She does not have any smokeless tobacco history on file. She reports that  drinks alcohol.   Family History:  The patient's family history is not on file.   ROS:  Please see the history of present illness.      All other systems reviewed and negative.   PHYSICAL EXAM: VS:  There were no vitals taken for this visit.  Well nourished, well developed, in no acute distress HEENT: normal Neck: no JVD Cardiac:  normal S1, S2; RRR; no murmur Lungs:  clear to auscultation bilaterally, no wheezing, rhonchi or rales Abd: soft, nontender, no hepatomegaly Ext: trace edema Skin: warm and dry  ASSESSMENT AND PLAN:  1. Obstructive Sleep Apnea doing well with current settings.  - will get CPAP download from DME 2.  HTN  - continue Lisinopril and Bystolic   3.  Obesity - she will continue her water aerobics and Tai Chi  Followup with me in 6 months  Signed, Armanda Magic, MD 02/26/2013 2:40 PM

## 2013-02-26 NOTE — Patient Instructions (Signed)
Your physician recommends that you schedule a follow-up appointment in: 6 Months with Dr. Mayford Knife  **Please remember to bring your download card with you to your next appt.**

## 2013-03-06 DIAGNOSIS — I10 Essential (primary) hypertension: Secondary | ICD-10-CM | POA: Diagnosis not present

## 2013-03-06 DIAGNOSIS — L659 Nonscarring hair loss, unspecified: Secondary | ICD-10-CM | POA: Diagnosis not present

## 2013-03-13 DIAGNOSIS — Z23 Encounter for immunization: Secondary | ICD-10-CM | POA: Diagnosis not present

## 2013-03-29 ENCOUNTER — Other Ambulatory Visit: Payer: Self-pay | Admitting: Dermatology

## 2013-03-29 DIAGNOSIS — L851 Acquired keratosis [keratoderma] palmaris et plantaris: Secondary | ICD-10-CM | POA: Diagnosis not present

## 2013-03-29 DIAGNOSIS — D235 Other benign neoplasm of skin of trunk: Secondary | ICD-10-CM | POA: Diagnosis not present

## 2013-03-29 DIAGNOSIS — D485 Neoplasm of uncertain behavior of skin: Secondary | ICD-10-CM | POA: Diagnosis not present

## 2013-03-29 DIAGNOSIS — L821 Other seborrheic keratosis: Secondary | ICD-10-CM | POA: Diagnosis not present

## 2013-04-05 ENCOUNTER — Encounter: Payer: Self-pay | Admitting: Interventional Cardiology

## 2013-05-25 ENCOUNTER — Other Ambulatory Visit: Payer: Self-pay | Admitting: Interventional Cardiology

## 2013-05-28 DIAGNOSIS — M810 Age-related osteoporosis without current pathological fracture: Secondary | ICD-10-CM | POA: Diagnosis not present

## 2013-06-13 DIAGNOSIS — J0410 Acute tracheitis without obstruction: Secondary | ICD-10-CM | POA: Diagnosis not present

## 2013-06-13 DIAGNOSIS — B9789 Other viral agents as the cause of diseases classified elsewhere: Secondary | ICD-10-CM | POA: Diagnosis not present

## 2013-06-28 DIAGNOSIS — Z961 Presence of intraocular lens: Secondary | ICD-10-CM | POA: Diagnosis not present

## 2013-06-28 DIAGNOSIS — H52209 Unspecified astigmatism, unspecified eye: Secondary | ICD-10-CM | POA: Diagnosis not present

## 2013-06-28 DIAGNOSIS — H3530 Unspecified macular degeneration: Secondary | ICD-10-CM | POA: Diagnosis not present

## 2013-06-28 DIAGNOSIS — H43819 Vitreous degeneration, unspecified eye: Secondary | ICD-10-CM | POA: Diagnosis not present

## 2013-07-01 ENCOUNTER — Other Ambulatory Visit: Payer: Self-pay | Admitting: Interventional Cardiology

## 2013-07-23 ENCOUNTER — Ambulatory Visit: Payer: Medicare Other | Admitting: Interventional Cardiology

## 2013-07-24 ENCOUNTER — Ambulatory Visit: Payer: Medicare Other | Admitting: Interventional Cardiology

## 2013-07-25 ENCOUNTER — Ambulatory Visit: Payer: Medicare Other | Admitting: Interventional Cardiology

## 2013-07-27 ENCOUNTER — Ambulatory Visit (INDEPENDENT_AMBULATORY_CARE_PROVIDER_SITE_OTHER): Payer: Medicare Other | Admitting: Interventional Cardiology

## 2013-07-27 ENCOUNTER — Encounter: Payer: Self-pay | Admitting: Interventional Cardiology

## 2013-07-27 VITALS — BP 140/60 | HR 63 | Ht 64.0 in | Wt 206.0 lb

## 2013-07-27 DIAGNOSIS — I219 Acute myocardial infarction, unspecified: Secondary | ICD-10-CM

## 2013-07-27 DIAGNOSIS — E785 Hyperlipidemia, unspecified: Secondary | ICD-10-CM

## 2013-07-27 DIAGNOSIS — I2510 Atherosclerotic heart disease of native coronary artery without angina pectoris: Secondary | ICD-10-CM | POA: Diagnosis not present

## 2013-07-27 DIAGNOSIS — I10 Essential (primary) hypertension: Secondary | ICD-10-CM | POA: Diagnosis not present

## 2013-07-27 NOTE — Progress Notes (Signed)
Patient ID: [name], female   DOB: [birth year], 80 y.o.   MRN: [id number]    [place], [address], [place]  [address] Phone: [phone] Fax:  [phone]  Date:  07/27/2013   ID:  [name], DOB 1929-04-21, MRN [id number]  PCP:  Henrine Screws, MD      History of Present Illness: [name] is a 80 y.o. female  who had an inferior MI in 2008. She has been doing well. Mild exercise in the pool.  She had  A BMS a that time. CAD/ASCVD:  She is back to using CPAP. Now lives in [place]. Denies : Chest pain.  Dizziness.  Dyspnea on exertion resolved.  Fatigue.  Leg edema.  Nitroglycerin.  Orthopnea.  Palpitations.  Paroxysmal nocturnal dyspnea.  Syncope.    Vital Signs      Wt Readings from Last 3 Encounters:  07/27/13 206 lb (93.441 kg)  02/26/13 205 lb (92.987 kg)  08/05/11 192 lb 4.8 oz (87.227 kg)     Past Medical History  Diagnosis Date  . Hypertension   . Myocardial infarction   . Postop Acute blood loss anemia   . Postop Hyponatremia   . Postop Transfusion history   . Breast cancer   . Rhinitis   . Osteopenia   . Rotator cuff syndrome   . Hypercholesteremia   . Hyperlipemia   . Anemia   . GERD (gastroesophageal reflux disease)   . Hemorrhoids   . Colon polyps   . Macular degeneration   . Umbilical hernia   . Osteopenia   . Obstructive sleep apnea on CPAP   . Ulnar neuropathy   . Coronary artery disease 06/2006    STEMI inferior with BMS to mid RCA with mid basal inferior wall HK and luminal irrgularities elsewhere    Current Outpatient Prescriptions  Medication Sig Dispense Refill  . aspirin 81 MG tablet Take 81-160 mg by mouth daily. Depending on bruising      . cholecalciferol (VITAMIN D) 1000 UNITS tablet Take 1,000 Units by mouth at bedtime.      . clopidogrel (PLAVIX) 75 MG tablet Take 75 mg by mouth daily.       . Coenzyme Q10 (COQ10) 200 MG CAPS Take 1 tablet by mouth daily.      Marland Kitchen lisinopril  (PRINIVIL,ZESTRIL) 20 MG tablet TAKE (1) TABLET DAILY.  30 tablet  2  . Multiple Vitamins-Minerals (PRESERVISION/LUTEIN) CAPS Take 1 capsule by mouth 2 (two) times daily.      . nebivolol (BYSTOLIC) 5 MG tablet Take 5 mg by mouth daily.      Marland Kitchen NITROSTAT 0.4 MG SL tablet       . rosuvastatin (CRESTOR) 10 MG tablet Take 5 mg by mouth 2 (two) times a week. Tuesday and thursday      . ZETIA 10 MG tablet TAKE 1 TABLET ONCE DAILY.  30 tablet  5   No current facility-administered medications for this visit.    Allergies:    Allergies  Allergen Reactions  . Chlorpheniramine Anaphylaxis  . Codeine Anaphylaxis    Social History:  The patient  reports that she has never smoked. She does not have any smokeless tobacco history on file. She reports that she drinks alcohol.   Family History:  The patient's family history includes CVA in her sister; Heart attack in her father.   ROS:  Please see the history of present illness.  No nausea, vomiting.  No  fevers, chills.  No focal weakness.  No dysuria.   All other systems reviewed and negative.   PHYSICAL EXAM: VS:  BP 140/60  Pulse 63  Ht 5\' 4"  (1.626 m)  Wt 206 lb (93.441 kg)  BMI 35.34 kg/m2  SpO2 99% Well nourished, well developed, in no acute distress HEENT: normal Neck: no JVD, no carotid bruits Cardiac:  normal S1, S2; RRR;  Lungs:  clear to auscultation bilaterally, no wheezing, rhonchi or rales Abd: soft, nontender, no hepatomegaly Ext: no edema Skin: warm and dry Neuro:   no focal abnormalities noted  EKG:  NSR, LBBB    ASSESSMENT AND PLAN:  Coronary atherosclerosis of native coronary artery  Stop Aspirin Tablet Delayed Release, 81 MG, 1 tablet, Orally, Once a day; Continue clopidogrel. IMAGING: EKG    Harward,Amy 10/31/2012 02:04:00 PM > Dorianna Mckiver,JAY 10/31/2012 02:23:46 PM > NSR, LBBB   Notes: No angina. Can Go back to cardiac rehab if she prefers. She goes to her fitness center with a trainer. Had bleeding issue with hip  surgery, received 4 U PRBC transfusion.  Stop aspirin. Continue Plavix.    2. Hyperlipemia, mixed  Continue Crestor Tablet, 10 MG, TAKE 1/2 TABLET TWICE A WEEK Continue Zetia Tablet, 10 MG, TAKE 1 TABLET ONCE DAILY. Notes: LDL 83. LDL 78 in 8/14   3. MI, Old  Notes: No CHF.    4.: Hypertension- controlled. Continue current medicines. Preventive Medicine  Adult topics discussed:  Diet: healthy diet.  Exercise: 5 days a week, at least 30 minutes of aerobic exercise.         Signed, Mina Marble, MD, [facility] 07/27/2013 10:24 AM

## 2013-07-27 NOTE — Patient Instructions (Addendum)
Your physician has recommended you make the following change in your medication:   1. Stop Aspirin.  Your physician wants you to follow-up in: 1 YEAR with Dr. Irish Lack. You will receive a reminder letter in the mail two months in advance. If you don't receive a letter, please call our office to schedule the follow-up appointment.

## 2013-07-30 ENCOUNTER — Ambulatory Visit: Payer: Medicare Other | Admitting: Interventional Cardiology

## 2013-08-22 DIAGNOSIS — L821 Other seborrheic keratosis: Secondary | ICD-10-CM | POA: Diagnosis not present

## 2013-08-22 DIAGNOSIS — L819 Disorder of pigmentation, unspecified: Secondary | ICD-10-CM | POA: Diagnosis not present

## 2013-08-22 DIAGNOSIS — D1801 Hemangioma of skin and subcutaneous tissue: Secondary | ICD-10-CM | POA: Diagnosis not present

## 2013-08-22 DIAGNOSIS — L659 Nonscarring hair loss, unspecified: Secondary | ICD-10-CM | POA: Diagnosis not present

## 2013-09-01 ENCOUNTER — Other Ambulatory Visit: Payer: Self-pay | Admitting: Interventional Cardiology

## 2013-09-05 ENCOUNTER — Ambulatory Visit (INDEPENDENT_AMBULATORY_CARE_PROVIDER_SITE_OTHER): Payer: Medicare Other | Admitting: Cardiology

## 2013-09-05 ENCOUNTER — Encounter: Payer: Self-pay | Admitting: Cardiology

## 2013-09-05 VITALS — BP 132/78 | HR 60 | Ht 64.0 in | Wt 208.0 lb

## 2013-09-05 DIAGNOSIS — E669 Obesity, unspecified: Secondary | ICD-10-CM

## 2013-09-05 DIAGNOSIS — Z9989 Dependence on other enabling machines and devices: Principal | ICD-10-CM

## 2013-09-05 DIAGNOSIS — I10 Essential (primary) hypertension: Secondary | ICD-10-CM

## 2013-09-05 DIAGNOSIS — I2510 Atherosclerotic heart disease of native coronary artery without angina pectoris: Secondary | ICD-10-CM | POA: Diagnosis not present

## 2013-09-05 DIAGNOSIS — G4733 Obstructive sleep apnea (adult) (pediatric): Secondary | ICD-10-CM | POA: Diagnosis not present

## 2013-09-05 NOTE — Progress Notes (Signed)
[place], [address] [place], [place]  [address] Phone: [phone] Fax:  [phone]  Date:  09/05/2013   ID:  [name], DOB 1929/05/08, MRN [id number]  PCP:  Henrine Screws, MD  Sleep Medicine:  Fransico Him, MD    History of Present Illness: [name] is a 80 y.o. female with a history of OSA, obesity and HTN. She is doing well. She is tolerating her CPAP well. She tolerates her nasal mask without any problems. She feels the current pressure setting is adequate. She feels refreshed when she gets up and has no daytime sleepiness. She Tai Chi and water aerobics. for exercise.    Wt Readings from Last 3 Encounters:  09/05/13 208 lb (94.348 kg)  07/27/13 206 lb (93.441 kg)  02/26/13 205 lb (92.987 kg)     Past Medical History  Diagnosis Date  . Hypertension   . Myocardial infarction   . Postop Acute blood loss anemia   . Postop Hyponatremia   . Postop Transfusion history   . Breast cancer   . Rhinitis   . Osteopenia   . Rotator cuff syndrome   . Hypercholesteremia   . Hyperlipemia   . Anemia   . GERD (gastroesophageal reflux disease)   . Hemorrhoids   . Colon polyps   . Macular degeneration   . Umbilical hernia   . Osteopenia   . Obstructive sleep apnea on CPAP   . Ulnar neuropathy   . Coronary artery disease 06/2006    STEMI inferior with BMS to mid RCA with mid basal inferior wall HK and luminal irrgularities elsewhere    Current Outpatient Prescriptions  Medication Sig Dispense Refill  . cholecalciferol (VITAMIN D) 1000 UNITS tablet Take 1,000 Units by mouth at bedtime.      . clopidogrel (PLAVIX) 75 MG tablet Take 75 mg by mouth daily.       . Coenzyme Q10 (COQ10) 200 MG CAPS Take 1 tablet by mouth daily.      . CRESTOR 10 MG tablet TAKE 1/2 TABLET TWICE A WEEK  12 tablet  3  . lisinopril (PRINIVIL,ZESTRIL) 20 MG tablet TAKE (1) TABLET DAILY.  30 tablet  2  . Multiple Vitamins-Minerals (PRESERVISION/LUTEIN) CAPS Take 1 capsule by  mouth 2 (two) times daily.      . nebivolol (BYSTOLIC) 5 MG tablet Take 5 mg by mouth daily.      Marland Kitchen NITROSTAT 0.4 MG SL tablet       . ZETIA 10 MG tablet TAKE 1 TABLET ONCE DAILY.  30 tablet  5   No current facility-administered medications for this visit.    Allergies:    Allergies  Allergen Reactions  . Chlorpheniramine Anaphylaxis  . Codeine Anaphylaxis    Social History:  The patient  reports that she has never smoked. She does not have any smokeless tobacco history on file. She reports that she drinks alcohol.   Family History:  The patient's family history includes CVA in her sister; Heart attack in her father.   ROS:  Please see the history of present illness.      All other systems reviewed and negative.   PHYSICAL EXAM: VS:  BP 132/78  Pulse 60  Ht $R'5\' 4"'GH$  (1.626 m)  Wt 208 lb (94.348 kg)  BMI 35.69 kg/m2 Well nourished, well developed, in no acute distress HEENT: normal Neck: no JVD Cardiac:  normal S1, S2; RRR; no murmur Lungs:  clear to auscultation bilaterally, no wheezing, rhonchi  or rales Abd: soft, nontender, no hepatomegaly Ext: trace LLE edema Skin: warm and dry Neuro:  CNs 2-12 intact, no focal abnormalities noted  ASSESSMENT AND PLAN:  1.  Obstructive Sleep Apnea doing well with current settings. - will get CPAP download from DME  2. HTN - well controlled - continue Lisinopril and Bystolic  3. Obesity - she will continue her water aerobics and Tai Chi   Followup with me in 6 months       Signed, Fransico Him, MD 09/05/2013 10:21 AM

## 2013-09-05 NOTE — Patient Instructions (Signed)
Your physician recommends that you continue on your current medications as directed. Please refer to the Current Medication list given to you today.  Your physician wants you to follow-up in: 6 months with Dr Turner You will receive a reminder letter in the mail two months in advance. If you don't receive a letter, please call our office to schedule the follow-up appointment.  

## 2013-09-11 ENCOUNTER — Encounter: Payer: Self-pay | Admitting: Cardiology

## 2013-09-13 ENCOUNTER — Encounter: Payer: Self-pay | Admitting: General Surgery

## 2013-09-27 ENCOUNTER — Other Ambulatory Visit: Payer: Self-pay | Admitting: Cardiology

## 2013-10-18 ENCOUNTER — Other Ambulatory Visit (HOSPITAL_COMMUNITY): Payer: Self-pay | Admitting: Obstetrics

## 2013-10-18 DIAGNOSIS — M810 Age-related osteoporosis without current pathological fracture: Secondary | ICD-10-CM

## 2013-11-12 DIAGNOSIS — E559 Vitamin D deficiency, unspecified: Secondary | ICD-10-CM | POA: Diagnosis not present

## 2013-12-06 DIAGNOSIS — L570 Actinic keratosis: Secondary | ICD-10-CM | POA: Diagnosis not present

## 2013-12-06 DIAGNOSIS — I789 Disease of capillaries, unspecified: Secondary | ICD-10-CM | POA: Diagnosis not present

## 2013-12-08 ENCOUNTER — Encounter: Payer: Self-pay | Admitting: Cardiology

## 2013-12-20 ENCOUNTER — Ambulatory Visit (HOSPITAL_COMMUNITY)
Admission: RE | Admit: 2013-12-20 | Discharge: 2013-12-20 | Disposition: A | Discharge status: Home / Self Care | Payer: Medicare Other | Source: Ambulatory Visit | Attending: Obstetrics | Admitting: Obstetrics

## 2013-12-20 DIAGNOSIS — Z780 Asymptomatic menopausal state: Secondary | ICD-10-CM | POA: Diagnosis not present

## 2013-12-20 DIAGNOSIS — Z13820 Encounter for screening for osteoporosis: Secondary | ICD-10-CM | POA: Diagnosis not present

## 2013-12-20 DIAGNOSIS — M810 Age-related osteoporosis without current pathological fracture: Secondary | ICD-10-CM | POA: Diagnosis not present

## 2013-12-28 DIAGNOSIS — H3530 Unspecified macular degeneration: Secondary | ICD-10-CM | POA: Diagnosis not present

## 2013-12-28 DIAGNOSIS — Z961 Presence of intraocular lens: Secondary | ICD-10-CM | POA: Diagnosis not present

## 2013-12-28 DIAGNOSIS — H04129 Dry eye syndrome of unspecified lacrimal gland: Secondary | ICD-10-CM | POA: Diagnosis not present

## 2013-12-28 DIAGNOSIS — H43819 Vitreous degeneration, unspecified eye: Secondary | ICD-10-CM | POA: Diagnosis not present

## 2014-01-03 DIAGNOSIS — M810 Age-related osteoporosis without current pathological fracture: Secondary | ICD-10-CM | POA: Diagnosis not present

## 2014-01-14 ENCOUNTER — Other Ambulatory Visit: Payer: Self-pay | Admitting: Interventional Cardiology

## 2014-01-23 DIAGNOSIS — I2510 Atherosclerotic heart disease of native coronary artery without angina pectoris: Secondary | ICD-10-CM | POA: Diagnosis not present

## 2014-01-23 DIAGNOSIS — I10 Essential (primary) hypertension: Secondary | ICD-10-CM | POA: Diagnosis not present

## 2014-01-23 DIAGNOSIS — Z79899 Other long term (current) drug therapy: Secondary | ICD-10-CM | POA: Diagnosis not present

## 2014-01-23 DIAGNOSIS — L659 Nonscarring hair loss, unspecified: Secondary | ICD-10-CM | POA: Diagnosis not present

## 2014-01-23 DIAGNOSIS — Z23 Encounter for immunization: Secondary | ICD-10-CM | POA: Diagnosis not present

## 2014-01-23 DIAGNOSIS — Z0000 Encounter for general adult medical examination without abnormal findings: Secondary | ICD-10-CM | POA: Diagnosis not present

## 2014-01-23 DIAGNOSIS — G4733 Obstructive sleep apnea (adult) (pediatric): Secondary | ICD-10-CM | POA: Diagnosis not present

## 2014-01-23 DIAGNOSIS — M810 Age-related osteoporosis without current pathological fracture: Secondary | ICD-10-CM | POA: Diagnosis not present

## 2014-01-23 DIAGNOSIS — E782 Mixed hyperlipidemia: Secondary | ICD-10-CM | POA: Diagnosis not present

## 2014-01-23 DIAGNOSIS — Z1331 Encounter for screening for depression: Secondary | ICD-10-CM | POA: Diagnosis not present

## 2014-02-03 ENCOUNTER — Other Ambulatory Visit: Payer: Self-pay | Admitting: Interventional Cardiology

## 2014-02-22 ENCOUNTER — Encounter (HOSPITAL_COMMUNITY): Payer: Self-pay | Admitting: Emergency Medicine

## 2014-02-22 ENCOUNTER — Emergency Department (HOSPITAL_COMMUNITY): Payer: Medicare Other

## 2014-02-22 ENCOUNTER — Inpatient Hospital Stay (HOSPITAL_COMMUNITY)
Admission: EM | Admit: 2014-02-22 | Discharge: 2014-02-26 | DRG: 308 | Disposition: A | Discharge status: Home / Self Care | Payer: Medicare Other | Attending: Internal Medicine | Admitting: Internal Medicine

## 2014-02-22 DIAGNOSIS — S199XXA Unspecified injury of neck, initial encounter: Secondary | ICD-10-CM | POA: Diagnosis not present

## 2014-02-22 DIAGNOSIS — E785 Hyperlipidemia, unspecified: Secondary | ICD-10-CM | POA: Diagnosis present

## 2014-02-22 DIAGNOSIS — S066X0A Traumatic subarachnoid hemorrhage without loss of consciousness, initial encounter: Secondary | ICD-10-CM | POA: Diagnosis not present

## 2014-02-22 DIAGNOSIS — R918 Other nonspecific abnormal finding of lung field: Secondary | ICD-10-CM | POA: Diagnosis not present

## 2014-02-22 DIAGNOSIS — S298XXA Other specified injuries of thorax, initial encounter: Secondary | ICD-10-CM | POA: Diagnosis not present

## 2014-02-22 DIAGNOSIS — R0602 Shortness of breath: Secondary | ICD-10-CM | POA: Diagnosis not present

## 2014-02-22 DIAGNOSIS — I441 Atrioventricular block, second degree: Principal | ICD-10-CM | POA: Diagnosis present

## 2014-02-22 DIAGNOSIS — S066X9A Traumatic subarachnoid hemorrhage with loss of consciousness of unspecified duration, initial encounter: Secondary | ICD-10-CM | POA: Diagnosis present

## 2014-02-22 DIAGNOSIS — I447 Left bundle-branch block, unspecified: Secondary | ICD-10-CM | POA: Diagnosis present

## 2014-02-22 DIAGNOSIS — Z853 Personal history of malignant neoplasm of breast: Secondary | ICD-10-CM

## 2014-02-22 DIAGNOSIS — Z6841 Body Mass Index (BMI) 40.0 and over, adult: Secondary | ICD-10-CM

## 2014-02-22 DIAGNOSIS — R55 Syncope and collapse: Secondary | ICD-10-CM | POA: Diagnosis present

## 2014-02-22 DIAGNOSIS — I2510 Atherosclerotic heart disease of native coronary artery without angina pectoris: Secondary | ICD-10-CM | POA: Diagnosis present

## 2014-02-22 DIAGNOSIS — W19XXXA Unspecified fall, initial encounter: Secondary | ICD-10-CM | POA: Diagnosis present

## 2014-02-22 DIAGNOSIS — I459 Conduction disorder, unspecified: Secondary | ICD-10-CM | POA: Diagnosis present

## 2014-02-22 DIAGNOSIS — H3530 Unspecified macular degeneration: Secondary | ICD-10-CM | POA: Diagnosis present

## 2014-02-22 DIAGNOSIS — I252 Old myocardial infarction: Secondary | ICD-10-CM | POA: Diagnosis not present

## 2014-02-22 DIAGNOSIS — Z955 Presence of coronary angioplasty implant and graft: Secondary | ICD-10-CM | POA: Diagnosis not present

## 2014-02-22 DIAGNOSIS — G4733 Obstructive sleep apnea (adult) (pediatric): Secondary | ICD-10-CM | POA: Diagnosis present

## 2014-02-22 DIAGNOSIS — I160 Hypertensive urgency: Secondary | ICD-10-CM

## 2014-02-22 DIAGNOSIS — I609 Nontraumatic subarachnoid hemorrhage, unspecified: Secondary | ICD-10-CM

## 2014-02-22 DIAGNOSIS — I4581 Long QT syndrome: Secondary | ICD-10-CM | POA: Diagnosis not present

## 2014-02-22 DIAGNOSIS — I618 Other nontraumatic intracerebral hemorrhage: Secondary | ICD-10-CM | POA: Diagnosis not present

## 2014-02-22 DIAGNOSIS — Z45018 Encounter for adjustment and management of other part of cardiac pacemaker: Secondary | ICD-10-CM | POA: Diagnosis not present

## 2014-02-22 DIAGNOSIS — Z8249 Family history of ischemic heart disease and other diseases of the circulatory system: Secondary | ICD-10-CM

## 2014-02-22 DIAGNOSIS — Z823 Family history of stroke: Secondary | ICD-10-CM

## 2014-02-22 DIAGNOSIS — K219 Gastro-esophageal reflux disease without esophagitis: Secondary | ICD-10-CM | POA: Diagnosis present

## 2014-02-22 DIAGNOSIS — Z96649 Presence of unspecified artificial hip joint: Secondary | ICD-10-CM | POA: Diagnosis present

## 2014-02-22 DIAGNOSIS — I619 Nontraumatic intracerebral hemorrhage, unspecified: Secondary | ICD-10-CM

## 2014-02-22 DIAGNOSIS — E780 Pure hypercholesterolemia: Secondary | ICD-10-CM | POA: Diagnosis present

## 2014-02-22 DIAGNOSIS — R9431 Abnormal electrocardiogram [ECG] [EKG]: Secondary | ICD-10-CM

## 2014-02-22 DIAGNOSIS — I10 Essential (primary) hypertension: Secondary | ICD-10-CM | POA: Diagnosis present

## 2014-02-22 DIAGNOSIS — I498 Other specified cardiac arrhythmias: Secondary | ICD-10-CM | POA: Diagnosis not present

## 2014-02-22 DIAGNOSIS — R6889 Other general symptoms and signs: Secondary | ICD-10-CM | POA: Diagnosis not present

## 2014-02-22 HISTORY — DX: Nontraumatic subarachnoid hemorrhage, unspecified: I60.9

## 2014-02-22 HISTORY — DX: Conduction disorder, unspecified: I45.9

## 2014-02-22 LAB — BASIC METABOLIC PANEL
Anion gap: 16 — ABNORMAL HIGH (ref 5–15)
BUN: 12 mg/dL (ref 6–23)
CO2: 23 mEq/L (ref 19–32)
CREATININE: 0.59 mg/dL (ref 0.50–1.10)
Calcium: 9 mg/dL (ref 8.4–10.5)
Chloride: 92 mEq/L — ABNORMAL LOW (ref 96–112)
GFR calc Af Amer: >90 mL/min (ref >90)
GFR, EST NON AFRICAN AMERICAN: 83 mL/min — AB (ref >90)
Glucose, Bld: 123 mg/dL — ABNORMAL HIGH (ref 70–99)
POTASSIUM: 4 meq/L (ref 3.7–5.3)
Sodium: 131 mEq/L — ABNORMAL LOW (ref 137–147)

## 2014-02-22 LAB — URINALYSIS, ROUTINE W REFLEX MICROSCOPIC
BILIRUBIN URINE: NEGATIVE
Glucose, UA: NEGATIVE mg/dL
HGB URINE DIPSTICK: NEGATIVE
KETONES UR: 15 mg/dL — AB
Leukocytes, UA: NEGATIVE
NITRITE: NEGATIVE
PH: 7.5 (ref 5.0–8.0)
Protein, ur: 30 mg/dL — AB
Specific Gravity, Urine: 1.01 (ref 1.005–1.030)
Urobilinogen, UA: 0.2 mg/dL (ref 0.0–1.0)

## 2014-02-22 LAB — PROTIME-INR
INR: 0.98 (ref 0.00–1.49)
PROTHROMBIN TIME: 13 s (ref 11.6–15.2)

## 2014-02-22 LAB — CBC WITH DIFFERENTIAL/PLATELET
BASOS PCT: 0 % (ref 0–1)
Basophils Absolute: 0 10*3/uL (ref 0.0–0.1)
Eosinophils Absolute: 0.1 10*3/uL (ref 0.0–0.7)
Eosinophils Relative: 1 % (ref 0–5)
HCT: 40.7 % (ref 36.0–46.0)
Hemoglobin: 13.9 g/dL (ref 12.0–15.0)
Lymphocytes Relative: 10 % — ABNORMAL LOW (ref 12–46)
Lymphs Abs: 1.1 10*3/uL (ref 0.7–4.0)
MCH: 30.3 pg (ref 26.0–34.0)
MCHC: 34.2 g/dL (ref 30.0–36.0)
MCV: 88.7 fL (ref 78.0–100.0)
MONOS PCT: 9 % (ref 3–12)
Monocytes Absolute: 1 10*3/uL (ref 0.1–1.0)
NEUTROS ABS: 8.8 10*3/uL — AB (ref 1.7–7.7)
NEUTROS PCT: 80 % — AB (ref 43–77)
Platelets: 221 10*3/uL (ref 150–400)
RBC: 4.59 MIL/uL (ref 3.87–5.11)
RDW: 12.6 % (ref 11.5–15.5)
WBC: 11 10*3/uL — ABNORMAL HIGH (ref 4.0–10.5)

## 2014-02-22 LAB — URINE MICROSCOPIC-ADD ON

## 2014-02-22 LAB — APTT: aPTT: 33 seconds (ref 24–37)

## 2014-02-22 MED ORDER — NICARDIPINE HCL IN NACL 20-0.86 MG/200ML-% IV SOLN
3.0000 mg/h | Freq: Once | INTRAVENOUS | Status: AC
  Administered 2014-02-22: 3 mg/h via INTRAVENOUS
  Filled 2014-02-22: qty 200

## 2014-02-22 MED ORDER — SODIUM CHLORIDE 0.9 % IV SOLN
Freq: Once | INTRAVENOUS | Status: AC
  Administered 2014-02-22: 23:00:00 via INTRAVENOUS

## 2014-02-22 MED ORDER — SODIUM CHLORIDE 0.9 % IV BOLUS (SEPSIS)
500.0000 mL | Freq: Once | INTRAVENOUS | Status: AC
  Administered 2014-02-22: 500 mL via INTRAVENOUS

## 2014-02-22 MED ORDER — ATROPINE SULFATE 0.4 MG/ML IJ SOLN
0.4000 mg | Freq: Once | INTRAMUSCULAR | Status: AC
  Administered 2014-02-22: 0.4 mg via INTRAVENOUS
  Filled 2014-02-22: qty 1

## 2014-02-22 MED ORDER — METOCLOPRAMIDE HCL 5 MG/ML IJ SOLN
10.0000 mg | Freq: Once | INTRAMUSCULAR | Status: AC
  Administered 2014-02-22: 10 mg via INTRAVENOUS
  Filled 2014-02-22: qty 2

## 2014-02-22 NOTE — ED Notes (Signed)
Pts HR decreased to 38 beats per minute, Dr. Mingo Amber and Kathi Simpers informed. New EKG captured and hand delivered to MD [facility]. Pt HR fluctuating between 30's to 70's. Pt A&Ox4. Pt reporting nausea, pt mildly diaphoretic. Pt to have repeat CT scan.

## 2014-02-22 NOTE — ED Notes (Signed)
Patient had a syncopal episode tonight and fell in the hallway of her home. Husband found her 5 minutes later. Patient was confused and not completely alert when found. Patient has pain when moving left arm. Denies neck pain. Denies hitting head but has headache behind her left eye. Patient states she does not remember 5 hours of the day today. Patient had some repetitive questioning with EMS. Patient does take blood thinner.

## 2014-02-22 NOTE — ED Notes (Signed)
Patient placed on zoll for transport to CT

## 2014-02-22 NOTE — Consult Note (Signed)
Cardiology Consult Note [name],[name], MD No ref. provider found Cardiologist: Dr. Eldridge Dace  Reason for consult: bradycardia, s/p syncopal event  History of Present Illness (and review of medical records): [name] is a 80 y.o. female who presents for evaluation after syncopal event at home.  She has hx of CAD, s/p STEMI in 2008 with BMS to mid RCA, HTN, HLD, and OSA on CPAP.  She was in her usual state of health until she was found down at home by her husband.  She was sitting down watching television until her husband called her for dinner.  As she did not come, he went to check on her and found her on her back.  She does not remember what happened.  She does not recall any symptoms leading up to event.  She was reportedly confused as to what happened after the event per family members.  She has not had any prior episodes of syncope.  She currently denies any chest pain or shortness of breath.  She was found on CT to have small SAH and has been evaluated by neurosurgery and neurology services.  Cardiology was consulted for assistance with bradycardia in the ED.  Review of Systems A comprehensive review of systems was negative except for: Gastrointestinal: positive for nausea Musculoskeletal: positive for back pain Neurological: positive for headache  Patient Active Problem List   Diagnosis Date Noted  . Subarachnoid hemorrhage following injury with brief loss of consciousness but without open intracranial wound 02/22/2014  . Syncope and collapse 02/22/2014  . Hypertensive urgency 02/22/2014  . SAH (subarachnoid hemorrhage) 02/22/2014  . Coronary atherosclerosis of native coronary artery 07/27/2013  . Obstructive sleep apnea on CPAP 02/26/2013  . Obesity (BMI 30-39.9) 02/26/2013  . Myocardial infarction   . Rhinitis   . Osteopenia   . Rotator cuff syndrome   . Hypercholesteremia   . Hyperlipemia   . Anemia   . GERD (gastroesophageal reflux disease)   . Breast cancer  08/05/2011  . Fracture of femoral neck, right 06/13/2011  . Fracture of humeral head, right, closed 06/13/2011  . HTN (hypertension) 06/13/2011  . Postop Acute blood loss anemia 06/07/2011  . Postop Hyponatremia 06/07/2011  . Postop Transfusion history 06/07/2011  . Coronary artery disease 06/24/2006   Past Medical History  Diagnosis Date  . Hypertension   . Myocardial infarction   . Postop Acute blood loss anemia   . Postop Hyponatremia   . Postop Transfusion history   . Breast cancer   . Rhinitis   . Osteopenia   . Rotator cuff syndrome   . Hypercholesteremia   . Hyperlipemia   . Anemia   . GERD (gastroesophageal reflux disease)   . Hemorrhoids   . Colon polyps   . Macular degeneration   . Umbilical hernia   . Osteopenia   . Obstructive sleep apnea on CPAP   . Ulnar neuropathy   . Coronary artery disease 06/2006    STEMI inferior with BMS to mid RCA with mid basal inferior wall HK and luminal irrgularities elsewhere    Past Surgical History  Procedure Laterality Date  . Shoulder surgery    . Breast lumpectomy    . Wrist surgery    . Hip arthroplasty  06/06/2011    Procedure: ARTHROPLASTY BIPOLAR HIP;  Surgeon: Loanne Drilling;  Location: [facility]S;  Service: Orthopedics;  Laterality: Right;  . Coronary angioplasty with stent placement  06/2006    Mid RCA BMS    No current  facility-administered medications for this encounter.   Current Outpatient Prescriptions  Medication Sig Dispense Refill  . cholecalciferol (VITAMIN D) 1000 UNITS tablet Take 1,000 Units by mouth at bedtime.      . clopidogrel (PLAVIX) 75 MG tablet Take 75 mg by mouth daily.       . Coenzyme Q10 (COQ10) 200 MG CAPS Take 1 tablet by mouth daily.      Marland Kitchen ezetimibe (ZETIA) 10 MG tablet Take 10 mg by mouth 2 (two) times a week. Mondays and fridays      . lisinopril (PRINIVIL,ZESTRIL) 20 MG tablet Take 20 mg by mouth daily.      . Multiple Vitamins-Minerals (PRESERVISION/LUTEIN) CAPS Take 1 capsule by  mouth 2 (two) times daily.      . nebivolol (BYSTOLIC) 5 MG tablet Take 5 mg by mouth daily.      . nitroGLYCERIN (NITROSTAT) 0.4 MG SL tablet Place 0.4 mg under the tongue every 5 (five) minutes as needed for chest pain.      . rosuvastatin (CRESTOR) 5 MG tablet Take 5 mg by mouth 2 (two) times a week. Tuesday and thursdays        Allergies  Allergen Reactions  . Chlorpheniramine Anaphylaxis  . Codeine Anaphylaxis    History  Substance Use Topics  . Smoking status: Never Smoker   . Smokeless tobacco: Not on file  . Alcohol Use: Yes     Comment: occasional wine    Family History  Problem Relation Age of Onset  . Heart attack Father   . CVA Sister      Objective:  Patient Vitals for the past 8 hrs:  BP Temp Temp src Pulse Resp SpO2  02/22/14 2328 132/40 mmHg - - - 12 99 %  02/22/14 2319 144/45 mmHg - - - - -  02/22/14 2313 136/37 mmHg - - 87 12 99 %  02/22/14 2311 86/58 mmHg - - 87 27 98 %  02/22/14 2303 153/60 mmHg - - 30 18 97 %  02/22/14 2253 187/44 mmHg - - 51 22 100 %  02/22/14 2250 187/44 mmHg - - - 16 -  02/22/14 2217 - - - 38 - -  02/22/14 2215 202/65 mmHg - - 70 15 99 %  02/22/14 2154 203/49 mmHg - - 60 20 100 %  02/22/14 2139 203/83 mmHg - - 74 18 100 %  02/22/14 2057 197/74 mmHg - - 68 20 100 %  02/22/14 1940 204/72 mmHg 97.9 F (36.6 C) Oral 78 15 100 %   General appearance: alert, cooperative, appears stated age, obese female Head: Normocephalic, without obvious abnormality, Eyes: conjunctivae/corneas clear. PERRL, EOM's intact.  Neck: no carotid bruit, and supple,  Lungs: clear to auscultation bilaterally Chest wall: no tenderness Heart: bradycardic, S1, S2 normal,  Abdomen: soft, non-tender; bowel sounds normal;  Extremities: 2+ BLE edema Pulses: 2+ and symmetric Neurologic: Grossly normal  Results for orders placed during the hospital encounter of 02/22/14 (from the past 48 hour(s))  URINALYSIS, ROUTINE W REFLEX MICROSCOPIC     Status: Abnormal    Collection Time    02/22/14  7:39 PM      Result Value Ref Range   Color, Urine YELLOW  YELLOW   APPearance CLOUDY (*) CLEAR   Specific Gravity, Urine 1.010  1.005 - 1.030   pH 7.5  5.0 - 8.0   Glucose, UA NEGATIVE  NEGATIVE mg/dL   Hgb urine dipstick NEGATIVE  NEGATIVE   Bilirubin Urine NEGATIVE  NEGATIVE  Ketones, ur 15 (*) NEGATIVE mg/dL   Protein, ur 30 (*) NEGATIVE mg/dL   Urobilinogen, UA 0.2  0.0 - 1.0 mg/dL   Nitrite NEGATIVE  NEGATIVE   Leukocytes, UA NEGATIVE  NEGATIVE  URINE MICROSCOPIC-ADD ON     Status: Abnormal   Collection Time    02/22/14  7:39 PM      Result Value Ref Range   Squamous Epithelial / LPF FEW (*) RARE   WBC, UA 0-2  <3 WBC/hpf   RBC / HPF 0-2  <3 RBC/hpf   Bacteria, UA FEW (*) RARE   Urine-Other AMORPHOUS URATES/PHOSPHATES    CBC WITH DIFFERENTIAL     Status: Abnormal   Collection Time    02/22/14  8:51 PM      Result Value Ref Range   WBC 11.0 (*) 4.0 - 10.5 K/uL   RBC 4.59  3.87 - 5.11 MIL/uL   Hemoglobin 13.9  12.0 - 15.0 g/dL   HCT 08.9  10.0 - 26.2 %   MCV 88.7  78.0 - 100.0 fL   MCH 30.3  26.0 - 34.0 pg   MCHC 34.2  30.0 - 36.0 g/dL   RDW 85.4  96.5 - 65.9 %   Platelets 221  150 - 400 K/uL   Neutrophils Relative % 80 (*) 43 - 77 %   Neutro Abs 8.8 (*) 1.7 - 7.7 K/uL   Lymphocytes Relative 10 (*) 12 - 46 %   Lymphs Abs 1.1  0.7 - 4.0 K/uL   Monocytes Relative 9  3 - 12 %   Monocytes Absolute 1.0  0.1 - 1.0 K/uL   Eosinophils Relative 1  0 - 5 %   Eosinophils Absolute 0.1  0.0 - 0.7 K/uL   Basophils Relative 0  0 - 1 %   Basophils Absolute 0.0  0.0 - 0.1 K/uL  BASIC METABOLIC PANEL     Status: Abnormal   Collection Time    02/22/14  8:51 PM      Result Value Ref Range   Sodium 131 (*) 137 - 147 mEq/L   Potassium 4.0  3.7 - 5.3 mEq/L   Chloride 92 (*) 96 - 112 mEq/L   CO2 23  19 - 32 mEq/L   Glucose, Bld 123 (*) 70 - 99 mg/dL   BUN 12  6 - 23 mg/dL   Creatinine, Ser 9.43  0.50 - 1.10 mg/dL   Calcium 9.0  8.4 - 71.9 mg/dL    GFR calc non Af Amer 83 (*) >90 mL/min   GFR calc Af Amer >90  >90 mL/min   Comment: (NOTE)     The eGFR has been calculated using the CKD EPI equation.     This calculation has not been validated in all clinical situations.     eGFR's persistently <90 mL/min signify possible Chronic Kidney     Disease.   Anion gap 16 (*) 5 - 15  APTT     Status: None   Collection Time    02/22/14  8:51 PM      Result Value Ref Range   aPTT 33  24 - 37 seconds  PROTIME-INR     Status: None   Collection Time    02/22/14  8:51 PM      Result Value Ref Range   Prothrombin Time 13.0  11.6 - 15.2 seconds   INR 0.98  0.00 - 1.49   Dg Chest 2 View  02/22/2014   CLINICAL DATA:  Loss  of consciousness, fall today. Acute shortness of breath. Back pain.  EXAM: CHEST  2 VIEW  COMPARISON:  06/05/2011 and 01/30/2007.  FINDINGS: Trachea is midline. Heart is at the upper limits of normal in size. Biapical pleural thickening. Lungs are clear. No pleural fluid. Flowing anterior osteophytosis in the thoracic spine. Degenerative changes in the right shoulder. Possible resection of the distal left clavicle.  IMPRESSION: No acute findings.   Electronically Signed   By: Lorin Picket M.D.   On: 02/22/2014 21:43   Ct Head Wo Contrast  02/22/2014   CLINICAL DATA:  Subsequent encounter, subarachnoid hemorrhage, on anticoagulation. New bradycardia.  EXAM: CT HEAD WITHOUT CONTRAST  TECHNIQUE: Contiguous axial images were obtained from the base of the skull through the vertex without intravenous contrast.  COMPARISON:  02/22/2014 at 2019 hr.  FINDINGS: Small amount of hyper attenuation is seen in the sulci along the medial right frontal lobe, unchanged. Question tiny amount of high attenuation along the sulci of the anterior right parietal lobe (image 19). No evidence of an acute infarct, mass lesion, mass effect or hydrocephalus. Atrophy. Periventricular low attenuation. No air-fluid levels in the visualized portions of the  paranasal sinuses and mastoid air cells.  IMPRESSION: 1. Acute subarachnoid hemorrhage along the medial right frontal lobe is stable from exam performed at 2019 hr the same day. Question tiny new focus of subarachnoid hemorrhage along the right parietal lobe. 2. Atrophy and chronic microvascular white matter ischemic changes.   Electronically Signed   By: Lorin Picket M.D.   On: 02/22/2014 22:47   Ct Head Wo Contrast  02/22/2014   CLINICAL DATA:  Fall with loss of consciousness.  Evaluate for CVA.  EXAM: CT HEAD WITHOUT CONTRAST  CT NECK WITHOUT CONTRAST  TECHNIQUE: Contiguous axial images were obtained from the base of the skull through the vertex without contrast. Multidetector CT imaging of the neck was performed using the standard protocol without intravenous contrast.  COMPARISON:  None.  FINDINGS: CT HEAD FINDINGS  Ventricles and sulci are appropriate for patient's age. There is a small amount of acute subarachnoid hemorrhage within the right frontal lobe (image 20; series 2). No mass effect. No mass lesion. Bilateral cataract surgery. Mild mucosal thickening of the ethmoid air cells and maxillary sinuses. Calvarium is intact.  Extensive bilateral subcortical and deep white matter hypodensities compatible with chronic small vessel ischemic change. Bilateral basal ganglia lacunar infarcts.  CT NECK FINDINGS  Visualized thyroid is unremarkable. Lung apices are clear. Multilevel degenerative disc and facet disease. Multilevel anterior endplate osteophytosis. Normal anatomic alignment of the cervical spine. Prevertebral soft tissues unremarkable. Cranial cervical junction is intact.  IMPRESSION: 1. Small amount of subarachnoid hemorrhage within the right frontal lobe. 2. Multilevel degenerative change of the cervical spine without evidence for acute fracture. Critical Value/emergent results were called by telephone at the time of interpretation on 02/22/2014 at 9:07 pm to Dr. Evelina Bucy, who verbally  acknowledged these results.   Electronically Signed   By: Lovey Newcomer M.D.   On: 02/22/2014 21:13   Ct Cervical Spine Wo Contrast  02/22/2014   CLINICAL DATA:  Fall with loss of consciousness.  Evaluate for CVA.  EXAM: CT HEAD WITHOUT CONTRAST  CT NECK WITHOUT CONTRAST  TECHNIQUE: Contiguous axial images were obtained from the base of the skull through the vertex without contrast. Multidetector CT imaging of the neck was performed using the standard protocol without intravenous contrast.  COMPARISON:  None.  FINDINGS: CT HEAD FINDINGS  Ventricles  and sulci are appropriate for patient's age. There is a small amount of acute subarachnoid hemorrhage within the right frontal lobe (image 20; series 2). No mass effect. No mass lesion. Bilateral cataract surgery. Mild mucosal thickening of the ethmoid air cells and maxillary sinuses. Calvarium is intact.  Extensive bilateral subcortical and deep white matter hypodensities compatible with chronic small vessel ischemic change. Bilateral basal ganglia lacunar infarcts.  CT NECK FINDINGS  Visualized thyroid is unremarkable. Lung apices are clear. Multilevel degenerative disc and facet disease. Multilevel anterior endplate osteophytosis. Normal anatomic alignment of the cervical spine. Prevertebral soft tissues unremarkable. Cranial cervical junction is intact.  IMPRESSION: 1. Small amount of subarachnoid hemorrhage within the right frontal lobe. 2. Multilevel degenerative change of the cervical spine without evidence for acute fracture. Critical Value/emergent results were called by telephone at the time of interpretation on 02/22/2014 at 9:07 pm to Dr. Evelina Bucy, who verbally acknowledged these results.   Electronically Signed   By: Lovey Newcomer M.D.   On: 02/22/2014 21:13    ECG:  02-22-14 830pm sinus rhythm HR 69 BPM, LBBB, similar to prior ekgs.  02-22-14 1011pm sinus brady HR 57, 2:1 AVB, PACs, LBBB, new change, abnormal ecg  02-22-14 1013p, sinus brady HR 38, 2:1  AVB, PACs, LBBB, abnormal ecg  Telemetry now sinus brady with PACs, LBBB  Impression: Syncope, unclear etiology, possible underlying sinus node dysfunction Sinus bradycardia with intermittent 2:1 AVB LBBB, chronic Small Subarachnoid hemorrhage, right frontal lobe HTN OSA on CPAP HLD  Patiently currently hemodynamically stable.  No urgent indications for transvenous pacing. At this unclear is 2nd degree AVB was the cause of patient syncopal episode.  She does have baseline LBBB.  Will likely need to be closely monitored and evaluated by EP for possible permanent pacemaker.    Recommendations: Agree with admission for further evaluation and management in ICU Close monitoring on telemetry, EKG, rhythm strip with changes, Pacer pads on Hold bystolic Hold ASA and Plavix, no recent coronary interventions. Cycle cardiac enyzmes, r/o MI TTE in am, reassess LV function, valvular disorders  Thank you for this consult.  We will follow along and make further recommendations as indicated.

## 2014-02-22 NOTE — H&P (Signed)
Admission H&P    Chief Complaint: Headache following an episode of loss of consciousness.  HPI: [name] is an 80 y.o. female history of hypertension, coronary artery disease with stent placement, myocardial infarction, hyperlipidemia, and macular degeneration, brought to the emergency room following an episode of loss of consciousness. Patient does not recall any events immediately prior to losing consciousness. No evidence of trauma was seen. CT scan of her head showed a small area of intraparenchymal and subarachnoid hemorrhage involving the right parasagittal frontal area. Patient's also complaining of subscapular pain on the right. Blood pressure is elevated in the emergency room with systolic blood pressure up to 204. Heart rate has varied considerably with episodes of bradycardia down to the high 30s. She has no previous history of stroke or TIA. She's been taking Plavix 75 mg per day for antiplatelet therapy. NIH stroke score was 0.  LSN: 6 PM on 02/22/2014 tPA Given: No: Intracranial hemorrhage mRankin:  Past Medical History  Diagnosis Date  . Hypertension   . Myocardial infarction   . Postop Acute blood loss anemia   . Postop Hyponatremia   . Postop Transfusion history   . Breast cancer   . Rhinitis   . Osteopenia   . Rotator cuff syndrome   . Hypercholesteremia   . Hyperlipemia   . Anemia   . GERD (gastroesophageal reflux disease)   . Hemorrhoids   . Colon polyps   . Macular degeneration   . Umbilical hernia   . Osteopenia   . Obstructive sleep apnea on CPAP   . Ulnar neuropathy   . Coronary artery disease 06/2006    STEMI inferior with BMS to mid RCA with mid basal inferior wall HK and luminal irrgularities elsewhere    Past Surgical History  Procedure Laterality Date  . Shoulder surgery    . Breast lumpectomy    . Wrist surgery    . Hip arthroplasty  06/06/2011    Procedure: ARTHROPLASTY BIPOLAR HIP;  Surgeon: Gearlean Alf;  Location: [facility];   Service: Orthopedics;  Laterality: Right;  . Coronary angioplasty with stent placement  06/2006    Mid RCA BMS    Family History  Problem Relation Age of Onset  . Heart attack Father   . CVA Sister    Social History:  reports that she has never smoked. She does not have any smokeless tobacco history on file. She reports that she drinks alcohol. Her drug history is not on file.  Allergies:  Allergies  Allergen Reactions  . Chlorpheniramine Anaphylaxis  . Codeine Anaphylaxis    Medications: Patient's medications prior to admission were reviewed by me.  ROS: History obtained from the patient  General ROS: negative for - chills, fatigue, fever, night sweats, weight gain or weight loss Psychological ROS: negative for - behavioral disorder, hallucinations, memory difficulties, mood swings or suicidal ideation Ophthalmic ROS: negative for - blurry vision, double vision, eye pain or loss of vision ENT ROS: negative for - epistaxis, nasal discharge, oral lesions, sore throat, tinnitus or vertigo Allergy and Immunology ROS: negative for - hives or itchy/watery eyes Hematological and Lymphatic ROS: negative for - bleeding problems, bruising or swollen lymph nodes Endocrine ROS: negative for - galactorrhea, hair pattern changes, polydipsia/polyuria or temperature intolerance Respiratory ROS: negative for - cough, hemoptysis, shortness of breath or wheezing Cardiovascular ROS: negative for - chest pain, dyspnea on exertion, edema or irregular heartbeat Gastrointestinal ROS: negative for - abdominal pain, diarrhea, hematemesis, nausea/vomiting or stool incontinence  Genito-Urinary ROS: negative for - dysuria, hematuria, incontinence or urinary frequency/urgency Musculoskeletal ROS: negative for - joint swelling or muscular weakness Neurological ROS: as noted in HPI Dermatological ROS: negative for rash and skin lesion changes  Physical Examination: Blood pressure 187/44, pulse 51,  temperature 97.9 F (36.6 C), temperature source Oral, resp. rate 22, SpO2 100.00%.  HEENT-  Normocephalic, no lesions, without obvious abnormality.  Normal external eye and conjunctiva.  Normal TM's bilaterally.  Normal auditory canals and external ears. Normal external nose, mucus membranes and septum.  Normal pharynx. Neck supple with no masses, nodes, nodules or enlargement. Cardiovascular - regular rate and rhythm, S1, S2 normal, no murmur, click, rub or gallop Lungs - chest clear, no wheezing, rales, normal symmetric air entry, Heart exam - S1, S2 normal, no murmur, no gallop, rate regular Abdomen - soft, non-tender; bowel sounds normal; no masses,  no organomegaly Extremities - no joint deformities, effusion, or inflammation and no edema  Neurologic Examination: Mental Status: Alert, oriented, thought content appropriate.  Speech fluent without evidence of aphasia. Able to follow commands without difficulty. Cranial Nerves: II-Visual fields were normal. III/IV/VI-Pupils were equal and reacted. Extraocular movements were full and conjugate.    V/VII-no facial numbness and no facial weakness. VIII-normal. X-normal speech and symmetrical palatal movement. Motor: 5/5 bilaterally with normal tone and bulk Sensory: Normal throughout. Deep Tendon Reflexes: 1+ and symmetric. Plantars: Mute bilaterally Cerebellar: Normal finger-to-nose testing.  Results for orders placed during the hospital encounter of 02/22/14 (from the past 48 hour(s))  URINALYSIS, ROUTINE W REFLEX MICROSCOPIC     Status: Abnormal   Collection Time    02/22/14  7:39 PM      Result Value Ref Range   Color, Urine YELLOW  YELLOW   APPearance CLOUDY (*) CLEAR   Specific Gravity, Urine 1.010  1.005 - 1.030   pH 7.5  5.0 - 8.0   Glucose, UA NEGATIVE  NEGATIVE mg/dL   Hgb urine dipstick NEGATIVE  NEGATIVE   Bilirubin Urine NEGATIVE  NEGATIVE   Ketones, ur 15 (*) NEGATIVE mg/dL   Protein, ur 30 (*) NEGATIVE mg/dL    Urobilinogen, UA 0.2  0.0 - 1.0 mg/dL   Nitrite NEGATIVE  NEGATIVE   Leukocytes, UA NEGATIVE  NEGATIVE  URINE MICROSCOPIC-ADD ON     Status: Abnormal   Collection Time    02/22/14  7:39 PM      Result Value Ref Range   Squamous Epithelial / LPF FEW (*) RARE   WBC, UA 0-2  <3 WBC/hpf   RBC / HPF 0-2  <3 RBC/hpf   Bacteria, UA FEW (*) RARE   Urine-Other AMORPHOUS URATES/PHOSPHATES    CBC WITH DIFFERENTIAL     Status: Abnormal   Collection Time    02/22/14  8:51 PM      Result Value Ref Range   WBC 11.0 (*) 4.0 - 10.5 K/uL   RBC 4.59  3.87 - 5.11 MIL/uL   Hemoglobin 13.9  12.0 - 15.0 g/dL   HCT 40.7  36.0 - 46.0 %   MCV 88.7  78.0 - 100.0 fL   MCH 30.3  26.0 - 34.0 pg   MCHC 34.2  30.0 - 36.0 g/dL   RDW 12.6  11.5 - 15.5 %   Platelets 221  150 - 400 K/uL   Neutrophils Relative % 80 (*) 43 - 77 %   Neutro Abs 8.8 (*) 1.7 - 7.7 K/uL   Lymphocytes Relative 10 (*) 12 - 46 %  Lymphs Abs 1.1  0.7 - 4.0 K/uL   Monocytes Relative 9  3 - 12 %   Monocytes Absolute 1.0  0.1 - 1.0 K/uL   Eosinophils Relative 1  0 - 5 %   Eosinophils Absolute 0.1  0.0 - 0.7 K/uL   Basophils Relative 0  0 - 1 %   Basophils Absolute 0.0  0.0 - 0.1 K/uL  BASIC METABOLIC PANEL     Status: Abnormal   Collection Time    02/22/14  8:51 PM      Result Value Ref Range   Sodium 131 (*) 137 - 147 mEq/L   Potassium 4.0  3.7 - 5.3 mEq/L   Chloride 92 (*) 96 - 112 mEq/L   CO2 23  19 - 32 mEq/L   Glucose, Bld 123 (*) 70 - 99 mg/dL   BUN 12  6 - 23 mg/dL   Creatinine, Ser 3.79  0.50 - 1.10 mg/dL   Calcium 9.0  8.4 - 44.4 mg/dL   GFR calc non Af Amer 83 (*) >90 mL/min   GFR calc Af Amer >90  >90 mL/min   Comment: (NOTE)     The eGFR has been calculated using the CKD EPI equation.     This calculation has not been validated in all clinical situations.     eGFR's persistently <90 mL/min signify possible Chronic Kidney     Disease.   Anion gap 16 (*) 5 - 15  APTT     Status: None   Collection Time    02/22/14   8:51 PM      Result Value Ref Range   aPTT 33  24 - 37 seconds  PROTIME-INR     Status: None   Collection Time    02/22/14  8:51 PM      Result Value Ref Range   Prothrombin Time 13.0  11.6 - 15.2 seconds   INR 0.98  0.00 - 1.49   Dg Chest 2 View  02/22/2014   CLINICAL DATA:  Loss of consciousness, fall today. Acute shortness of breath. Back pain.  EXAM: CHEST  2 VIEW  COMPARISON:  06/05/2011 and 01/30/2007.  FINDINGS: Trachea is midline. Heart is at the upper limits of normal in size. Biapical pleural thickening. Lungs are clear. No pleural fluid. Flowing anterior osteophytosis in the thoracic spine. Degenerative changes in the right shoulder. Possible resection of the distal left clavicle.  IMPRESSION: No acute findings.   Electronically Signed   By: Leanna Battles M.D.   On: 02/22/2014 21:43   Ct Head Wo Contrast  02/22/2014   CLINICAL DATA:  Subsequent encounter, subarachnoid hemorrhage, on anticoagulation. New bradycardia.  EXAM: CT HEAD WITHOUT CONTRAST  TECHNIQUE: Contiguous axial images were obtained from the base of the skull through the vertex without intravenous contrast.  COMPARISON:  02/22/2014 at 2019 hr.  FINDINGS: Small amount of hyper attenuation is seen in the sulci along the medial right frontal lobe, unchanged. Question tiny amount of high attenuation along the sulci of the anterior right parietal lobe (image 19). No evidence of an acute infarct, mass lesion, mass effect or hydrocephalus. Atrophy. Periventricular low attenuation. No air-fluid levels in the visualized portions of the paranasal sinuses and mastoid air cells.  IMPRESSION: 1. Acute subarachnoid hemorrhage along the medial right frontal lobe is stable from exam performed at 2019 hr the same day. Question tiny new focus of subarachnoid hemorrhage along the right parietal lobe. 2. Atrophy and chronic microvascular white matter ischemic changes.  Electronically Signed   By: Lorin Picket M.D.   On: 02/22/2014 22:47    Ct Head Wo Contrast  02/22/2014   CLINICAL DATA:  Fall with loss of consciousness.  Evaluate for CVA.  EXAM: CT HEAD WITHOUT CONTRAST  CT NECK WITHOUT CONTRAST  TECHNIQUE: Contiguous axial images were obtained from the base of the skull through the vertex without contrast. Multidetector CT imaging of the neck was performed using the standard protocol without intravenous contrast.  COMPARISON:  None.  FINDINGS: CT HEAD FINDINGS  Ventricles and sulci are appropriate for patient's age. There is a small amount of acute subarachnoid hemorrhage within the right frontal lobe (image 20; series 2). No mass effect. No mass lesion. Bilateral cataract surgery. Mild mucosal thickening of the ethmoid air cells and maxillary sinuses. Calvarium is intact.  Extensive bilateral subcortical and deep white matter hypodensities compatible with chronic small vessel ischemic change. Bilateral basal ganglia lacunar infarcts.  CT NECK FINDINGS  Visualized thyroid is unremarkable. Lung apices are clear. Multilevel degenerative disc and facet disease. Multilevel anterior endplate osteophytosis. Normal anatomic alignment of the cervical spine. Prevertebral soft tissues unremarkable. Cranial cervical junction is intact.  IMPRESSION: 1. Small amount of subarachnoid hemorrhage within the right frontal lobe. 2. Multilevel degenerative change of the cervical spine without evidence for acute fracture. Critical Value/emergent results were called by telephone at the time of interpretation on 02/22/2014 at 9:07 pm to Dr. Evelina Bucy, who verbally acknowledged these results.   Electronically Signed   By: Lovey Newcomer M.D.   On: 02/22/2014 21:13   Ct Cervical Spine Wo Contrast  02/22/2014   CLINICAL DATA:  Fall with loss of consciousness.  Evaluate for CVA.  EXAM: CT HEAD WITHOUT CONTRAST  CT NECK WITHOUT CONTRAST  TECHNIQUE: Contiguous axial images were obtained from the base of the skull through the vertex without contrast. Multidetector CT  imaging of the neck was performed using the standard protocol without intravenous contrast.  COMPARISON:  None.  FINDINGS: CT HEAD FINDINGS  Ventricles and sulci are appropriate for patient's age. There is a small amount of acute subarachnoid hemorrhage within the right frontal lobe (image 20; series 2). No mass effect. No mass lesion. Bilateral cataract surgery. Mild mucosal thickening of the ethmoid air cells and maxillary sinuses. Calvarium is intact.  Extensive bilateral subcortical and deep white matter hypodensities compatible with chronic small vessel ischemic change. Bilateral basal ganglia lacunar infarcts.  CT NECK FINDINGS  Visualized thyroid is unremarkable. Lung apices are clear. Multilevel degenerative disc and facet disease. Multilevel anterior endplate osteophytosis. Normal anatomic alignment of the cervical spine. Prevertebral soft tissues unremarkable. Cranial cervical junction is intact.  IMPRESSION: 1. Small amount of subarachnoid hemorrhage within the right frontal lobe. 2. Multilevel degenerative change of the cervical spine without evidence for acute fracture. Critical Value/emergent results were called by telephone at the time of interpretation on 02/22/2014 at 9:07 pm to Dr. Evelina Bucy, who verbally acknowledged these results.   Electronically Signed   By: Lovey Newcomer M.D.   On: 02/22/2014 21:13    Assessment: 80 y.o. female presenting with acute right parasagittal small intraparenchymal and subarachnoid hemorrhage. Patient had no clinical signs of head trauma. Spontaneous intraparenchymal hemorrhage cannot be ruled out, particularly with elevated blood pressure on presentation. As well patient had a syncopal spell of unclear etiology and is shown considerable variability in heart rate, including bradycardia down to the high 30s.  Stroke Risk Factors - hyperlipidemia and hypertension  Plan: 1. Agree with  obtaining Cardiology consult 2. MRI, MRA  of the brain without contrast 3. PT  consult 4. Echocardiogram 5. Carotid dopplers 6. Prophylactic therapy-None 7. HgbA1c, fasting lipid panel 8. Repeat CT scan of the head without contrast in the a.m.  Patient's presenting with multiple acute problems, including intracranial hemorrhage, hypertensive emergency and intermittent bradycardia, as well as syncopal episode. Patient was admitted to neuro intensive care unit for further management. Total critical care time was 60 minutes  C.R. Nicole Kindred, MD [facility] [phone]  02/22/2014, 10:57 PM

## 2014-02-22 NOTE — Progress Notes (Signed)
Patient ID: [name], female   DOB: [birth year], 80 y.o.   MRN: [id number] I was called to review the CT on this elderly female who reportedly suffered a syncopal event this evening and fell. Ground level fall. On plavix. Neuro normal per EDP report. CT shows minimal tSAH in R frontal region without mass effect or shift. This is clinically insignificant and the likelihood of clinically significant enlargement is exceedingly small according to studies (repeat CT led to change in mgmt about 4% of the time). May need syncopal w/u by hospitalists. No need for acute neurosurgical intervention.

## 2014-02-22 NOTE — ED Notes (Signed)
EDP at bedside  

## 2014-02-22 NOTE — ED Provider Notes (Signed)
CSN: [id number]     Arrival date & time 02/22/14  1921 History   First MD Initiated Contact with Patient 02/22/14 1930     Chief Complaint  Patient presents with  . Loss of Consciousness  . Fall   Patient is a 80 y.o. female presenting with syncope and fall. The history is provided by the patient.  Loss of Consciousness Episode history:  Single Most recent episode:  Today Progression:  Resolved Chronicity:  New Context comment:  Unable to specify Witnessed: no   Associated symptoms: no chest pain, no confusion, no difficulty breathing, no dizziness, no fever, no headaches, no nausea, no palpitations, no rectal bleeding, no shortness of breath, no vomiting and no weakness   Risk factors: coronary artery disease   Fall Pertinent negatives include no abdominal pain, chest pain, fever, headaches, nausea, neck pain, rash, sore throat, vomiting or weakness.    Patient is an 80 year old Caucasian female with a history of CAD s/p MI who presents with a syncopal event at home this evening. The patient was called to dinner by her husband and was found down in the hallway. Patient does not recall the events immediately prior to the fall. She is on plavix for cardiac stents. She reports a h/o LBBB.  She denies chest pain, SOB, bloody stools, lightheadedness, room spinning sensation, tinnitus, or extremity weakness. The husband does not report and slurred speech. Pt unsure if she struck her head.  Past Medical History  Diagnosis Date  . Hypertension   . Myocardial infarction   . Postop Acute blood loss anemia   . Postop Hyponatremia   . Postop Transfusion history   . Breast cancer   . Rhinitis   . Osteopenia   . Rotator cuff syndrome   . Hypercholesteremia   . Hyperlipemia   . Anemia   . GERD (gastroesophageal reflux disease)   . Hemorrhoids   . Colon polyps   . Macular degeneration   . Umbilical hernia   . Osteopenia   . Obstructive sleep apnea on CPAP   . Ulnar neuropathy   .  Coronary artery disease 06/2006    STEMI inferior with BMS to mid RCA with mid basal inferior wall HK and luminal irrgularities elsewhere   Past Surgical History  Procedure Laterality Date  . Shoulder surgery    . Breast lumpectomy    . Wrist surgery    . Hip arthroplasty  06/06/2011    Procedure: ARTHROPLASTY BIPOLAR HIP;  Surgeon: Gearlean Alf;  Location: [facility];  Service: Orthopedics;  Laterality: Right;  . Coronary angioplasty with stent placement  06/2006    Mid RCA BMS   Family History  Problem Relation Age of Onset  . Heart attack Father   . CVA Sister    History  Substance Use Topics  . Smoking status: Never Smoker   . Smokeless tobacco: Not on file  . Alcohol Use: Yes     Comment: occasional wine   OB History   Grav Para Term Preterm Abortions TAB SAB Ect Mult Living                 Review of Systems  Constitutional: Negative for fever.  HENT: Negative for rhinorrhea and sore throat.   Eyes: Negative for visual disturbance.  Respiratory: Negative for chest tightness and shortness of breath.   Cardiovascular: Positive for syncope. Negative for chest pain and palpitations.  Gastrointestinal: Negative for nausea, vomiting, abdominal pain, constipation, blood in stool and anal bleeding.  Genitourinary: Negative for dysuria and hematuria.  Musculoskeletal: Negative for back pain and neck pain.  Skin: Negative for rash.  Neurological: Negative for dizziness, weakness and headaches.  Psychiatric/Behavioral: Negative for confusion.  All other systems reviewed and are negative.  Allergies  Chlorpheniramine and Codeine  Home Medications   Prior to Admission medications   Medication Sig Start Date End Date Taking? Authorizing Provider  cholecalciferol (VITAMIN D) 1000 UNITS tablet Take 1,000 Units by mouth at bedtime.   Yes Historical Provider, MD  clopidogrel (PLAVIX) 75 MG tablet Take 75 mg by mouth daily.  01/29/13  Yes Historical Provider, MD  Coenzyme Q10 (COQ10)  200 MG CAPS Take 1 tablet by mouth daily.   Yes Historical Provider, MD  ezetimibe (ZETIA) 10 MG tablet Take 10 mg by mouth 2 (two) times a week. Mondays and fridays   Yes Historical Provider, MD  lisinopril (PRINIVIL,ZESTRIL) 20 MG tablet Take 20 mg by mouth daily.   Yes Historical Provider, MD  Multiple Vitamins-Minerals (PRESERVISION/LUTEIN) CAPS Take 1 capsule by mouth 2 (two) times daily.   Yes Historical Provider, MD  nebivolol (BYSTOLIC) 5 MG tablet Take 5 mg by mouth daily.   Yes Historical Provider, MD  nitroGLYCERIN (NITROSTAT) 0.4 MG SL tablet Place 0.4 mg under the tongue every 5 (five) minutes as needed for chest pain.   Yes Historical Provider, MD  rosuvastatin (CRESTOR) 5 MG tablet Take 5 mg by mouth 2 (two) times a week. Tuesday and thursdays   Yes Historical Provider, MD   BP 204/72  Pulse 78  Temp(Src) 97.9 F (36.6 C) (Oral)  Resp 15  SpO2 100% Physical Exam  Constitutional: She is oriented to person, place, and time. She appears well-developed and well-nourished. No distress.  HENT:  Head: Normocephalic and atraumatic.  Mouth/Throat: Oropharynx is clear and moist.  Eyes: EOM are normal. Pupils are equal, round, and reactive to light.  Neck: Neck supple. No JVD present.  Cardiovascular: Normal rate, regular rhythm, normal heart sounds and intact distal pulses.  Exam reveals no gallop.   No murmur heard. Pulmonary/Chest: Effort normal and breath sounds normal. She has no wheezes. She has no rales.  Abdominal: Soft. She exhibits no distension. There is no tenderness.  Musculoskeletal: Normal range of motion. She exhibits no tenderness.  Neurological: She is alert and oriented to person, place, and time. No cranial nerve deficit. She exhibits normal muscle tone.  Skin: Skin is warm and dry. No rash noted.  Psychiatric: Her behavior is normal.   ED Course  Procedures  None   Labs Review Labs Reviewed  URINALYSIS, ROUTINE W REFLEX MICROSCOPIC - Abnormal; Notable for  the following:    APPearance CLOUDY (*)    Ketones, ur 15 (*)    Protein, ur 30 (*)    All other components within normal limits  CBC WITH DIFFERENTIAL - Abnormal; Notable for the following:    WBC 11.0 (*)    Neutrophils Relative % 80 (*)    Neutro Abs 8.8 (*)    Lymphocytes Relative 10 (*)    All other components within normal limits  BASIC METABOLIC PANEL - Abnormal; Notable for the following:    Sodium 131 (*)    Chloride 92 (*)    Glucose, Bld 123 (*)    GFR calc non Af Amer 83 (*)    Anion gap 16 (*)    All other components within normal limits  URINE MICROSCOPIC-ADD ON - Abnormal; Notable for the following:  Squamous Epithelial / LPF FEW (*)    Bacteria, UA FEW (*)    All other components within normal limits  APTT  PROTIME-INR    Imaging Review Dg Chest 2 View  02/22/2014   CLINICAL DATA:  Loss of consciousness, fall today. Acute shortness of breath. Back pain.  EXAM: CHEST  2 VIEW  COMPARISON:  06/05/2011 and 01/30/2007.  FINDINGS: Trachea is midline. Heart is at the upper limits of normal in size. Biapical pleural thickening. Lungs are clear. No pleural fluid. Flowing anterior osteophytosis in the thoracic spine. Degenerative changes in the right shoulder. Possible resection of the distal left clavicle.  IMPRESSION: No acute findings.   Electronically Signed   By: Lorin Picket M.D.   On: 02/22/2014 21:43   Ct Head Wo Contrast  02/22/2014   CLINICAL DATA:  Subsequent encounter, subarachnoid hemorrhage, on anticoagulation. New bradycardia.  EXAM: CT HEAD WITHOUT CONTRAST  TECHNIQUE: Contiguous axial images were obtained from the base of the skull through the vertex without intravenous contrast.  COMPARISON:  02/22/2014 at 2019 hr.  FINDINGS: Small amount of hyper attenuation is seen in the sulci along the medial right frontal lobe, unchanged. Question tiny amount of high attenuation along the sulci of the anterior right parietal lobe (image 19). No evidence of an acute  infarct, mass lesion, mass effect or hydrocephalus. Atrophy. Periventricular low attenuation. No air-fluid levels in the visualized portions of the paranasal sinuses and mastoid air cells.  IMPRESSION: 1. Acute subarachnoid hemorrhage along the medial right frontal lobe is stable from exam performed at 2019 hr the same day. Question tiny new focus of subarachnoid hemorrhage along the right parietal lobe. 2. Atrophy and chronic microvascular white matter ischemic changes.   Electronically Signed   By: Lorin Picket M.D.   On: 02/22/2014 22:47   Ct Head Wo Contrast  02/22/2014   CLINICAL DATA:  Fall with loss of consciousness.  Evaluate for CVA.  EXAM: CT HEAD WITHOUT CONTRAST  CT NECK WITHOUT CONTRAST  TECHNIQUE: Contiguous axial images were obtained from the base of the skull through the vertex without contrast. Multidetector CT imaging of the neck was performed using the standard protocol without intravenous contrast.  COMPARISON:  None.  FINDINGS: CT HEAD FINDINGS  Ventricles and sulci are appropriate for patient's age. There is a small amount of acute subarachnoid hemorrhage within the right frontal lobe (image 20; series 2). No mass effect. No mass lesion. Bilateral cataract surgery. Mild mucosal thickening of the ethmoid air cells and maxillary sinuses. Calvarium is intact.  Extensive bilateral subcortical and deep white matter hypodensities compatible with chronic small vessel ischemic change. Bilateral basal ganglia lacunar infarcts.  CT NECK FINDINGS  Visualized thyroid is unremarkable. Lung apices are clear. Multilevel degenerative disc and facet disease. Multilevel anterior endplate osteophytosis. Normal anatomic alignment of the cervical spine. Prevertebral soft tissues unremarkable. Cranial cervical junction is intact.  IMPRESSION: 1. Small amount of subarachnoid hemorrhage within the right frontal lobe. 2. Multilevel degenerative change of the cervical spine without evidence for acute fracture.  Critical Value/emergent results were called by telephone at the time of interpretation on 02/22/2014 at 9:07 pm to Dr. Evelina Bucy, who verbally acknowledged these results.   Electronically Signed   By: Lovey Newcomer M.D.   On: 02/22/2014 21:13   Ct Cervical Spine Wo Contrast  02/22/2014   CLINICAL DATA:  Fall with loss of consciousness.  Evaluate for CVA.  EXAM: CT HEAD WITHOUT CONTRAST  CT NECK WITHOUT CONTRAST  TECHNIQUE: Contiguous axial images were obtained from the base of the skull through the vertex without contrast. Multidetector CT imaging of the neck was performed using the standard protocol without intravenous contrast.  COMPARISON:  None.  FINDINGS: CT HEAD FINDINGS  Ventricles and sulci are appropriate for patient's age. There is a small amount of acute subarachnoid hemorrhage within the right frontal lobe (image 20; series 2). No mass effect. No mass lesion. Bilateral cataract surgery. Mild mucosal thickening of the ethmoid air cells and maxillary sinuses. Calvarium is intact.  Extensive bilateral subcortical and deep white matter hypodensities compatible with chronic small vessel ischemic change. Bilateral basal ganglia lacunar infarcts.  CT NECK FINDINGS  Visualized thyroid is unremarkable. Lung apices are clear. Multilevel degenerative disc and facet disease. Multilevel anterior endplate osteophytosis. Normal anatomic alignment of the cervical spine. Prevertebral soft tissues unremarkable. Cranial cervical junction is intact.  IMPRESSION: 1. Small amount of subarachnoid hemorrhage within the right frontal lobe. 2. Multilevel degenerative change of the cervical spine without evidence for acute fracture. Critical Value/emergent results were called by telephone at the time of interpretation on 02/22/2014 at 9:07 pm to Dr. Evelina Bucy, who verbally acknowledged these results.   Electronically Signed   By: Lovey Newcomer M.D.   On: 02/22/2014 21:13   MDM   Final diagnoses:  Intraparenchymal  hemorrhage of brain  Syncope and collapse  Prolonged Q-T interval on ECG    80 yo F with PMH of CAD s/p stents, on plavix, MI, HTN, HLD and GERD who presents after a syncopal event. Patient GCS 15, appears well on exam. Normal neuro exam. S4 on cardiac exam. Lungs clear. No external signs of trauma. No C spine tenderness. Will obtain CT head and C spine, CXR, EKG, CBC, BMP, UA. Urine neg for UTI. CT shows right frontal IPH, no midline shift. Labs with leukocytosis, hb normal. EKG with NSR, LBBB, no new ST or T wave changes, prolonged QTc (534) compared to EKG 11/10/12. NSGY consulted and does not feel emergent operative intervention is indicated.   10:20 PM patient noted to by hypertensive and bradycardic. Will re-scan head. Neurology consulted and will evaluate patient. Likley admit to neurology ICU.  Nicardipine drip started. Cardiology consult with the patient's bradycardia. No acute intervention at this time. Patient will still go to neuro ICU. Repeat CT essentially unchanged from previous CT. No hemodynamic or neurologic decompensation while in the ED. Patient was transferred to the ICU in stable condition.  Case discussed with Dr. Mingo Amber.  Gustavus Bryant, MD  Gustavus Bryant, MD 02/23/14 [id number]

## 2014-02-22 NOTE — ED Notes (Signed)
Neurologist Dr. Nicole Kindred at bedside.

## 2014-02-22 NOTE — ED Notes (Signed)
Dr. Colon Flattery here at bedside

## 2014-02-23 ENCOUNTER — Inpatient Hospital Stay (HOSPITAL_COMMUNITY): Payer: Medicare Other

## 2014-02-23 DIAGNOSIS — I459 Conduction disorder, unspecified: Secondary | ICD-10-CM

## 2014-02-23 DIAGNOSIS — I447 Left bundle-branch block, unspecified: Secondary | ICD-10-CM

## 2014-02-23 LAB — MRSA PCR SCREENING: MRSA by PCR: NEGATIVE

## 2014-02-23 MED ORDER — STROKE: EARLY STAGES OF RECOVERY BOOK
Freq: Once | Status: AC
  Administered 2014-02-23: 01:00:00
  Filled 2014-02-23: qty 1

## 2014-02-23 MED ORDER — SENNOSIDES-DOCUSATE SODIUM 8.6-50 MG PO TABS
1.0000 | ORAL_TABLET | Freq: Two times a day (BID) | ORAL | Status: DC
  Administered 2014-02-23 – 2014-02-25 (×4): 1 via ORAL
  Filled 2014-02-23 (×8): qty 1

## 2014-02-23 MED ORDER — LABETALOL HCL 5 MG/ML IV SOLN: 10.0000 mg | INTRAVENOUS | Status: DC | PRN

## 2014-02-23 MED ORDER — ATROPINE SULFATE 0.1 MG/ML IJ SOLN
INTRAMUSCULAR | Status: AC
  Filled 2014-02-23: qty 10

## 2014-02-23 MED ORDER — PANTOPRAZOLE SODIUM 40 MG IV SOLR
40.0000 mg | Freq: Every day | INTRAVENOUS | Status: DC
  Filled 2014-02-23: qty 40

## 2014-02-23 MED ORDER — ACETAMINOPHEN 650 MG RE SUPP: 650.0000 mg | RECTAL | Status: DC | PRN

## 2014-02-23 MED ORDER — ACETAMINOPHEN 325 MG PO TABS
650.0000 mg | ORAL_TABLET | ORAL | Status: DC | PRN
  Administered 2014-02-23 – 2014-02-24 (×2): 650 mg via ORAL
  Filled 2014-02-23 (×3): qty 2

## 2014-02-23 MED ORDER — PANTOPRAZOLE SODIUM 40 MG PO TBEC
40.0000 mg | DELAYED_RELEASE_TABLET | Freq: Every day | ORAL | Status: DC
  Administered 2014-02-23 – 2014-02-25 (×3): 40 mg via ORAL
  Filled 2014-02-23 (×3): qty 1

## 2014-02-23 NOTE — ED Provider Notes (Signed)
I saw and evaluated the patient, reviewed the resident's note and I agree with the findings and plan.   EKG Interpretation None       Date: 02/23/2014  Rate: 69  Rhythm: normal sinus rhythm  QRS Axis: left  Intervals: QRS widened  ST/T Wave abnormalities: normal  Conduction Disutrbances:left bundle branch block  Narrative Interpretation:   Old EKG Reviewed: unchanged  Patient here s/p syncope. Found down, unknown what happened. Husband says she reported headache after he found her, she does not remember this. Patient with normal neurologic exam here. Workup shows mild SAH in R frontal lobe. Neurosurgery states nothing to do. Neurology admitting. Started in nicardipine drip to ensure BP control. Patient also having some bradycardia. Repeat Head CT shows no extension of bleed.  Cards evaluated patient.  Patient admitted to Neuro ICU.  CRITICAL CARE Performed by: Osvaldo Shipper   Total critical care time: 30 minutes  Critical care time was exclusive of separately billable procedures and treating other patients.  Critical care was necessary to treat or prevent imminent or life-threatening deterioration.  Critical care was time spent personally by me on the following activities: development of treatment plan with patient and/or surrogate as well as nursing, discussions with consultants, evaluation of patient's response to treatment, examination of patient, obtaining history from patient or surrogate, ordering and performing treatments and interventions, ordering and review of laboratory studies, ordering and review of radiographic studies, pulse oximetry and re-evaluation of patient's condition.    Evelina Bucy, MD 02/23/14 1539

## 2014-02-23 NOTE — Evaluation (Signed)
SLP Cancellation Note  Patient Details Name: Lateshia Schmoker MRN: [id number] DOB: May 14, 1929   Cancelled treatment:       Reason Eval/Treat Not Completed:  (received order for speech evaluation 10/4, thanks for the consult)  Luanna Salk, [place] [facility] S[facility] [phone]

## 2014-02-23 NOTE — Consult Note (Signed)
ELECTROPHYSIOLOGY CONSULT NOTE    Patient ID: [name] MRN: [id number], DOB/AGE: Mar 25, 1929 80 y.o.  Admit date: 02/22/2014 Date of Consult: 02-23-2014  Primary Physician: Henrine Screws, MD Primary Cardiologist: Radford Pax  Reason for Consultation: syncope and bradycardia  HPI:  [name] is a 80 y.o. female with a past medical history significant for hypertension, CAD (s/p BMS to mid RCA), hyperlipidemia, sleep apnea (on CPAP), and LBBB.  She was admitted yesterday after a syncopal spell at home.  She was bradycardic on arrival in 2:1 heart block.  She has LBBB at baseline.   She struck her head when she fell; CT scan demonstrated acute SAH along the medial right frontal lobe.  This is being followed by neurosurgery  At home, she was on Bysolic, this has been held.  Her conduction has improved overnight with still some occasional blocked beats.    Echo pending this admission.  Lab work is reviewed.   She currently denies chest pain, shortness of breath, LE edema, palpitations, previous syncope.  No recent nausea, vomiting, fevers, or chills.  ROS is otherwise negative.   EP has been asked to evaluate for treatment options.   Past Medical History  Diagnosis Date  . Hypertension   . Myocardial infarction   . Postop Acute blood loss anemia   . Postop Hyponatremia   . Postop Transfusion history   . Breast cancer   . Rhinitis   . Osteopenia   . Rotator cuff syndrome   . Hypercholesteremia   . Hyperlipemia   . Anemia   . GERD (gastroesophageal reflux disease)   . Hemorrhoids   . Colon polyps   . Macular degeneration   . Umbilical hernia   . Osteopenia   . Obstructive sleep apnea on CPAP   . Ulnar neuropathy   . Coronary artery disease 06/2006    STEMI inferior with BMS to mid RCA with mid basal inferior wall HK and luminal irrgularities elsewhere     Surgical History:  Past Surgical History  Procedure Laterality Date  . Shoulder surgery    .  Breast lumpectomy    . Wrist surgery    . Hip arthroplasty  06/06/2011    Procedure: ARTHROPLASTY BIPOLAR HIP;  Surgeon: Gearlean Alf;  Location: [facility];  Service: Orthopedics;  Laterality: Right;  . Coronary angioplasty with stent placement  06/2006    Mid RCA BMS     Prescriptions prior to admission  Medication Sig Dispense Refill  . cholecalciferol (VITAMIN D) 1000 UNITS tablet Take 1,000 Units by mouth at bedtime.      . clopidogrel (PLAVIX) 75 MG tablet Take 75 mg by mouth daily.       . Coenzyme Q10 (COQ10) 200 MG CAPS Take 1 tablet by mouth daily.      Marland Kitchen ezetimibe (ZETIA) 10 MG tablet Take 10 mg by mouth 2 (two) times a week. Mondays and fridays      . lisinopril (PRINIVIL,ZESTRIL) 20 MG tablet Take 20 mg by mouth daily.      . Multiple Vitamins-Minerals (PRESERVISION/LUTEIN) CAPS Take 1 capsule by mouth 2 (two) times daily.      . nebivolol (BYSTOLIC) 5 MG tablet Take 5 mg by mouth daily.      . nitroGLYCERIN (NITROSTAT) 0.4 MG SL tablet Place 0.4 mg under the tongue every 5 (five) minutes as needed for chest pain.      . rosuvastatin (CRESTOR) 5 MG tablet Take 5 mg by mouth 2 (two)  times a week. Tuesday and thursdays        Inpatient Medications:  . pantoprazole (PROTONIX) IV  40 mg Intravenous QHS  . senna-docusate  1 tablet Oral BID    Allergies:  Allergies  Allergen Reactions  . Chlorpheniramine Anaphylaxis  . Codeine Anaphylaxis    History   Social History  . Marital Status: Married    Spouse Name: N/A    Number of Children: N/A  . Years of Education: N/A   Occupational History  . Not on file.   Social History Main Topics  . Smoking status: Never Smoker   . Smokeless tobacco: Not on file  . Alcohol Use: Yes     Comment: occasional wine  . Drug Use: Not on file  . Sexual Activity: Not on file   Other Topics Concern  . Not on file   Social History Narrative  . No narrative on file     Family History  Problem Relation Age of Onset  . Heart  attack Father   . CVA Sister     BP 116/79  Pulse 64  Temp(Src) 99.1 F (37.3 C) (Oral)  Resp 22  Ht 5' (1.524 m)  Wt 214 lb 15.2 oz (97.5 kg)  BMI 41.98 kg/m2  SpO2 98%  Physical Exam: Well appearing elderly woman, NAD HEENT: Unremarkable,[place], AT Neck:  6 JVD, no thyromegally Back:  No CVA tenderness Lungs:  Clear with no wheezes, rales, or rhonchi HEART:  Regular rate rhythm, no murmurs, no rubs, no clicks, S2 is split Abd:  soft, positive bowel sounds, no organomegally, no rebound, no guarding Ext:  2 plus pulses, no edema, no cyanosis, no clubbing Skin:  No rashes no nodules Neuro:  CN II through XII intact, motor grossly intact   Labs:   Lab Results  Component Value Date   WBC 11.0* 02/22/2014   HGB 13.9 02/22/2014   HCT 40.7 02/22/2014   MCV 88.7 02/22/2014   PLT 221 02/22/2014    Recent Labs Lab 02/22/14 2051  NA 131*  K 4.0  CL 92*  CO2 23  BUN 12  CREATININE 0.59  CALCIUM 9.0  GLUCOSE 123*     Radiology/Studies: Dg Chest 2 View 02/22/2014   CLINICAL DATA:  Loss of consciousness, fall today. Acute shortness of breath. Back pain.  EXAM: CHEST  2 VIEW  COMPARISON:  06/05/2011 and 01/30/2007.  FINDINGS: Trachea is midline. Heart is at the upper limits of normal in size. Biapical pleural thickening. Lungs are clear. No pleural fluid. Flowing anterior osteophytosis in the thoracic spine. Degenerative changes in the right shoulder. Possible resection of the distal left clavicle.  IMPRESSION: No acute findings.   Electronically Signed   By: Lorin Picket M.D.   On: 02/22/2014 21:43   Ct Head Wo Contrast 02/22/2014   CLINICAL DATA:  Subsequent encounter, subarachnoid hemorrhage, on anticoagulation. New bradycardia.  EXAM: CT HEAD WITHOUT CONTRAST  TECHNIQUE: Contiguous axial images were obtained from the base of the skull through the vertex without intravenous contrast.  COMPARISON:  02/22/2014 at 2019 hr.  FINDINGS: Small amount of hyper attenuation is seen in the sulci  along the medial right frontal lobe, unchanged. Question tiny amount of high attenuation along the sulci of the anterior right parietal lobe (image 19). No evidence of an acute infarct, mass lesion, mass effect or hydrocephalus. Atrophy. Periventricular low attenuation. No air-fluid levels in the visualized portions of the paranasal sinuses and mastoid air cells.  IMPRESSION: 1. Acute subarachnoid  hemorrhage along the medial right frontal lobe is stable from exam performed at 2019 hr the same day. Question tiny new focus of subarachnoid hemorrhage along the right parietal lobe. 2. Atrophy and chronic microvascular white matter ischemic changes.   Electronically Signed   By: Lorin Picket M.D.   On: 02/22/2014 22:47   EKG:2:1 heart block, ventricular rate 38, LBBB, QRS 176  TELEMETRY: sinus rhythm with occasional 2:1 heart block  A/P 1. Stokes-Adams Attacks 2. CAD on a beta blocker 3. Baseline LBBB Rec: the patient currently would be considered to have a reversible cause of syncope. Her Bystolic has been held. If she has more 2:1 AV block, then PPM would be recommended. If no more heart block off of Bystolic, then would consider placing an ILR. I would hope to catch recurrent minimally symptomatic bradycardia prior to another episode of syncope resulting in a subdural hematoma.  Mikle Bosworth.D.

## 2014-02-23 NOTE — Progress Notes (Signed)
STROKE TEAM PROGRESS NOTE   HISTORY [name] is an 80 y.o. female history of hypertension, coronary artery disease with stent placement, myocardial infarction, hyperlipidemia, and macular degeneration, brought to the emergency room following an episode of loss of consciousness. Patient does not recall any events immediately prior to losing consciousness. No evidence of trauma was seen. CT scan of her head showed a small area of intraparenchymal and subarachnoid hemorrhage involving the right parasagittal frontal area. Patient's also complaining of subscapular pain on the right. Blood pressure is elevated in the emergency room with systolic blood pressure up to 204. Heart rate has varied considerably with episodes of bradycardia down to the high 30s. She has no previous history of stroke or TIA. She's been taking Plavix 75 mg per day for antiplatelet therapy. NIH stroke score was 0. She was last known well 6 PM on 02/22/2014. Patient was not administered TPA secondary to hemorrhage. She was admitted to the neuro ICU for further evaluation and treatment.   SUBJECTIVE (INTERVAL HISTORY) Her husband and family are at the bedside.  Overall she feels her condition is stable. Husband is a patient of Dr. Leonie Man - he is asking if it is ok for him to take a xanax. Dr. Leonie Man feels dizziness he complains of is due to syncope. He advised not to take xanax and to exercise feet prior to getting up. Patient and wife live at [employer]. She reports she hit her head but has no external trauma. Denies trauma. No hx of passing out. She reports she fell face down; husband states she was leaning against the wall.   OBJECTIVE Temp:  [97.9 F (36.6 C)-99.1 F (37.3 C)] 98.7 F (37.1 C) (10/03 0756) Pulse Rate:  [30-87] 61 (10/03 1000) Cardiac Rhythm:  [-] Normal sinus rhythm (10/03 0800) Resp:  [12-31] 16 (10/03 1000) BP: (86-204)/(37-118) 136/40 mmHg (10/03 1000) SpO2:  [94 %-100 %] 98 % (10/03 1000) Weight:   [97.5 kg (214 lb 15.2 oz)] 97.5 kg (214 lb 15.2 oz) (10/03 0030)   Recent Labs Lab 02/22/14 2051  NA 131*  K 4.0  CL 92*  CO2 23  GLUCOSE 123*  BUN 12  CREATININE 0.59  CALCIUM 9.0   No results found for this basename: AST, ALT, ALKPHOS, BILITOT, PROT, ALBUMIN,  in the last 168 hours  Recent Labs Lab 02/22/14 2051  WBC 11.0*  NEUTROABS 8.8*  HGB 13.9  HCT 40.7  MCV 88.7  PLT 221   No results found for this basename: CKTOTAL, CKMB, CKMBINDEX, TROPONINI,  in the last 168 hours  Recent Labs  02/22/14 2051  LABPROT 13.0  INR 0.98    Recent Labs  02/22/14 1939  COLORURINE YELLOW  LABSPEC 1.010  PHURINE 7.5  GLUCOSEU NEGATIVE  HGBUR NEGATIVE  BILIRUBINUR NEGATIVE  KETONESUR 15*  PROTEINUR 30*  UROBILINOGEN 0.2  NITRITE NEGATIVE  LEUKOCYTESUR NEGATIVE    No results found for this basename: chol,  trig,  hdl,  cholhdl,  vldl,  ldlcalc   No results found for this basename: HGBA1C   No results found for this basename: labopia,  cocainscrnur,  labbenz,  amphetmu,  thcu,  labbarb    No results found for this basename: ETH,  in the last 168 hours  Dg Chest 2 View 02/22/2014    No acute findings.     Ct Head Wo Contrast 02/22/2014    1. Acute subarachnoid hemorrhage along the medial right frontal lobe is stable from exam performed at 2019 hr  the same day. Question tiny new focus of subarachnoid hemorrhage along the right parietal lobe. 2. Atrophy and chronic microvascular white matter ischemic changes.    02/22/2014    Small amount of subarachnoid hemorrhage within the right frontal lobe.   Ct Cervical Spine Wo Contrast 02/22/2014    Multilevel degenerative change of the cervical spine without evidence for acute fracture.      PHYSICAL EXAM Pleasant elderly Caucasian lady not in distress.Awake alert. Afebrile. Head is nontraumatic. Neck is supple without bruit. Hearing is normal. Cardiac exam no murmur or gallop. Lungs are clear to auscultation. Distal pulses  are well felt. Neurological Exam ;  Awake  Alert oriented x 3. Normal speech and language. Recall 2/3. .eye movements full without nystagmus.fundi were not visualized. Vision acuity and fields appear normal. Hearing is normal. Palatal movements are normal. Face symmetric. Tongue midline. Normal strength, tone, reflexes and coordination. Normal sensation. Gait deferred. ASSESSMENT/PLAN  [name] is a 80 y.o. female with history of hypertension, coronary artery disease with stent placement, myocardial infarction, hyperlipidemia, and macular degeneration, presenting following an episode of loss of consciousness. CT imaging confirms a right frontal lobe SAH.   Syncope. Likely the cause of her fall leading to  Small SAH, workup underway. Back Pain from fall  Cardiology  Stokes-adams attacks - syncope felt to be reversal. bysystolic held. Consider placing ILR is no more heart clock to catch symptomatic bradycardia prior to another episode of syncope  CAD on beta blocker  Baseline LBBB  Discussed possible discontinuation of plavix as MI 8 years ago  Stroke:  Right frontal lobe SAH. Likely capillary from small vessel disease, need to rule out mets or other source. Unsure that it was the cause of her syncope.  Workup underway.   Neurosurgeon, Jones, no need for intervention  Check MRI   clopidogrel 75 mg orally every day prior to admission. Given SAH, will not continue.  SCDs for VTE prophylaxis  Cardiac thin liduids  Bedrest. Ok to be OOB  Resultant no neuro deficits, resolved  Therapy recommendations:  pending   Ongoing aggressive risk factor management  Risk factor education  Keep in ICU for 24h  Disposition:  Return to [facility]  Hypertension  Home meds:   Prinvil, bystolic BP 87-564/33-295 past 24h (02/23/2014 @ 11:19 AM)  Stable now  Hyperlipidemia  Home meds:  Zetia, crestor  Continue statin at discharge  Other Stroke Risk Factors Advanced  age ETOH use   Morbid Obesity, Body mass index is 41.98 kg/(m^2).    Family hx stroke (sister)   Coronary artery disease - hx MI, stent 2008 Obstructive sleep apnea, on CPAP at home  Hospital day # 1  Burnetta Sabin, MSN, RN, ANVP-BC, ANP-BC, [facility] Pager: [phone] 02/23/2014 11:30 AM   I have personally examined this patient, reviewed notes, independently viewed imaging studies, participated in medical decision making and plan of care. I have made any additions or clarifications directly to the above note. Agree with note above.   Antony Contras, MD Medical Director [facility] Pager: [phone] 02/23/2014 9:15 PM   To contact Stroke Continuity provider, please refer to [email or web address] After hours, contact General Neurology

## 2014-02-23 NOTE — Progress Notes (Addendum)
INITIAL NUTRITION ASSESSMENT  DOCUMENTATION CODES Per approved criteria  -Morbid Obesity   INTERVENTION: If unable to pass swallow evaluation and enteral nutrition warranted, recommend initiation of Osmolite 1.2 at 20 ml/hr and advance by 10 ml q 4 hours, to goal of 60 ml/hr. Goal regimen will provide: 1728 kcal, 81 grams protein, 1181 ml free water. RD to continue to follow nutrition care plan.  NUTRITION DIAGNOSIS: Inadequate oral intake related to inability to eat as evidenced by NPO status.   Goal: Diet advancement vs initiation of nutrition support as tolerated. Intake to meet >90% of estimated nutrition needs.  Monitor:  weight trends, lab trends, I/O's, SLP eval, transition to PO's vs EN  Reason for Assessment: Malnutrition Screening Tool  80 y.o. female  Admitting Dx: subarachnoid hemorrhage  ASSESSMENT: PMHx significant for HTN, CAD with stent placement, MI, hyperlipidemia, macular degeneration. Admitted with HA s/p loss of consciousness. Work-up reveals small intraparenchymal and subarachnoid hemorrhage.  SLP evaluation ordered for tomorrow, 10/4.  Pt without any significant fat or muscle wasting on exam. Appears well-nourished. Reports that she is hungry and had adequate appetite PTA. Discussed role of RD and encouraged adequate intake if/when diet advances.   Sodium low at 131 Potassium WNL Glucose elevated at 123  Height: Ht Readings from Last 1 Encounters:  02/23/14 5' (1.524 m)    Weight: Wt Readings from Last 1 Encounters:  02/23/14 214 lb 15.2 oz (97.5 kg)    Ideal Body Weight: 100 lb  % Ideal Body Weight: 214%  Wt Readings from Last 10 Encounters:  02/23/14 214 lb 15.2 oz (97.5 kg)  09/05/13 208 lb (94.348 kg)  07/27/13 206 lb (93.441 kg)  02/26/13 205 lb (92.987 kg)  08/05/11 192 lb 4.8 oz (87.227 kg)  06/05/11 204 lb 12.9 oz (92.9 kg)  06/05/11 204 lb 12.9 oz (92.9 kg)    Usual Body Weight: 205 lb  % Usual Body Weight: 104%  BMI:   Body mass index is 41.98 kg/(m^2). Obese Class III  Estimated Nutritional Needs: Kcal: 1625 - 1800 Protein: 75 - 90 g Fluid: 1.8 - 2 liters  Skin: intact  Diet Order: NPO  EDUCATION NEEDS: -No education needs identified at this time   Intake/Output Summary (Last 24 hours) at 02/23/14 0949 Last data filed at 02/23/14 0800  Gross per 24 hour  Intake    500 ml  Output   1550 ml  Net  -1050 ml    Last BM: PTA   Labs:   Recent Labs Lab 02/22/14 2051  NA 131*  K 4.0  CL 92*  CO2 23  BUN 12  CREATININE 0.59  CALCIUM 9.0  GLUCOSE 123*   Lipid Panel  No results found for this basename: chol, trig, hdl, cholhdl, vldl, ldlcalc, ldldirect     CBG (last 3)  No results found for this basename: GLUCAP,  in the last 72 hours  Scheduled Meds: . pantoprazole (PROTONIX) IV  40 mg Intravenous QHS  . senna-docusate  1 tablet Oral BID    Continuous Infusions:   Past Medical History  Diagnosis Date  . Hypertension   . Myocardial infarction   . Postop Acute blood loss anemia   . Postop Hyponatremia   . Postop Transfusion history   . Breast cancer   . Rhinitis   . Osteopenia   . Rotator cuff syndrome   . Hypercholesteremia   . Hyperlipemia   . Anemia   . GERD (gastroesophageal reflux disease)   . Hemorrhoids   .  Colon polyps   . Macular degeneration   . Umbilical hernia   . Osteopenia   . Obstructive sleep apnea on CPAP   . Ulnar neuropathy   . Coronary artery disease 06/2006    STEMI inferior with BMS to mid RCA with mid basal inferior wall HK and luminal irrgularities elsewhere    Past Surgical History  Procedure Laterality Date  . Shoulder surgery    . Breast lumpectomy    . Wrist surgery    . Hip arthroplasty  06/06/2011    Procedure: ARTHROPLASTY BIPOLAR HIP;  Surgeon: Gearlean Alf;  Location: [facility] ORS;  Service: Orthopedics;  Laterality: Right;  . Coronary angioplasty with stent placement  06/2006    Mid RCA BMS    Inda Coke MS, RD,  LDN Inpatient Registered Dietitian After-hours pager: [phone]

## 2014-02-24 LAB — GLUCOSE, CAPILLARY
GLUCOSE-CAPILLARY: 103 mg/dL — AB (ref 70–99)
Glucose-Capillary: 105 mg/dL — ABNORMAL HIGH (ref 70–99)

## 2014-02-24 MED ORDER — CHLORHEXIDINE GLUCONATE 4 % EX LIQD
60.0000 mL | Freq: Once | CUTANEOUS | Status: AC
  Administered 2014-02-24: 4 via TOPICAL

## 2014-02-24 MED ORDER — SODIUM CHLORIDE 0.9 % IR SOLN: 80.0000 mg | Status: DC

## 2014-02-24 MED ORDER — ATORVASTATIN CALCIUM 10 MG PO TABS
10.0000 mg | ORAL_TABLET | Freq: Every day | ORAL | Status: DC
  Filled 2014-02-24: qty 1

## 2014-02-24 MED ORDER — CEFAZOLIN SODIUM-DEXTROSE 2-3 GM-% IV SOLR
2.0000 g | INTRAVENOUS | Status: DC
  Filled 2014-02-24: qty 50

## 2014-02-24 MED ORDER — SODIUM CHLORIDE 0.9 % IR SOLN
80.0000 mg | Status: DC
  Filled 2014-02-24: qty 2

## 2014-02-24 MED ORDER — ROSUVASTATIN CALCIUM 5 MG PO TABS
5.0000 mg | ORAL_TABLET | ORAL | Status: DC
  Filled 2014-02-24: qty 1

## 2014-02-24 MED ORDER — EZETIMIBE 10 MG PO TABS
10.0000 mg | ORAL_TABLET | ORAL | Status: DC
  Administered 2014-02-25: 10 mg via ORAL
  Filled 2014-02-24: qty 1

## 2014-02-24 MED ORDER — CHLORHEXIDINE GLUCONATE 4 % EX LIQD
60.0000 mL | Freq: Once | CUTANEOUS | Status: DC
  Filled 2014-02-24: qty 60

## 2014-02-24 MED ORDER — SODIUM CHLORIDE 0.9 % IV SOLN: INTRAVENOUS | Status: DC

## 2014-02-24 MED ORDER — YOU HAVE A PACEMAKER BOOK
Freq: Once | Status: AC
Start: 2014-02-24 — End: 2014-02-24
  Administered 2014-02-24: 20:00:00
  Filled 2014-02-24: qty 1

## 2014-02-24 MED ORDER — CEFAZOLIN SODIUM-DEXTROSE 2-3 GM-% IV SOLR: 2.0000 g | INTRAVENOUS | Status: DC

## 2014-02-24 NOTE — Progress Notes (Signed)
    In review of notes, she was transferred this morning to cardiology service. Plan is for pacemaker tomorrow. This was discussed between Dr. Leonie Man and Dr. Cristopher Peru.   Candee Furbish, MD

## 2014-02-24 NOTE — Progress Notes (Signed)
STROKE TEAM PROGRESS NOTE   HISTORY [name] is an 80 y.o. female history of hypertension, coronary artery disease with stent placement, myocardial infarction, hyperlipidemia, and macular degeneration, brought to the emergency room following an episode of loss of consciousness. Patient does not recall any events immediately prior to losing consciousness. No evidence of trauma was seen. CT scan of her head showed a small area of intraparenchymal and subarachnoid hemorrhage involving the right parasagittal frontal area. Patient's also complaining of subscapular pain on the right. Blood pressure is elevated in the emergency room with systolic blood pressure up to 204. Heart rate has varied considerably with episodes of bradycardia down to the high 30s. She has no previous history of stroke or TIA. She's been taking Plavix 75 mg per day for antiplatelet therapy. NIH stroke score was 0. She was last known well 6 PM on 02/22/2014. Patient was not administered TPA secondary to hemorrhage. She was admitted to the neuro ICU for further evaluation and treatment.   SUBJECTIVE (INTERVAL HISTORY) [name] and family at bedside. Patient up in chair in room. MRI scan shows subarachnoid hemorrhage in the frontal regions likely posttraumatic  OBJECTIVE Temp:  [98 F (36.7 C)-99.5 F (37.5 C)] 98.3 F (36.8 C) (10/04 0751) Pulse Rate:  [33-113] 33 (10/04 0400) Cardiac Rhythm:  [-] Sinus bradycardia (10/03 2000) Resp:  [10-23] 20 (10/04 0400) BP: (107-159)/(29-100) 150/42 mmHg (10/04 0400) SpO2:  [92 %-99 %] 95 % (10/04 0400)   Recent Labs Lab 02/22/14 2051  NA 131*  K 4.0  CL 92*  CO2 23  GLUCOSE 123*  BUN 12  CREATININE 0.59  CALCIUM 9.0   No results found for this basename: AST, Richmond, ALKPHOS, BILITOT, PROT, ALBUMIN,  in the last 168 hours  Recent Labs Lab 02/22/14 2051  WBC 11.0*  NEUTROABS 8.8*  HGB 13.9  HCT 40.7  MCV 88.7  PLT 221   No results found for this basename:  CKTOTAL, CKMB, CKMBINDEX, TROPONINI,  in the last 168 hours  Recent Labs  02/22/14 2051  LABPROT 13.0  INR 0.98    Recent Labs  02/22/14 1939  COLORURINE YELLOW  LABSPEC 1.010  PHURINE 7.5  GLUCOSEU NEGATIVE  HGBUR NEGATIVE  BILIRUBINUR NEGATIVE  KETONESUR 15*  PROTEINUR 30*  UROBILINOGEN 0.2  NITRITE NEGATIVE  LEUKOCYTESUR NEGATIVE    No results found for this basename: chol,  trig,  hdl,  cholhdl,  vldl,  ldlcalc   No results found for this basename: HGBA1C   No results found for this basename: labopia,  cocainscrnur,  labbenz,  amphetmu,  thcu,  labbarb    No results found for this basename: ETH,  in the last 168 hours  Dg Chest 2 View 02/22/2014    No acute findings.     Ct Head Wo Contrast 02/22/2014    1. Acute subarachnoid hemorrhage along the medial right frontal lobe is stable from exam performed at 2019 hr the same day. Question tiny new focus of subarachnoid hemorrhage along the right parietal lobe. 2. Atrophy and chronic microvascular white matter ischemic changes.    02/22/2014    Small amount of subarachnoid hemorrhage within the right frontal lobe.   Ct Cervical Spine Wo Contrast 02/22/2014    Multilevel degenerative change of the cervical spine without evidence for acute fracture.   Mr Brain Wo Contrast 02/23/2014   Area of parenchymal hemorrhage/ subarachnoid hemorrhage is confirmed from recent CT in the RIGHT frontal lobe. Suspect subcortical shearing injury from recent  fall, potentiated by anticoagulation with Plavix.  BILATERAL fairly symmetric restricted diffusion in the parasagittal anterior frontal lobes. I believe the most likely consideration is some regional effect of subarachnoid hemorrhage, but cannot exclude recent epileptiform activity. Mild cerebritis or ischemia are less favored.  Atrophy and small vessel disease      PHYSICAL EXAM Pleasant elderly Caucasian lady not in distress.Awake alert. Afebrile. Head is nontraumatic. Neck is supple  without bruit. Hearing is normal. Cardiac exam no murmur or gallop. Lungs are clear to auscultation. Distal pulses are well felt. Neurological Exam ;  Awake  Alert oriented x 3. Normal speech and language. Recall 2/3. .eye movements full without nystagmus.fundi were not visualized. Vision acuity and fields appear normal. Hearing is normal. Palatal movements are normal. Face symmetric. Tongue midline. Normal strength, tone, reflexes and coordination. Normal sensation. Gait deferred. ASSESSMENT/PLAN  [name] is a 80 y.o. female with history of hypertension, coronary artery disease with stent placement, myocardial infarction, hyperlipidemia, and macular degeneration, presenting following an episode of loss of consciousness. CT imaging confirms a right frontal lobe SAH.   Syncope he cause of her fall leading to small SAH in setting of plavix use from fall  Cardiology plans pacer tomorrow  Stokes-adams attacks   CAD on beta blocker  Baseline LBBB  Right frontal lobe SAH secondary to trauma. Syncope led to fall. Patient did not have a stroke.  Neurosurgeon, [name], no need for intervention  MRI - right frontal SAH, appears traumatic   clopidogrel 75 mg orally every day prior to admission. Given SAH, ok to use aspirin 81 mg 3 days post hemorrhage. Anticoagulation not recommended  SCDs for VTE prophylaxis  Cardiac thin liduids  OOB  Resultant no neuro deficits, resolved  Therapy recommendations:  pending   Ongoing aggressive risk factor management  Risk factor education  Transfer to a tele floor   Disposition:  Return to [facility]  Hypertension  Home meds:   Prinvil, bystolic BP 628-366/29-47 past 24h (02/24/2014 @ 10:47 AM)  Stable now  Hyperlipidemia  Home meds:  Zetia, crestor  Continue statin at discharge  Other Stroke Risk Factors Advanced age ETOH use   Morbid Obesity, Body mass index is 41.98 kg/(m^2).    Family hx stroke (sister)    Coronary artery disease - hx MI, stent 2008 Obstructive sleep apnea, on CPAP at home  Dr. Leonie Richmond spoke with [name]. We will transfer to cardiology service and continue to follow.  Hospital day # 2  Rachel Sabin, MSN, RN, ANVP-BC, ANP-BC, [name] Pager: [phone] 02/24/2014 10:47 AM  I have personally examined this patient, reviewed notes, independently viewed imaging studies, participated in medical decision making and plan of care. I have made any additions or clarifications directly to the above note. Agree with note above. Discussed with patient, family and [name] we will transfer the patient to the cardiac telemetry unit and to Dr. Tanna Furry service. She will get pacemaker over the next one to 2 days  Antony Contras, [place] Pager: [phone] 02/24/2014 2:42 PM    To contact Stroke Continuity provider, please refer to [email or web address] After hours, contact General Neurology

## 2014-02-24 NOTE — Evaluation (Signed)
Physical Therapy Evaluation Patient Details Name: [name] MRN: [id number] DOB: 07/06/1928 Today's Date: 02/24/2014   History of Present Illness  HPI: [name] is an 80 y.o. female history of hypertension, coronary artery disease with stent placement, myocardial infarction, hyperlipidemia, and macular degeneration, brought to the emergency room following an episode of loss of consciousness. Patient does not recall any events immediately prior to losing consciousness. No evidence of trauma was seen. CT scan of her head showed a small area of intraparenchymal and subarachnoid hemorrhage involving the right parasagittal frontal area. Patient's also complaining of subscapular pain on the right. Blood pressure is elevated in the emergency room with systolic blood pressure up to 204. Heart rate has varied considerably with episodes of bradycardia down to the high 30s. She has no previous history of stroke or TIA. She's been taking Plavix 75 mg per day for antiplatelet therapy. NIH stroke score was 0. Plan for pacemeaker placement 10/5  Clinical Impression   Pt admitted with above. Pt currently with functional limitations due to the deficits listed below (see PT Problem List).  Pt will benefit from skilled PT to increase their independence and safety with mobility to allow discharge to the venue listed below.       Follow Up Recommendations Home health PT;Supervision/Assistance - 24 hour    Equipment Recommendations  Rolling walker with 5" wheels;3in1 (PT)    Recommendations for Other Services OT consult     Precautions / Restrictions Precautions Precautions: Fall Precaution Comments: monitor for presyncopal symptoms      Mobility  Bed Mobility                  Transfers Overall transfer level: Needs assistance Equipment used: Rolling walker (2 wheeled) Transfers: Sit to/from Stand Sit to Stand: Mod assist         General transfer comment: light mod  antigravity assist to stand from recliner  Ambulation/Gait Ambulation/Gait assistance: Min guard Ambulation Distance (Feet):  (pivot steps recliner to wheelchair) Assistive device: Rolling walker (2 wheeled)       General Gait Details: Dependent on UE support on RW for blance with pivotal steps   Stairs            Wheelchair Mobility    Modified Rankin (Stroke Patients Only) Modified Rankin (Stroke Patients Only) Pre-Morbid Rankin Score: No symptoms Modified Rankin: Moderate disability     Balance Overall balance assessment: Needs assistance         Standing balance support: Bilateral upper extremity supported Standing balance-Leahy Scale: Poor                               Pertinent Vitals/Pain Pain Assessment: 0-10 Pain Score: 6  Pain Location: R subscapular pain with deep inspiration Pain Descriptors / Indicators: Aching Pain Intervention(s): Limited activity within patient's tolerance;Monitored during session;Repositioned;Patient requesting pain meds-RN notified HR range 33-49 during session Numerous cues to self-monitor for activity tolerance    Home Living Family/patient expects to be discharged to:: Private residence Living Arrangements: Spouse/significant other Available Help at Discharge: Family;Available 24 hours/day Type of Home: House Home Access: Level entry     Home Layout: One level Home Equipment: Cane - single point Additional Comments: Pt and husband live in house at [employer]     Prior Function Level of Independence: Independent         Comments: cane prn     Hand Dominance  Extremity/Trunk Assessment   Upper Extremity Assessment: Overall WFL for tasks assessed (for imple mobility tasks)           Lower Extremity Assessment: Generalized weakness         Communication   Communication: No difficulties  Cognition Arousal/Alertness: Awake/alert Behavior During Therapy: WFL for tasks  assessed/performed Overall Cognitive Status: Within Functional Limits for tasks assessed                      General Comments      Exercises        Assessment/Plan    PT Assessment Patient needs continued PT services  PT Diagnosis Generalized weakness   PT Problem List Decreased strength;Decreased activity tolerance;Decreased balance;Decreased mobility;Decreased knowledge of use of DME;Cardiopulmonary status limiting activity;Pain  PT Treatment Interventions DME instruction;Gait training;Functional mobility training;Therapeutic activities;Therapeutic exercise;Balance training;Patient/family education   PT Goals (Current goals can be found in the Care Plan section) Acute Rehab PT Goals Patient Stated Goal: for pacemaker placement to go well PT Goal Formulation: With patient Time For Goal Achievement: 03/10/14 Potential to Achieve Goals: Good    Frequency Min 3X/week   Barriers to discharge        Co-evaluation               End of Session   Activity Tolerance: Patient tolerated treatment well Patient left: in chair;with call bell/phone within reach;with family/visitor present (in wheelchair in prep to transfer units) Nurse Communication: Mobility status         Time: 1053-1130 PT Time Calculation (min): 37 min   Charges:   PT Evaluation $Initial PT Evaluation Tier I: 1 Procedure PT Treatments $Therapeutic Activity: 23-37 mins   PT G CodesQuin Hoop 02/24/2014, 2:03 PM  Roney Marion, [place] Pager [phone] Office [phone]

## 2014-02-24 NOTE — Progress Notes (Signed)
Patient ID: [name], female   DOB: [birth year], 80 y.o.   MRN: [id number]   Patient Name: [name] Date of Encounter: 02/24/2014     Active Problems:   HTN (hypertension)   Coronary artery disease   Subarachnoid hemorrhage following injury with brief loss of consciousness but without open intracranial wound   Syncope and collapse   Hypertensive urgency    SUBJECTIVE She feels a bit fatigued today but no syncope, chest pain or sob.  CURRENT MEDS . atorvastatin  10 mg Oral q1800  . [START ON 02/25/2014] ezetimibe  10 mg Oral Once per day on Mon Fri  . pantoprazole  40 mg Oral QHS  . senna-docusate  1 tablet Oral BID    OBJECTIVE  Filed Vitals:   02/24/14 0930 02/24/14 1000 02/24/14 1100 02/24/14 1158  BP: 136/41 139/36 158/39 154/42  Pulse: 38 38 37 35  Temp:    98.7 F (37.1 C)  TempSrc:    Oral  Resp: 18 21 16 18   Height:      Weight:      SpO2: 96% 96% 96% 96%    Intake/Output Summary (Last 24 hours) at 02/24/14 1315 Last data filed at 02/24/14 0800  Gross per 24 hour  Intake    360 ml  Output    650 ml  Net   -290 ml   Filed Weights   02/23/14 0030  Weight: 214 lb 15.2 oz (97.5 kg)    PHYSICAL EXAM  General: Pleasant, 80 yo woman, NAD. Neuro: Alert and oriented X 3. Moves all extremities spontaneously. Psych: Normal affect. HEENT:  Normal  Neck: Supple without bruits or JVD. Lungs:  Resp regular and unlabored, CTA. Heart: RRR no s3, s4, or murmurs. Abdomen: Soft, non-tender, non-distended, BS + x 4.  Extremities: No clubbing, cyanosis or edema. DP/PT/Radials 2+ and equal bilaterally.  Accessory Clinical Findings  CBC  Recent Labs  02/22/14 2051  WBC 11.0*  NEUTROABS 8.8*  HGB 13.9  HCT 40.7  MCV 88.7  PLT 867   Basic Metabolic Panel  Recent Labs  02/22/14 2051  NA 131*  K 4.0  CL 92*  CO2 23  GLUCOSE 123*  BUN 12  CREATININE 0.59  CALCIUM 9.0   Liver Function Tests No results found for this basename:  AST, ALT, ALKPHOS, BILITOT, PROT, ALBUMIN,  in the last 72 hours No results found for this basename: LIPASE, AMYLASE,  in the last 72 hours Cardiac Enzymes No results found for this basename: CKTOTAL, CKMB, CKMBINDEX, TROPONINI,  in the last 72 hours BNP No components found with this basename: POCBNP,  D-Dimer No results found for this basename: DDIMER,  in the last 72 hours Hemoglobin A1C No results found for this basename: HGBA1C,  in the last 72 hours Fasting Lipid Panel No results found for this basename: CHOL, HDL, LDLCALC, TRIG, CHOLHDL, LDLDIRECT,  in the last 72 hours Thyroid Function Tests No results found for this basename: TSH, T4TOTAL, FREET3, T3FREE, THYROIDAB,  in the last 72 hours  TELE  NSR with 2:1 AV block and LBBB  Radiology/Studies  Dg Chest 2 View  02/22/2014   CLINICAL DATA:  Loss of consciousness, fall today. Acute shortness of breath. Back pain.  EXAM: CHEST  2 VIEW  COMPARISON:  06/05/2011 and 01/30/2007.  FINDINGS: Trachea is midline. Heart is at the upper limits of normal in size. Biapical pleural thickening. Lungs are clear. No pleural fluid. Flowing anterior osteophytosis in the thoracic spine. Degenerative  changes in the right shoulder. Possible resection of the distal left clavicle.  IMPRESSION: No acute findings.   Electronically Signed   By: Lorin Picket M.D.   On: 02/22/2014 21:43   Ct Head Wo Contrast  02/22/2014   CLINICAL DATA:  Subsequent encounter, subarachnoid hemorrhage, on anticoagulation. New bradycardia.  EXAM: CT HEAD WITHOUT CONTRAST  TECHNIQUE: Contiguous axial images were obtained from the base of the skull through the vertex without intravenous contrast.  COMPARISON:  02/22/2014 at 2019 hr.  FINDINGS: Small amount of hyper attenuation is seen in the sulci along the medial right frontal lobe, unchanged. Question tiny amount of high attenuation along the sulci of the anterior right parietal lobe (image 19). No evidence of an acute infarct,  mass lesion, mass effect or hydrocephalus. Atrophy. Periventricular low attenuation. No air-fluid levels in the visualized portions of the paranasal sinuses and mastoid air cells.  IMPRESSION: 1. Acute subarachnoid hemorrhage along the medial right frontal lobe is stable from exam performed at 2019 hr the same day. Question tiny new focus of subarachnoid hemorrhage along the right parietal lobe. 2. Atrophy and chronic microvascular white matter ischemic changes.   Electronically Signed   By: Lorin Picket M.D.   On: 02/22/2014 22:47   Ct Head Wo Contrast  02/22/2014   CLINICAL DATA:  Fall with loss of consciousness.  Evaluate for CVA.  EXAM: CT HEAD WITHOUT CONTRAST  CT NECK WITHOUT CONTRAST  TECHNIQUE: Contiguous axial images were obtained from the base of the skull through the vertex without contrast. Multidetector CT imaging of the neck was performed using the standard protocol without intravenous contrast.  COMPARISON:  None.  FINDINGS: CT HEAD FINDINGS  Ventricles and sulci are appropriate for patient's age. There is a small amount of acute subarachnoid hemorrhage within the right frontal lobe (image 20; series 2). No mass effect. No mass lesion. Bilateral cataract surgery. Mild mucosal thickening of the ethmoid air cells and maxillary sinuses. Calvarium is intact.  Extensive bilateral subcortical and deep white matter hypodensities compatible with chronic small vessel ischemic change. Bilateral basal ganglia lacunar infarcts.  CT NECK FINDINGS  Visualized thyroid is unremarkable. Lung apices are clear. Multilevel degenerative disc and facet disease. Multilevel anterior endplate osteophytosis. Normal anatomic alignment of the cervical spine. Prevertebral soft tissues unremarkable. Cranial cervical junction is intact.  IMPRESSION: 1. Small amount of subarachnoid hemorrhage within the right frontal lobe. 2. Multilevel degenerative change of the cervical spine without evidence for acute fracture. Critical  Value/emergent results were called by telephone at the time of interpretation on 02/22/2014 at 9:07 pm to Dr. Evelina Bucy, who verbally acknowledged these results.   Electronically Signed   By: Lovey Newcomer M.D.   On: 02/22/2014 21:13   Ct Cervical Spine Wo Contrast  02/22/2014   CLINICAL DATA:  Fall with loss of consciousness.  Evaluate for CVA.  EXAM: CT HEAD WITHOUT CONTRAST  CT NECK WITHOUT CONTRAST  TECHNIQUE: Contiguous axial images were obtained from the base of the skull through the vertex without contrast. Multidetector CT imaging of the neck was performed using the standard protocol without intravenous contrast.  COMPARISON:  None.  FINDINGS: CT HEAD FINDINGS  Ventricles and sulci are appropriate for patient's age. There is a small amount of acute subarachnoid hemorrhage within the right frontal lobe (image 20; series 2). No mass effect. No mass lesion. Bilateral cataract surgery. Mild mucosal thickening of the ethmoid air cells and maxillary sinuses. Calvarium is intact.  Extensive bilateral subcortical and deep  white matter hypodensities compatible with chronic small vessel ischemic change. Bilateral basal ganglia lacunar infarcts.  CT NECK FINDINGS  Visualized thyroid is unremarkable. Lung apices are clear. Multilevel degenerative disc and facet disease. Multilevel anterior endplate osteophytosis. Normal anatomic alignment of the cervical spine. Prevertebral soft tissues unremarkable. Cranial cervical junction is intact.  IMPRESSION: 1. Small amount of subarachnoid hemorrhage within the right frontal lobe. 2. Multilevel degenerative change of the cervical spine without evidence for acute fracture. Critical Value/emergent results were called by telephone at the time of interpretation on 02/22/2014 at 9:07 pm to Dr. Evelina Bucy, who verbally acknowledged these results.   Electronically Signed   By: Lovey Newcomer M.D.   On: 02/22/2014 21:13   Mr Brain Wo Contrast  02/23/2014   CLINICAL DATA:  Syncopal  episode 24 hr ago. Recent fall. Patient anticoagulated, on Plavix.  EXAM: MRI HEAD WITHOUT CONTRAST  TECHNIQUE: Multiplanar, multiecho pulse sequences of the brain and surrounding structures were obtained without intravenous contrast.  COMPARISON:  CT head 02/22/2009.  FINDINGS: Mild gyriform restricted diffusion is seen in the anterior parasagittal frontal cortex on the RIGHT and LEFT, fairly symmetric. This is a nonspecific finding which I suspect represents some type of local effect of subarachnoid blood, but could represent recent epileptiform activity. This pattern is unlikely to represent acute ischemia or cerebritis.  On susceptibility weighted imaging, the area of parenchymal hemorrhage/ subarachnoid hemorrhage is redemonstrated in the RIGHT medial frontal lobe/cingulate gyrus, just above the anterior body corpus callosum. As seen on image 124 series 8, the hemorrhage is curvilinear and measures approximately 5 x 18 mm cross-section. No other areas of hemorrhage are definitely identified. There is no evidence for vascular malformation.  Generalized atrophy is present. There is moderately advanced small vessel disease. No osseous lesions are seen. Pituitary and cerebellar tonsils are unremarkable. Mild cervical spondylosis is incidentally noted. Flow voids in the major intracranial vascular structures are preserved.  Postcontrast imaging was not obtained.  Compared with most recent CT, the area questioned in the RIGHT frontal region over the convexity is not clearly abnormal.  IMPRESSION: Area of parenchymal hemorrhage/ subarachnoid hemorrhage is confirmed from recent CT in the RIGHT frontal lobe. Suspect subcortical shearing injury from recent fall, potentiated by anticoagulation with Plavix.  BILATERAL fairly symmetric restricted diffusion in the parasagittal anterior frontal lobes. I believe the most likely consideration is some regional effect of subarachnoid hemorrhage, but cannot exclude recent  epileptiform activity. Mild cerebritis or ischemia are less favored.  Atrophy and small vessel disease   Electronically Signed   By: Rolla Flatten M.D.   On: 02/23/2014 16:11    ASSESSMENT AND PLAN  1. [place] syncope 2. Baseline LBBB 3. 2:1 AV block, symptomatic 4. CAD, s/p PCI 5. HTN Rec: Despite stopping Bystolic now over 48 hours since her last dose, she has ongoing 2:1 AV block. She also has baseline LBBB. The mechanism of her syncope is intermittant CHB with long pauses and this will require PPM insertion. I have discussed the indications, risks/benefits/goals/expectations of PPM insertion with the patient and her family and she wishes to proceed.  Gregg Taylor,M.D.  02/24/2014 1:15 PM

## 2014-02-25 ENCOUNTER — Encounter (HOSPITAL_COMMUNITY)
Admission: EM | Disposition: A | Discharge status: Home / Self Care | Payer: Self-pay | Source: Home / Self Care | Attending: Internal Medicine

## 2014-02-25 DIAGNOSIS — I441 Atrioventricular block, second degree: Secondary | ICD-10-CM

## 2014-02-25 DIAGNOSIS — Z950 Presence of cardiac pacemaker: Secondary | ICD-10-CM

## 2014-02-25 DIAGNOSIS — I609 Nontraumatic subarachnoid hemorrhage, unspecified: Secondary | ICD-10-CM

## 2014-02-25 HISTORY — DX: Presence of cardiac pacemaker: Z95.0

## 2014-02-25 HISTORY — PX: PERMANENT PACEMAKER INSERTION: SHX5480

## 2014-02-25 LAB — GLUCOSE, CAPILLARY
Glucose-Capillary: 107 mg/dL — ABNORMAL HIGH (ref 70–99)
Glucose-Capillary: 110 mg/dL — ABNORMAL HIGH (ref 70–99)
Glucose-Capillary: 120 mg/dL — ABNORMAL HIGH (ref 70–99)
Glucose-Capillary: 87 mg/dL (ref 70–99)

## 2014-02-25 SURGERY — PERMANENT PACEMAKER INSERTION
Anesthesia: LOCAL

## 2014-02-25 MED ORDER — LIDOCAINE HCL (PF) 1 % IJ SOLN
INTRAMUSCULAR | Status: AC
  Filled 2014-02-25: qty 60

## 2014-02-25 MED ORDER — CEFAZOLIN SODIUM-DEXTROSE 2-3 GM-% IV SOLR
INTRAVENOUS | Status: AC
  Filled 2014-02-25: qty 50

## 2014-02-25 MED ORDER — ONDANSETRON HCL 4 MG/2ML IJ SOLN: 4.0000 mg | Freq: Four times a day (QID) | INTRAMUSCULAR | Status: DC | PRN

## 2014-02-25 MED ORDER — MIDAZOLAM HCL 5 MG/5ML IJ SOLN
INTRAMUSCULAR | Status: AC
  Filled 2014-02-25: qty 5

## 2014-02-25 MED ORDER — HEPARIN (PORCINE) IN NACL 2-0.9 UNIT/ML-% IJ SOLN
INTRAMUSCULAR | Status: AC
Start: 2014-02-25 — End: 2014-02-25
  Filled 2014-02-25: qty 500

## 2014-02-25 MED ORDER — NITROGLYCERIN 0.4 MG SL SUBL: 0.4000 mg | SUBLINGUAL_TABLET | SUBLINGUAL | Status: DC | PRN

## 2014-02-25 MED ORDER — ACETAMINOPHEN 325 MG PO TABS
325.0000 mg | ORAL_TABLET | ORAL | Status: DC | PRN
  Administered 2014-02-25 – 2014-02-26 (×4): 650 mg via ORAL
  Filled 2014-02-25 (×3): qty 2

## 2014-02-25 MED ORDER — CEFAZOLIN SODIUM-DEXTROSE 2-3 GM-% IV SOLR
2.0000 g | Freq: Four times a day (QID) | INTRAVENOUS | Status: AC
  Administered 2014-02-25 – 2014-02-26 (×3): 2 g via INTRAVENOUS
  Filled 2014-02-25 (×3): qty 50

## 2014-02-25 MED ORDER — METOPROLOL TARTRATE 1 MG/ML IV SOLN
INTRAVENOUS | Status: AC
  Filled 2014-02-25: qty 5

## 2014-02-25 MED ORDER — FENTANYL CITRATE 0.05 MG/ML IJ SOLN
INTRAMUSCULAR | Status: AC
  Filled 2014-02-25: qty 2

## 2014-02-25 MED ORDER — LISINOPRIL 20 MG PO TABS
20.0000 mg | ORAL_TABLET | Freq: Every day | ORAL | Status: DC
  Administered 2014-02-25 – 2014-02-26 (×2): 20 mg via ORAL
  Filled 2014-02-25 (×2): qty 1

## 2014-02-25 NOTE — Discharge Instructions (Signed)
° ° °  Supplemental Discharge Instructions for  Pacemaker Patients  Activity No heavy lifting or vigorous activity with your left/right arm for 6 to 8 weeks.  Do not raise your left/right arm above your head for one week.  Gradually raise your affected arm as drawn below.            02/28/14                         03/01/14                     03/02/14                  03/03/14  NO DRIVING until cleared by your doctor.  WOUND CARE   Keep the wound area clean and dry.  Do not get this area wet for one week. No showers for one week; you may shower on   03/05/14  .   The tape/steri-strips on your wound will fall off; do not pull them off.  No bandage is needed on the site.  DO  NOT apply any creams, oils, or ointments to the wound area.   If you notice any drainage or discharge from the wound, any swelling or bruising at the site, or you develop a fever > 101? F after you are discharged home, call the office at once.  Special Instructions   You are still able to use cellular telephones; use the ear opposite the side where you have your pacemaker/defibrillator.  Avoid carrying your cellular phone near your device.   When traveling through airports, show security personnel your identification card to avoid being screened in the metal detectors.  Ask the security personnel to use the hand wand.   Avoid arc welding equipment, MRI testing (magnetic resonance imaging), TENS units (transcutaneous nerve stimulators).  Call the office for questions about other devices.   Avoid electrical appliances that are in poor condition or are not properly grounded.   Microwave ovens are safe to be near or to operate.

## 2014-02-25 NOTE — CV Procedure (Signed)
SURGEON:  Cristopher Peru, MD     PREPROCEDURE DIAGNOSIS:  Symptomatic Bradycardia due to symptomatic type 2 second degree AV block with LBBB    POSTPROCEDURE DIAGNOSIS:  Same as preprocedure diagnosis     PROCEDURES:   1. Pacemaker implantation.     INTRODUCTION: [name] is a 80 y.o. female  with a history of bradycardia who presents today for pacemaker implantation.  The patient reports an episode of frank syncope and then after stopping her bystolic for over 48 hours had persistent 2:1 AV block with LBBB.  No reversible causes have been identified.  The patient therefore presents today for pacemaker implantation.     DESCRIPTION OF PROCEDURE:  Informed written consent was obtained, and   the patient was brought to the electrophysiology lab in a fasting state.  The patient required no sedation for the procedure today.  The patients left chest was prepped and draped in the usual sterile fashion by the EP lab staff. The skin overlying the left deltopectoral region was infiltrated with lidocaine for local analgesia.  A 4-cm incision was made over the left deltopectoral region.  A left subcutaneous pacemaker pocket was fashioned using a combination of sharp and blunt dissection. Electrocautery was required to assure hemostasis.     RA/RV Lead Placement: The left axillary vein was therefore directly visualized and cannulated.  Through the left axillary vein, a medtronic (serial number [id number]) right atrial lead and a Medtronic (serial number [id number]) right ventricular lead were advanced with fluoroscopic visualization into the right atrial appendage and right ventricular apical septal positions respectively.  Initial atrial lead P- waves measured 3.1 mV with impedance of 424 ohms and a threshold of 0.6 V at 0.5 msec.  Right ventricular lead R-waves measured 14 mV with an impedance of 614 ohms and a threshold of 1.2 V at 0.5 msec.  Both leads were secured to the pectoralis fascia using #2-0  silk over the suture sleeves.   Device Placement:  The leads were then connected to a Medtronic (serial number [id number]) pacemaker.  The pocket was irrigated with copious gentamicin solution.  The pacemaker was then placed into the pocket.  The pocket was then closed in 2 layers with 2.0 Vicryl suture for the subcutaneous and subcuticular layers.  Steri-Strips and a sterile dressing were then applied.  There were no early apparent complications.     CONCLUSIONS:   1. Successful implantation of a Medtronic dual-chamber pacemaker for symptomatic bradycardia due to second degree AV block  2. No early apparent complications.           Cristopher Peru, MD 02/25/2014 4:38 PM

## 2014-02-25 NOTE — Progress Notes (Signed)
Occupational Therapy Evaluation Patient Details Name: [name] MRN: [id number] DOB: [birth year] Today's Date: 02/25/2014    History of Present Illness HPI: Carson Meche is an 80 y.o. female history of hypertension, coronary artery disease with stent placement, myocardial infarction, hyperlipidemia, and macular degeneration, brought to the emergency room following an episode of loss of consciousness and fall resulting in + CT scan of her head showed a small area of intraparenchymal and subarachnoid hemorrhage involving the right parasagittal frontal area. Patient's also complaining of subscapular pain on the right. Plan for pacemeaker placement 10/5   Clinical Impression   PTA, pt lived at [place] with husband. Pt had recently been discharged from [facility] s/p R hip fracture/posterior THA. At this time, pt moving well and could D/C home with home health; however, pt scheduled to have pacemaker placement today. Given patient's history, feel pt will require more assistance for ADL and be at increased risk for falls. Unsure if husband can provide level of assistance needed due to his recent CVA. Pt may benefit from short stay at SNF to return safely to PLOF.     Follow Up Recommendations  Supervision/Assistance - 24 hour;SNF;Other (comment) (pending progress) see comment above   Equipment Recommendations  None recommended by OT    Recommendations for Other Services       Precautions / Restrictions Precautions Precautions: Fall Precaution Comments: monitor for presyncopal symptoms; posterior hip replacemetn @ 2 months ago      Mobility Bed Mobility Overal bed mobility: Modified Independent                Transfers Overall transfer level: Needs assistance Equipment used: Rolling walker (2 wheeled) Transfers: Sit to/from Stand Sit to Stand: Min assist              Balance Overall balance assessment: Needs assistance Sitting-balance support: Feet  supported Sitting balance-Leahy Scale: Fair     Standing balance support: During functional activity;Bilateral upper extremity supported Standing balance-Leahy Scale: Poor                              ADL Overall ADL's : Needs assistance/impaired Eating/Feeding: NPO   Grooming: Set up   Upper Body Bathing: Set up;Sitting   Lower Body Bathing: Minimal assistance;Sit to/from stand   Upper Body Dressing : Set up;Sitting   Lower Body Dressing: Minimal assistance;Sit to/from stand   Toilet Transfer: Minimal assistance;Ambulation;BSC   Toileting- Water quality scientist and Hygiene: Min guard;Sit to/from stand       Functional mobility during ADLs: Minimal assistance;Rolling walker General ADL Comments: Pt with good safety awareness     Vision                     Perception     Praxis Praxis Praxis tested?: Within functional limits    Pertinent Vitals/Pain Pain Assessment: 0-10 Pain Score: 4  Pain Location: R upper back Pain Descriptors / Indicators: Aching Pain Intervention(s): Limited activity within patient's tolerance;Repositioned;Other (comment) (rec use of K-heat pad)     Hand Dominance Right   Extremity/Trunk Assessment Upper Extremity Assessment Upper Extremity Assessment: Overall WFL for tasks assessed   Lower Extremity Assessment Lower Extremity Assessment: Defer to PT evaluation   Cervical / Trunk Assessment Cervical / Trunk Assessment: Normal   Communication Communication Communication: No difficulties   Cognition Arousal/Alertness: Awake/alert Behavior During Therapy: WFL for tasks assessed/performed Overall Cognitive Status: Within Functional Limits for tasks  assessed                     General Comments       Exercises       Shoulder Instructions      Home Living Family/patient expects to be discharged to:: Private residence Living Arrangements: Spouse/significant other Available Help at Discharge:  Family;Available 24 hours/day Type of Home: House Home Access: Level entry     Home Layout: One level     Bathroom Shower/Tub: Occupational psychologist: Handicapped height Bathroom Accessibility: Yes How Accessible: Accessible via walker;Accessible via wheelchair Home Equipment: Cane - single point;Grab bars - toilet;Grab bars - tub/shower;Shower seat;Bedside commode   Additional Comments: Pt and husband live in house at [employer]  (husband had a CVA 3 months ago)  Lives With: Spouse    Prior Functioning/Environment Level of Independence: Independent        Comments: cane prn    OT Diagnosis: Generalized weakness   OT Problem List: Decreased strength;Decreased activity tolerance;Decreased knowledge of use of DME or AE;Pain   OT Treatment/Interventions: Self-care/ADL training;Therapeutic exercise;Energy conservation;DME and/or AE instruction;Therapeutic activities;Patient/family education;Balance training    OT Goals(Current goals can be found in the care plan section) Acute Rehab OT Goals Patient Stated Goal: for pacemaker placement to go well OT Goal Formulation: With patient Time For Goal Achievement: 03/11/14 Potential to Achieve Goals: Good  OT Frequency: Min 2X/week   Barriers to D/C: Other (comment) (Husband had CVA 3 months ago)          Co-evaluation              End of Session Equipment Utilized During Treatment: Gait belt;Rolling walker Nurse Communication: Mobility status;Other (comment) (rec use of K-pad)  Activity Tolerance: Patient tolerated treatment well Patient left: in chair;with call bell/phone within reach;with family/visitor present   Time: 1055-1120 OT Time Calculation (min): 25 min Charges:  OT General Charges $OT Visit: 1 Procedure OT Evaluation $Initial OT Evaluation Tier I: 1 Procedure OT Treatments $Self Care/Home Management : 8-22 mins G-Codes:    Jahlen Bollman,HILLARY 2014/03/01, 12:00 PM   [facility], OTR/L   [phone] 01-Mar-2014

## 2014-02-25 NOTE — Progress Notes (Signed)
Pt.'s CPAP was set up at the bedside on 11cm H2O per home settings via nasal mask. Pt.'s daughter states that she will assist pt. With CPAP tonight. Daughter & pt. Are aware to call RT if any assistance is needed with CPAP.

## 2014-02-25 NOTE — Progress Notes (Signed)
Patient ID: [name], female   DOB: [birth year], 80 y.o.   MRN: [id number]   Patient Name: [name] Date of Encounter: 02/25/2014     Active Problems:   HTN (hypertension)   Coronary artery disease   Subarachnoid hemorrhage following injury with brief loss of consciousness but without open intracranial wound   Syncope and collapse   Hypertensive urgency    SUBJECTIVE Feels well. Still with intermittent 2:1 AV block. No chest pain or sob.  CURRENT MEDS .  ceFAZolin (ANCEF) IV  2 g Intravenous On Call  . chlorhexidine  60 mL Topical Once  . ezetimibe  10 mg Oral Once per day on Mon Fri  . gentamicin irrigation  80 mg Irrigation On Call  . pantoprazole  40 mg Oral QHS  . [START ON 02/26/2014] rosuvastatin  5 mg Oral Once per day on Tue Thu  . senna-docusate  1 tablet Oral BID    OBJECTIVE  Filed Vitals:   02/24/14 1638 02/24/14 2000 02/25/14 0000 02/25/14 0400  BP: 154/38 167/35 169/49 174/42  Pulse: 37 38 37 36  Temp: 98.8 F (37.1 C) 98 F (36.7 C) 98.1 F (36.7 C) 97.8 F (36.6 C)  TempSrc: Oral  Oral Oral  Resp: 20 18 18 18   Height:      Weight:      SpO2: 93% 96% 97% 97%    Intake/Output Summary (Last 24 hours) at 02/25/14 0748 Last data filed at 02/25/14 0618  Gross per 24 hour  Intake    360 ml  Output    775 ml  Net   -415 ml   Filed Weights   02/23/14 0030  Weight: 214 lb 15.2 oz (97.5 kg)    PHYSICAL EXAM  General: Pleasant, NAD. Neuro: Alert and oriented X 3. Moves all extremities spontaneously. Psych: Normal affect. HEENT:  Normal  Neck: Supple without bruits or JVD. Lungs:  Resp regular and unlabored, CTA. Heart: RRR no s3, s4, or murmurs. Abdomen: Soft, non-tender, non-distended, BS + x 4.  Extremities: No clubbing, cyanosis or edema. DP/PT/Radials 2+ and equal bilaterally.  Accessory Clinical Findings  CBC  Recent Labs  02/22/14 2051  WBC 11.0*  NEUTROABS 8.8*  HGB 13.9  HCT 40.7  MCV 88.7  PLT 250    Basic Metabolic Panel  Recent Labs  02/22/14 2051  NA 131*  K 4.0  CL 92*  CO2 23  GLUCOSE 123*  BUN 12  CREATININE 0.59  CALCIUM 9.0   Liver Function Tests No results found for this basename: AST, ALT, ALKPHOS, BILITOT, PROT, ALBUMIN,  in the last 72 hours No results found for this basename: LIPASE, AMYLASE,  in the last 72 hours Cardiac Enzymes No results found for this basename: CKTOTAL, CKMB, CKMBINDEX, TROPONINI,  in the last 72 hours BNP No components found with this basename: POCBNP,  D-Dimer No results found for this basename: DDIMER,  in the last 72 hours Hemoglobin A1C No results found for this basename: HGBA1C,  in the last 72 hours Fasting Lipid Panel No results found for this basename: CHOL, HDL, LDLCALC, TRIG, CHOLHDL, LDLDIRECT,  in the last 72 hours Thyroid Function Tests No results found for this basename: TSH, T4TOTAL, FREET3, T3FREE, THYROIDAB,  in the last 72 hours  TELE  nsr with 2:1 AV block  Radiology/Studies  Dg Chest 2 View  02/22/2014   CLINICAL DATA:  Loss of consciousness, fall today. Acute shortness of breath. Back pain.  EXAM: CHEST  2  VIEW  COMPARISON:  06/05/2011 and 01/30/2007.  FINDINGS: Trachea is midline. Heart is at the upper limits of normal in size. Biapical pleural thickening. Lungs are clear. No pleural fluid. Flowing anterior osteophytosis in the thoracic spine. Degenerative changes in the right shoulder. Possible resection of the distal left clavicle.  IMPRESSION: No acute findings.   Electronically Signed   By: Lorin Picket M.D.   On: 02/22/2014 21:43   Ct Head Wo Contrast  02/22/2014   CLINICAL DATA:  Subsequent encounter, subarachnoid hemorrhage, on anticoagulation. New bradycardia.  EXAM: CT HEAD WITHOUT CONTRAST  TECHNIQUE: Contiguous axial images were obtained from the base of the skull through the vertex without intravenous contrast.  COMPARISON:  02/22/2014 at 2019 hr.  FINDINGS: Small amount of hyper attenuation is  seen in the sulci along the medial right frontal lobe, unchanged. Question tiny amount of high attenuation along the sulci of the anterior right parietal lobe (image 19). No evidence of an acute infarct, mass lesion, mass effect or hydrocephalus. Atrophy. Periventricular low attenuation. No air-fluid levels in the visualized portions of the paranasal sinuses and mastoid air cells.  IMPRESSION: 1. Acute subarachnoid hemorrhage along the medial right frontal lobe is stable from exam performed at 2019 hr the same day. Question tiny new focus of subarachnoid hemorrhage along the right parietal lobe. 2. Atrophy and chronic microvascular white matter ischemic changes.   Electronically Signed   By: Lorin Picket M.D.   On: 02/22/2014 22:47   Ct Head Wo Contrast  02/22/2014   CLINICAL DATA:  Fall with loss of consciousness.  Evaluate for CVA.  EXAM: CT HEAD WITHOUT CONTRAST  CT NECK WITHOUT CONTRAST  TECHNIQUE: Contiguous axial images were obtained from the base of the skull through the vertex without contrast. Multidetector CT imaging of the neck was performed using the standard protocol without intravenous contrast.  COMPARISON:  None.  FINDINGS: CT HEAD FINDINGS  Ventricles and sulci are appropriate for patient's age. There is a small amount of acute subarachnoid hemorrhage within the right frontal lobe (image 20; series 2). No mass effect. No mass lesion. Bilateral cataract surgery. Mild mucosal thickening of the ethmoid air cells and maxillary sinuses. Calvarium is intact.  Extensive bilateral subcortical and deep white matter hypodensities compatible with chronic small vessel ischemic change. Bilateral basal ganglia lacunar infarcts.  CT NECK FINDINGS  Visualized thyroid is unremarkable. Lung apices are clear. Multilevel degenerative disc and facet disease. Multilevel anterior endplate osteophytosis. Normal anatomic alignment of the cervical spine. Prevertebral soft tissues unremarkable. Cranial cervical junction  is intact.  IMPRESSION: 1. Small amount of subarachnoid hemorrhage within the right frontal lobe. 2. Multilevel degenerative change of the cervical spine without evidence for acute fracture. Critical Value/emergent results were called by telephone at the time of interpretation on 02/22/2014 at 9:07 pm to Dr. Evelina Bucy, who verbally acknowledged these results.   Electronically Signed   By: Lovey Newcomer M.D.   On: 02/22/2014 21:13   Ct Cervical Spine Wo Contrast  02/22/2014   CLINICAL DATA:  Fall with loss of consciousness.  Evaluate for CVA.  EXAM: CT HEAD WITHOUT CONTRAST  CT NECK WITHOUT CONTRAST  TECHNIQUE: Contiguous axial images were obtained from the base of the skull through the vertex without contrast. Multidetector CT imaging of the neck was performed using the standard protocol without intravenous contrast.  COMPARISON:  None.  FINDINGS: CT HEAD FINDINGS  Ventricles and sulci are appropriate for patient's age. There is a small amount of acute subarachnoid  hemorrhage within the right frontal lobe (image 20; series 2). No mass effect. No mass lesion. Bilateral cataract surgery. Mild mucosal thickening of the ethmoid air cells and maxillary sinuses. Calvarium is intact.  Extensive bilateral subcortical and deep white matter hypodensities compatible with chronic small vessel ischemic change. Bilateral basal ganglia lacunar infarcts.  CT NECK FINDINGS  Visualized thyroid is unremarkable. Lung apices are clear. Multilevel degenerative disc and facet disease. Multilevel anterior endplate osteophytosis. Normal anatomic alignment of the cervical spine. Prevertebral soft tissues unremarkable. Cranial cervical junction is intact.  IMPRESSION: 1. Small amount of subarachnoid hemorrhage within the right frontal lobe. 2. Multilevel degenerative change of the cervical spine without evidence for acute fracture. Critical Value/emergent results were called by telephone at the time of interpretation on 02/22/2014 at 9:07  pm to Dr. Evelina Bucy, who verbally acknowledged these results.   Electronically Signed   By: Lovey Newcomer M.D.   On: 02/22/2014 21:13   Mr Brain Wo Contrast  02/23/2014   CLINICAL DATA:  Syncopal episode 24 hr ago. Recent fall. Patient anticoagulated, on Plavix.  EXAM: MRI HEAD WITHOUT CONTRAST  TECHNIQUE: Multiplanar, multiecho pulse sequences of the brain and surrounding structures were obtained without intravenous contrast.  COMPARISON:  CT head 02/22/2009.  FINDINGS: Mild gyriform restricted diffusion is seen in the anterior parasagittal frontal cortex on the RIGHT and LEFT, fairly symmetric. This is a nonspecific finding which I suspect represents some type of local effect of subarachnoid blood, but could represent recent epileptiform activity. This pattern is unlikely to represent acute ischemia or cerebritis.  On susceptibility weighted imaging, the area of parenchymal hemorrhage/ subarachnoid hemorrhage is redemonstrated in the RIGHT medial frontal lobe/cingulate gyrus, just above the anterior body corpus callosum. As seen on image 124 series 8, the hemorrhage is curvilinear and measures approximately 5 x 18 mm cross-section. No other areas of hemorrhage are definitely identified. There is no evidence for vascular malformation.  Generalized atrophy is present. There is moderately advanced small vessel disease. No osseous lesions are seen. Pituitary and cerebellar tonsils are unremarkable. Mild cervical spondylosis is incidentally noted. Flow voids in the major intracranial vascular structures are preserved.  Postcontrast imaging was not obtained.  Compared with most recent CT, the area questioned in the RIGHT frontal region over the convexity is not clearly abnormal.  IMPRESSION: Area of parenchymal hemorrhage/ subarachnoid hemorrhage is confirmed from recent CT in the RIGHT frontal lobe. Suspect subcortical shearing injury from recent fall, potentiated by anticoagulation with Plavix.  BILATERAL fairly  symmetric restricted diffusion in the parasagittal anterior frontal lobes. I believe the most likely consideration is some regional effect of subarachnoid hemorrhage, but cannot exclude recent epileptiform activity. Mild cerebritis or ischemia are less favored.  Atrophy and small vessel disease   Electronically Signed   By: Rolla Flatten M.D.   On: 02/23/2014 16:11    ASSESSMENT AND PLAN 1. Symptomatic AV block 2. Stokes-Adams attacks 3. CAD Rec: For DDD PM today.   Gregg Taylor,M.D.  02/25/2014 7:48 AM

## 2014-02-25 NOTE — Progress Notes (Signed)
STROKE TEAM PROGRESS NOTE   HISTORY [name] is an 80 y.o. female history of hypertension, coronary artery disease with stent placement, myocardial infarction, hyperlipidemia, and macular degeneration, brought to the emergency room following an episode of loss of consciousness. Patient does not recall any events immediately prior to losing consciousness. No evidence of trauma was seen. CT scan of her head showed a small area of intraparenchymal and subarachnoid hemorrhage involving the right parasagittal frontal area. Patient's also complaining of subscapular pain on the right. Blood pressure is elevated in the emergency room with systolic blood pressure up to 204. Heart rate has varied considerably with episodes of bradycardia down to the high 30s. She has no previous history of stroke or TIA. She's been taking Plavix 75 mg per day for antiplatelet therapy. NIH stroke score was 0. She was last known well 6 PM on 02/22/2014. Patient was not administered TPA secondary to hemorrhage. She was admitted to the neuro ICU for further evaluation and treatment.   SUBJECTIVE (INTERVAL HISTORY) Dr. Lovena Richmond and family at bedside. Patient up in chair in room. MRI scan shows subarachnoid hemorrhage in the frontal regions likely posttraumatic  OBJECTIVE Temp:  [97.8 F (36.6 C)-98.8 F (37.1 C)] 98.3 F (36.8 C) (10/05 0855) Pulse Rate:  [35-38] 38 (10/05 0855) Cardiac Rhythm:  [-] Sinus bradycardia;Bundle branch block (10/05 0001) Resp:  [16-21] 18 (10/05 0855) BP: (139-174)/(35-49) 160/47 mmHg (10/05 0855) SpO2:  [93 %-97 %] 97 % (10/05 0855)   Recent Labs Lab 02/22/14 2051  NA 131*  K 4.0  CL 92*  CO2 23  GLUCOSE 123*  BUN 12  CREATININE 0.59  CALCIUM 9.0   No results found for this basename: AST, ALT, ALKPHOS, BILITOT, PROT, ALBUMIN,  in the last 168 hours  Recent Labs Lab 02/22/14 2051  WBC 11.0*  NEUTROABS 8.8*  HGB 13.9  HCT 40.7  MCV 88.7  PLT 221   No results found  for this basename: CKTOTAL, CKMB, CKMBINDEX, TROPONINI,  in the last 168 hours  Recent Labs  02/22/14 2051  LABPROT 13.0  INR 0.98    Recent Labs  02/22/14 1939  COLORURINE YELLOW  LABSPEC 1.010  PHURINE 7.5  GLUCOSEU NEGATIVE  HGBUR NEGATIVE  BILIRUBINUR NEGATIVE  KETONESUR 15*  PROTEINUR 30*  UROBILINOGEN 0.2  NITRITE NEGATIVE  LEUKOCYTESUR NEGATIVE    No results found for this basename: chol,  trig,  hdl,  cholhdl,  vldl,  ldlcalc   No results found for this basename: HGBA1C   No results found for this basename: labopia,  cocainscrnur,  labbenz,  amphetmu,  thcu,  labbarb    No results found for this basename: ETH,  in the last 168 hours  Dg Chest 2 View 02/22/2014    No acute findings.     Ct Head Wo Contrast 02/22/2014    1. Acute subarachnoid hemorrhage along the medial right frontal lobe is stable from exam performed at 2019 hr the same day. Question tiny new focus of subarachnoid hemorrhage along the right parietal lobe. 2. Atrophy and chronic microvascular white matter ischemic changes.    02/22/2014    Small amount of subarachnoid hemorrhage within the right frontal lobe.   Ct Cervical Spine Wo Contrast 02/22/2014    Multilevel degenerative change of the cervical spine without evidence for acute fracture.   Mr Brain Wo Contrast 02/23/2014   Area of parenchymal hemorrhage/ subarachnoid hemorrhage is confirmed from recent CT in the RIGHT frontal lobe. Suspect subcortical shearing injury  from recent fall, potentiated by anticoagulation with Plavix.  BILATERAL fairly symmetric restricted diffusion in the parasagittal anterior frontal lobes. I believe the most likely consideration is some regional effect of subarachnoid hemorrhage, but cannot exclude recent epileptiform activity. Mild cerebritis or ischemia are less favored.  Atrophy and small vessel disease      PHYSICAL EXAM Pleasant elderly Caucasian lady not in distress.Awake alert. Afebrile. Head is nontraumatic.  Neck is supple without bruit. Hearing is normal. Cardiac exam no murmur or gallop. Lungs are clear to auscultation. Distal pulses are well felt. Neurological Exam ;  Awake  Alert oriented x 3. Normal speech and language. Recall 2/3. .eye movements full without nystagmus.fundi were not visualized. Vision acuity and fields appear normal. Hearing is normal. Palatal movements are normal. Face symmetric. Tongue midline. Normal strength, tone, reflexes and coordination. Normal sensation. Gait deferred. ASSESSMENT/PLAN  [name] is a 80 y.o. female with history of hypertension, coronary artery disease with stent placement, myocardial infarction, hyperlipidemia, and macular degeneration, presenting following an episode of loss of consciousness. CT imaging confirms a right frontal lobe SAH.   Syncope he cause of her fall leading to small SAH in setting of plavix use from fall  Cardiology plans pacer tomorrow  Stokes-adams attacks   CAD on beta blocker  Baseline LBBB  Right frontal lobe SAH secondary to trauma. Syncope led to fall. Patient did not have a stroke.  Neurosurgeon, [name], no need for intervention  MRI - right frontal SAH, appears traumatic   clopidogrel 75 mg orally every day prior to admission. Given SAH, ok to use aspirin 81 mg 3 days post hemorrhage. Anticoagulation not recommended  SCDs for VTE prophylaxis  NPO thin liduids  OOB  Resultant no neuro deficits, resolved  Therapy recommendations:  pending   Ongoing aggressive risk factor management  Risk factor education  Transfer to a tele floor   Disposition:  Return to [facility]  Hypertension  Home meds:   Prinvil, bystolic BP 502-774/12-87 past 24h (02/25/2014 @ 9:59 AM)  Stable now  Hyperlipidemia  Home meds:  Zetia, crestor  Continue statin at discharge  Other Stroke Risk Factors Advanced age ETOH use   Morbid Obesity, Body mass index is 41.98 kg/(m^2).    Family hx stroke  (sister)   Coronary artery disease - hx MI, stent 2008 Obstructive sleep apnea, on CPAP at home  Dr. Leonie Richmond spoke with Dr. Lovena Richmond. We will transfer to cardiology service and continue to follow.  Hospital day # 3  Rachel Sabin, MSN, RN, ANVP-BC, ANP-BC, [facility] Pager: [phone] 02/25/2014 9:59 AM    I have personally examined this patient, reviewed notes, independently viewed imaging studies, participated in medical decision making and plan of care. I have made any additions or clarifications directly to the above note. Agree with note above. Discussed with patient and son and stroke team will sign off. Call for questions.  Rachel Contras, MD Medical Director [facility] Pager: [phone] 02/25/2014 3:50 PM   To contact Stroke Continuity provider, please refer to [email or web address] After hours, contact General Neurology

## 2014-02-25 NOTE — Progress Notes (Signed)
Medicare Important Message given?  YES (If response is "NO", the following Medicare IM given date fields will be blank) Date Medicare IM given:  02/25/2014 Medicare IM given by:  Annabelle Rexroad  

## 2014-02-25 NOTE — Evaluation (Signed)
Speech Language Pathology Evaluation Patient Details Name: [name] MRN: [id number] DOB: March 04, 1929 Today's Date: 02/25/2014 Time:  - 10:18-10:38    Problem List:  Patient Active Problem List   Diagnosis Date Noted  . Subarachnoid hemorrhage following injury with brief loss of consciousness but without open intracranial wound 02/22/2014  . Syncope and collapse 02/22/2014  . Hypertensive urgency 02/22/2014  . Coronary atherosclerosis of native coronary artery 07/27/2013  . Obstructive sleep apnea on CPAP 02/26/2013  . Obesity (BMI 30-39.9) 02/26/2013  . Myocardial infarction   . Rhinitis   . Osteopenia   . Rotator cuff syndrome   . Hypercholesteremia   . Hyperlipemia   . Anemia   . GERD (gastroesophageal reflux disease)   . Breast cancer 08/05/2011  . Fracture of femoral neck, right 06/13/2011  . Fracture of humeral head, right, closed 06/13/2011  . HTN (hypertension) 06/13/2011  . Postop Acute blood loss anemia 06/07/2011  . Postop Hyponatremia 06/07/2011  . Postop Transfusion history 06/07/2011  . Coronary artery disease 06/24/2006   Past Medical History:  Past Medical History  Diagnosis Date  . Hypertension   . Myocardial infarction   . Postop Acute blood loss anemia   . Postop Hyponatremia   . Postop Transfusion history   . Breast cancer   . Rhinitis   . Osteopenia   . Rotator cuff syndrome   . Hypercholesteremia   . Hyperlipemia   . Anemia   . GERD (gastroesophageal reflux disease)   . Hemorrhoids   . Colon polyps   . Macular degeneration   . Umbilical hernia   . Osteopenia   . Obstructive sleep apnea on CPAP   . Ulnar neuropathy   . Coronary artery disease 06/2006    STEMI inferior with BMS to mid RCA with mid basal inferior wall HK and luminal irrgularities elsewhere   Past Surgical History:  Past Surgical History  Procedure Laterality Date  . Shoulder surgery    . Breast lumpectomy    . Wrist surgery    . Hip arthroplasty  06/06/2011     Procedure: ARTHROPLASTY BIPOLAR HIP;  Surgeon: Gearlean Alf;  Location: [facility] ORS;  Service: Orthopedics;  Laterality: Right;  . Coronary angioplasty with stent placement  06/2006    Mid RCA BMS   HPI:  [name] is an 80 y.o. female history of hypertension, coronary artery disease with stent placement, myocardial infarction, hyperlipidemia, and macular degeneration, brought to the emergency room following an episode of loss of consciousness. Patient does not recall any events immediately prior to losing consciousness. No evidence of trauma was seen. CT scan of her head showed a small area of intraparenchymal and subarachnoid hemorrhage involving the right parasagittal frontal area. Per neuro notes, this is SAH secondary to trauma, no CVA.    Assessment / Plan / Recommendation Clinical Impression  Cognitive-linguistic evaluation complete. Cognitive-linguistic function appears at baseline and normal for return to previous living situation. No SLP f/u indicated at this time. Educated patient and son on potential residual affects of small TBI (memory, attention, problem solving). Do not anticipate patient will have needs after d/c however educated to contact PCP if deficits noted for OP or [facility] SLP consult.     SLP Assessment  Patient does not need any further Speech Lanaguage Pathology Services    Follow Up Recommendations  None    Frequency and Duration        Pertinent Vitals/Pain Pain Assessment: No/denies pain   SLP Goals  SLP Evaluation Prior Functioning  Cognitive/Linguistic Baseline: Within functional limits Type of Home:  ([facility])  Lives With: Spouse   Cognition  Overall Cognitive Status: Within Functional Limits for tasks assessed Orientation Level: Oriented X4    Comprehension  Auditory Comprehension Overall Auditory Comprehension: Appears within functional limits for tasks assessed Visual Recognition/Discrimination Discrimination: Within Function  Limits Reading Comprehension Reading Status: Not tested    Expression Expression Primary Mode of Expression: Verbal Verbal Expression Overall Verbal Expression: Appears within functional limits for tasks assessed   Oral / Motor Oral Motor/Sensory Function Overall Oral Motor/Sensory Function: Appears within functional limits for tasks assessed Motor Speech Overall Motor Speech: Appears within functional limits for tasks assessed   GO    Gabriel Rainwater MA, CCC-SLP [phone]  Christelle Igoe Meryl 02/25/2014, 10:43 AM

## 2014-02-26 ENCOUNTER — Inpatient Hospital Stay (HOSPITAL_COMMUNITY): Payer: Medicare Other

## 2014-02-26 DIAGNOSIS — I441 Atrioventricular block, second degree: Principal | ICD-10-CM

## 2014-02-26 NOTE — Discharge Summary (Addendum)
ELECTROPHYSIOLOGY PROCEDURE DISCHARGE SUMMARY    Patient ID: [name],  MRN: [id number], DOB/AGE: February 03, 1929 80 y.o.  Admit date: 02/22/2014 Discharge date: 02/26/2014  Primary Care Physician: Henrine Screws, MD Primary Cardiologist: Radford Pax Electrophysiologist: Lovena Le  Primary Discharge Diagnosis:  [name] attacks due to transient CHB, with persistent symptomatic 2:1 heart block, status post pacemaker implantation this admission  Secondary Discharge Diagnosis:  1.  Hypertension 2.  CAD s/p BMS to RCA 3.  Hyperlipidemia 4.  Sleep apnea - on CPAP 5.  LBBB  Allergies  Allergen Reactions  . Chlorpheniramine Anaphylaxis  . Codeine Anaphylaxis   Consults: Neurology   Procedures This Admission:  1.  Implantation of a MDT dual chamber pacemaker on 02-25-2014 by Dr Lovena Le.  See op note for full details.  There were no early apparent complications.  2.  CXR on 02-26-2014 demonstrated no pneumothorax status post device implant  Brief HPI: [name] is a 80 y.o. female with a past medical history significant for hypertension, CAD (s/p BMS to mid RCA), hyperlipidemia, sleep apnea (on CPAP), and LBBB.  On the day of admission, she had an episode of loss of consciousness without prodrome.  On arrival to the ER she was found to be in 2:1 heart block.  She was admitted for further evaluation.    Hospital Course: The patient was admitted to the hospital and found to have a small intracranial hemorrhage. This remained stable. She was evaluated by neurology who recommended stopping Plavix.  She had been on Bystolic at home which was held. After 48 hours, the patient had persistent symptomatic Mobitz 2 second-degree AV block, and underwent permanent pacemaker insertion.  CXR post pacemaker implantation demonstrated no pneumothorax.  Her left chest was without hematoma.  Device was interrogated and found to be functioning normally.  Post procedure she was stable, but  had difficulty with standing, balance, and ambulation. She will therefore return to [facility] prior to returning to independent living.   Discharge Vitals: Blood pressure 154/55, pulse 73, temperature 98.2 F (36.8 C), temperature source Oral, resp. rate 18, height 5' (1.524 m), weight 216 lb 4.8 oz (98.113 kg), SpO2 95.00%.   Labs:   Lab Results  Component Value Date   WBC 11.0* 02/22/2014   HGB 13.9 02/22/2014   HCT 40.7 02/22/2014   MCV 88.7 02/22/2014   PLT 221 02/22/2014     Recent Labs Lab 02/22/14 2051  NA 131*  K 4.0  CL 92*  CO2 23  BUN 12  CREATININE 0.59  CALCIUM 9.0  GLUCOSE 123*     Discharge Medications:    Medication List    STOP taking these medications       clopidogrel 75 MG tablet  Commonly known as:  PLAVIX      TAKE these medications       cholecalciferol 1000 UNITS tablet  Commonly known as:  VITAMIN D  Take 1,000 Units by mouth at bedtime.     CoQ10 200 MG Caps  Take 1 tablet by mouth daily.     ezetimibe 10 MG tablet  Commonly known as:  ZETIA  Take 10 mg by mouth 2 (two) times a week. Mondays and fridays     lisinopril 20 MG tablet  Commonly known as:  PRINIVIL,ZESTRIL  Take 20 mg by mouth daily.     nebivolol 5 MG tablet  Commonly known as:  BYSTOLIC  Take 5 mg by mouth daily.  nitroGLYCERIN 0.4 MG SL tablet  Commonly known as:  NITROSTAT  Place 0.4 mg under the tongue every 5 (five) minutes as needed for chest pain.     PRESERVISION/LUTEIN Caps  Take 1 capsule by mouth 2 (two) times daily.     rosuvastatin 5 MG tablet  Commonly known as:  CRESTOR  Take 5 mg by mouth 2 (two) times a week. Tuesday and thursdays        Disposition:  I would like for her to go to [facility] for rehab as noted by OT.   Follow-up Information   Follow up with SETHI,PRAMOD, MD. Schedule an appointment as soon as possible for a visit in 1 month. (Stroke Clinic)    Specialties:  Neurology, Radiology   Contact  information:   [address] [place] [address] [phone]       Follow up with [facility]. (03/06/14 at 4:30pm for wound check in the pacemaker clinic)    Specialty:  Cardiology   Contact information:   [address] [place] [address] [phone]      Follow up with Sueanne Margarita, MD. (03/08/14 at 11:15am (as previously scheduled))    Specialty:  Cardiology   Contact information:   [address] [address] [phone]       Follow up with Cristopher Peru, MD In 3 months. (we will arrange and contact you.)    Specialty:  Cardiology   Contact information:   [address] [place] [address] [phone]       Follow up with Henrine Screws, MD. (as scheduled.)    Specialty:  Internal Medicine   Contact information:   [address] [place] [address] [place] [address] [phone]       Duration of Discharge Encounter: Greater than 30 minutes including physician time.  Signed,  Cristopher Peru, M.D.

## 2014-02-26 NOTE — Progress Notes (Signed)
CSW (Clinical Education officer, museum) spoke with Rosendo Gros at [facility] who confirmed that pt can dc to ALF today. CSW notified that pt is requesting they provide wheelchair Lucianne Lei transport. Facility confirmed they will pick up pt at her room between 3pm-4pm. CSW notified pt and pt son at bedside. Pt said she was very comfortable and happy with this plan. CSW notified pt nurse of plan. CSW signing off.  [place], [place]

## 2014-02-26 NOTE — Care Management Note (Signed)
    Page 1 of 1   02/26/2014     11:27:42 AM CARE MANAGEMENT NOTE 02/26/2014  Patient:  ZOEE, HEENEY   Account Number:  [id number]  Date Initiated:  02/25/2014  Documentation initiated by:  [facility]  Subjective/Objective Assessment:   syncope,  right frontal lobe SAH     Action/Plan:   Anticipated DC Date:     Anticipated DC Plan:  [place]  CM consult      Choice offered to / List presented to:             Status of service:  Completed, signed off Medicare Important Message given?  YES (If response is "NO", the following Medicare IM given date fields will be blank) Date Medicare IM given:  02/25/2014 Medicare IM given by:  [facility] Date Additional Medicare IM given:   Additional Medicare IM given by:    Discharge Disposition:  [place]  Per UR Regulation:  Reviewed for med. necessity/level of care/duration of stay  If discussed at [place] of Stay Meetings, dates discussed:    Comments:  02-26-14 [place], [place] [phone] Plan for D/c to ALF today.

## 2014-02-26 NOTE — Progress Notes (Signed)
Occupational Therapy Treatment Patient Details Name: Nayah Lukens MRN: [id number] DOB: [birth year] Today's Date: 02/26/2014    History of present illness HPI: Shanaiya Bene is an 80 y.o. female history of hypertension, coronary artery disease with stent placement, myocardial infarction, hyperlipidemia, and macular degeneration, brought to the emergency room following an episode of loss of consciousness and fall resulting in + CT scan of her head showed a small area of intraparenchymal and subarachnoid hemorrhage involving the right parasagittal frontal area. Patient's also complaining of subscapular pain on the right. Plan for pacemeaker placement 10/5   OT comments  Patient found seated EOB with granddaughter present. Patient eager and receptive to therapy. Therapist educated patient on pacemaker precautions secondary to recent pacemaker placement. Therapist then worked with patient on sit<>stands adhering to precautions, functional mobility adhering to precautions, functional (toilet) transfer adhering to precautions, and patient's overall activity tolerance/endurance. Patient eager to discharge to an ALF close to home for additional rehab. Recommending additional OT to enhance patient's overall independence with BADLs/IADLs and overall quality of life. Patient will need 24/7 assistance once discharged.    Follow Up Recommendations  Supervision/Assistance - 24 hour (ALF)    Equipment Recommendations  None recommended by OT    Recommendations for Other Services  none at this time    Precautions / Restrictions Precautions Precautions: Fall Precaution Comments: monitor for presyncopal symptoms; posterior hip replacemetn @ 2 months ago Required Braces or Orthoses: Sling (prn for pacemaker precautions) Restrictions Weight Bearing Restrictions: Yes LUE Weight Bearing:  (pacemaker precautions)          Balance Overall balance assessment: Needs assistance, patient currently has  pacemaker precautions which limit her dynamic standing balance without UE support.     ADL Toilet Transfer: Minimal assistance;Cueing for safety;BSC (using cane in L hand)                Cognition   Behavior During Therapy: WFL for tasks assessed/performed Overall Cognitive Status: Within Functional Limits for tasks assessed                 Pertinent Vitals/ Pain       Pain Assessment: No/denies pain         Frequency Min 2X/week     Progress Toward Goals  OT Goals(current goals can now be found in the care plan section)  Progress towards OT goals: Progressing toward goals  Plan Discharge plan remains appropriate       End of Session Equipment Utilized During Treatment: Gait belt (single point cane secondary to pacemaker precautions)   Activity Tolerance Patient tolerated treatment well   Patient Left in bed;with call bell/phone within reach;with family/visitor present         Time: 1450-1520 OT Time Calculation (min): 30 min  Charges: OT General Charges $OT Visit: 1 Procedure OT Treatments $Self Care/Home Management : 8-22 mins  Terius Jacuinde , MS, OTR/L, CLT 02/26/2014, 3:24 PM

## 2014-02-26 NOTE — Progress Notes (Signed)
Clinical Social Work Department BRIEF PSYCHOSOCIAL ASSESSMENT 02/26/2014  Patient:  [name], [name]     Account Number:  [id number]     Admit date:  02/22/2014  Clinical Social Worker:  Adair Laundry  Date/Time:  02/26/2014 10:00 AM  Referred by:  Physician  Date Referred:  02/26/2014 Referred for  SNF Placement   Other Referral:   Interview type:  Patient Other interview type:   Spoke with pt and pt son at bedside    PSYCHOSOCIAL DATA Living Status:  FACILITY Admitted from facility:  [facility] Level of care:  Independent Living Primary support name:  [name] Primary support relationship to patient:  SPOUSE Degree of support available:   Pt has good family support    CURRENT CONCERNS Current Concerns  Post-Acute Placement   Other Concerns:    SOCIAL WORK ASSESSMENT / PLAN CSW informed that pt was admitted from independent living but will need high level of care at dc. CSW visited pt room and spoke with pt and pt son about recommendation. Pt informed CSW she lives at [facility] and the plan will be for her to return to [facility] for a short time. Pt son informed CSW that they have already spoken with facility about this but facility is awaiting hospital paperwor. CSW explained role and how CSW can assist with this. Pt expressed that she is ready for discharge and thankful that she lives in a community that allows her to receive more assistance as needed.    CSW sent clinicals electronically to facility. CSW left voicemail for pt independent living nurse and awaiting return phone call.   Assessment/plan status:  Psychosocial Support/Ongoing Assessment of Needs Other assessment/ plan:   Information/referral to community resources:   None needed    PATIENT'S/FAMILY'S RESPONSE TO PLAN OF CARE: Pt and pt family hoping for pt to move short term to assisted living       [place], [place]

## 2014-02-26 NOTE — Progress Notes (Addendum)
Clinical Social Work Department CLINICAL SOCIAL WORK PLACEMENT NOTE 02/26/2014  Patient:  [name], [name]  Account Number:  [id number] Admit date:  02/22/2014  Clinical Social Worker:  Adair Laundry  Date/time:  02/26/2014 10:45 AM  Clinical Social Work is seeking post-discharge placement for this patient at the following level of care:   ASSISTED LIVING/REST HOME   (*CSW will update this form in Epic as items are completed)   02/26/2014  Patient/family provided with [facility] Department of Clinical Social Work's list of facilities offering this level of care within the geographic area requested by the patient (or if unable, by the patient's family).  02/26/2014  Patient/family informed of their freedom to choose among providers that offer the needed level of care, that participate in Medicare, Medicaid or managed care program needed by the patient, have an available bed and are willing to accept the patient.  02/26/2014  Patient/family informed of [facility]' ownership interest in [facility], as well as of the fact that they are under no obligation to receive care at this facility.  PASARR submitted to EDS on  PASARR number received on   FL2 transmitted to all facilities in geographic area requested by pt/family on  02/26/2014 FL2 transmitted to all facilities within larger geographic area on   Patient informed that his/her managed care company has contracts with or will negotiate with  certain facilities, including the following:     Patient/family informed of bed offers received:  02/26/2014 Patient chooses bed at [facility] Physician recommends and patient chooses bed at    Patient to be transferred to  [facility]  02/26/2014 Patient to be transferred to facility by [place] Patient and family notified of transfer on  02/26/2014 Name of family member notified:  Octavia Heir (son)  The following physician request were entered  in Epic: Physician Request  Please sign FL2.    Additional Comments: PASARR not required by facility  [facility], [place]

## 2014-02-27 DIAGNOSIS — T82198S Other mechanical complication of other cardiac electronic device, sequela: Secondary | ICD-10-CM | POA: Diagnosis not present

## 2014-02-27 DIAGNOSIS — M6281 Muscle weakness (generalized): Secondary | ICD-10-CM | POA: Diagnosis not present

## 2014-02-27 DIAGNOSIS — M545 Low back pain: Secondary | ICD-10-CM | POA: Diagnosis not present

## 2014-02-27 DIAGNOSIS — R55 Syncope and collapse: Secondary | ICD-10-CM | POA: Diagnosis not present

## 2014-02-27 DIAGNOSIS — R2681 Unsteadiness on feet: Secondary | ICD-10-CM | POA: Diagnosis not present

## 2014-02-27 DIAGNOSIS — T82598S Other mechanical complication of other cardiac and vascular devices and implants, sequela: Secondary | ICD-10-CM | POA: Diagnosis not present

## 2014-02-27 DIAGNOSIS — I629 Nontraumatic intracranial hemorrhage, unspecified: Secondary | ICD-10-CM | POA: Diagnosis not present

## 2014-02-27 DIAGNOSIS — Z950 Presence of cardiac pacemaker: Secondary | ICD-10-CM | POA: Diagnosis not present

## 2014-02-27 DIAGNOSIS — R279 Unspecified lack of coordination: Secondary | ICD-10-CM | POA: Diagnosis not present

## 2014-02-27 DIAGNOSIS — R296 Repeated falls: Secondary | ICD-10-CM | POA: Diagnosis not present

## 2014-02-28 DIAGNOSIS — M545 Low back pain: Secondary | ICD-10-CM | POA: Diagnosis not present

## 2014-02-28 DIAGNOSIS — I629 Nontraumatic intracranial hemorrhage, unspecified: Secondary | ICD-10-CM | POA: Diagnosis not present

## 2014-02-28 DIAGNOSIS — R279 Unspecified lack of coordination: Secondary | ICD-10-CM | POA: Diagnosis not present

## 2014-02-28 DIAGNOSIS — M6281 Muscle weakness (generalized): Secondary | ICD-10-CM | POA: Diagnosis not present

## 2014-02-28 DIAGNOSIS — R2681 Unsteadiness on feet: Secondary | ICD-10-CM | POA: Diagnosis not present

## 2014-02-28 DIAGNOSIS — T82598S Other mechanical complication of other cardiac and vascular devices and implants, sequela: Secondary | ICD-10-CM | POA: Diagnosis not present

## 2014-03-01 ENCOUNTER — Non-Acute Institutional Stay (SKILLED_NURSING_FACILITY): Payer: Medicare Other | Admitting: Internal Medicine

## 2014-03-01 ENCOUNTER — Telehealth: Payer: Self-pay | Admitting: Cardiology

## 2014-03-01 DIAGNOSIS — M6281 Muscle weakness (generalized): Secondary | ICD-10-CM | POA: Diagnosis not present

## 2014-03-01 DIAGNOSIS — I609 Nontraumatic subarachnoid hemorrhage, unspecified: Secondary | ICD-10-CM

## 2014-03-01 DIAGNOSIS — R2681 Unsteadiness on feet: Secondary | ICD-10-CM | POA: Diagnosis not present

## 2014-03-01 DIAGNOSIS — Z950 Presence of cardiac pacemaker: Secondary | ICD-10-CM

## 2014-03-01 DIAGNOSIS — G4733 Obstructive sleep apnea (adult) (pediatric): Secondary | ICD-10-CM

## 2014-03-01 DIAGNOSIS — R279 Unspecified lack of coordination: Secondary | ICD-10-CM | POA: Diagnosis not present

## 2014-03-01 DIAGNOSIS — M545 Low back pain: Secondary | ICD-10-CM | POA: Diagnosis not present

## 2014-03-01 DIAGNOSIS — Z9989 Dependence on other enabling machines and devices: Secondary | ICD-10-CM

## 2014-03-01 DIAGNOSIS — I459 Conduction disorder, unspecified: Secondary | ICD-10-CM

## 2014-03-01 DIAGNOSIS — T82598S Other mechanical complication of other cardiac and vascular devices and implants, sequela: Secondary | ICD-10-CM | POA: Diagnosis not present

## 2014-03-01 DIAGNOSIS — I10 Essential (primary) hypertension: Secondary | ICD-10-CM

## 2014-03-01 DIAGNOSIS — I629 Nontraumatic intracranial hemorrhage, unspecified: Secondary | ICD-10-CM | POA: Diagnosis not present

## 2014-03-01 NOTE — Telephone Encounter (Signed)
New message    Need clarification on medication Bystolic  5 mg  Discharge summary sign by Dr. Lovena Le.

## 2014-03-01 NOTE — Telephone Encounter (Signed)
Cannot find a discharge summary in patient's chart clarify Bystolic dose. They will fax this to [place].

## 2014-03-03 ENCOUNTER — Encounter: Payer: Self-pay | Admitting: Internal Medicine

## 2014-03-03 DIAGNOSIS — R2681 Unsteadiness on feet: Secondary | ICD-10-CM | POA: Insufficient documentation

## 2014-03-03 MED ORDER — AMLODIPINE BESYLATE 5 MG PO TABS: ORAL_TABLET | ORAL | Status: DC

## 2014-03-03 NOTE — Progress Notes (Signed)
Patient ID: [name], female   DOB: September 03, 1928, 80 y.o.   MRN: [id number]    HISTORY AND PHYSICAL  Location:  [facility] Room Number: 146 Place of Service: SNF (31)   Extended Emergency Contact Information Primary Emergency Contact: Weichert,James E Address: [place], [place] Phone: [phone] Relation: Spouse Secondary Emergency Contact: [place] Phone: [phone] Mobile Phone: [phone] Relation: Son   Chief Complaint  Patient presents with  . New Admit To SNF    HPI:  This 80 year old female had a [place] attack, fell, and was hospitalized 02/22/14 through 02/26/14. In the course of her evaluation it was found that she had a subarachnoid hemorrhage. She also received a pacemaker implant on 02/25/14. Patient had some elevation of blood pressure, but because of the [employer] attack, it was felt that Bystolic should be stopped. She remained on lisinopril.  Following stabilization, she was sent to skilled nursing facility for further rehabilitation prior to discharge to home.  Since [facility], her blood pressures have been high. Patient has otherwise been doing well. She remains unstable when walking and is using a walker. She denies headache, nausea, or palpitations. The new pacemaker site is healing well.  The patient is here for short-term rehabilitation. She has already been evaluated by physical therapy and occupational therapy and is under treatment.  Past Medical History  Diagnosis Date  . Hypertension   . Myocardial infarction   . Breast cancer   . Rhinitis   . Osteopenia   . Rotator cuff syndrome   . Hyperlipemia   . GERD (gastroesophageal reflux disease)   . Hemorrhoids   . Colon polyps   . Macular degeneration   . Umbilical hernia   . Osteopenia   . Obstructive sleep apnea on  CPAP   . Ulnar neuropathy   . Coronary artery disease 06/2006    STEMI inferior with BMS to mid RCA with mid basal inferior wall HK and luminal irrgularities elsewhere  . 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 Pacemaker 02/25/14  . SAH (subarachnoid hemorrhage) 02/22/14  . LBBB (left bundle branch block)   . Unstable gait   . Fracture of femoral neck, right 06/13/2011  . Fracture of humeral head, right, closed 06/13/2011    Past Surgical History  Procedure Laterality Date  . Shoulder surgery    . Breast lumpectomy    . Wrist surgery    . Hip arthroplasty  06/06/2011    Procedure: ARTHROPLASTY BIPOLAR HIP;  Surgeon: Gearlean Alf;  Location: [facility];  Service: Orthopedics;  Laterality: Right;  . Coronary angioplasty with stent placement  06/2006    Mid RCA BMS    Patient Care Team: Josetta Huddle, MD as PCP - General (Internal Medicine) Marin Shutter, MD as Consulting Physician (Orthopedic Surgery) Gearlean Alf, MD as Consulting Physician (Orthopedic Surgery) Antony Contras, MD as Consulting Physician (Neurology) Evans Lance, MD as Consulting Physician (Cardiology) Jettie Booze, MD as Consulting Physician (Interventional Cardiology) Sueanne Margarita, MD as Consulting Physician (Cardiology) Rob Hickman, MD as Consulting Physician (Ophthalmology)  History   Social History  . Marital Status: Married    Spouse Name: N/A    Number of Children: N/A  . Years of Education: N/A   Occupational History  . Not on file.  Social History Main Topics  . Smoking status: Never Smoker   . Smokeless tobacco: Not on file  . Alcohol Use: Yes     Comment: occasional wine  . Drug Use: Not on file  . Sexual Activity: Not on file   Other Topics Concern  . Not on file   Social History Narrative  . No narrative on file     reports that she has never smoked. She does not have any smokeless tobacco history on file. She reports that she drinks alcohol. Her drug history is not on  file.   There is no immunization history on file for this patient.  Allergies  Allergen Reactions  . Chlorpheniramine Anaphylaxis  . Codeine Anaphylaxis    Medications: Patient's Medications  New Prescriptions   No medications on file  Previous Medications   CHOLECALCIFEROL (VITAMIN D) 1000 UNITS TABLET    Take 1,000 Units by mouth at bedtime.   COENZYME Q10 (COQ10) 200 MG CAPS    Take 1 tablet by mouth daily.   EZETIMIBE (ZETIA) 10 MG TABLET    Take 10 mg by mouth 2 (two) times a week. Mondays and fridays   LISINOPRIL (PRINIVIL,ZESTRIL) 20 MG TABLET    Take 20 mg by mouth daily.   MULTIPLE VITAMINS-MINERALS (PRESERVISION/LUTEIN) CAPS    Take 1 capsule by mouth 2 (two) times daily.   NITROGLYCERIN (NITROSTAT) 0.4 MG SL TABLET    Place 0.4 mg under the tongue every 5 (five) minutes as needed for chest pain.   ROSUVASTATIN (CRESTOR) 5 MG TABLET    Take 5 mg by mouth 2 (two) times a week. Tuesday and thursdays  Modified Medications   No medications on file  Discontinued Medications   NEBIVOLOL (BYSTOLIC) 5 MG TABLET    Take 5 mg by mouth daily.     Review of Systems  Constitutional: Negative for fever, chills, diaphoresis, activity change, appetite change, fatigue and unexpected weight change.       Obese  HENT: Negative.  Negative for congestion, ear discharge, ear pain, hearing loss, postnasal drip, rhinorrhea, sore throat, tinnitus, trouble swallowing and voice change.   Eyes: Negative for pain, redness, itching and visual disturbance.       Corrective lenses  Respiratory: Negative for cough, choking, shortness of breath and wheezing.   Cardiovascular: Positive for leg swelling (1-2+ pedal edema bilaterally). Negative for chest pain and palpitations.       Pacemaker implant in left upper chest on 02/25/14.  Gastrointestinal: Negative for nausea, abdominal pain, diarrhea, constipation and abdominal distention.  Endocrine: Negative for cold intolerance, heat intolerance,  polydipsia, polyphagia and polyuria.  Genitourinary: Negative for dysuria, urgency, frequency, hematuria, flank pain, vaginal discharge, difficulty urinating and pelvic pain.  Musculoskeletal: Negative for arthralgias, back pain, gait problem, myalgias, neck pain and neck stiffness.  Skin: Negative for color change, pallor and rash.  Allergic/Immunologic: Negative.   Neurological: Negative for dizziness, tremors, seizures, syncope, weakness, numbness and headaches.  Hematological: Negative for adenopathy. Does not bruise/bleed easily.  Psychiatric/Behavioral: Negative for suicidal ideas, hallucinations, behavioral problems, confusion, sleep disturbance, dysphoric mood and agitation. The patient is not nervous/anxious and is not hyperactive.     Filed Vitals:   03/03/14 1124  BP: 178/99  Pulse: 92  Temp: 97 F (36.1 C)  Resp: 20  Height: 5' (1.524 m)  Weight: 216 lb (97.977 kg)  SpO2: 96%   Body mass index is 42.18 kg/(m^2).  Physical Exam  Constitutional: She is oriented to person, place,  and time. She appears well-developed and well-nourished. No distress.  Obese  HENT:  Right Ear: External ear normal.  Left Ear: External ear normal.  Nose: Nose normal.  Mouth/Throat: Oropharynx is clear and moist. No oropharyngeal exudate.  Eyes: Conjunctivae and EOM are normal. Pupils are equal, round, and reactive to light. No scleral icterus.  Corrective lenses  Neck: No JVD present. No tracheal deviation present. No thyromegaly present.  Cardiovascular: Normal rate, regular rhythm and normal heart sounds.  Exam reveals no gallop and no friction rub.   No murmur heard. Diminished dorsalis pedis and posterior tibial pulses bilaterally.  Pulmonary/Chest: Effort normal. No respiratory distress. She has no wheezes. She has no rales. She exhibits no tenderness.  Abdominal: She exhibits no distension and no mass. There is no tenderness.  Musculoskeletal: Normal range of motion. She exhibits  edema (1-2+ bipedal edema). She exhibits no tenderness.  Lymphadenopathy:    She has no cervical adenopathy.  Neurological: She is alert and oriented to person, place, and time. No cranial nerve deficit. Coordination normal.  Skin: No rash noted. She is not diaphoretic. No erythema. No pallor.  Psychiatric: She has a normal mood and affect. Her behavior is normal. Judgment and thought content normal.     Labs reviewed: Admission on 02/22/2014, Discharged on 02/26/2014  Component Date Value Ref Range Status  . Color, Urine 02/22/2014 YELLOW  YELLOW Final  . APPearance 02/22/2014 CLOUDY* CLEAR Final  . Specific Gravity, Urine 02/22/2014 1.010  1.005 - 1.030 Final  . pH 02/22/2014 7.5  5.0 - 8.0 Final  . Glucose, UA 02/22/2014 NEGATIVE  NEGATIVE mg/dL Final  . Hgb urine dipstick 02/22/2014 NEGATIVE  NEGATIVE Final  . Bilirubin Urine 02/22/2014 NEGATIVE  NEGATIVE Final  . Ketones, ur 02/22/2014 15* NEGATIVE mg/dL Final  . Protein, ur 02/22/2014 30* NEGATIVE mg/dL Final  . Urobilinogen, UA 02/22/2014 0.2  0.0 - 1.0 mg/dL Final  . Nitrite 02/22/2014 NEGATIVE  NEGATIVE Final  . Leukocytes, UA 02/22/2014 NEGATIVE  NEGATIVE Final  . WBC 02/22/2014 11.0* 4.0 - 10.5 K/uL Final  . RBC 02/22/2014 4.59  3.87 - 5.11 MIL/uL Final  . Hemoglobin 02/22/2014 13.9  12.0 - 15.0 g/dL Final  . HCT 02/22/2014 40.7  36.0 - 46.0 % Final  . MCV 02/22/2014 88.7  78.0 - 100.0 fL Final  . MCH 02/22/2014 30.3  26.0 - 34.0 pg Final  . MCHC 02/22/2014 34.2  30.0 - 36.0 g/dL Final  . RDW 02/22/2014 12.6  11.5 - 15.5 % Final  . Platelets 02/22/2014 221  150 - 400 K/uL Final  . Neutrophils Relative % 02/22/2014 80* 43 - 77 % Final  . Neutro Abs 02/22/2014 8.8* 1.7 - 7.7 K/uL Final  . Lymphocytes Relative 02/22/2014 10* 12 - 46 % Final  . Lymphs Abs 02/22/2014 1.1  0.7 - 4.0 K/uL Final  . Monocytes Relative 02/22/2014 9  3 - 12 % Final  . Monocytes Absolute 02/22/2014 1.0  0.1 - 1.0 K/uL Final  . Eosinophils  Relative 02/22/2014 1  0 - 5 % Final  . Eosinophils Absolute 02/22/2014 0.1  0.0 - 0.7 K/uL Final  . Basophils Relative 02/22/2014 0  0 - 1 % Final  . Basophils Absolute 02/22/2014 0.0  0.0 - 0.1 K/uL Final  . Sodium 02/22/2014 131* 137 - 147 mEq/L Final  . Potassium 02/22/2014 4.0  3.7 - 5.3 mEq/L Final  . Chloride 02/22/2014 92* 96 - 112 mEq/L Final  . CO2 02/22/2014 23  19 -  32 mEq/L Final  . Glucose, Bld 02/22/2014 123* 70 - 99 mg/dL Final  . BUN 02/22/2014 12  6 - 23 mg/dL Final  . Creatinine, Ser 02/22/2014 0.59  0.50 - 1.10 mg/dL Final  . Calcium 02/22/2014 9.0  8.4 - 10.5 mg/dL Final  . GFR calc non Af Amer 02/22/2014 83* >90 mL/min Final  . GFR calc Af Amer 02/22/2014 >90  >90 mL/min Final   Comment: (NOTE)                          The eGFR has been calculated using the CKD EPI equation.                          This calculation has not been validated in all clinical situations.                          eGFR's persistently <90 mL/min signify possible Chronic Kidney                          Disease.  . Anion gap 02/22/2014 16* 5 - 15 Final  . aPTT 02/22/2014 33  24 - 37 seconds Final  . Prothrombin Time 02/22/2014 13.0  11.6 - 15.2 seconds Final  . INR 02/22/2014 0.98  0.00 - 1.49 Final  . Squamous Epithelial / LPF 02/22/2014 FEW* RARE Final  . WBC, UA 02/22/2014 0-2  <3 WBC/hpf Final  . RBC / HPF 02/22/2014 0-2  <3 RBC/hpf Final  . Bacteria, UA 02/22/2014 FEW* RARE Final  . Urine-Other 02/22/2014 AMORPHOUS URATES/PHOSPHATES   Final  . MRSA by PCR 02/23/2014 NEGATIVE  NEGATIVE Final   Comment:                                 The GeneXpert MRSA Assay (FDA                          approved for NASAL specimens                          only), is one component of a                          comprehensive MRSA colonization                          surveillance program. It is not                          intended to diagnose MRSA                          infection nor to guide or                           monitor treatment for                          MRSA infections.  . Glucose-Capillary 02/24/2014 105* 70 - 99 mg/dL Final  . Glucose-Capillary 02/24/2014 103* 70 - 99 mg/dL Final  .  Glucose-Capillary 02/25/2014 107* 70 - 99 mg/dL Final  . Glucose-Capillary 02/25/2014 120* 70 - 99 mg/dL Final  . Glucose-Capillary 02/25/2014 110* 70 - 99 mg/dL Final  . Comment 1 02/25/2014 Documented in Chart   Final  . Comment 2 02/25/2014 Notify RN   Final  . Glucose-Capillary 02/25/2014 87  70 - 99 mg/dL Final    Dg Chest 2 View  02/26/2014   CLINICAL DATA:  Pacemaker placement.  EXAM: CHEST  2 VIEW  COMPARISON:  02/22/2014  FINDINGS: Do lead left-sided pacemaker is intact. Lungs are somewhat hypoinflated without effusion. There is no evidence of left-sided pneumothorax. Subtle opacification of the lateral left base likely atelectasis. Borderline cardiomegaly unchanged. No calcification over the aortic arch. Remainder the exam is unchanged.  IMPRESSION: Hypoinflation with minimal opacification over the lateral left base likely atelectasis.  Stable borderline cardiomegaly.  Left-sided pacemaker.  No evidence of pneumothorax.   Electronically Signed   By: Marin Olp M.D.   On: 02/26/2014 08:09   Dg Chest 2 View  02/22/2014   CLINICAL DATA:  Loss of consciousness, fall today. Acute shortness of breath. Back pain.  EXAM: CHEST  2 VIEW  COMPARISON:  06/05/2011 and 01/30/2007.  FINDINGS: Trachea is midline. Heart is at the upper limits of normal in size. Biapical pleural thickening. Lungs are clear. No pleural fluid. Flowing anterior osteophytosis in the thoracic spine. Degenerative changes in the right shoulder. Possible resection of the distal left clavicle.  IMPRESSION: No acute findings.   Electronically Signed   By: Lorin Picket M.D.   On: 02/22/2014 21:43   Ct Head Wo Contrast  02/22/2014   CLINICAL DATA:  Subsequent encounter, subarachnoid hemorrhage, on anticoagulation. New  bradycardia.  EXAM: CT HEAD WITHOUT CONTRAST  TECHNIQUE: Contiguous axial images were obtained from the base of the skull through the vertex without intravenous contrast.  COMPARISON:  02/22/2014 at 2019 hr.  FINDINGS: Small amount of hyper attenuation is seen in the sulci along the medial right frontal lobe, unchanged. Question tiny amount of high attenuation along the sulci of the anterior right parietal lobe (image 19). No evidence of an acute infarct, mass lesion, mass effect or hydrocephalus. Atrophy. Periventricular low attenuation. No air-fluid levels in the visualized portions of the paranasal sinuses and mastoid air cells.  IMPRESSION: 1. Acute subarachnoid hemorrhage along the medial right frontal lobe is stable from exam performed at 2019 hr the same day. Question tiny new focus of subarachnoid hemorrhage along the right parietal lobe. 2. Atrophy and chronic microvascular white matter ischemic changes.   Electronically Signed   By: Lorin Picket M.D.   On: 02/22/2014 22:47   Ct Head Wo Contrast  02/22/2014   CLINICAL DATA:  Fall with loss of consciousness.  Evaluate for CVA.  EXAM: CT HEAD WITHOUT CONTRAST  CT NECK WITHOUT CONTRAST  TECHNIQUE: Contiguous axial images were obtained from the base of the skull through the vertex without contrast. Multidetector CT imaging of the neck was performed using the standard protocol without intravenous contrast.  COMPARISON:  None.  FINDINGS: CT HEAD FINDINGS  Ventricles and sulci are appropriate for patient's age. There is a small amount of acute subarachnoid hemorrhage within the right frontal lobe (image 20; series 2). No mass effect. No mass lesion. Bilateral cataract surgery. Mild mucosal thickening of the ethmoid air cells and maxillary sinuses. Calvarium is intact.  Extensive bilateral subcortical and deep white matter hypodensities compatible with chronic small vessel ischemic change. Bilateral basal ganglia lacunar infarcts.  CT NECK FINDINGS   Visualized thyroid is unremarkable. Lung apices are clear. Multilevel degenerative disc and facet disease. Multilevel anterior endplate osteophytosis. Normal anatomic alignment of the cervical spine. Prevertebral soft tissues unremarkable. Cranial cervical junction is intact.  IMPRESSION: 1. Small amount of subarachnoid hemorrhage within the right frontal lobe. 2. Multilevel degenerative change of the cervical spine without evidence for acute fracture. Critical Value/emergent results were called by telephone at the time of interpretation on 02/22/2014 at 9:07 pm to Dr. Evelina Bucy, who verbally acknowledged these results.   Electronically Signed   By: Lovey Newcomer M.D.   On: 02/22/2014 21:13   Ct Cervical Spine Wo Contrast  02/22/2014   CLINICAL DATA:  Fall with loss of consciousness.  Evaluate for CVA.  EXAM: CT HEAD WITHOUT CONTRAST  CT NECK WITHOUT CONTRAST  TECHNIQUE: Contiguous axial images were obtained from the base of the skull through the vertex without contrast. Multidetector CT imaging of the neck was performed using the standard protocol without intravenous contrast.  COMPARISON:  None.  FINDINGS: CT HEAD FINDINGS  Ventricles and sulci are appropriate for patient's age. There is a small amount of acute subarachnoid hemorrhage within the right frontal lobe (image 20; series 2). No mass effect. No mass lesion. Bilateral cataract surgery. Mild mucosal thickening of the ethmoid air cells and maxillary sinuses. Calvarium is intact.  Extensive bilateral subcortical and deep white matter hypodensities compatible with chronic small vessel ischemic change. Bilateral basal ganglia lacunar infarcts.  CT NECK FINDINGS  Visualized thyroid is unremarkable. Lung apices are clear. Multilevel degenerative disc and facet disease. Multilevel anterior endplate osteophytosis. Normal anatomic alignment of the cervical spine. Prevertebral soft tissues unremarkable. Cranial cervical junction is intact.  IMPRESSION: 1. Small  amount of subarachnoid hemorrhage within the right frontal lobe. 2. Multilevel degenerative change of the cervical spine without evidence for acute fracture. Critical Value/emergent results were called by telephone at the time of interpretation on 02/22/2014 at 9:07 pm to Dr. Evelina Bucy, who verbally acknowledged these results.   Electronically Signed   By: Lovey Newcomer M.D.   On: 02/22/2014 21:13   Mr Brain Wo Contrast  02/23/2014   CLINICAL DATA:  Syncopal episode 24 hr ago. Recent fall. Patient anticoagulated, on Plavix.  EXAM: MRI HEAD WITHOUT CONTRAST  TECHNIQUE: Multiplanar, multiecho pulse sequences of the brain and surrounding structures were obtained without intravenous contrast.  COMPARISON:  CT head 02/22/2009.  FINDINGS: Mild gyriform restricted diffusion is seen in the anterior parasagittal frontal cortex on the RIGHT and LEFT, fairly symmetric. This is a nonspecific finding which I suspect represents some type of local effect of subarachnoid blood, but could represent recent epileptiform activity. This pattern is unlikely to represent acute ischemia or cerebritis.  On susceptibility weighted imaging, the area of parenchymal hemorrhage/ subarachnoid hemorrhage is redemonstrated in the RIGHT medial frontal lobe/cingulate gyrus, just above the anterior body corpus callosum. As seen on image 124 series 8, the hemorrhage is curvilinear and measures approximately 5 x 18 mm cross-section. No other areas of hemorrhage are definitely identified. There is no evidence for vascular malformation.  Generalized atrophy is present. There is moderately advanced small vessel disease. No osseous lesions are seen. Pituitary and cerebellar tonsils are unremarkable. Mild cervical spondylosis is incidentally noted. Flow voids in the major intracranial vascular structures are preserved.  Postcontrast imaging was not obtained.  Compared with most recent CT, the area questioned in the RIGHT frontal region over the convexity  is not clearly abnormal.  IMPRESSION:  Area of parenchymal hemorrhage/ subarachnoid hemorrhage is confirmed from recent CT in the RIGHT frontal lobe. Suspect subcortical shearing injury from recent fall, potentiated by anticoagulation with Plavix.  BILATERAL fairly symmetric restricted diffusion in the parasagittal anterior frontal lobes. I believe the most likely consideration is some regional effect of subarachnoid hemorrhage, but cannot exclude recent epileptiform activity. Mild cerebritis or ischemia are less favored.  Atrophy and small vessel disease   Electronically Signed   By: Rolla Flatten M.D.   On: 02/23/2014 16:11     Assessment/Plan  1. Unstable gait And use of walker.  2. SAH (subarachnoid hemorrhage) Observe. Patient is off clopidogrel.  3. Pacemaker Placed. 02/25/2014 . Wound is healing.  4. [place] attack This is the cause for her fall and subsequent subarachnoid hemorrhage. She now has a pacemaker.  5. Hypertension Start Norvasc 5 mg daily     Patient is here for short-term rehabilitation. Goals of therapy are to strengthen her, improved self-care skills, and instructed in safe ambulation. We will control blood pressure while on the rehabilitation unit. She is to see her cardiologist next week.

## 2014-03-04 ENCOUNTER — Telehealth: Payer: Self-pay

## 2014-03-04 DIAGNOSIS — R279 Unspecified lack of coordination: Secondary | ICD-10-CM | POA: Diagnosis not present

## 2014-03-04 DIAGNOSIS — T82598S Other mechanical complication of other cardiac and vascular devices and implants, sequela: Secondary | ICD-10-CM | POA: Diagnosis not present

## 2014-03-04 DIAGNOSIS — R2681 Unsteadiness on feet: Secondary | ICD-10-CM | POA: Diagnosis not present

## 2014-03-04 DIAGNOSIS — M6281 Muscle weakness (generalized): Secondary | ICD-10-CM | POA: Diagnosis not present

## 2014-03-04 DIAGNOSIS — M545 Low back pain: Secondary | ICD-10-CM | POA: Diagnosis not present

## 2014-03-04 DIAGNOSIS — I629 Nontraumatic intracranial hemorrhage, unspecified: Secondary | ICD-10-CM | POA: Diagnosis not present

## 2014-03-04 NOTE — Telephone Encounter (Signed)
Patient's discharge summary indicates that Bystolic has been discontinued. she was seen this past weekend by Dr. Loni Muse green.

## 2014-03-04 NOTE — Telephone Encounter (Signed)
Spoke with [name], the patient's son about her upcoming appointments here at [facility]. They would like to combine her CPAP check with Dr. Radford Pax on 03-08-14, with her Pacer and Wound Check on that day due to the difficulty of getting her out and about recently. She will need to continue to follow Dr. Irish Lack for her Cardiology care, and per Dr. Irish Lack, he can see her once per year unless otherwise needed. Please have this patient make an appointment to see Dr. Irish Lack for her one year follow up while she is here on 03-08-14. Please also let me know when this patient is roomed on Friday so I can meet her.

## 2014-03-05 DIAGNOSIS — R2681 Unsteadiness on feet: Secondary | ICD-10-CM | POA: Diagnosis not present

## 2014-03-05 DIAGNOSIS — R279 Unspecified lack of coordination: Secondary | ICD-10-CM | POA: Diagnosis not present

## 2014-03-05 DIAGNOSIS — I629 Nontraumatic intracranial hemorrhage, unspecified: Secondary | ICD-10-CM | POA: Diagnosis not present

## 2014-03-05 DIAGNOSIS — T82598S Other mechanical complication of other cardiac and vascular devices and implants, sequela: Secondary | ICD-10-CM | POA: Diagnosis not present

## 2014-03-05 DIAGNOSIS — M6281 Muscle weakness (generalized): Secondary | ICD-10-CM | POA: Diagnosis not present

## 2014-03-05 DIAGNOSIS — M545 Low back pain: Secondary | ICD-10-CM | POA: Diagnosis not present

## 2014-03-06 ENCOUNTER — Ambulatory Visit: Payer: Medicare Other

## 2014-03-06 DIAGNOSIS — T82598S Other mechanical complication of other cardiac and vascular devices and implants, sequela: Secondary | ICD-10-CM | POA: Diagnosis not present

## 2014-03-06 DIAGNOSIS — R279 Unspecified lack of coordination: Secondary | ICD-10-CM | POA: Diagnosis not present

## 2014-03-06 DIAGNOSIS — M6281 Muscle weakness (generalized): Secondary | ICD-10-CM | POA: Diagnosis not present

## 2014-03-06 DIAGNOSIS — M545 Low back pain: Secondary | ICD-10-CM | POA: Diagnosis not present

## 2014-03-06 DIAGNOSIS — I629 Nontraumatic intracranial hemorrhage, unspecified: Secondary | ICD-10-CM | POA: Diagnosis not present

## 2014-03-06 DIAGNOSIS — R2681 Unsteadiness on feet: Secondary | ICD-10-CM | POA: Diagnosis not present

## 2014-03-07 DIAGNOSIS — R2681 Unsteadiness on feet: Secondary | ICD-10-CM | POA: Diagnosis not present

## 2014-03-07 DIAGNOSIS — I629 Nontraumatic intracranial hemorrhage, unspecified: Secondary | ICD-10-CM | POA: Diagnosis not present

## 2014-03-07 DIAGNOSIS — T82598S Other mechanical complication of other cardiac and vascular devices and implants, sequela: Secondary | ICD-10-CM | POA: Diagnosis not present

## 2014-03-07 DIAGNOSIS — M545 Low back pain: Secondary | ICD-10-CM | POA: Diagnosis not present

## 2014-03-07 DIAGNOSIS — M6281 Muscle weakness (generalized): Secondary | ICD-10-CM | POA: Diagnosis not present

## 2014-03-07 DIAGNOSIS — R279 Unspecified lack of coordination: Secondary | ICD-10-CM | POA: Diagnosis not present

## 2014-03-08 ENCOUNTER — Ambulatory Visit (INDEPENDENT_AMBULATORY_CARE_PROVIDER_SITE_OTHER): Payer: Medicare Other | Admitting: Cardiology

## 2014-03-08 ENCOUNTER — Encounter: Payer: Self-pay | Admitting: Internal Medicine

## 2014-03-08 ENCOUNTER — Encounter: Payer: Self-pay | Admitting: Cardiology

## 2014-03-08 ENCOUNTER — Ambulatory Visit (INDEPENDENT_AMBULATORY_CARE_PROVIDER_SITE_OTHER): Payer: Medicare Other | Admitting: *Deleted

## 2014-03-08 VITALS — BP 124/68 | HR 79 | Ht 64.0 in | Wt 208.0 lb

## 2014-03-08 DIAGNOSIS — Z950 Presence of cardiac pacemaker: Secondary | ICD-10-CM | POA: Diagnosis not present

## 2014-03-08 DIAGNOSIS — I2510 Atherosclerotic heart disease of native coronary artery without angina pectoris: Secondary | ICD-10-CM

## 2014-03-08 DIAGNOSIS — M6281 Muscle weakness (generalized): Secondary | ICD-10-CM | POA: Diagnosis not present

## 2014-03-08 DIAGNOSIS — I629 Nontraumatic intracranial hemorrhage, unspecified: Secondary | ICD-10-CM | POA: Diagnosis not present

## 2014-03-08 DIAGNOSIS — I498 Other specified cardiac arrhythmias: Secondary | ICD-10-CM | POA: Diagnosis not present

## 2014-03-08 DIAGNOSIS — E669 Obesity, unspecified: Secondary | ICD-10-CM

## 2014-03-08 DIAGNOSIS — I10 Essential (primary) hypertension: Secondary | ICD-10-CM

## 2014-03-08 DIAGNOSIS — R279 Unspecified lack of coordination: Secondary | ICD-10-CM | POA: Diagnosis not present

## 2014-03-08 DIAGNOSIS — M545 Low back pain: Secondary | ICD-10-CM | POA: Diagnosis not present

## 2014-03-08 DIAGNOSIS — T82598S Other mechanical complication of other cardiac and vascular devices and implants, sequela: Secondary | ICD-10-CM | POA: Diagnosis not present

## 2014-03-08 DIAGNOSIS — G4733 Obstructive sleep apnea (adult) (pediatric): Secondary | ICD-10-CM

## 2014-03-08 DIAGNOSIS — Z9989 Dependence on other enabling machines and devices: Principal | ICD-10-CM

## 2014-03-08 DIAGNOSIS — R2681 Unsteadiness on feet: Secondary | ICD-10-CM | POA: Diagnosis not present

## 2014-03-08 LAB — MDC_IDC_ENUM_SESS_TYPE_INCLINIC
Battery Remaining Longevity: 138 mo
Brady Statistic RA Percent Paced: 12 %
Date Time Interrogation Session: 20151016153035
Implantable Pulse Generator Model: 2240
Lead Channel Pacing Threshold Amplitude: 0.875 V
Lead Channel Sensing Intrinsic Amplitude: 4.5 mV
Lead Channel Setting Pacing Amplitude: 0.875
Lead Channel Setting Pacing Pulse Width: 0.5 ms
MDC IDC MSMT BATTERY VOLTAGE: 3.01 V
MDC IDC MSMT LEADCHNL RA IMPEDANCE VALUE: 525 Ohm
MDC IDC MSMT LEADCHNL RA PACING THRESHOLD PULSEWIDTH: 0.5 ms
MDC IDC MSMT LEADCHNL RV IMPEDANCE VALUE: 662.5 Ohm
MDC IDC MSMT LEADCHNL RV PACING THRESHOLD AMPLITUDE: 0.625 V
MDC IDC MSMT LEADCHNL RV PACING THRESHOLD PULSEWIDTH: 0.5 ms
MDC IDC MSMT LEADCHNL RV SENSING INTR AMPL: 12 mV
MDC IDC PG SERIAL: 7666288
MDC IDC SET LEADCHNL RA PACING AMPLITUDE: 1.875
MDC IDC SET LEADCHNL RV SENSING SENSITIVITY: 4 mV
MDC IDC STAT BRADY RV PERCENT PACED: 37 %

## 2014-03-08 NOTE — Progress Notes (Signed)
Wound check appointment. Steri-strips removed. Wound without redness or edema. Removed 1 suture. Incision edges approximated, wound well healed. Normal device function. Thresholds, sensing, and impedances consistent with implant measurements. Device programmed at 3.5V/auto capture programmed on for extra safety margin until 3 month visit. Histogram distribution appropriate for patient and level of activity. No mode switches or high ventricular rates noted. Turned on VIP w/ extension set to 124ms. Patient educated about wound care, arm mobility, lifting restrictions. ROV w/ Dr. Lovena Le in 65mo.

## 2014-03-08 NOTE — Patient Instructions (Signed)
Your physician recommends that you schedule a follow-up appointment in: 3 months with Dr. Lovena Le for a physician visit/device check.  Your physician wants you to follow-up in: 6 months with Dr. Perfecto Kingdom for CARDIOLOGY follow up. You will receive a reminder letter in the mail two months in advance. If you don't receive a letter, please call our office to schedule the follow-up appointment.  Your physician wants you to follow-up in: 1 year with Dr. Radford Pax for SLEEP follow up.  You will receive a reminder letter in the mail two months in advance. If you don't receive a letter, please call our office to schedule the follow-up appointment.

## 2014-03-08 NOTE — Progress Notes (Signed)
[place], [address], [place]  [address] Phone: [phone] Fax:  [phone]  Date:  03/08/2014   ID:  Keamber Macfadden, DOB 1928/07/03, MRN [id number]  PCP:  Henrine Screws, MD  Cardiologist:  Fransico Him, MD    History of Present Illness: CAITLYNN JU is a 80 y.o. female with a history of OSA, obesity and HTN. She is doing well with her CPAP device.  She feels rested in the am and has no daytime sleepiness.  She tolerates the mask and feels the pressure is adequate.  She recently had a PPM placed for syncope with  2:1 heart block.  She is currently is in Rehab in [place].   Wt Readings from Last 3 Encounters:  03/08/14 208 lb (94.348 kg)  03/03/14 216 lb (97.977 kg)  02/26/14 216 lb 4.8 oz (98.113 kg)     Past Medical History  Diagnosis Date  . Hypertension   . Myocardial infarction   . Breast cancer   . Rhinitis   . Osteopenia   . Rotator cuff syndrome   . Hyperlipemia   . GERD (gastroesophageal reflux disease)   . Hemorrhoids   . Colon polyps   . Macular degeneration   . Umbilical hernia   . Osteopenia   . Obstructive sleep apnea on CPAP   . Ulnar neuropathy   . Coronary artery disease 06/2006    STEMI inferior with BMS to mid RCA with mid basal inferior wall HK and luminal irrgularities elsewhere  . 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 Pacemaker 02/25/14  . SAH (subarachnoid hemorrhage) 02/22/14  . LBBB (left bundle branch block)   . Unstable gait   . Fracture of femoral neck, right 06/13/2011  . Fracture of humeral head, right, closed 06/13/2011    Current Outpatient Prescriptions  Medication Sig Dispense Refill  . amLODipine (NORVASC) 5 MG tablet One daily to control BP  30 tablet  3  . cholecalciferol (VITAMIN D) 1000 UNITS tablet Take 1,000 Units by mouth at bedtime.      . Coenzyme Q10 (COQ10) 200 MG CAPS Take 1 tablet by mouth daily.      Marland Kitchen ezetimibe (ZETIA) 10 MG tablet Take 10 mg by mouth 2 (two) times a week. Mondays and  fridays      . lisinopril (PRINIVIL,ZESTRIL) 20 MG tablet Take 20 mg by mouth daily.      . Multiple Vitamins-Minerals (PRESERVISION/LUTEIN) CAPS Take 1 capsule by mouth 2 (two) times daily.      . nitroGLYCERIN (NITROSTAT) 0.4 MG SL tablet Place 0.4 mg under the tongue every 5 (five) minutes as needed for chest pain.      . rosuvastatin (CRESTOR) 5 MG tablet Take 5 mg by mouth 2 (two) times a week. Tuesday and thursdays       No current facility-administered medications for this visit.    Allergies:    Allergies  Allergen Reactions  . Chlorpheniramine Anaphylaxis  . Codeine Anaphylaxis    Social History:  The patient  reports that she has never smoked. She does not have any smokeless tobacco history on file. She reports that she drinks alcohol.   Family History:  The patient's family history includes CVA in her sister; Heart attack in her father.   ROS:  Please see the history of present illness.      All other systems reviewed and negative.   PHYSICAL EXAM: VS:  BP 124/68  Pulse 79  Ht $R'5\' 4"'Sd$  (1.626 m)  Wt 208 lb (94.348 kg)  BMI 35.69 kg/m2 Well nourished, well developed, in no acute distress HEENT: normal Neck: no JVD Cardiac:  normal S1, S2; RRR; no murmur Lungs:  clear to auscultation bilaterally, no wheezing, rhonchi or rales Abd: soft, nontender, no hepatomegaly Ext: no edema Skin: warm and dry Neuro:  CNs 2-12 intact, no focal abnormalities noted  ASSESSMENT AND PLAN:  1.  Obstructive Sleep Apnea doing well with current settings. - her d/l today showed an AHI of 1.1/hr on 11cm H2O and 91% compliance in using more than 4 hours nightly 2. HTN - controlled - continue Lisinopril and Bystolic  3. Obesity - she will continue her water aerobics and Tai Chi  4.  Syncope with 2:1 HB s/p PPM - she will have a pacer check and wound check today in device clinic  Followup with me in 1 year and Dr. Irish Lack in 6 months      Signed, Fransico Him, MD [facility] 03/08/2014 11:39 AM

## 2014-03-09 DIAGNOSIS — M6281 Muscle weakness (generalized): Secondary | ICD-10-CM | POA: Diagnosis not present

## 2014-03-09 DIAGNOSIS — R279 Unspecified lack of coordination: Secondary | ICD-10-CM | POA: Diagnosis not present

## 2014-03-09 DIAGNOSIS — T82598S Other mechanical complication of other cardiac and vascular devices and implants, sequela: Secondary | ICD-10-CM | POA: Diagnosis not present

## 2014-03-09 DIAGNOSIS — I629 Nontraumatic intracranial hemorrhage, unspecified: Secondary | ICD-10-CM | POA: Diagnosis not present

## 2014-03-09 DIAGNOSIS — M545 Low back pain: Secondary | ICD-10-CM | POA: Diagnosis not present

## 2014-03-09 DIAGNOSIS — R2681 Unsteadiness on feet: Secondary | ICD-10-CM | POA: Diagnosis not present

## 2014-03-11 DIAGNOSIS — M545 Low back pain: Secondary | ICD-10-CM | POA: Diagnosis not present

## 2014-03-11 DIAGNOSIS — R2681 Unsteadiness on feet: Secondary | ICD-10-CM | POA: Diagnosis not present

## 2014-03-11 DIAGNOSIS — I629 Nontraumatic intracranial hemorrhage, unspecified: Secondary | ICD-10-CM | POA: Diagnosis not present

## 2014-03-11 DIAGNOSIS — T82598S Other mechanical complication of other cardiac and vascular devices and implants, sequela: Secondary | ICD-10-CM | POA: Diagnosis not present

## 2014-03-11 DIAGNOSIS — M6281 Muscle weakness (generalized): Secondary | ICD-10-CM | POA: Diagnosis not present

## 2014-03-11 DIAGNOSIS — R279 Unspecified lack of coordination: Secondary | ICD-10-CM | POA: Diagnosis not present

## 2014-03-12 DIAGNOSIS — R279 Unspecified lack of coordination: Secondary | ICD-10-CM | POA: Diagnosis not present

## 2014-03-12 DIAGNOSIS — I629 Nontraumatic intracranial hemorrhage, unspecified: Secondary | ICD-10-CM | POA: Diagnosis not present

## 2014-03-12 DIAGNOSIS — R2681 Unsteadiness on feet: Secondary | ICD-10-CM | POA: Diagnosis not present

## 2014-03-12 DIAGNOSIS — T82598S Other mechanical complication of other cardiac and vascular devices and implants, sequela: Secondary | ICD-10-CM | POA: Diagnosis not present

## 2014-03-12 DIAGNOSIS — M6281 Muscle weakness (generalized): Secondary | ICD-10-CM | POA: Diagnosis not present

## 2014-03-12 DIAGNOSIS — M545 Low back pain: Secondary | ICD-10-CM | POA: Diagnosis not present

## 2014-03-13 DIAGNOSIS — R2681 Unsteadiness on feet: Secondary | ICD-10-CM | POA: Diagnosis not present

## 2014-03-13 DIAGNOSIS — R279 Unspecified lack of coordination: Secondary | ICD-10-CM | POA: Diagnosis not present

## 2014-03-13 DIAGNOSIS — M545 Low back pain: Secondary | ICD-10-CM | POA: Diagnosis not present

## 2014-03-13 DIAGNOSIS — T82598S Other mechanical complication of other cardiac and vascular devices and implants, sequela: Secondary | ICD-10-CM | POA: Diagnosis not present

## 2014-03-13 DIAGNOSIS — I629 Nontraumatic intracranial hemorrhage, unspecified: Secondary | ICD-10-CM | POA: Diagnosis not present

## 2014-03-13 DIAGNOSIS — M6281 Muscle weakness (generalized): Secondary | ICD-10-CM | POA: Diagnosis not present

## 2014-03-15 DIAGNOSIS — R279 Unspecified lack of coordination: Secondary | ICD-10-CM | POA: Diagnosis not present

## 2014-03-15 DIAGNOSIS — T82598S Other mechanical complication of other cardiac and vascular devices and implants, sequela: Secondary | ICD-10-CM | POA: Diagnosis not present

## 2014-03-15 DIAGNOSIS — R2681 Unsteadiness on feet: Secondary | ICD-10-CM | POA: Diagnosis not present

## 2014-03-15 DIAGNOSIS — M6281 Muscle weakness (generalized): Secondary | ICD-10-CM | POA: Diagnosis not present

## 2014-03-15 DIAGNOSIS — M545 Low back pain: Secondary | ICD-10-CM | POA: Diagnosis not present

## 2014-03-15 DIAGNOSIS — I629 Nontraumatic intracranial hemorrhage, unspecified: Secondary | ICD-10-CM | POA: Diagnosis not present

## 2014-03-18 DIAGNOSIS — R279 Unspecified lack of coordination: Secondary | ICD-10-CM | POA: Diagnosis not present

## 2014-03-18 DIAGNOSIS — M545 Low back pain: Secondary | ICD-10-CM | POA: Diagnosis not present

## 2014-03-18 DIAGNOSIS — T82598S Other mechanical complication of other cardiac and vascular devices and implants, sequela: Secondary | ICD-10-CM | POA: Diagnosis not present

## 2014-03-18 DIAGNOSIS — I629 Nontraumatic intracranial hemorrhage, unspecified: Secondary | ICD-10-CM | POA: Diagnosis not present

## 2014-03-18 DIAGNOSIS — R2681 Unsteadiness on feet: Secondary | ICD-10-CM | POA: Diagnosis not present

## 2014-03-18 DIAGNOSIS — M6281 Muscle weakness (generalized): Secondary | ICD-10-CM | POA: Diagnosis not present

## 2014-03-19 DIAGNOSIS — I629 Nontraumatic intracranial hemorrhage, unspecified: Secondary | ICD-10-CM | POA: Diagnosis not present

## 2014-03-19 DIAGNOSIS — M6281 Muscle weakness (generalized): Secondary | ICD-10-CM | POA: Diagnosis not present

## 2014-03-19 DIAGNOSIS — R279 Unspecified lack of coordination: Secondary | ICD-10-CM | POA: Diagnosis not present

## 2014-03-19 DIAGNOSIS — M545 Low back pain: Secondary | ICD-10-CM | POA: Diagnosis not present

## 2014-03-19 DIAGNOSIS — T82598S Other mechanical complication of other cardiac and vascular devices and implants, sequela: Secondary | ICD-10-CM | POA: Diagnosis not present

## 2014-03-19 DIAGNOSIS — R2681 Unsteadiness on feet: Secondary | ICD-10-CM | POA: Diagnosis not present

## 2014-03-21 DIAGNOSIS — M6281 Muscle weakness (generalized): Secondary | ICD-10-CM | POA: Diagnosis not present

## 2014-03-21 DIAGNOSIS — I629 Nontraumatic intracranial hemorrhage, unspecified: Secondary | ICD-10-CM | POA: Diagnosis not present

## 2014-03-21 DIAGNOSIS — T82598S Other mechanical complication of other cardiac and vascular devices and implants, sequela: Secondary | ICD-10-CM | POA: Diagnosis not present

## 2014-03-21 DIAGNOSIS — R279 Unspecified lack of coordination: Secondary | ICD-10-CM | POA: Diagnosis not present

## 2014-03-21 DIAGNOSIS — R2681 Unsteadiness on feet: Secondary | ICD-10-CM | POA: Diagnosis not present

## 2014-03-21 DIAGNOSIS — M545 Low back pain: Secondary | ICD-10-CM | POA: Diagnosis not present

## 2014-03-22 ENCOUNTER — Telehealth: Payer: Self-pay | Admitting: Cardiology

## 2014-03-22 DIAGNOSIS — M545 Low back pain: Secondary | ICD-10-CM | POA: Diagnosis not present

## 2014-03-22 DIAGNOSIS — R2681 Unsteadiness on feet: Secondary | ICD-10-CM | POA: Diagnosis not present

## 2014-03-22 DIAGNOSIS — T82598S Other mechanical complication of other cardiac and vascular devices and implants, sequela: Secondary | ICD-10-CM | POA: Diagnosis not present

## 2014-03-22 DIAGNOSIS — R279 Unspecified lack of coordination: Secondary | ICD-10-CM | POA: Diagnosis not present

## 2014-03-22 DIAGNOSIS — I629 Nontraumatic intracranial hemorrhage, unspecified: Secondary | ICD-10-CM | POA: Diagnosis not present

## 2014-03-22 DIAGNOSIS — M6281 Muscle weakness (generalized): Secondary | ICD-10-CM | POA: Diagnosis not present

## 2014-03-22 NOTE — Telephone Encounter (Signed)
Dr. Irish Lack takes care of her Cardiac problems I follow her OSA> Need to forward to him

## 2014-03-22 NOTE — Telephone Encounter (Signed)
Instructed NOT to take Plavix until told otherwise. Patient had pacemaker placed 10/5 after a fall in which she hit her head and was found to have a subarachnoid hemorrhage.  Patient was seen by Dr. Radford Pax 10/16 and Plavix was not restarted. Patient concerned she has not taken Plavix in almost 1 month.  St she has not had any symptoms from the head bleed since her fall and brief loss of consciousness.  To Dr. Radford Pax for review and recommendations.

## 2014-03-22 NOTE — Telephone Encounter (Signed)
To Dr. Irish Lack for review and recommendations.

## 2014-03-22 NOTE — Telephone Encounter (Signed)
New message     Pt stopped plavix in order to have her pacemaker put in.  When should she restart her plavix?  Pt want you to call today.

## 2014-03-23 NOTE — Telephone Encounter (Signed)
OK to hold Plavix for now.  Stent is old enough that we can stop the plavix.  We do not want to make subarachnoid hemorrhage worse.

## 2014-03-25 NOTE — Telephone Encounter (Signed)
Patient informed to stop Plavix. Voices good understanding. Will see Dr. Lovena Le in 3 months for followup, then continue followup with Dr. Beau Fanny

## 2014-03-26 DIAGNOSIS — I629 Nontraumatic intracranial hemorrhage, unspecified: Secondary | ICD-10-CM | POA: Diagnosis not present

## 2014-03-26 DIAGNOSIS — T82598S Other mechanical complication of other cardiac and vascular devices and implants, sequela: Secondary | ICD-10-CM | POA: Diagnosis not present

## 2014-03-26 DIAGNOSIS — R279 Unspecified lack of coordination: Secondary | ICD-10-CM | POA: Diagnosis not present

## 2014-03-26 DIAGNOSIS — Z950 Presence of cardiac pacemaker: Secondary | ICD-10-CM | POA: Diagnosis not present

## 2014-03-26 DIAGNOSIS — M6281 Muscle weakness (generalized): Secondary | ICD-10-CM | POA: Diagnosis not present

## 2014-03-26 DIAGNOSIS — R296 Repeated falls: Secondary | ICD-10-CM | POA: Diagnosis not present

## 2014-03-26 DIAGNOSIS — R55 Syncope and collapse: Secondary | ICD-10-CM | POA: Diagnosis not present

## 2014-03-26 DIAGNOSIS — R2681 Unsteadiness on feet: Secondary | ICD-10-CM | POA: Diagnosis not present

## 2014-03-26 DIAGNOSIS — M545 Low back pain: Secondary | ICD-10-CM | POA: Diagnosis not present

## 2014-03-26 DIAGNOSIS — T82198S Other mechanical complication of other cardiac electronic device, sequela: Secondary | ICD-10-CM | POA: Diagnosis not present

## 2014-03-29 ENCOUNTER — Other Ambulatory Visit: Payer: Self-pay | Admitting: Cardiology

## 2014-04-03 ENCOUNTER — Ambulatory Visit: Payer: Self-pay | Admitting: Nurse Practitioner

## 2014-04-24 DIAGNOSIS — R2681 Unsteadiness on feet: Secondary | ICD-10-CM | POA: Diagnosis not present

## 2014-04-24 DIAGNOSIS — Z950 Presence of cardiac pacemaker: Secondary | ICD-10-CM | POA: Diagnosis not present

## 2014-04-24 DIAGNOSIS — M6281 Muscle weakness (generalized): Secondary | ICD-10-CM | POA: Diagnosis not present

## 2014-04-24 DIAGNOSIS — R279 Unspecified lack of coordination: Secondary | ICD-10-CM | POA: Diagnosis not present

## 2014-04-29 DIAGNOSIS — R2681 Unsteadiness on feet: Secondary | ICD-10-CM | POA: Diagnosis not present

## 2014-04-29 DIAGNOSIS — R279 Unspecified lack of coordination: Secondary | ICD-10-CM | POA: Diagnosis not present

## 2014-04-29 DIAGNOSIS — Z950 Presence of cardiac pacemaker: Secondary | ICD-10-CM | POA: Diagnosis not present

## 2014-04-29 DIAGNOSIS — M6281 Muscle weakness (generalized): Secondary | ICD-10-CM | POA: Diagnosis not present

## 2014-04-30 DIAGNOSIS — L0390 Cellulitis, unspecified: Secondary | ICD-10-CM | POA: Diagnosis not present

## 2014-04-30 DIAGNOSIS — I459 Conduction disorder, unspecified: Secondary | ICD-10-CM | POA: Diagnosis not present

## 2014-04-30 DIAGNOSIS — R600 Localized edema: Secondary | ICD-10-CM | POA: Diagnosis not present

## 2014-05-01 DIAGNOSIS — R2681 Unsteadiness on feet: Secondary | ICD-10-CM | POA: Diagnosis not present

## 2014-05-01 DIAGNOSIS — M6281 Muscle weakness (generalized): Secondary | ICD-10-CM | POA: Diagnosis not present

## 2014-05-01 DIAGNOSIS — R279 Unspecified lack of coordination: Secondary | ICD-10-CM | POA: Diagnosis not present

## 2014-05-01 DIAGNOSIS — Z950 Presence of cardiac pacemaker: Secondary | ICD-10-CM | POA: Diagnosis not present

## 2014-05-02 ENCOUNTER — Encounter (HOSPITAL_COMMUNITY): Payer: Self-pay | Admitting: Internal Medicine

## 2014-05-08 DIAGNOSIS — L0390 Cellulitis, unspecified: Secondary | ICD-10-CM | POA: Diagnosis not present

## 2014-05-08 DIAGNOSIS — R600 Localized edema: Secondary | ICD-10-CM | POA: Diagnosis not present

## 2014-05-08 DIAGNOSIS — Z23 Encounter for immunization: Secondary | ICD-10-CM | POA: Diagnosis not present

## 2014-05-13 DIAGNOSIS — Z950 Presence of cardiac pacemaker: Secondary | ICD-10-CM | POA: Diagnosis not present

## 2014-05-13 DIAGNOSIS — M6281 Muscle weakness (generalized): Secondary | ICD-10-CM | POA: Diagnosis not present

## 2014-05-13 DIAGNOSIS — R2681 Unsteadiness on feet: Secondary | ICD-10-CM | POA: Diagnosis not present

## 2014-05-13 DIAGNOSIS — R279 Unspecified lack of coordination: Secondary | ICD-10-CM | POA: Diagnosis not present

## 2014-05-27 ENCOUNTER — Telehealth: Payer: Self-pay | Admitting: Interventional Cardiology

## 2014-05-27 NOTE — Telephone Encounter (Signed)
New Message  Pt stated- Pt has dental appt coming up 1/12. Dr. Lovena Le placed pcmkr in Oct (has appt with [facility]). Pt wanted to speak with Rn about appt this week for clearance. Please call back and discuss.

## 2014-05-27 NOTE — Telephone Encounter (Signed)
I spoke with the patient. She states she had a PPM placed on 02/25/14. She had a bout of cellulitis after that. She is now pending a dental procedure on 06/04/14 with Dr. Gwenyth Bender. Per the patient, they will be cutting in to her gum to remove an old cap and replace it with a new one. She has concerns about this in relation to her heart. She is also concerns because multiple doctors have been changing her cardiac drugs. She does not want to proceed with her dental work until she discusses this with Dr. Irish Lack and clarifies her medications. An appointment has been made for her to follow up on 05/28/14 at 2:15 pm.

## 2014-05-28 ENCOUNTER — Encounter: Payer: Self-pay | Admitting: Interventional Cardiology

## 2014-05-28 ENCOUNTER — Ambulatory Visit (INDEPENDENT_AMBULATORY_CARE_PROVIDER_SITE_OTHER): Payer: Medicare Other | Admitting: Interventional Cardiology

## 2014-05-28 VITALS — BP 144/76 | HR 91 | Ht 64.0 in | Wt 216.0 lb

## 2014-05-28 DIAGNOSIS — E785 Hyperlipidemia, unspecified: Secondary | ICD-10-CM

## 2014-05-28 DIAGNOSIS — I351 Nonrheumatic aortic (valve) insufficiency: Secondary | ICD-10-CM | POA: Diagnosis not present

## 2014-05-28 DIAGNOSIS — I10 Essential (primary) hypertension: Secondary | ICD-10-CM | POA: Diagnosis not present

## 2014-05-28 DIAGNOSIS — I2510 Atherosclerotic heart disease of native coronary artery without angina pectoris: Secondary | ICD-10-CM

## 2014-05-28 MED ORDER — LISINOPRIL 20 MG PO TABS: ORAL_TABLET | ORAL | Status: DC

## 2014-05-28 NOTE — Progress Notes (Signed)
Patient ID: [name], female   DOB: 1928/12/27, 81 y.o.   MRN: [id number]    [name], [name], [name]  [address] Phone: [phone] Fax:  [phone]  Date:  05/28/2014   ID:  [name], DOB [birth year], MRN [id number]  PCP:  Henrine Screws, MD      History of Present Illness: [name] is a 81 y.o. female  who had an inferior MI in 2008. She has been doing well.  She had  A BMS a that time. CAD/ASCVD:  She is back to using CPAP. Now lives in [name]. Denies : Chest pain.  Dizziness.  Dyspnea on exertion resolved.  Fatigue.  Leg edema.  Nitroglycerin.  Orthopnea.  Palpitations.  Paroxysmal nocturnal dyspnea.  Syncope.    Joint pains limit activity and exercise .  She had a syncopal episode. She was found to be bradycardic and a pacemaker was placed. Unfortunately, she also had an intracranial bleed after falling and hitting her head. Plavix was stopped.       Wt Readings from Last 3 Encounters:  05/28/14 216 lb (97.977 kg)  03/08/14 208 lb (94.348 kg)  03/03/14 216 lb (97.977 kg)     Past Medical History  Diagnosis Date  . Hypertension   . Myocardial infarction   . Breast cancer   . Rhinitis   . Osteopenia   . Rotator cuff syndrome   . Hyperlipemia   . GERD (gastroesophageal reflux disease)   . Hemorrhoids   . Colon polyps   . Macular degeneration   . Umbilical hernia   . Osteopenia   . Obstructive sleep apnea on CPAP   . Ulnar neuropathy   . Coronary artery disease 06/2006    STEMI inferior with BMS to mid RCA with mid basal inferior wall HK and luminal irrgularities elsewhere  . 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 Pacemaker 02/25/14  . SAH (subarachnoid hemorrhage) 02/22/14  . LBBB (left bundle branch block)   . Unstable gait   . Fracture of femoral neck, right 06/13/2011  . Fracture of humeral head, right, closed 06/13/2011    Current Outpatient Prescriptions  Medication Sig Dispense Refill  .  cholecalciferol (VITAMIN D) 1000 UNITS tablet Take 1,000 Units by mouth at bedtime.    . Coenzyme Q10 (COQ10) 200 MG CAPS Take 1 tablet by mouth daily.    Marland Kitchen ezetimibe (ZETIA) 10 MG tablet Take 10 mg by mouth 2 (two) times a week. Mondays and fridays    . lisinopril (PRINIVIL,ZESTRIL) 20 MG tablet TAKE (1) TABLET DAILY. 30 tablet 1  . Multiple Vitamins-Minerals (PRESERVISION/LUTEIN) CAPS Take 1 capsule by mouth 2 (two) times daily.    . nitroGLYCERIN (NITROSTAT) 0.4 MG SL tablet Place 0.4 mg under the tongue every 5 (five) minutes as needed for chest pain.    . rosuvastatin (CRESTOR) 5 MG tablet Take 5 mg by mouth 2 (two) times a week. Tuesday and thursdays     No current facility-administered medications for this visit.    Allergies:    Allergies  Allergen Reactions  . Chlorpheniramine Anaphylaxis  . Codeine Anaphylaxis    Social History:  The patient  reports that she has never smoked. She does not have any smokeless tobacco history on file. She reports that she drinks alcohol.   Family History:  The patient's family history includes CVA in her sister; Heart attack in her father.   ROS:  Please see the history of present illness.  No nausea, vomiting.  No fevers, chills.  No focal weakness.  No dysuria.   All other systems reviewed and negative.   PHYSICAL EXAM: VS:  BP 144/76 mmHg  Pulse 91  Ht 5\' 4"  (1.626 m)  Wt 216 lb (97.977 kg)  BMI 37.06 kg/m2 Well nourished, well developed, in no acute distress HEENT: normal Neck: no JVD, no carotid bruits Cardiac:  normal S1, S2; RRR;  Lungs:  clear to auscultation bilaterally, no wheezing, rhonchi or rales Abd: soft, nontender, no hepatomegaly Ext: no edema Skin: warm and dry Neuro:   no focal abnormalities noted Psych: normal affect  EKG:  NSR, LBBB    ASSESSMENT AND PLAN:  Coronary atherosclerosis of native coronary artery  Aspirin Tablet Delayed Release, 81 MG, 1 tablet, 2 x/week- this was stopped at her last visit but now  she has had the [place].; stopped clopidogrel after her ICH. IMAGING: EKG    Harward,Amy 10/31/2012 02:04:00 PM > Watson Robarge,JAY 10/31/2012 02:23:46 PM > NSR, LBBB   Notes: No angina. Can Go back to cardiac rehab if she prefers.  currently not on any regular antiplatelet therapy. She will have her dental work. Based on her echocardiogram in 2011 which I reviewed showing mild AI, she does not require any SBE prophylaxis. She has been taking SBE prophylaxis per her dentist. She will likely continue this.    2. Hyperlipemia, mixed  Continue Crestor Tablet, 10 MG, TAKE 1/2 TABLET TWICE A WEEK-per her peference Continue Zetia Tablet, 10 MG, TAKE 1 TABLET twice a week-per her preference. Notes: LDL 83. LDL 78 in 8/14; she states that her lipids were checked by her primary care physician recently and were well controlled. She was taking the regimen as noted above. If her lipids are controlled, I would just continue what she is taking. She is comfortable with it. She is uncomfortable taking something daily that could affect her liver.    3. MI, Old  Notes: No CHF.    4.: Hypertension- controlled. Continue current medicines. Check BP at home.  If readings > 150/90, then could add back low dose bystolic.  Would avoid amlodipine given that she had swelling and cellulitis.  F/u with Dr. Radford Pax for OSA mgmt.  Preventive Medicine  Adult topics discussed:  Diet: healthy diet.  Exercise: 5 days a week, at least 30 minutes of aerobic exercise.         Signed, Mina Marble, MD, [facility] 05/28/2014 2:56 PM

## 2014-05-28 NOTE — Patient Instructions (Addendum)
Your physician wants you to follow-up in:  6 months. You will receive a reminder letter in the mail two months in advance. If you don't receive a letter, please call our office to schedule the follow-up appointment.  Check blood pressure 2-3 times per week and keep record. Call our office if systolic (top number) is greater than 150

## 2014-06-10 ENCOUNTER — Encounter: Payer: Self-pay | Admitting: Internal Medicine

## 2014-06-10 ENCOUNTER — Ambulatory Visit (INDEPENDENT_AMBULATORY_CARE_PROVIDER_SITE_OTHER): Payer: Medicare Other | Admitting: Internal Medicine

## 2014-06-10 VITALS — BP 140/68 | HR 70 | Ht 64.0 in | Wt 210.2 lb

## 2014-06-10 DIAGNOSIS — R55 Syncope and collapse: Secondary | ICD-10-CM

## 2014-06-10 DIAGNOSIS — Z950 Presence of cardiac pacemaker: Secondary | ICD-10-CM

## 2014-06-10 DIAGNOSIS — I459 Conduction disorder, unspecified: Secondary | ICD-10-CM | POA: Diagnosis not present

## 2014-06-10 DIAGNOSIS — I872 Venous insufficiency (chronic) (peripheral): Secondary | ICD-10-CM | POA: Diagnosis not present

## 2014-06-10 DIAGNOSIS — I10 Essential (primary) hypertension: Secondary | ICD-10-CM | POA: Diagnosis not present

## 2014-06-10 DIAGNOSIS — I2510 Atherosclerotic heart disease of native coronary artery without angina pectoris: Secondary | ICD-10-CM

## 2014-06-10 LAB — MDC_IDC_ENUM_SESS_TYPE_INCLINIC
Battery Remaining Longevity: 139.2 mo
Battery Voltage: 3.02 V
Brady Statistic RA Percent Paced: 11 %
Brady Statistic RV Percent Paced: 3 %
Date Time Interrogation Session: 20160118141412
Implantable Pulse Generator Serial Number: 7666288
Lead Channel Impedance Value: 475 Ohm
Lead Channel Pacing Threshold Amplitude: 0.625 V
Lead Channel Pacing Threshold Amplitude: 0.875 V
Lead Channel Pacing Threshold Pulse Width: 0.5 ms
Lead Channel Sensing Intrinsic Amplitude: 12 mV
Lead Channel Setting Pacing Amplitude: 0.875
Lead Channel Setting Pacing Amplitude: 1.875
Lead Channel Setting Pacing Pulse Width: 0.5 ms
Lead Channel Setting Sensing Sensitivity: 4 mV
MDC IDC MSMT LEADCHNL RA SENSING INTR AMPL: 4.7 mV
MDC IDC MSMT LEADCHNL RV IMPEDANCE VALUE: 625 Ohm
MDC IDC MSMT LEADCHNL RV PACING THRESHOLD PULSEWIDTH: 0.5 ms

## 2014-06-10 MED ORDER — ASPIRIN EC 81 MG PO TBEC: 81.0000 mg | DELAYED_RELEASE_TABLET | Freq: Every day | ORAL | Status: DC

## 2014-06-10 MED ORDER — NEBIVOLOL HCL 5 MG PO TABS: 5.0000 mg | ORAL_TABLET | Freq: Every day | ORAL | Status: DC

## 2014-06-10 NOTE — Progress Notes (Signed)
HPI Mrs. Sobh returns today for followup of her PPM. She is a pleasant 81 yo woman with symptomatic bradycardia and 2:1 AV block and LBBB who underwent PPM insertion approximately 4 months ago. In the interim, she has had no recurrent syncope. She denies chest pain or sob. She has been bothered by peripheral edema, left more than right and had some cellulitis. No other complaints today Allergies  Allergen Reactions  . Chlorpheniramine Anaphylaxis  . Codeine Anaphylaxis     Current Outpatient Prescriptions  Medication Sig Dispense Refill  . amLODipine (NORVASC) 5 MG tablet Take 5 mg by mouth daily.    . Cholecalciferol (VITAMIN D3) 2000 UNITS TABS Take 1 tablet by mouth daily.    . clopidogrel (PLAVIX) 75 MG tablet Take 75 mg by mouth daily.    . Coenzyme Q10 (COQ10) 200 MG CAPS Take 1 tablet by mouth daily.    Marland Kitchen ezetimibe (ZETIA) 10 MG tablet Take 10 mg by mouth 2 (two) times a week. Mondays and fridays    . lisinopril (PRINIVIL,ZESTRIL) 20 MG tablet TAKE (1) TABLET DAILY. (Patient taking differently: TAKE (1) TABLET BY MOUTH DAILY.) 30 tablet 11  . Multiple Vitamins-Minerals (PRESERVISION/LUTEIN) CAPS Take 1 capsule by mouth 2 (two) times daily.    . nitroGLYCERIN (NITROSTAT) 0.4 MG SL tablet Place 0.4 mg under the tongue every 5 (five) minutes as needed for chest pain.    . rosuvastatin (CRESTOR) 10 MG tablet Take 5 mg by mouth 2 (two) times a week. Tuesdays and Thursday     No current facility-administered medications for this visit.     Past Medical History  Diagnosis Date  . Hypertension   . Myocardial infarction   . Breast cancer   . Rhinitis   . Osteopenia   . Rotator cuff syndrome   . Hyperlipemia   . GERD (gastroesophageal reflux disease)   . Hemorrhoids   . Colon polyps   . Macular degeneration   . Umbilical hernia   . Osteopenia   . Obstructive sleep apnea on CPAP   . Ulnar neuropathy   . Coronary artery disease 06/2006    STEMI inferior with BMS to mid  RCA with mid basal inferior wall HK and luminal irrgularities elsewhere  . 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 Pacemaker 02/25/14  . SAH (subarachnoid hemorrhage) 02/22/14  . LBBB (left bundle branch block)   . Unstable gait   . Fracture of femoral neck, right 06/13/2011  . Fracture of humeral head, right, closed 06/13/2011    ROS:   All systems reviewed and negative except as noted in the HPI.   Past Surgical History  Procedure Laterality Date  . Shoulder surgery    . Breast lumpectomy    . Wrist surgery    . Hip arthroplasty  06/06/2011    Procedure: ARTHROPLASTY BIPOLAR HIP;  Surgeon: Gearlean Alf;  Location: [facility];  Service: Orthopedics;  Laterality: Right;  . Coronary angioplasty with stent placement  06/2006    Mid RCA BMS  . Permanent pacemaker insertion N/A 02/25/2014    Procedure: PERMANENT PACEMAKER INSERTION;  Surgeon: Evans Lance, MD;  [facility]: [facility] C[facility] LAB;  Service: Cardiovascular;  Laterality: N/A;     Family History  Problem Relation Age of Onset  . Heart attack Father   . CVA Sister      History   Social History  . Marital Status: Married    Spouse Name: N/A    Number of  Children: N/A  . Years of Education: N/A   Occupational History  . Not on file.   Social History Main Topics  . Smoking status: Never Smoker   . Smokeless tobacco: Not on file  . Alcohol Use: Yes     Comment: occasional wine  . Drug Use: Not on file  . Sexual Activity: Not on file   Other Topics Concern  . Not on file   Social History Narrative     BP 140/68 mmHg  Pulse 70  Ht 5\' 4"  (1.626 m)  Wt 210 lb 3.2 oz (95.346 kg)  BMI 36.06 kg/m2  Physical Exam:  Well appearing 81 yo woman, NAD HEENT: Unremarkable Neck:  No JVD, no thyromegally Back:  No CVA tenderness Lungs:  Clear with no wheezes; well healed PPM incision. HEART:  Regular rate rhythm, no murmurs, no rubs, no clicks Abd:  soft, positive bowel sounds, no organomegally, no rebound, no guarding Ext:  2  plus pulses, no edema, no cyanosis, no clubbing Skin:  No rashes no nodules Neuro:  CN II through XII intact, motor grossly intact   DEVICE  Normal device function.  See PaceArt for details.   Assess/Plan:

## 2014-06-10 NOTE — Assessment & Plan Note (Signed)
She has a very soft murmur of AI. No additional evaluation for now. She will need repeat echo in approximately 1-2 years or if symptomatic.

## 2014-06-10 NOTE — Assessment & Plan Note (Signed)
I have asked the patient to restart bystolic now that her PPM is in place.

## 2014-06-10 NOTE — Assessment & Plan Note (Signed)
She has more swelling on the right side (prior hip replacement) than on the left. She notes that she had some cellulitis several weeks ago. She will keep her leg elevated and I encouraged her to consider TED hose.

## 2014-06-10 NOTE — Assessment & Plan Note (Signed)
She has had no recurrent syncope since her PPM was placed. Will follow.

## 2014-06-10 NOTE — Assessment & Plan Note (Signed)
Interogation of her [employer] DDD PM demonstrates normal device function. Will recheck in several months.

## 2014-06-10 NOTE — Assessment & Plan Note (Signed)
She denies anginal symptoms. She has been holding her plavix. We discussed whether she should be on plavix, ASA or nothing as she is 7 years out from her stent. I will discuss with Dr. Irish Lack. My inclination would be for her to take ASA 81 mg daily. Ill discuss with her primary cardiologist.

## 2014-06-10 NOTE — Patient Instructions (Signed)
Your physician has recommended you make the following change in your medication:   1.)  Aspirin 81 mg once daily 2.)  Bystolic 5 mg once daily  Remote monitoring is used to monitor your Pacemaker of ICD from home. This monitoring reduces the number of office visits required to check your device to one time per year. It allows [place] to keep an eye on the functioning of your device to ensure it is working properly. You are scheduled for a device check from home on 09/09/2014. You may send your transmission at any time that day. If you have a wireless device, the transmission will be sent automatically. After your physician reviews your transmission, you will receive a postcard with your next transmission date.  Your physician wants you to follow-up in: October 2016 with Dr. Lovena Le.  You will receive a reminder letter in the mail two months in advance. If you don't receive a letter, please call our office to schedule the follow-up appointment.

## 2014-06-26 DIAGNOSIS — H3531 Nonexudative age-related macular degeneration: Secondary | ICD-10-CM | POA: Diagnosis not present

## 2014-06-26 DIAGNOSIS — H52203 Unspecified astigmatism, bilateral: Secondary | ICD-10-CM | POA: Diagnosis not present

## 2014-06-26 DIAGNOSIS — H43813 Vitreous degeneration, bilateral: Secondary | ICD-10-CM | POA: Diagnosis not present

## 2014-06-26 DIAGNOSIS — H04123 Dry eye syndrome of bilateral lacrimal glands: Secondary | ICD-10-CM | POA: Diagnosis not present

## 2014-07-10 DIAGNOSIS — I8312 Varicose veins of left lower extremity with inflammation: Secondary | ICD-10-CM | POA: Diagnosis not present

## 2014-07-10 DIAGNOSIS — I8311 Varicose veins of right lower extremity with inflammation: Secondary | ICD-10-CM | POA: Diagnosis not present

## 2014-07-24 DIAGNOSIS — K219 Gastro-esophageal reflux disease without esophagitis: Secondary | ICD-10-CM | POA: Diagnosis not present

## 2014-07-24 DIAGNOSIS — G4733 Obstructive sleep apnea (adult) (pediatric): Secondary | ICD-10-CM | POA: Diagnosis not present

## 2014-07-24 DIAGNOSIS — E669 Obesity, unspecified: Secondary | ICD-10-CM | POA: Diagnosis not present

## 2014-07-24 DIAGNOSIS — E782 Mixed hyperlipidemia: Secondary | ICD-10-CM | POA: Diagnosis not present

## 2014-07-24 DIAGNOSIS — Z6834 Body mass index (BMI) 34.0-34.9, adult: Secondary | ICD-10-CM | POA: Diagnosis not present

## 2014-07-24 DIAGNOSIS — R600 Localized edema: Secondary | ICD-10-CM | POA: Diagnosis not present

## 2014-07-24 DIAGNOSIS — I2510 Atherosclerotic heart disease of native coronary artery without angina pectoris: Secondary | ICD-10-CM | POA: Diagnosis not present

## 2014-07-24 DIAGNOSIS — I10 Essential (primary) hypertension: Secondary | ICD-10-CM | POA: Diagnosis not present

## 2014-07-30 ENCOUNTER — Ambulatory Visit: Payer: Medicare Other | Admitting: Interventional Cardiology

## 2014-08-07 DIAGNOSIS — D1801 Hemangioma of skin and subcutaneous tissue: Secondary | ICD-10-CM | POA: Diagnosis not present

## 2014-08-07 DIAGNOSIS — I872 Venous insufficiency (chronic) (peripheral): Secondary | ICD-10-CM | POA: Diagnosis not present

## 2014-08-07 DIAGNOSIS — I8312 Varicose veins of left lower extremity with inflammation: Secondary | ICD-10-CM | POA: Diagnosis not present

## 2014-08-07 DIAGNOSIS — I8311 Varicose veins of right lower extremity with inflammation: Secondary | ICD-10-CM | POA: Diagnosis not present

## 2014-09-06 ENCOUNTER — Telehealth: Payer: Self-pay | Admitting: Internal Medicine

## 2014-09-06 NOTE — Telephone Encounter (Signed)
error 

## 2014-09-09 ENCOUNTER — Ambulatory Visit (INDEPENDENT_AMBULATORY_CARE_PROVIDER_SITE_OTHER): Payer: Medicare Other | Admitting: *Deleted

## 2014-09-09 DIAGNOSIS — R55 Syncope and collapse: Secondary | ICD-10-CM

## 2014-09-09 NOTE — Progress Notes (Signed)
Remote pacemaker transmission.   

## 2014-09-11 LAB — MDC_IDC_ENUM_SESS_TYPE_REMOTE
Battery Remaining Longevity: 126 mo
Brady Statistic AP VP Percent: 4.7 %
Brady Statistic AP VS Percent: 72 %
Brady Statistic AS VS Percent: 22 %
Brady Statistic RA Percent Paced: 76 %
Date Time Interrogation Session: 20160418140025
Lead Channel Pacing Threshold Amplitude: 0.875 V
Lead Channel Pacing Threshold Pulse Width: 0.5 ms
Lead Channel Sensing Intrinsic Amplitude: 12 mV
Lead Channel Sensing Intrinsic Amplitude: 5 mV
Lead Channel Setting Pacing Amplitude: 0.875
Lead Channel Setting Pacing Pulse Width: 0.5 ms
Lead Channel Setting Sensing Sensitivity: 4 mV
MDC IDC MSMT BATTERY REMAINING PERCENTAGE: 95.5 %
MDC IDC MSMT BATTERY VOLTAGE: 3.01 V
MDC IDC MSMT LEADCHNL RA IMPEDANCE VALUE: 510 Ohm
MDC IDC MSMT LEADCHNL RA PACING THRESHOLD PULSEWIDTH: 0.5 ms
MDC IDC MSMT LEADCHNL RV IMPEDANCE VALUE: 680 Ohm
MDC IDC MSMT LEADCHNL RV PACING THRESHOLD AMPLITUDE: 0.625 V
MDC IDC PG SERIAL: 7666288
MDC IDC SET LEADCHNL RA PACING AMPLITUDE: 1.875
MDC IDC STAT BRADY AS VP PERCENT: 1 %
MDC IDC STAT BRADY RV PERCENT PACED: 5.2 %

## 2014-09-16 ENCOUNTER — Encounter: Payer: Self-pay | Admitting: Cardiology

## 2014-09-19 ENCOUNTER — Encounter: Payer: Self-pay | Admitting: Internal Medicine

## 2014-10-17 DIAGNOSIS — R21 Rash and other nonspecific skin eruption: Secondary | ICD-10-CM | POA: Diagnosis not present

## 2014-10-17 DIAGNOSIS — L821 Other seborrheic keratosis: Secondary | ICD-10-CM | POA: Diagnosis not present

## 2014-10-17 DIAGNOSIS — L820 Inflamed seborrheic keratosis: Secondary | ICD-10-CM | POA: Diagnosis not present

## 2014-12-11 ENCOUNTER — Ambulatory Visit (INDEPENDENT_AMBULATORY_CARE_PROVIDER_SITE_OTHER): Payer: Medicare Other | Admitting: *Deleted

## 2014-12-11 ENCOUNTER — Telehealth: Payer: Self-pay | Admitting: Cardiology

## 2014-12-11 ENCOUNTER — Encounter: Payer: Self-pay | Admitting: Internal Medicine

## 2014-12-11 ENCOUNTER — Telehealth: Payer: Self-pay | Admitting: Internal Medicine

## 2014-12-11 DIAGNOSIS — R55 Syncope and collapse: Secondary | ICD-10-CM | POA: Diagnosis not present

## 2014-12-11 NOTE — Telephone Encounter (Signed)
LMOVM reminding pt to send remote transmission.   

## 2014-12-11 NOTE — Telephone Encounter (Signed)
New Message  Pt calling to speak w/ Device about remote transmission due to be sent today 7/20; Please call back and dsicuss.

## 2014-12-11 NOTE — Telephone Encounter (Signed)
Spoke w/ pt and informed her that she needs to send a transmission w/ home monitor. I instructed her how to do this. Pt verbalized understanding.

## 2014-12-11 NOTE — Progress Notes (Signed)
Remote pacemaker transmission.   

## 2014-12-12 ENCOUNTER — Telehealth: Payer: Self-pay | Admitting: Internal Medicine

## 2014-12-12 NOTE — Telephone Encounter (Signed)
°  1. Has your device fired? No ° °2. Is you device beeping? No ° °3. Are you experiencing draining or swelling at device site? No ° °4. Are you calling to see if we received your device transmission? Yes ° °5. Have you passed out? No ° °

## 2014-12-12 NOTE — Telephone Encounter (Signed)
Informed pt that her remote transmission was received.

## 2014-12-20 LAB — CUP PACEART REMOTE DEVICE CHECK
Battery Remaining Percentage: 95.5 %
Brady Statistic AP VP Percent: 9.4 %
Brady Statistic AP VS Percent: 68 %
Brady Statistic AS VP Percent: 1.1 %
Brady Statistic AS VS Percent: 20 %
Brady Statistic RA Percent Paced: 76 %
Lead Channel Impedance Value: 680 Ohm
Lead Channel Pacing Threshold Amplitude: 0.625 V
Lead Channel Pacing Threshold Pulse Width: 0.5 ms
Lead Channel Pacing Threshold Pulse Width: 0.5 ms
Lead Channel Sensing Intrinsic Amplitude: 12 mV
Lead Channel Setting Pacing Amplitude: 1.875
Lead Channel Setting Pacing Pulse Width: 0.5 ms
Lead Channel Setting Sensing Sensitivity: 4 mV
MDC IDC MSMT BATTERY REMAINING LONGEVITY: 121 mo
MDC IDC MSMT BATTERY VOLTAGE: 3.01 V
MDC IDC MSMT LEADCHNL RA IMPEDANCE VALUE: 510 Ohm
MDC IDC MSMT LEADCHNL RA PACING THRESHOLD AMPLITUDE: 0.875 V
MDC IDC MSMT LEADCHNL RA SENSING INTR AMPL: 4.8 mV
MDC IDC PG SERIAL: 7666288
MDC IDC SESS DTM: 20160720201015
MDC IDC SET LEADCHNL RV PACING AMPLITUDE: 0.875
MDC IDC STAT BRADY RV PERCENT PACED: 11 %
Pulse Gen Model: 2240

## 2014-12-26 ENCOUNTER — Encounter: Payer: Self-pay | Admitting: Cardiology

## 2014-12-27 ENCOUNTER — Encounter: Payer: Self-pay | Admitting: Cardiology

## 2015-01-15 DIAGNOSIS — H3531 Nonexudative age-related macular degeneration: Secondary | ICD-10-CM | POA: Diagnosis not present

## 2015-01-15 DIAGNOSIS — H43813 Vitreous degeneration, bilateral: Secondary | ICD-10-CM | POA: Diagnosis not present

## 2015-01-15 DIAGNOSIS — Z961 Presence of intraocular lens: Secondary | ICD-10-CM | POA: Diagnosis not present

## 2015-01-15 DIAGNOSIS — H26492 Other secondary cataract, left eye: Secondary | ICD-10-CM | POA: Diagnosis not present

## 2015-01-21 DIAGNOSIS — Z1231 Encounter for screening mammogram for malignant neoplasm of breast: Secondary | ICD-10-CM | POA: Diagnosis not present

## 2015-01-21 DIAGNOSIS — M810 Age-related osteoporosis without current pathological fracture: Secondary | ICD-10-CM | POA: Diagnosis not present

## 2015-01-21 DIAGNOSIS — Z124 Encounter for screening for malignant neoplasm of cervix: Secondary | ICD-10-CM | POA: Diagnosis not present

## 2015-01-21 DIAGNOSIS — Z01419 Encounter for gynecological examination (general) (routine) without abnormal findings: Secondary | ICD-10-CM | POA: Diagnosis not present

## 2015-01-29 ENCOUNTER — Telehealth: Payer: Self-pay | Admitting: Cardiology

## 2015-01-29 NOTE — Telephone Encounter (Signed)
error 

## 2015-02-07 ENCOUNTER — Other Ambulatory Visit: Payer: Self-pay | Admitting: Obstetrics

## 2015-02-07 DIAGNOSIS — R928 Other abnormal and inconclusive findings on diagnostic imaging of breast: Secondary | ICD-10-CM

## 2015-02-07 IMAGING — CT CT HEAD W/O CM
3 of 6 series · 13 of 47 positions shown, 15 images · non-contrast
Comparison: None.

CLINICAL DATA: Fall with loss of consciousness.  Evaluate for CVA.

EXAM:
CT HEAD WITHOUT CONTRAST
CT NECK WITHOUT CONTRAST
TECHNIQUE: Contiguous axial images were obtained from the base of the skull
through the vertex without contrast. Multidetector CT imaging of the
neck was performed using the standard protocol without intravenous
contrast.

[Series 3: head 2.0 h70h · axial · 0.43mm/px · z∈[+584,+606]mm · 2 of 77 slices shown]
[im 11/77  brain]
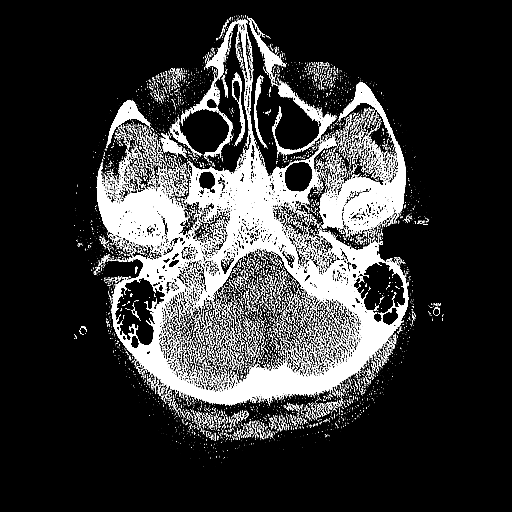
[im 22/77  brain]
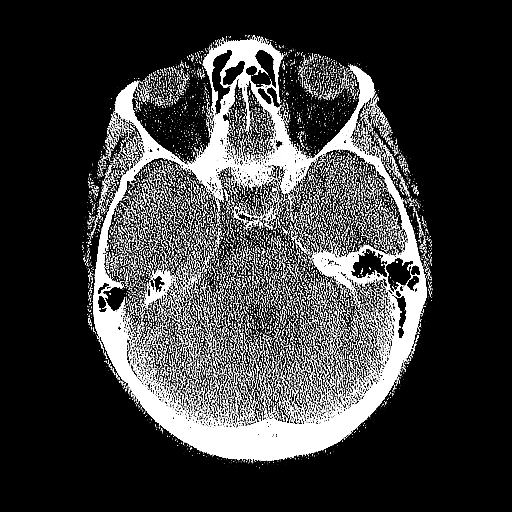

[Series 7: coronals · coronal · 0.29mm/px · 3 of 56 slices shown]
[im 19/56  brain]
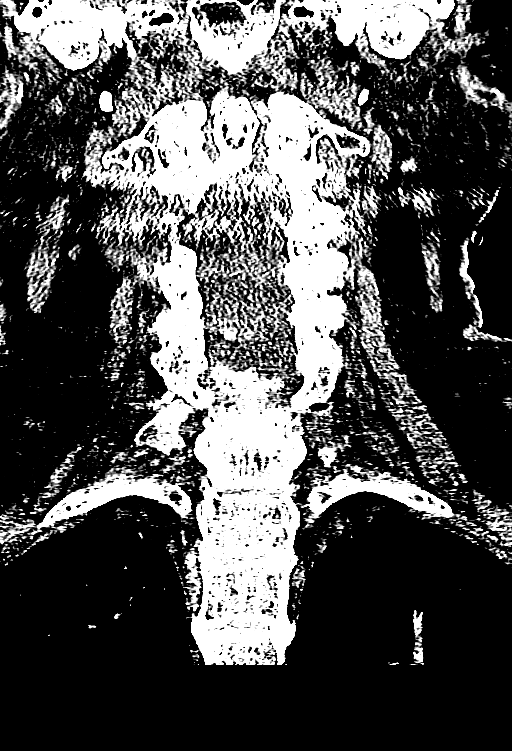
[im 25/56  brain]
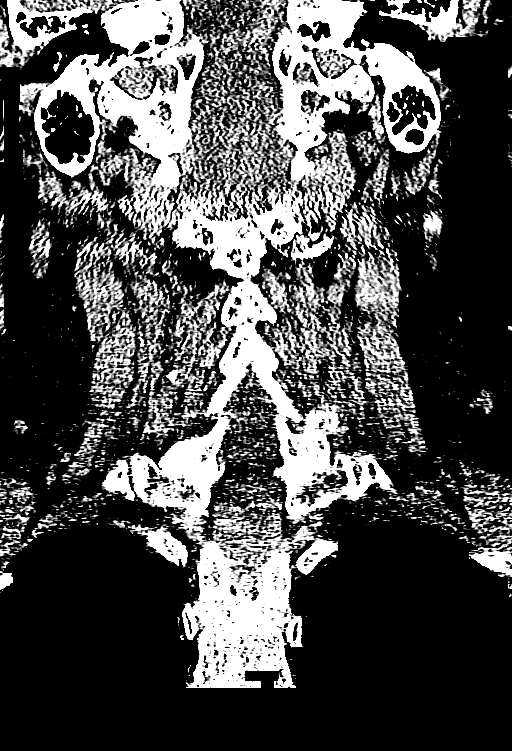
[im 31/56  brain]
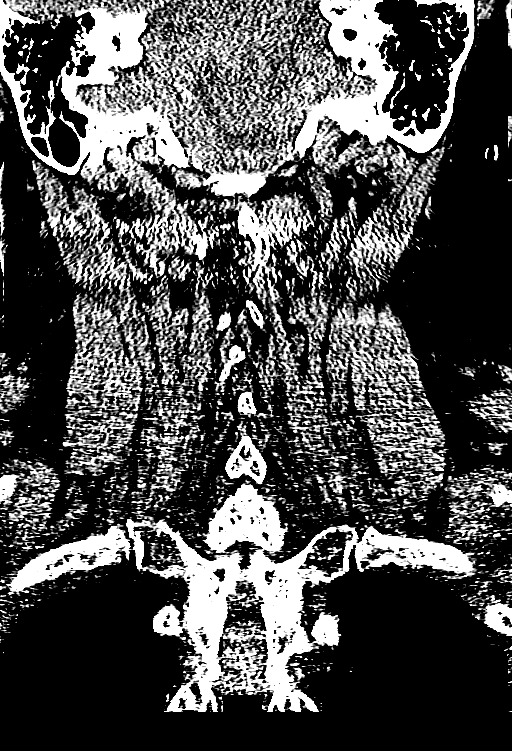

[Series 9: orthogonals · axial · 0.22mm/px · z∈[+399,+539]mm · 8 of 105 slices shown, 10 images]
[im 12/105  brain]
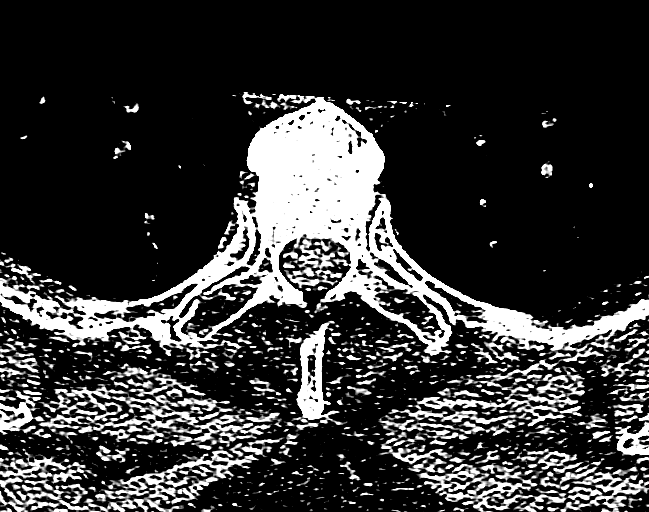
[im 12/105  bone]
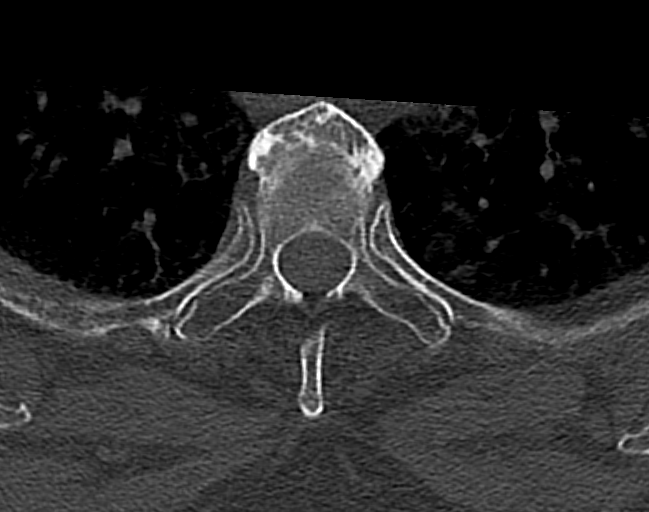
[im 24/105  brain]
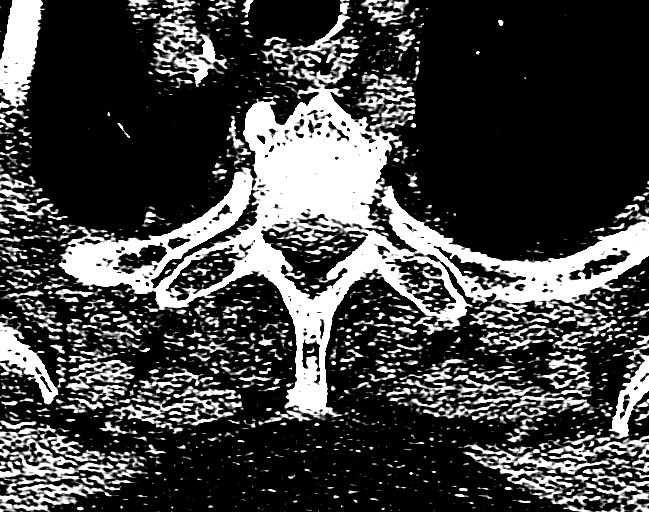
[im 35/105  brain]
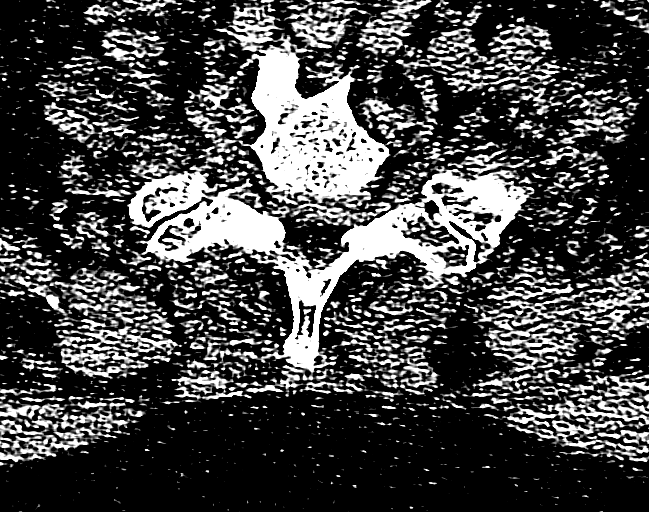
[im 47/105  brain]
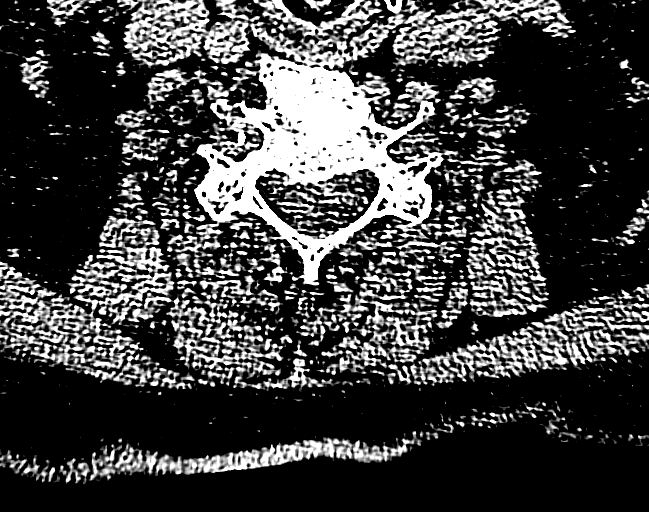
[im 58/105  brain]
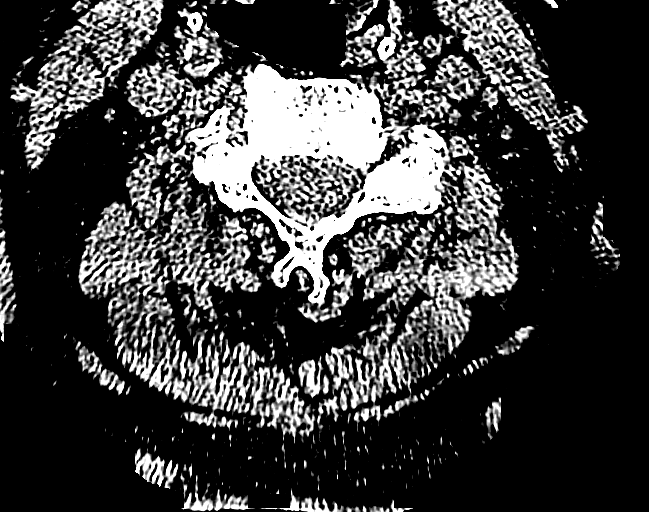
[im 58/105  bone]
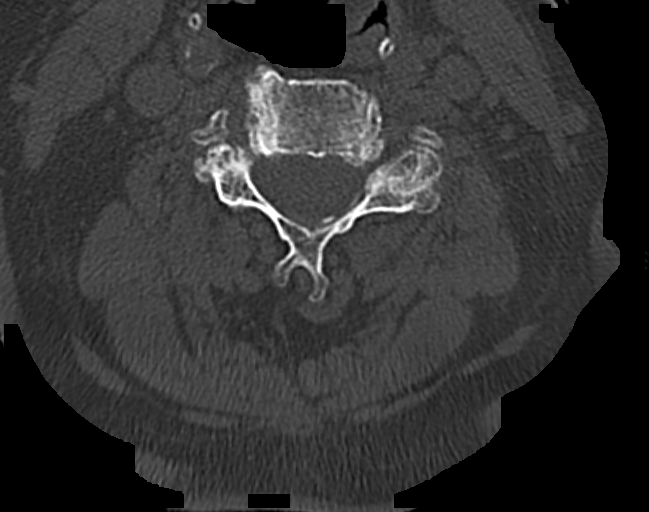
[im 70/105  brain]
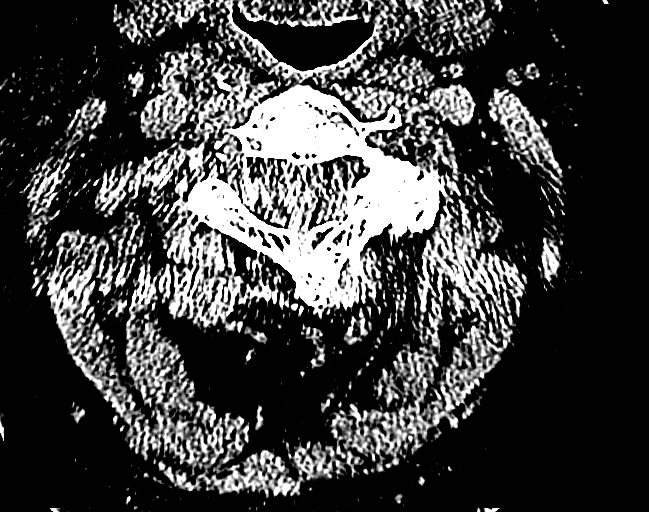
[im 81/105  brain]
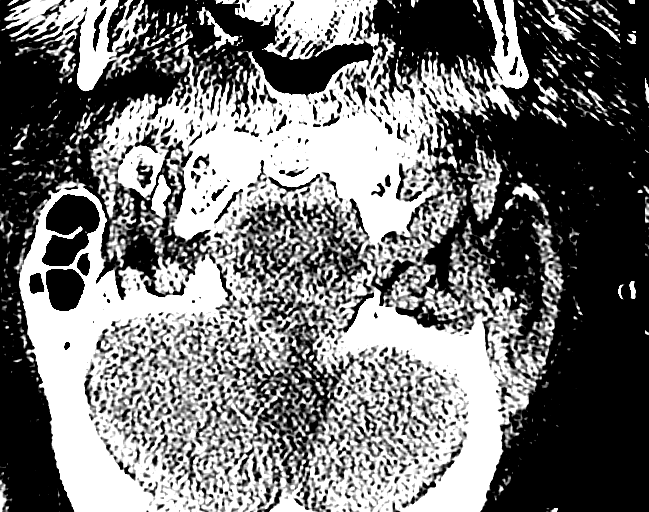
[im 93/105  brain]
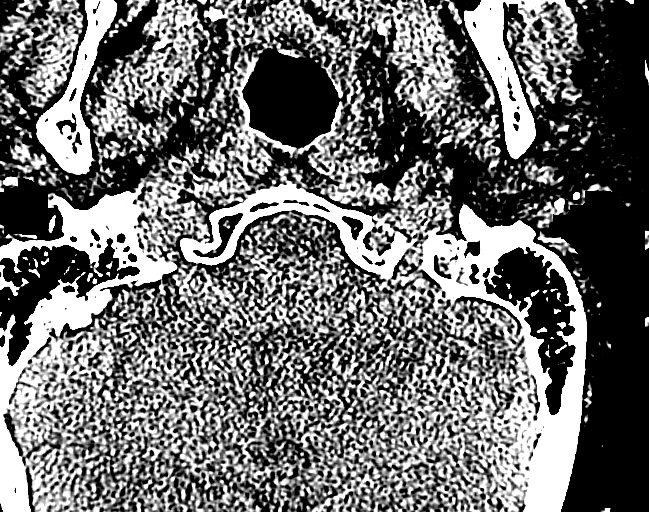

[13 of 47 positions shown; findings below may reference images not displayed]

FINDINGS: CT HEAD FINDINGS

Ventricles and sulci are appropriate for patient's age. There is a
small amount of acute subarachnoid hemorrhage within the right
frontal lobe (image 20; series 2). No mass effect. No mass lesion.
Bilateral cataract surgery. Mild mucosal thickening of the ethmoid
air cells and maxillary sinuses. Calvarium is intact.

Extensive bilateral subcortical and deep white matter hypodensities
compatible with chronic small vessel ischemic change. Bilateral
basal ganglia lacunar infarcts.

CT NECK FINDINGS

Visualized thyroid is unremarkable. Lung apices are clear.
Multilevel degenerative disc and facet disease. Multilevel anterior
endplate osteophytosis. Normal anatomic alignment of the cervical
spine. Prevertebral soft tissues unremarkable. Cranial cervical
junction is intact.
IMPRESSION: 1. Small amount of subarachnoid hemorrhage within the right frontal
lobe.
2. Multilevel degenerative change of the cervical spine without
evidence for acute fracture.
Critical Value/emergent results were called by telephone at the time
of interpretation on 02/22/2014 at [DATE] to Dr. Anush Baruch, who
verbally acknowledged these results.

## 2015-02-07 IMAGING — CT CT HEAD W/O CM
2 series · 15 of 30 positions shown, 19 images · non-contrast
Comparison: 02/22/2014 at 2890 hr.

CLINICAL DATA: Subsequent encounter, subarachnoid hemorrhage, on
anticoagulation. New bradycardia.

EXAM:
CT HEAD WITHOUT CONTRAST
TECHNIQUE: Contiguous axial images were obtained from the base of the skull
through the vertex without intravenous contrast.

[Series 201: head w/o, idose (1) · axial · non-contrast · 0.45mm/px · z∈[+72,+192]mm · 13 of 30 slices shown, 17 images]
[im 3/30  brain]
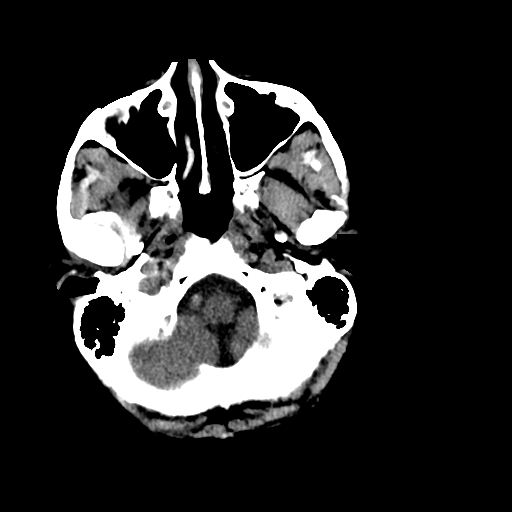
[im 3/30  bone]
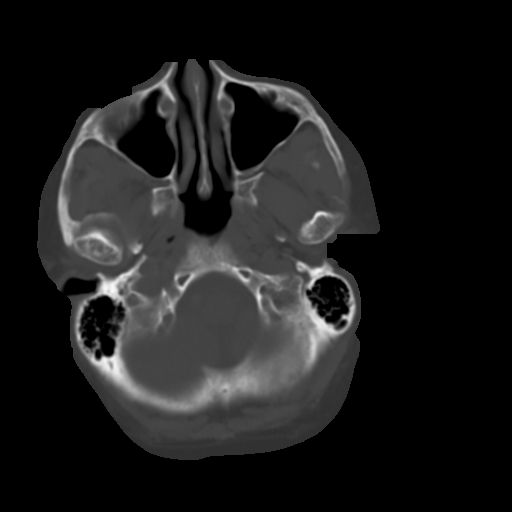
[im 5/30  brain]
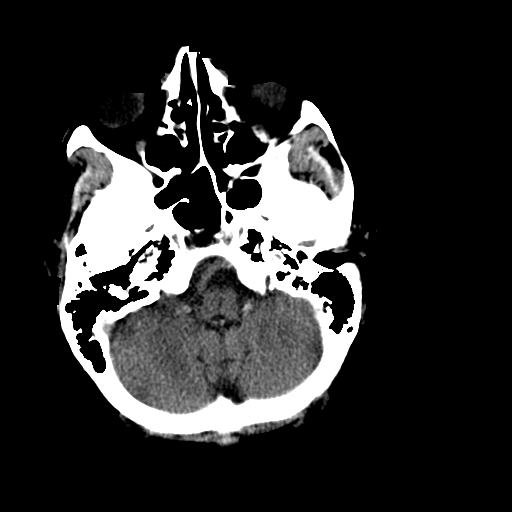
[im 7/30  brain]
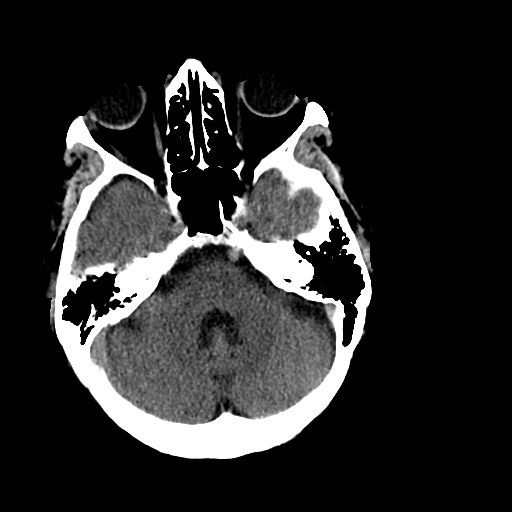
[im 9/30  brain]
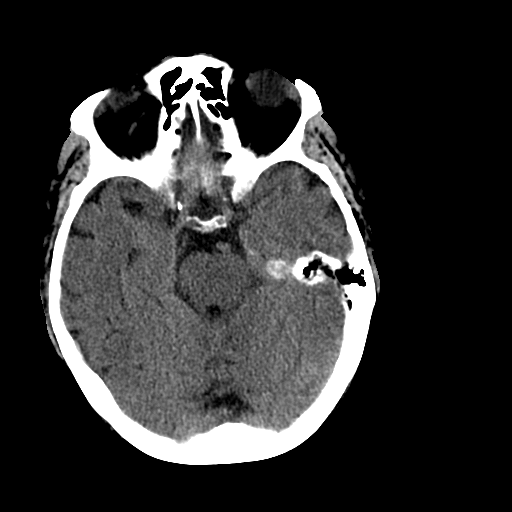
[im 11/30  brain]
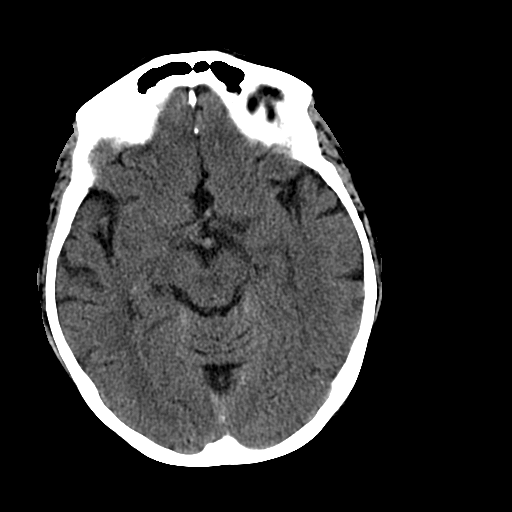
[im 11/30  bone]
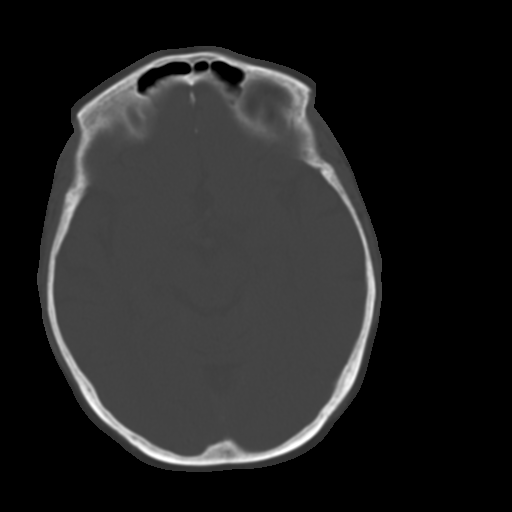
[im 13/30  brain]
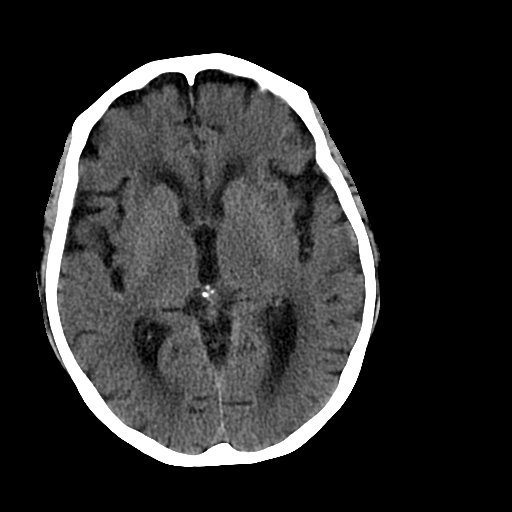
[im 15/30  brain]
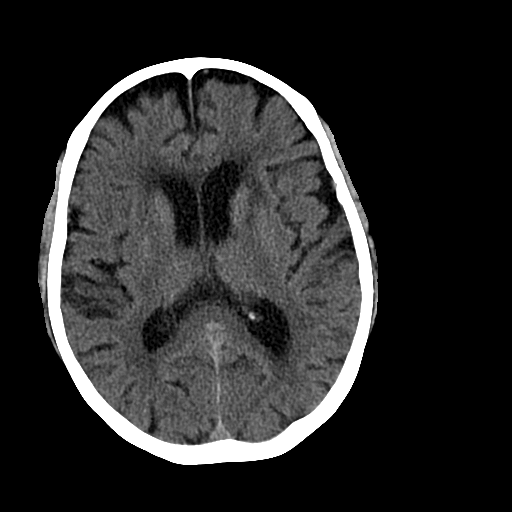
[im 17/30  brain]
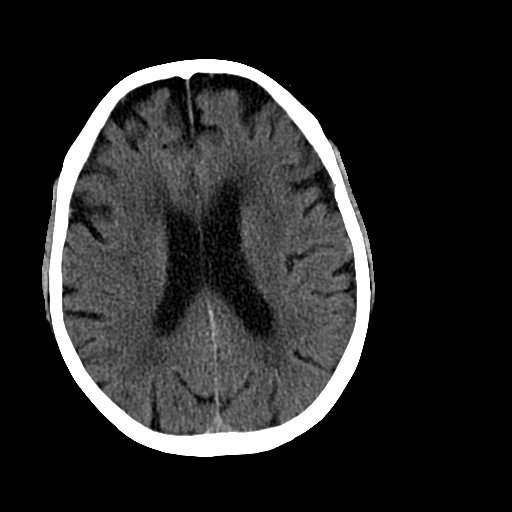
[im 19/30  brain]
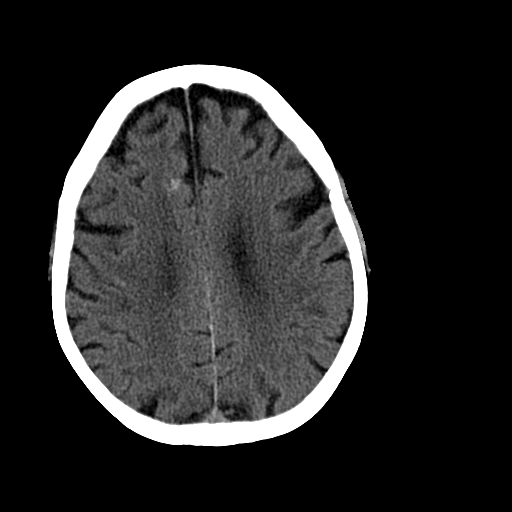
[im 19/30  bone]
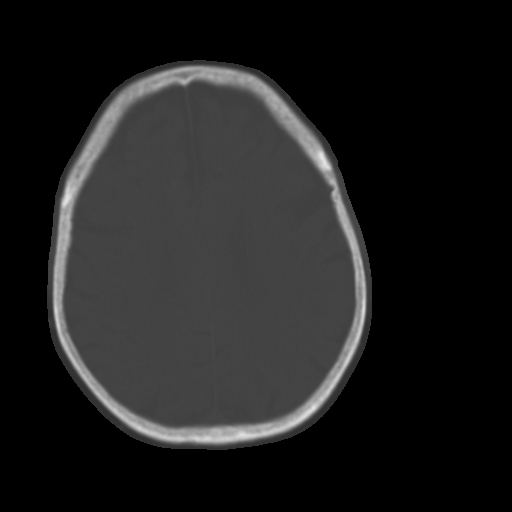
[im 21/30  brain]
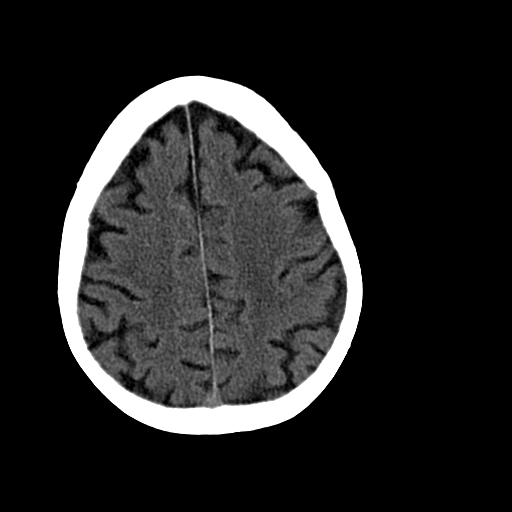
[im 23/30  brain]
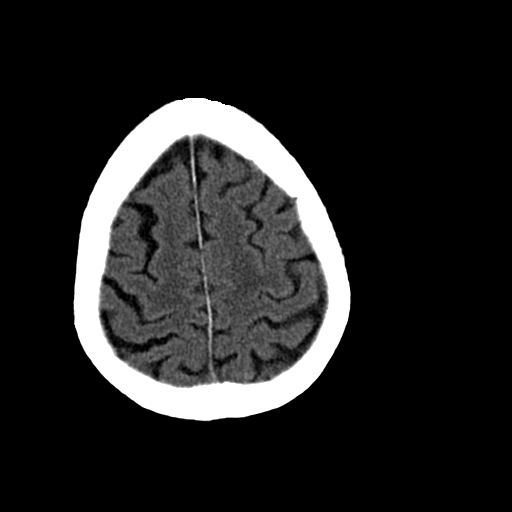
[im 25/30  brain]
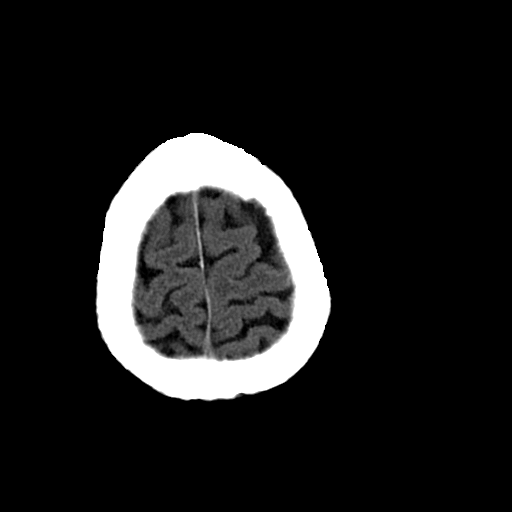
[im 27/30  brain]
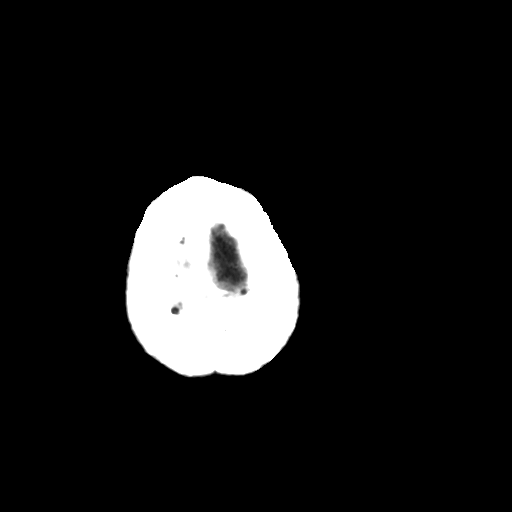
[im 27/30  bone]
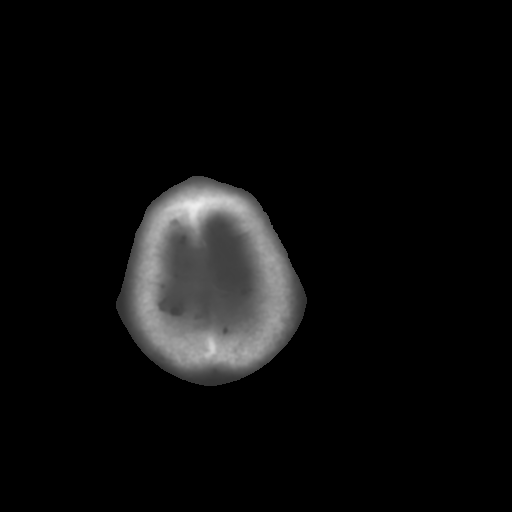

[Series 202: head w/o bone, idose (1) · axial · non-contrast · 0.45mm/px · z∈[+72,+92]mm · 2 of 30 slices shown]
[im 3/30  bone]
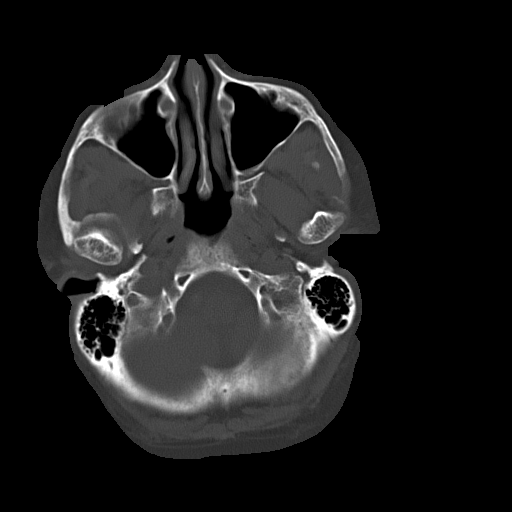
[im 7/30  bone]
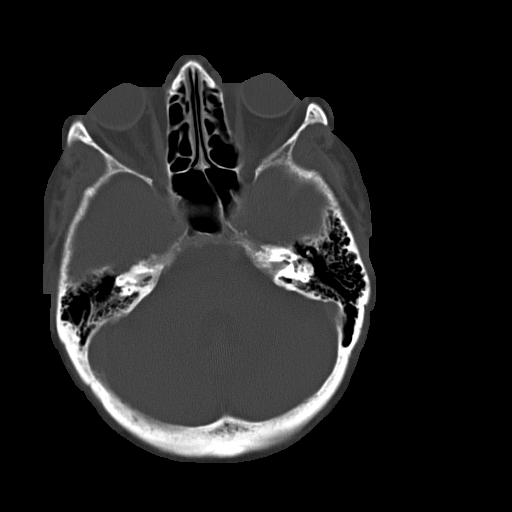

[15 of 30 positions shown; findings below may reference images not displayed]

FINDINGS: Small amount of hyper attenuation is seen in the sulci along the
medial right frontal lobe, unchanged. Question tiny amount of high
attenuation along the sulci of the anterior right parietal lobe
(image 19). No evidence of an acute infarct, mass lesion, mass
effect or hydrocephalus. Atrophy. Periventricular low attenuation.
No air-fluid levels in the visualized portions of the paranasal
sinuses and mastoid air cells.
IMPRESSION: 1. Acute subarachnoid hemorrhage along the medial right frontal lobe
is stable from exam performed at 2890 hr the same day. Question tiny
new focus of subarachnoid hemorrhage along the right parietal lobe.
2. Atrophy and chronic microvascular white matter ischemic changes.

## 2015-02-11 IMAGING — CR DG CHEST 2V
2 series · 2 of 2 positions shown · non-contrast
Comparison: 02/22/2014

CLINICAL DATA: Pacemaker placement.

EXAM:
CHEST  2 VIEW

[w chest lat]
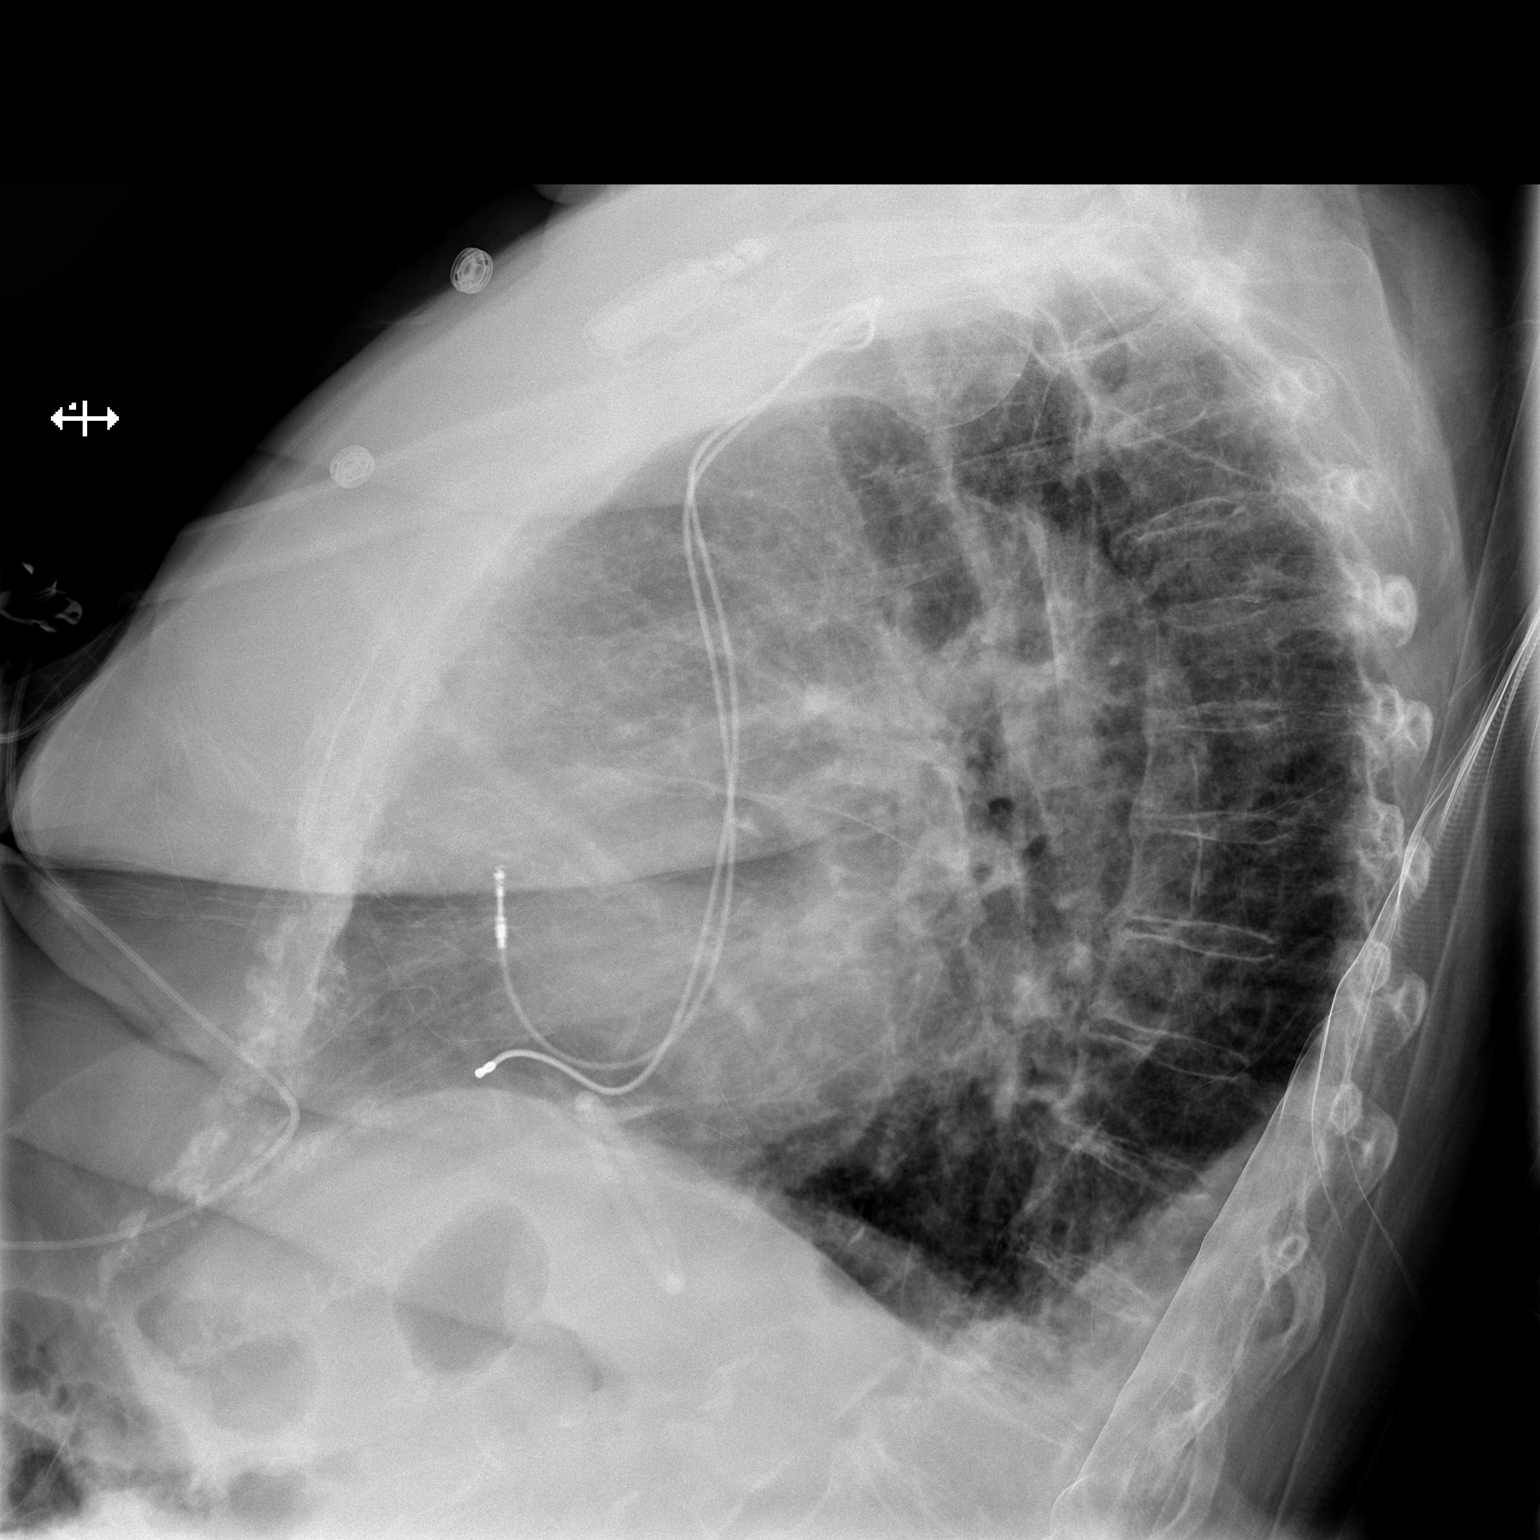

[x chest ap]
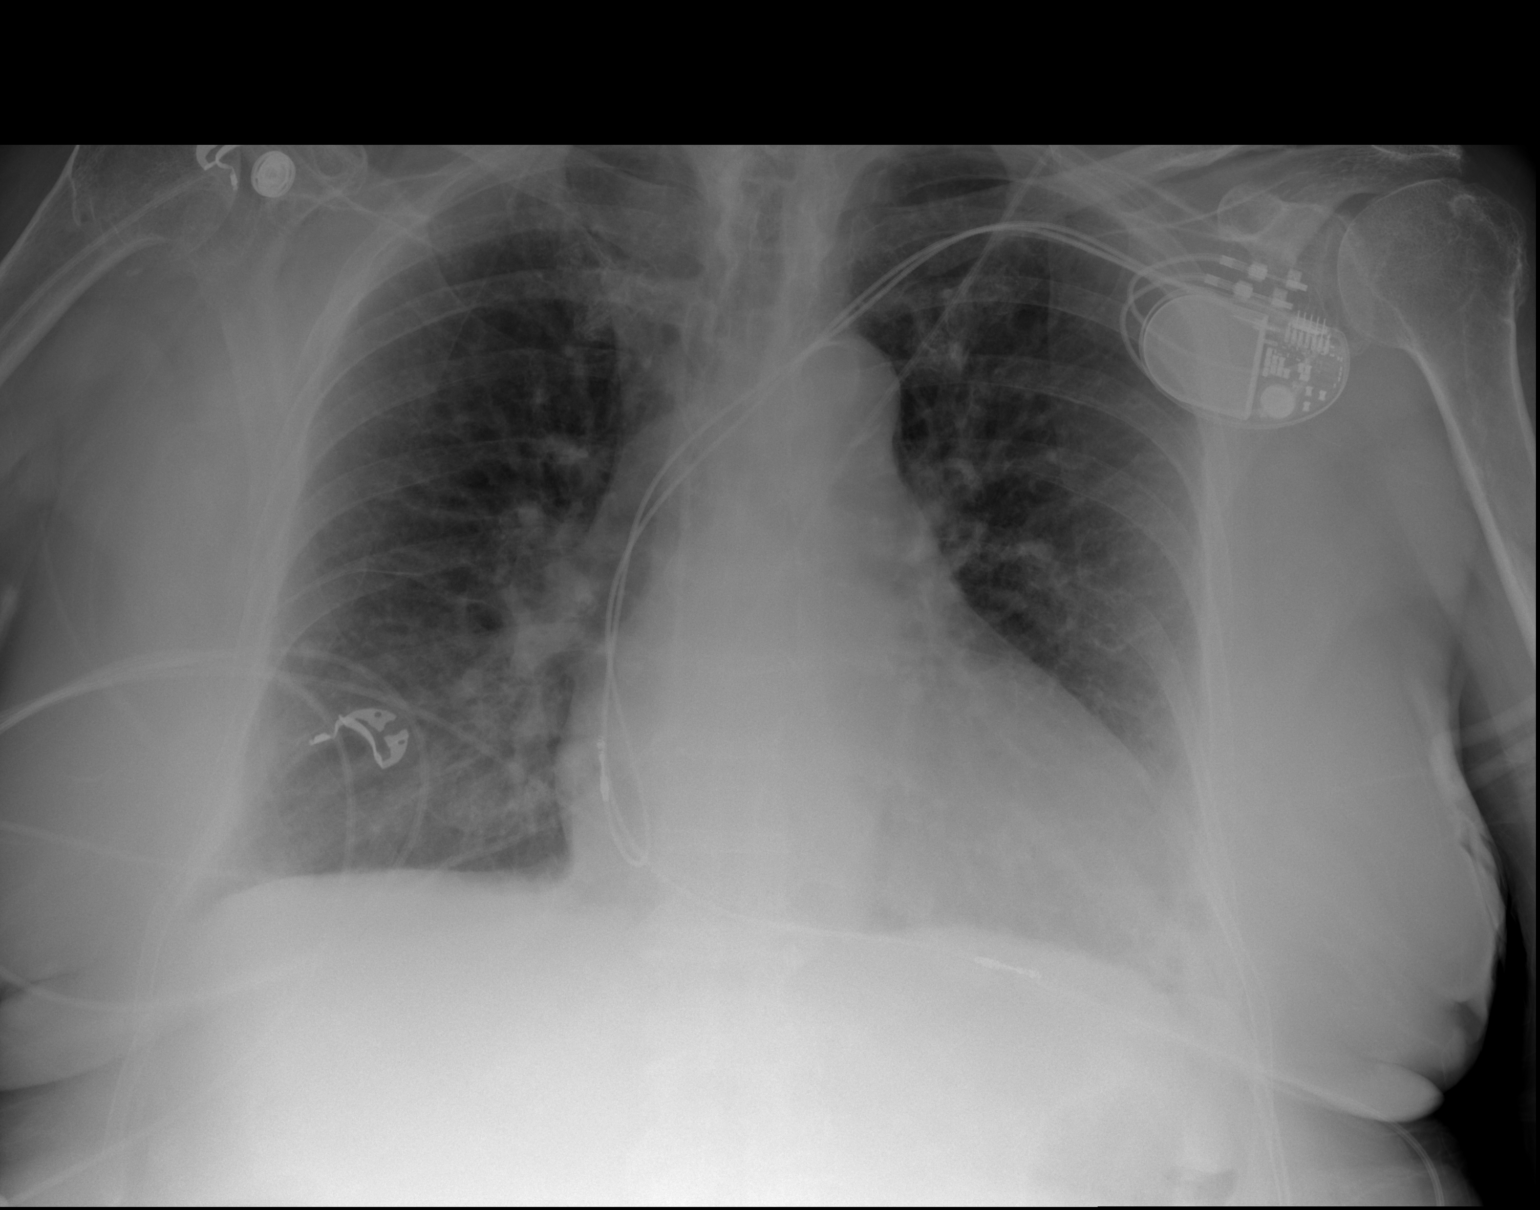

[2 of 2 positions shown; findings below may reference images not displayed]

FINDINGS: Do lead left-sided pacemaker is intact. Lungs are somewhat
hypoinflated without effusion. There is no evidence of left-sided
pneumothorax. Subtle opacification of the lateral left base likely
atelectasis. Borderline cardiomegaly unchanged. No calcification
over the aortic arch. Remainder the exam is unchanged.
IMPRESSION: Hypoinflation with minimal opacification over the lateral left base
likely atelectasis.

Stable borderline cardiomegaly.

Left-sided pacemaker.  No evidence of pneumothorax.

## 2015-02-12 ENCOUNTER — Ambulatory Visit
Admission: RE | Admit: 2015-02-12 | Discharge: 2015-02-12 | Disposition: A | Discharge status: Home / Self Care | Payer: Medicare Other | Source: Ambulatory Visit | Attending: Obstetrics | Admitting: Obstetrics

## 2015-02-12 DIAGNOSIS — R928 Other abnormal and inconclusive findings on diagnostic imaging of breast: Secondary | ICD-10-CM | POA: Diagnosis not present

## 2015-02-14 DIAGNOSIS — I10 Essential (primary) hypertension: Secondary | ICD-10-CM | POA: Diagnosis not present

## 2015-02-14 DIAGNOSIS — Z1389 Encounter for screening for other disorder: Secondary | ICD-10-CM | POA: Diagnosis not present

## 2015-02-14 DIAGNOSIS — F329 Major depressive disorder, single episode, unspecified: Secondary | ICD-10-CM | POA: Diagnosis not present

## 2015-02-14 DIAGNOSIS — Z0001 Encounter for general adult medical examination with abnormal findings: Secondary | ICD-10-CM | POA: Diagnosis not present

## 2015-02-14 DIAGNOSIS — M859 Disorder of bone density and structure, unspecified: Secondary | ICD-10-CM | POA: Diagnosis not present

## 2015-02-14 DIAGNOSIS — R600 Localized edema: Secondary | ICD-10-CM | POA: Diagnosis not present

## 2015-02-14 DIAGNOSIS — Z23 Encounter for immunization: Secondary | ICD-10-CM | POA: Diagnosis not present

## 2015-02-14 DIAGNOSIS — I252 Old myocardial infarction: Secondary | ICD-10-CM | POA: Diagnosis not present

## 2015-02-14 DIAGNOSIS — G4733 Obstructive sleep apnea (adult) (pediatric): Secondary | ICD-10-CM | POA: Diagnosis not present

## 2015-02-14 DIAGNOSIS — C50919 Malignant neoplasm of unspecified site of unspecified female breast: Secondary | ICD-10-CM | POA: Diagnosis not present

## 2015-02-14 DIAGNOSIS — Z79899 Other long term (current) drug therapy: Secondary | ICD-10-CM | POA: Diagnosis not present

## 2015-02-14 DIAGNOSIS — E782 Mixed hyperlipidemia: Secondary | ICD-10-CM | POA: Diagnosis not present

## 2015-03-14 ENCOUNTER — Other Ambulatory Visit: Payer: Self-pay | Admitting: Interventional Cardiology

## 2015-03-16 ENCOUNTER — Other Ambulatory Visit: Payer: Self-pay | Admitting: Interventional Cardiology

## 2015-03-17 ENCOUNTER — Other Ambulatory Visit: Payer: Self-pay

## 2015-03-17 MED ORDER — ROSUVASTATIN CALCIUM 10 MG PO TABS: 5.0000 mg | ORAL_TABLET | ORAL | Status: DC

## 2015-03-19 NOTE — Progress Notes (Signed)
Cardiology Office Note   Date:  03/20/2015   ID:  [name], DOB 06/24/1928, MRN [id number]  PCP:  Henrine Screws, MD    Chief Complaint  Patient presents with  . Sleep Apnea  . Hypertension      History of Present Illness: [name] is a 81 y.o. female with a history of OSA, obesity and HTN. She is doing well with her CPAP device. She feels rested in the am and has no daytime sleepiness. She tolerates the nasal mask well and feels the pressure is adequate. She has no daytime sleepiness and feels refreshed in the am.  She denies any mouth dryness.  She had a PPM placed for syncope with 2:1 heart block.     Past Medical History  Diagnosis Date  . Hypertension   . Myocardial infarction ([place])   . Breast cancer ([place])   . Rhinitis   . Osteopenia   . Rotator cuff syndrome   . Hyperlipemia   . GERD (gastroesophageal reflux disease)   . Hemorrhoids   . Colon polyps   . Macular degeneration   . Umbilical hernia   . Osteopenia   . Obstructive sleep apnea on CPAP   . Ulnar neuropathy   . Coronary artery disease 06/2006    STEMI inferior with BMS to mid RCA with mid basal inferior wall HK and luminal irrgularities elsewhere  . 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 Pacemaker 02/25/14  . SAH (subarachnoid hemorrhage) ([place]) 02/22/14  . LBBB (left bundle branch block)   . Unstable gait   . Fracture of femoral neck, right ([place]) 06/13/2011  . Fracture of humeral head, right, closed 06/13/2011    Past Surgical History  Procedure Laterality Date  . Shoulder surgery    . Breast lumpectomy    . Wrist surgery    . Hip arthroplasty  06/06/2011    Procedure: ARTHROPLASTY BIPOLAR HIP;  Surgeon: Gearlean Alf;  Location: [facility];  Service: Orthopedics;  Laterality: Right;  . Coronary angioplasty with stent placement  06/2006    Mid RCA BMS  . Permanent pacemaker insertion N/A 02/25/2014    Procedure: PERMANENT PACEMAKER INSERTION;  Surgeon: Evans Lance, MD;  Location: [facility] CATH LAB;  Service: Cardiovascular;  Laterality: N/A;     Current Outpatient Prescriptions  Medication Sig Dispense Refill  . aspirin EC 81 MG tablet Take 1 tablet (81 mg total) by mouth daily. 90 tablet 3  . Cholecalciferol (VITAMIN D3) 2000 UNITS TABS Take 1 tablet by mouth daily.    . clopidogrel (PLAVIX) 75 MG tablet Take 75 mg by mouth daily.    . Coenzyme Q10 (COQ10) 200 MG CAPS Take 1 tablet by mouth daily.    Marland Kitchen ezetimibe (ZETIA) 10 MG tablet Take 10 mg by mouth 2 (two) times a week. Mondays and fridays    . lisinopril (PRINIVIL,ZESTRIL) 20 MG tablet TAKE (1) TABLET DAILY. (Patient taking differently: TAKE (1) TABLET BY MOUTH DAILY.) 30 tablet 11  . Multiple Vitamins-Minerals (PRESERVISION/LUTEIN) CAPS Take 1 capsule by mouth 2 (two) times daily.    . nebivolol (BYSTOLIC) 5 MG tablet Take 1 tablet (5 mg total) by mouth daily. 30 tablet 11  . NITROSTAT 0.4 MG SL tablet USE AS DIRECTED FOR CHEST PAIN. 25 tablet 1  . rosuvastatin (CRESTOR) 10 MG tablet Take 0.5 tablets (5 mg total) by mouth 2 (two) times a  week. Tuesdays and Thursday 36 tablet 3   No current facility-administered medications for this visit.    Allergies:   Chlorpheniramine and Codeine    Social History:  The patient  reports that she has never smoked. She does not have any smokeless tobacco history on file. She reports that she drinks alcohol.   Family History:  The patient's family history includes CVA in her sister; Heart attack in her father.    ROS:  Please see the history of present illness.   Otherwise, review of systems are positive for none.   All other systems are reviewed and negative.    PHYSICAL EXAM: VS:  BP 130/80 mmHg  Pulse 71  Ht $R'5\' 4"'tV$  (1.626 m)  Wt 209 lb 9.6 oz (95.074 kg)  BMI 35.96 kg/m2 , BMI Body mass index is 35.96 kg/(m^2). GEN: Well nourished, well developed, in no acute distress HEENT: normal Neck: no JVD, carotid bruits, or masses Cardiac: RRR; no  murmurs, rubs, or gallops,no edema  Respiratory:  clear to auscultation bilaterally, normal work of breathing GI: soft, nontender, nondistended, + BS MS: no deformity or atrophy Skin: warm and dry, no rash Neuro:  Strength and sensation are intact Psych: euthymic mood, full affect   EKG:  EKG was ordered today showing AV paced rhythm with PVC's    Recent Labs: No results found for requested labs within last 365 days.    Lipid Panel No results found for: CHOL, TRIG, HDL, CHOLHDL, VLDL, LDLCALC, LDLDIRECT    Wt Readings from Last 3 Encounters:  03/20/15 209 lb 9.6 oz (95.074 kg)  06/10/14 210 lb 3.2 oz (95.346 kg)  05/28/14 216 lb (97.977 kg)     ASSESSMENT AND PLAN:  1. Obstructive Sleep Apnea doing well with current settings. -I will get a d/l from her DME 2. HTN - controlled - continue Lisinopril and Bystolic  3. Obesity - she will continue her water aerobics and Tai Chi  4. Syncope with 2:1 HB s/p PPM    Current medicines are reviewed at length with the patient today.  The patient does not have concerns regarding medicines.  The following changes have been made:  no change  Labs/ tests ordered today: See above Assessment and Plan No orders of the defined types were placed in this encounter.     Disposition:   FU with me in 1 year  Signed, Sueanne Margarita, MD  03/20/2015 10:32 AM    [facility] [address], [place], [place]  [address] Phone: [phone]; Fax: [phone]

## 2015-03-20 ENCOUNTER — Ambulatory Visit (INDEPENDENT_AMBULATORY_CARE_PROVIDER_SITE_OTHER): Payer: Medicare Other | Admitting: Internal Medicine

## 2015-03-20 ENCOUNTER — Encounter: Payer: Self-pay | Admitting: Internal Medicine

## 2015-03-20 ENCOUNTER — Encounter: Payer: Self-pay | Admitting: Cardiology

## 2015-03-20 ENCOUNTER — Ambulatory Visit (INDEPENDENT_AMBULATORY_CARE_PROVIDER_SITE_OTHER): Payer: Medicare Other | Admitting: Cardiology

## 2015-03-20 VITALS — BP 130/80 | HR 71 | Ht 64.0 in | Wt 209.6 lb

## 2015-03-20 VITALS — BP 130/71 | HR 71 | Ht 64.0 in | Wt 209.6 lb

## 2015-03-20 DIAGNOSIS — I459 Conduction disorder, unspecified: Secondary | ICD-10-CM

## 2015-03-20 DIAGNOSIS — I10 Essential (primary) hypertension: Secondary | ICD-10-CM

## 2015-03-20 DIAGNOSIS — I2510 Atherosclerotic heart disease of native coronary artery without angina pectoris: Secondary | ICD-10-CM

## 2015-03-20 DIAGNOSIS — Z9989 Dependence on other enabling machines and devices: Principal | ICD-10-CM

## 2015-03-20 DIAGNOSIS — G4733 Obstructive sleep apnea (adult) (pediatric): Secondary | ICD-10-CM

## 2015-03-20 DIAGNOSIS — E669 Obesity, unspecified: Secondary | ICD-10-CM

## 2015-03-20 DIAGNOSIS — Z950 Presence of cardiac pacemaker: Secondary | ICD-10-CM | POA: Diagnosis not present

## 2015-03-20 NOTE — Assessment & Plan Note (Signed)
Her blood pressure is well controlled. No change in meds. 

## 2015-03-20 NOTE — Assessment & Plan Note (Signed)
She has had no recurrent syncope since her PPM was placed. Will follow. 

## 2015-03-20 NOTE — Progress Notes (Signed)
HPI [name] returns today for followup of her PPM. She is a pleasant 81 yo woman with symptomatic bradycardia and 2:1 AV block and LBBB who underwent PPM insertion approximately 12 months ago. In the interim, she has had no recurrent syncope. She denies chest pain or sob. She has been bothered by peripheral edema, left more than right and had some cellulitis. No other complaints today.  Allergies  Allergen Reactions  . Chlorpheniramine Anaphylaxis  . Codeine Anaphylaxis     Current Outpatient Prescriptions  Medication Sig Dispense Refill  . aspirin EC 81 MG tablet Take 1 tablet (81 mg total) by mouth daily. 90 tablet 3  . Cholecalciferol (VITAMIN D3) 2000 UNITS TABS Take 1 tablet by mouth daily.    . clopidogrel (PLAVIX) 75 MG tablet Take 75 mg by mouth daily.    . Coenzyme Q10 (COQ10) 200 MG CAPS Take 1 tablet by mouth daily.    Marland Kitchen ezetimibe (ZETIA) 10 MG tablet Take 10 mg by mouth 2 (two) times a week. Mondays and fridays    . lisinopril (PRINIVIL,ZESTRIL) 20 MG tablet TAKE (1) TABLET DAILY. (Patient taking differently: TAKE (1) TABLET BY MOUTH DAILY.) 30 tablet 11  . Multiple Vitamins-Minerals (PRESERVISION/LUTEIN) CAPS Take 1 capsule by mouth 2 (two) times daily.    . nebivolol (BYSTOLIC) 5 MG tablet Take 1 tablet (5 mg total) by mouth daily. 30 tablet 11  . NITROSTAT 0.4 MG SL tablet USE AS DIRECTED FOR CHEST PAIN. 25 tablet 1  . rosuvastatin (CRESTOR) 10 MG tablet Take 0.5 tablets (5 mg total) by mouth 2 (two) times a week. Tuesdays and Thursday 36 tablet 3   No current facility-administered medications for this visit.     Past Medical History  Diagnosis Date  . Hypertension   . Myocardial infarction ([place])   . Breast cancer ([place])   . Rhinitis   . Osteopenia   . Rotator cuff syndrome   . Hyperlipemia   . GERD (gastroesophageal reflux disease)   . Hemorrhoids   . Colon polyps   . Macular degeneration   . Umbilical hernia   . Osteopenia   . Obstructive sleep  apnea on CPAP   . Ulnar neuropathy   . Coronary artery disease 06/2006    STEMI inferior with BMS to mid RCA with mid basal inferior wall HK and luminal irrgularities elsewhere  . 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 Pacemaker 02/25/14  . SAH (subarachnoid hemorrhage) ([place]) 02/22/14  . LBBB (left bundle branch block)   . Unstable gait   . Fracture of femoral neck, right ([place]) 06/13/2011  . Fracture of humeral head, right, closed 06/13/2011    ROS:   All systems reviewed and negative except as noted in the HPI.   Past Surgical History  Procedure Laterality Date  . Shoulder surgery    . Breast lumpectomy    . Wrist surgery    . Hip arthroplasty  06/06/2011    Procedure: ARTHROPLASTY BIPOLAR HIP;  Surgeon: Gearlean Alf;  [facility]: [facility];  Service: Orthopedics;  Laterality: Right;  . Coronary angioplasty with stent placement  06/2006    Mid RCA BMS  . Permanent pacemaker insertion N/A 02/25/2014    Procedure: PERMANENT PACEMAKER INSERTION;  Surgeon: Evans Lance, MD;  Location: [facility] CATH LAB;  Service: Cardiovascular;  Laterality: N/A;     Family History  Problem Relation Age of Onset  . Heart attack Father   . CVA Sister  Social History   Social History  . Marital Status: Married    Spouse Name: N/A  . Number of Children: N/A  . Years of Education: N/A   Occupational History  . Not on file.   Social History Main Topics  . Smoking status: Never Smoker   . Smokeless tobacco: Not on file  . Alcohol Use: Yes     Comment: occasional wine  . Drug Use: Not on file  . Sexual Activity: Not on file   Other Topics Concern  . Not on file   Social History Narrative     BP 130/71 mmHg  Pulse 71  Ht 5\' 4"  (1.626 m)  Wt 209 lb 9.6 oz (95.074 kg)  BMI 35.96 kg/m2  Physical Exam:  Well appearing 81 yo woman, NAD, alopecia. HEENT: Unremarkable Neck:  No JVD, no thyromegally Back:  No CVA tenderness Lungs:  Clear with no wheezes; well healed PPM incision. HEART:   Regular rate rhythm, no murmurs, no rubs, no clicks Abd:  soft, positive bowel sounds, no organomegally, no rebound, no guarding Ext:  2 plus pulses, no edema, no cyanosis, no clubbing Skin:  No rashes no nodules Neuro:  CN II through XII intact, motor grossly intact  ECG - NSR with LBBB and PVC"s.   DEVICE  Normal device function.  See PaceArt for details.   Assess/Plan:

## 2015-03-20 NOTE — Patient Instructions (Signed)
Medication Instructions:  Your physician recommends that you continue on your current medications as directed. Please refer to the Current Medication list given to you today.  Labwork: None ordered  Testing/Procedures: None ordered  Follow-Up: Remote monitoring is used to monitor your Pacemaker of ICD from home. This monitoring reduces the number of office visits required to check your device to one time per year. It allows us to keep an eye on the functioning of your device to ensure it is working properly. You are scheduled for a device check from home on 06/19/2015. You may send your transmission at any time that day. If you have a wireless device, the transmission will be sent automatically. After your physician reviews your transmission, you will receive a postcard with your next transmission date.  Your physician wants you to follow-up in: 1 year with Dr. Taylor.  You will receive a reminder letter in the mail two months in advance. If you don't receive a letter, please call our office to schedule the follow-up appointment.   Any Other Special Instructions Will Be Listed Below (If Applicable).  If you need a refill on your cardiac medications before your next appointment, please call your pharmacy.  Thank you for choosing [facility]!!         

## 2015-03-20 NOTE — Patient Instructions (Signed)
Medication Instructions:  Your physician recommends that you continue on your current medications as directed. Please refer to the Current Medication list given to you today.   Labwork: None  Testing/Procedures: None  Follow-Up: Your physician recommends that you schedule a follow-up appointment with Dr. Irish Lack on 03/27/15 at 9:45 AM.  Your physician wants you to follow-up in: 1 year with Dr. Radford Pax. You will receive a reminder letter in the mail two months in advance. If you don't receive a letter, please call our office to schedule the follow-up appointment.   Any Other Special Instructions Will Be Listed Below (If Applicable).     If you need a refill on your cardiac medications before your next appointment, please call your pharmacy.

## 2015-03-20 NOTE — Assessment & Plan Note (Signed)
Her [employer] DDD PM is working normally. Will recheck in several months.  

## 2015-03-25 ENCOUNTER — Telehealth: Payer: Self-pay | Admitting: Cardiology

## 2015-03-25 LAB — CUP PACEART INCLINIC DEVICE CHECK
Battery Remaining Longevity: 118.8
Battery Voltage: 3.01 V
Brady Statistic RV Percent Paced: 16 %
Date Time Interrogation Session: 20161027145906
Implantable Lead Implant Date: 20151005
Implantable Lead Implant Date: 20151005
Implantable Lead Location: 753859
Lead Channel Impedance Value: 512.5 Ohm
Lead Channel Impedance Value: 725 Ohm
Lead Channel Pacing Threshold Amplitude: 0.625 V
Lead Channel Pacing Threshold Amplitude: 1 V
Lead Channel Sensing Intrinsic Amplitude: 2 mV
Lead Channel Setting Pacing Amplitude: 0.875
Lead Channel Setting Pacing Amplitude: 2 V
Lead Channel Setting Sensing Sensitivity: 4 mV
MDC IDC LEAD LOCATION: 753860
MDC IDC MSMT LEADCHNL RA PACING THRESHOLD PULSEWIDTH: 0.5 ms
MDC IDC MSMT LEADCHNL RV PACING THRESHOLD PULSEWIDTH: 0.5 ms
MDC IDC MSMT LEADCHNL RV SENSING INTR AMPL: 12 mV
MDC IDC PG SERIAL: 7666288
MDC IDC SET LEADCHNL RV PACING PULSEWIDTH: 0.5 ms
MDC IDC STAT BRADY RA PERCENT PACED: 73 %
Pulse Gen Model: 2240

## 2015-03-25 NOTE — Telephone Encounter (Signed)
Left message for patient to call back  

## 2015-03-25 NOTE — Telephone Encounter (Signed)
New message      Please call pt and give her an update on Andy fixing her cpap machine.  She has not heard from him or anyone at [facility].  Please call

## 2015-03-26 NOTE — Progress Notes (Signed)
Patient ID: [name], female   DOB: 1928-12-22, 81 y.o.   MRN: [id number]     Cardiology Office Note   Date:  03/26/2015   ID:  [name], DOB 08-29-1928, MRN [id number]  PCP:  Henrine Screws, MD    No chief complaint on file. CAD   Wt Readings from Last 3 Encounters:  03/20/15 209 lb 9.6 oz (95.074 kg)  03/20/15 209 lb 9.6 oz (95.074 kg)  06/10/14 210 lb 3.2 oz (95.346 kg)       History of Present Illness: [name] is a 81 y.o. female who had an inferior MI in 2008. She has been doing well. Mild exercise in the pool. She had A BMS to the RCA at that time. CAD/ASCVD:  She is using CPAP. Now lives in [place].  CPAP is not working well for the past month. Denies : Chest pain.  Dizziness.   Fatigue.  Leg edema.  Nitroglycerin.  Orthopnea.  Palpitations.  Paroxysmal nocturnal dyspnea.  Syncope.    She noted some DOE with walking.  She uses a walker when she walks.   No problems when she works out at the fitness center.    Her husband has been affected by a couple of strokes.  It has been stressful for her caring for him.    Past Medical History  Diagnosis Date  . Hypertension   . Myocardial infarction ([place])   . Breast cancer ([place])   . Rhinitis   . Osteopenia   . Rotator cuff syndrome   . Hyperlipemia   . GERD (gastroesophageal reflux disease)   . Hemorrhoids   . Colon polyps   . Macular degeneration   . Umbilical hernia   . Osteopenia   . Obstructive sleep apnea on CPAP   . Ulnar neuropathy   . Coronary artery disease 06/2006    STEMI inferior with BMS to mid RCA with mid basal inferior wall HK and luminal irrgularities elsewhere  . 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 Pacemaker 02/25/14  . SAH (subarachnoid hemorrhage) ([place]) 02/22/14  . LBBB (left bundle branch block)   . Unstable gait   . Fracture of femoral neck, right ([place]) 06/13/2011  . Fracture of humeral head, right, closed 06/13/2011    Past Surgical History  Procedure  Laterality Date  . Shoulder surgery    . Breast lumpectomy    . Wrist surgery    . Hip arthroplasty  06/06/2011    Procedure: ARTHROPLASTY BIPOLAR HIP;  Surgeon: Gearlean Alf;  Location: [facility];  Service: Orthopedics;  Laterality: Right;  . Coronary angioplasty with stent placement  06/2006    Mid RCA BMS  . Permanent pacemaker insertion N/A 02/25/2014    Procedure: PERMANENT PACEMAKER INSERTION;  Surgeon: Evans Lance, MD;  Location: [facility] CATH LAB;  Service: Cardiovascular;  Laterality: N/A;     Current Outpatient Prescriptions  Medication Sig Dispense Refill  . aspirin EC 81 MG tablet Take 1 tablet (81 mg total) by mouth daily. 90 tablet 3  . Cholecalciferol (VITAMIN D3) 2000 UNITS TABS Take 1 tablet by mouth daily.    . clopidogrel (PLAVIX) 75 MG tablet Take 75 mg by mouth daily.    . Coenzyme Q10 (COQ10) 200 MG CAPS Take 1 tablet by mouth daily.    Marland Kitchen ezetimibe (ZETIA) 10 MG tablet Take 10 mg by mouth 2 (two) times a week. Mondays and fridays    . lisinopril (PRINIVIL,ZESTRIL) 20 MG tablet TAKE (1) TABLET  DAILY. (Patient taking differently: TAKE (1) TABLET BY MOUTH DAILY.) 30 tablet 11  . Multiple Vitamins-Minerals (PRESERVISION/LUTEIN) CAPS Take 1 capsule by mouth 2 (two) times daily.    . nebivolol (BYSTOLIC) 5 MG tablet Take 1 tablet (5 mg total) by mouth daily. 30 tablet 11  . NITROSTAT 0.4 MG SL tablet USE AS DIRECTED FOR CHEST PAIN. 25 tablet 1  . rosuvastatin (CRESTOR) 10 MG tablet Take 0.5 tablets (5 mg total) by mouth 2 (two) times a week. Tuesdays and Thursday 36 tablet 3   No current facility-administered medications for this visit.    Allergies:   Chlorpheniramine and Codeine    Social History:  The patient  reports that she has never smoked. She does not have any smokeless tobacco history on file. She reports that she drinks alcohol.   Family History:  The patient's family history includes CVA in her sister; Heart attack in her father.    ROS:  Please see the  history of present illness.   Otherwise, review of systems are positive for mild DOE.   All other systems are reviewed and negative.    PHYSICAL EXAM: VS:  There were no vitals taken for this visit. , BMI There is no weight on file to calculate BMI. GEN: Well nourished, well developed, in no acute distress HEENT: normal Neck: no JVD, carotid bruits, or masses Cardiac: RRR; no murmurs, rubs, or gallops,mild bilateral ankle edema  Respiratory:  clear to auscultation bilaterally, normal work of breathing GI: soft, nontender, nondistended, + BS MS: no deformity or atrophy Skin: warm and dry, no rash Neuro:  Strength and sensation are intact Psych: euthymic mood, full affect   EKG:   The ekg ordered today demonstrates atrial pacing, LBBB,    Recent Labs: No results found for requested labs within last 365 days.   Lipid Panel No results found for: CHOL, TRIG, HDL, CHOLHDL, VLDL, LDLCALC, LDLDIRECT   Other studies Reviewed: Additional studies/ records that were reviewed today with results demonstrating: 2011 echo: Normla LV function.  Mild vlavular regurgitation with no indication for SBRE prophylaxis.  .   ASSESSMENT AND PLAN:  1. CAD/Old MI: No chest pain like her MI.  No evidence of fluid overload.  She is wondering whether she needs clopidogrel.  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  According to our last echo in 2011, she does not not need SBE prophylaxis.  Hold off on clopidogrel.  COntinue aspirin for now.     2.   Hyperlipidemia: Crestor Chauncey Cruel . HTN well controlled at home.  3.  She has some calcium deposit in her breasts as well.  THis will be followed in March 2017.  4. SHOB:  She does well with a heavy workout that makes her sweat.  SHe thinkds the [facility] with the walk is due to stress of taking care of her husband.  5.  Edema:  Elevate legs,.  She has a prn diuretic per PMD but has not used this.   Venous insuffiency   Current medicines are  reviewed at length with the patient today.  The patient concerns regarding her medicines were addressed.  The following changes have been made:  No change  Labs/ tests ordered today include:  No orders of the defined types were placed in this encounter.    Recommend 150 minutes/week of aerobic exercise Low fat, low carb, high fiber diet recommended  Disposition:   FU in 1 year   Signed,  Jettie Booze., MD  03/26/2015 10:30 PM    [place], [place], [place]  [address] Phone: [phone]; Fax: [phone]

## 2015-03-27 ENCOUNTER — Ambulatory Visit (INDEPENDENT_AMBULATORY_CARE_PROVIDER_SITE_OTHER): Payer: Medicare Other | Admitting: Interventional Cardiology

## 2015-03-27 ENCOUNTER — Encounter: Payer: Self-pay | Admitting: Interventional Cardiology

## 2015-03-27 VITALS — BP 146/66 | HR 71 | Ht 64.0 in | Wt 207.8 lb

## 2015-03-27 DIAGNOSIS — I252 Old myocardial infarction: Secondary | ICD-10-CM

## 2015-03-27 DIAGNOSIS — R0602 Shortness of breath: Secondary | ICD-10-CM | POA: Diagnosis not present

## 2015-03-27 DIAGNOSIS — I2510 Atherosclerotic heart disease of native coronary artery without angina pectoris: Secondary | ICD-10-CM

## 2015-03-27 DIAGNOSIS — I872 Venous insufficiency (chronic) (peripheral): Secondary | ICD-10-CM | POA: Diagnosis not present

## 2015-03-27 DIAGNOSIS — E785 Hyperlipidemia, unspecified: Secondary | ICD-10-CM

## 2015-03-27 DIAGNOSIS — I10 Essential (primary) hypertension: Secondary | ICD-10-CM

## 2015-03-27 NOTE — Patient Instructions (Signed)
Medication Instructions:  None  Labwork: None  Testing/Procedures: None  Follow-Up: Your physician wants you to follow-up in: 1 year with Dr. Irish Lack. You will receive a reminder letter in the mail or a phone call two months in advance. If you don't receive a letter or a phone call, please call our office to schedule the follow-up appointment.   Any Other Special Instructions Will Be Listed Below (If Applicable).     If you need a refill on your cardiac medications before your next appointment, please call your pharmacy.

## 2015-03-28 NOTE — Telephone Encounter (Signed)
Left message for patient that if she needed to still discuss her CPAP machine with me she could call me at [phone]

## 2015-04-07 ENCOUNTER — Encounter: Payer: Self-pay | Admitting: Cardiology

## 2015-06-19 ENCOUNTER — Telehealth: Payer: Self-pay | Admitting: Internal Medicine

## 2015-06-19 ENCOUNTER — Encounter: Payer: Medicare Other | Admitting: *Deleted

## 2015-06-19 NOTE — Telephone Encounter (Signed)
Spoke w/ pt and attempted to help her send transmission. After several unsuccessful attempts I instructed pt to call tech services to help her trouble home monitor.

## 2015-06-19 NOTE — Telephone Encounter (Signed)
New message     Calling to get someone to help her with her remote transmission.  She will only be home this am.

## 2015-06-21 ENCOUNTER — Other Ambulatory Visit: Payer: Self-pay | Admitting: Interventional Cardiology

## 2015-06-21 ENCOUNTER — Other Ambulatory Visit: Payer: Self-pay | Admitting: Internal Medicine

## 2015-07-14 DIAGNOSIS — H353132 Nonexudative age-related macular degeneration, bilateral, intermediate dry stage: Secondary | ICD-10-CM | POA: Diagnosis not present

## 2015-07-14 DIAGNOSIS — H524 Presbyopia: Secondary | ICD-10-CM | POA: Diagnosis not present

## 2015-07-14 DIAGNOSIS — H43813 Vitreous degeneration, bilateral: Secondary | ICD-10-CM | POA: Diagnosis not present

## 2015-07-14 DIAGNOSIS — Z961 Presence of intraocular lens: Secondary | ICD-10-CM | POA: Diagnosis not present

## 2015-07-31 DIAGNOSIS — H353132 Nonexudative age-related macular degeneration, bilateral, intermediate dry stage: Secondary | ICD-10-CM | POA: Diagnosis not present

## 2015-07-31 DIAGNOSIS — H43813 Vitreous degeneration, bilateral: Secondary | ICD-10-CM | POA: Diagnosis not present

## 2015-09-19 DIAGNOSIS — R269 Unspecified abnormalities of gait and mobility: Secondary | ICD-10-CM | POA: Diagnosis not present

## 2015-10-17 DIAGNOSIS — L821 Other seborrheic keratosis: Secondary | ICD-10-CM | POA: Diagnosis not present

## 2015-10-17 DIAGNOSIS — I8312 Varicose veins of left lower extremity with inflammation: Secondary | ICD-10-CM | POA: Diagnosis not present

## 2015-10-17 DIAGNOSIS — D2261 Melanocytic nevi of right upper limb, including shoulder: Secondary | ICD-10-CM | POA: Diagnosis not present

## 2015-10-17 DIAGNOSIS — I8311 Varicose veins of right lower extremity with inflammation: Secondary | ICD-10-CM | POA: Diagnosis not present

## 2015-10-17 DIAGNOSIS — I872 Venous insufficiency (chronic) (peripheral): Secondary | ICD-10-CM | POA: Diagnosis not present

## 2015-10-17 DIAGNOSIS — L918 Other hypertrophic disorders of the skin: Secondary | ICD-10-CM | POA: Diagnosis not present

## 2015-10-17 DIAGNOSIS — L905 Scar conditions and fibrosis of skin: Secondary | ICD-10-CM | POA: Diagnosis not present

## 2015-10-17 DIAGNOSIS — L814 Other melanin hyperpigmentation: Secondary | ICD-10-CM | POA: Diagnosis not present

## 2015-10-17 DIAGNOSIS — D224 Melanocytic nevi of scalp and neck: Secondary | ICD-10-CM | POA: Diagnosis not present

## 2015-10-22 ENCOUNTER — Encounter: Payer: Self-pay | Admitting: Internal Medicine

## 2015-10-22 ENCOUNTER — Ambulatory Visit (INDEPENDENT_AMBULATORY_CARE_PROVIDER_SITE_OTHER): Payer: Medicare Other | Admitting: Internal Medicine

## 2015-10-22 VITALS — BP 122/82 | HR 61 | Ht 65.0 in | Wt 210.4 lb

## 2015-10-22 DIAGNOSIS — I5032 Chronic diastolic (congestive) heart failure: Secondary | ICD-10-CM | POA: Diagnosis not present

## 2015-10-22 DIAGNOSIS — Z950 Presence of cardiac pacemaker: Secondary | ICD-10-CM

## 2015-10-22 NOTE — Patient Instructions (Signed)
Medication Instructions:  Your physician recommends that you continue on your current medications as directed. Please refer to the Current Medication list given to you today.    Labwork: None ordered   Testing/Procedures: None ordered   Follow-Up: Your physician wants you to follow-up in: 12 months with Dr Knox Saliva will receive a reminder letter in the mail two months in advance. If you don't receive a letter, please call our office to schedule the follow-up appointment.  Remote monitoring is used to monitor your Pacemaker from home. This monitoring reduces the number of office visits required to check your device to one time per year. It allows [place] to keep an eye on the functioning of your device to ensure it is working properly. You are scheduled for a device check from home on 01/21/16. You may send your transmission at any time that day. If you have a wireless device, the transmission will be sent automatically. After your physician reviews your transmission, you will receive a postcard with your next transmission date.     Any Other Special Instructions Will Be Listed Below (If Applicable).     If you need a refill on your cardiac medications before your next appointment, please call your pharmacy.

## 2015-10-22 NOTE — Progress Notes (Signed)
HPI [name] returns today for followup of her PPM. She is a pleasant 82 yo woman with symptomatic bradycardia and 2:1 AV block and LBBB who underwent PPM insertion approximately 18 months ago. In the interim, she has had no recurrent syncope. She denies chest pain or sob. She has been bothered by peripheral edema, left more than right and had some cellulitis. No other complaints today. She is now on lasix. Allergies  Allergen Reactions  . Chlorpheniramine Anaphylaxis  . Codeine Anaphylaxis     Current Outpatient Prescriptions  Medication Sig Dispense Refill  . aspirin EC 81 MG tablet Take 1 tablet (81 mg total) by mouth daily. 90 tablet 3  . BYSTOLIC 5 MG tablet TAKE 1 TABLET ONCE DAILY. 30 tablet 9  . Cholecalciferol (VITAMIN D3) 2000 UNITS TABS Take 1 tablet by mouth daily.    . clopidogrel (PLAVIX) 75 MG tablet Take 75 mg by mouth daily.    . Coenzyme Q10 (COQ10) 200 MG CAPS Take 1 tablet by mouth daily.    Marland Kitchen ezetimibe (ZETIA) 10 MG tablet Take 10 mg by mouth 2 (two) times a week. Mondays and fridays    . furosemide (LASIX) 20 MG tablet Take 20 mg by mouth daily.    Marland Kitchen lisinopril (PRINIVIL,ZESTRIL) 20 MG tablet TAKE 1 TABLET DAILY FOR BLOOD PRESSURE. 60 tablet 4  . Multiple Vitamins-Minerals (PRESERVISION/LUTEIN) CAPS Take 1 capsule by mouth 2 (two) times daily.    . nitroGLYCERIN (NITROSTAT) 0.4 MG SL tablet Place 0.4 mg under the tongue every 5 (five) minutes as needed for chest pain.    Marland Kitchen Potassium (POTASSIMIN PO) Take 1 tablet by mouth daily.    . rosuvastatin (CRESTOR) 10 MG tablet Take 0.5 tablets (5 mg total) by mouth 2 (two) times a week. Tuesdays and Thursday 36 tablet 3  . triamcinolone cream (KENALOG) 0.1 % Apply 1 application topically as directed.     No current facility-administered medications for this visit.     Past Medical History  Diagnosis Date  . Hypertension   . Myocardial infarction ([place])   . Breast cancer ([place])   . Rhinitis   . Osteopenia   .  Rotator cuff syndrome   . Hyperlipemia   . GERD (gastroesophageal reflux disease)   . Hemorrhoids   . Colon polyps   . Macular degeneration   . Umbilical hernia   . Osteopenia   . Obstructive sleep apnea on CPAP   . Ulnar neuropathy   . Coronary artery disease 06/2006    STEMI inferior with BMS to mid RCA with mid basal inferior wall HK and luminal irrgularities elsewhere  . 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 Pacemaker 02/25/14  . SAH (subarachnoid hemorrhage) ([place]) 02/22/14  . LBBB (left bundle branch block)   . Unstable gait   . Fracture of femoral neck, right ([place]) 06/13/2011  . Fracture of humeral head, right, closed 06/13/2011    ROS:   All systems reviewed and negative except as noted in the HPI.   Past Surgical History  Procedure Laterality Date  . Shoulder surgery    . Breast lumpectomy    . Wrist surgery    . Hip arthroplasty  06/06/2011    Procedure: ARTHROPLASTY BIPOLAR HIP;  Surgeon: Gearlean Alf;  Location: [facility];  Service: Orthopedics;  Laterality: Right;  . Coronary angioplasty with stent placement  06/2006    Mid RCA BMS  . Permanent pacemaker insertion N/A 02/25/2014    Procedure:  PERMANENT PACEMAKER INSERTION;  Surgeon: Evans Lance, MD;  Location: [facility] CATH LAB;  Service: Cardiovascular;  Laterality: N/A;     Family History  Problem Relation Age of Onset  . Heart attack Father   . CVA Sister      Social History   Social History  . Marital Status: Married    Spouse Name: N/A  . Number of Children: N/A  . Years of Education: N/A   Occupational History  . Not on file.   Social History Main Topics  . Smoking status: Never Smoker   . Smokeless tobacco: Never Used  . Alcohol Use: 0.0 oz/week    0 Standard drinks or equivalent per week     Comment: occasional wine  . Drug Use: Not on file  . Sexual Activity: Not on file   Other Topics Concern  . Not on file   Social History Narrative     BP 122/82 mmHg  Pulse 61  Ht 5\' 5"  (1.651 m)  Wt  210 lb 6.4 oz (95.437 kg)  BMI 35.01 kg/m2  SpO2 97%  Physical Exam:  Well appearing 82 yo woman, NAD, alopecia. HEENT: Unremarkable Neck:  6 cm JVD, no thyromegally Back:  No CVA tenderness Lungs:  Clear with no wheezes; well healed PPM incision. HEART:  Regular rate rhythm, no murmurs, no rubs, no clicks Abd:  soft, positive bowel sounds, no organomegally, no rebound, no guarding Ext:  2 plus pulses, no edema, no cyanosis, no clubbing Skin:  No rashes no nodules Neuro:  CN II through XII intact, motor grossly intact  ECG - NSR with LBBB and PVC"s.   DEVICE  Normal device function.  See PaceArt for details.   Assess/Plan: 1. 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 Will follow 2. Chronic diastolic heart failure - she is class 2. She will continue her lasix and ACE inhibitor. 3. HTN - her blood pressure is well controlled. No change in meds. 4. PPM - her St. Jude DDD PM is working normally. Will recheck in several months.  Mikle Bosworth.D.

## 2015-10-23 LAB — CUP PACEART INCLINIC DEVICE CHECK
Battery Remaining Longevity: 126
Battery Voltage: 3.01 V
Brady Statistic RA Percent Paced: 67 %
Brady Statistic RV Percent Paced: 25 %
Date Time Interrogation Session: 20170531161953
Implantable Lead Implant Date: 20151005
Implantable Lead Implant Date: 20151005
Implantable Lead Location: 753859
Implantable Lead Location: 753860
Lead Channel Impedance Value: 512.5 Ohm
Lead Channel Impedance Value: 687.5 Ohm
Lead Channel Pacing Threshold Amplitude: 0.75 V
Lead Channel Pacing Threshold Amplitude: 1 V
Lead Channel Pacing Threshold Pulse Width: 0.5 ms
Lead Channel Pacing Threshold Pulse Width: 0.5 ms
Lead Channel Sensing Intrinsic Amplitude: 12 mV
Lead Channel Sensing Intrinsic Amplitude: 3.2 mV
Lead Channel Setting Pacing Amplitude: 0.875
Lead Channel Setting Pacing Amplitude: 1.875
Lead Channel Setting Pacing Pulse Width: 0.5 ms
Lead Channel Setting Sensing Sensitivity: 4 mV
Pulse Gen Model: 2240
Pulse Gen Serial Number: 7666288

## 2015-11-14 ENCOUNTER — Other Ambulatory Visit: Payer: Self-pay | Admitting: Obstetrics

## 2015-11-14 DIAGNOSIS — R921 Mammographic calcification found on diagnostic imaging of breast: Secondary | ICD-10-CM

## 2015-12-04 DIAGNOSIS — I8312 Varicose veins of left lower extremity with inflammation: Secondary | ICD-10-CM | POA: Diagnosis not present

## 2015-12-04 DIAGNOSIS — I872 Venous insufficiency (chronic) (peripheral): Secondary | ICD-10-CM | POA: Diagnosis not present

## 2015-12-04 DIAGNOSIS — I8311 Varicose veins of right lower extremity with inflammation: Secondary | ICD-10-CM | POA: Diagnosis not present

## 2015-12-05 DIAGNOSIS — M25562 Pain in left knee: Secondary | ICD-10-CM | POA: Diagnosis not present

## 2015-12-10 DIAGNOSIS — M25561 Pain in right knee: Secondary | ICD-10-CM | POA: Diagnosis not present

## 2015-12-10 DIAGNOSIS — M1711 Unilateral primary osteoarthritis, right knee: Secondary | ICD-10-CM | POA: Diagnosis not present

## 2015-12-12 ENCOUNTER — Ambulatory Visit
Admission: RE | Admit: 2015-12-12 | Discharge: 2015-12-12 | Disposition: A | Discharge status: Home / Self Care | Payer: Medicare Other | Source: Ambulatory Visit | Attending: Obstetrics | Admitting: Obstetrics

## 2015-12-12 DIAGNOSIS — R921 Mammographic calcification found on diagnostic imaging of breast: Secondary | ICD-10-CM

## 2016-01-21 ENCOUNTER — Telehealth: Payer: Self-pay | Admitting: Cardiology

## 2016-01-21 ENCOUNTER — Ambulatory Visit (INDEPENDENT_AMBULATORY_CARE_PROVIDER_SITE_OTHER): Payer: Medicare Other | Admitting: *Deleted

## 2016-01-21 DIAGNOSIS — Z950 Presence of cardiac pacemaker: Secondary | ICD-10-CM | POA: Diagnosis not present

## 2016-01-21 DIAGNOSIS — R55 Syncope and collapse: Secondary | ICD-10-CM

## 2016-01-21 NOTE — Progress Notes (Signed)
Remote pacemaker transmission.   

## 2016-01-21 NOTE — Telephone Encounter (Signed)
LMOVM reminding pt to send remote transmission.   

## 2016-01-23 ENCOUNTER — Encounter: Payer: Self-pay | Admitting: Cardiology

## 2016-01-29 DIAGNOSIS — H353122 Nonexudative age-related macular degeneration, left eye, intermediate dry stage: Secondary | ICD-10-CM | POA: Diagnosis not present

## 2016-01-29 DIAGNOSIS — H353112 Nonexudative age-related macular degeneration, right eye, intermediate dry stage: Secondary | ICD-10-CM | POA: Diagnosis not present

## 2016-02-03 LAB — CUP PACEART REMOTE DEVICE CHECK
Battery Remaining Longevity: 124 mo
Battery Remaining Percentage: 95.5 %
Brady Statistic AP VS Percent: 46 %
Brady Statistic RA Percent Paced: 65 %
Brady Statistic RV Percent Paced: 30 %
Date Time Interrogation Session: 20170830163856
Implantable Lead Implant Date: 20151005
Implantable Lead Location: 753860
Lead Channel Impedance Value: 480 Ohm
Lead Channel Pacing Threshold Pulse Width: 0.5 ms
Lead Channel Sensing Intrinsic Amplitude: 5 mV
Lead Channel Setting Sensing Sensitivity: 4 mV
MDC IDC LEAD IMPLANT DT: 20151005
MDC IDC LEAD LOCATION: 753859
MDC IDC MSMT BATTERY VOLTAGE: 3.01 V
MDC IDC MSMT LEADCHNL RA PACING THRESHOLD AMPLITUDE: 0.875 V
MDC IDC MSMT LEADCHNL RV IMPEDANCE VALUE: 660 Ohm
MDC IDC MSMT LEADCHNL RV PACING THRESHOLD AMPLITUDE: 0.625 V
MDC IDC MSMT LEADCHNL RV PACING THRESHOLD PULSEWIDTH: 0.5 ms
MDC IDC MSMT LEADCHNL RV SENSING INTR AMPL: 12 mV
MDC IDC PG SERIAL: 7666288
MDC IDC SET LEADCHNL RA PACING AMPLITUDE: 1.875
MDC IDC SET LEADCHNL RV PACING AMPLITUDE: 0.875
MDC IDC SET LEADCHNL RV PACING PULSEWIDTH: 0.5 ms
MDC IDC STAT BRADY AP VP PERCENT: 24 %
MDC IDC STAT BRADY AS VP PERCENT: 5.7 %
MDC IDC STAT BRADY AS VS PERCENT: 19 %

## 2016-02-11 ENCOUNTER — Telehealth: Payer: Self-pay | Admitting: Interventional Cardiology

## 2016-02-11 NOTE — Telephone Encounter (Signed)
OK to increase lisinopril to 40 mg daily.  She will need a BMeet one week after the change.

## 2016-02-11 NOTE — Telephone Encounter (Signed)
New Message:    Pt 's blood pressure is getting elevated and also having problem with her mobility.Please call to advise.

## 2016-02-11 NOTE — Telephone Encounter (Signed)
Nurse, [name], from [facility] calling stating she was called to pt's room due to her falling.  States [name] lives in independent living.  She states [name] states her knees got weak and just lost her balance and fell.  [name] states she is not dizzy, no weakness and is alert and oriented.  States she is concerned that her BP are slightly elevated.  She came to her room about 3:30 and 1st BP was 158/66 ; 160/68; 170/70 and 160/70.  She took BP's about 5-10 min apart.  Advised Dr. Irish Lack not in office today and will route message to him and his nurse Jeani Hawking Via,LPN to see if they want to adjust medication. Advised her that these were after a fall and he may not want to change and should take BP's on daily basis. She states [name] administers her own medications and to call her back.

## 2016-02-12 NOTE — Telephone Encounter (Signed)
LMTCB

## 2016-02-17 MED ORDER — LISINOPRIL 40 MG PO TABS: 40.0000 mg | ORAL_TABLET | Freq: Every day | ORAL | 3 refills | Status: DC

## 2016-02-17 NOTE — Telephone Encounter (Addendum)
**Note De-Identified  Obfuscation** The pt is advised and she verbalized understanding. Per her request I have sent her Lisinopril 40 mg to [facility] to fill #30 with 3 refills. I have sent a signed order to draw a BMET in 1 week on Thursday 10/5 to [place] at [facility] @ [phone]. The pt is aware that Mliss Sax will be drawing her BMET next Thursday at the [facility] facility.

## 2016-02-26 ENCOUNTER — Encounter: Payer: Self-pay | Admitting: Internal Medicine

## 2016-02-26 DIAGNOSIS — I10 Essential (primary) hypertension: Secondary | ICD-10-CM | POA: Diagnosis not present

## 2016-02-26 DIAGNOSIS — I2510 Atherosclerotic heart disease of native coronary artery without angina pectoris: Secondary | ICD-10-CM | POA: Diagnosis not present

## 2016-03-04 ENCOUNTER — Encounter: Payer: Self-pay | Admitting: Interventional Cardiology

## 2016-03-04 DIAGNOSIS — C50919 Malignant neoplasm of unspecified site of unspecified female breast: Secondary | ICD-10-CM | POA: Diagnosis not present

## 2016-03-04 DIAGNOSIS — M859 Disorder of bone density and structure, unspecified: Secondary | ICD-10-CM | POA: Diagnosis not present

## 2016-03-04 DIAGNOSIS — E782 Mixed hyperlipidemia: Secondary | ICD-10-CM | POA: Diagnosis not present

## 2016-03-04 DIAGNOSIS — Z79899 Other long term (current) drug therapy: Secondary | ICD-10-CM | POA: Diagnosis not present

## 2016-03-04 DIAGNOSIS — I2510 Atherosclerotic heart disease of native coronary artery without angina pectoris: Secondary | ICD-10-CM | POA: Diagnosis not present

## 2016-03-04 DIAGNOSIS — Z1389 Encounter for screening for other disorder: Secondary | ICD-10-CM | POA: Diagnosis not present

## 2016-03-04 DIAGNOSIS — R600 Localized edema: Secondary | ICD-10-CM | POA: Diagnosis not present

## 2016-03-04 DIAGNOSIS — Z23 Encounter for immunization: Secondary | ICD-10-CM | POA: Diagnosis not present

## 2016-03-04 DIAGNOSIS — G4733 Obstructive sleep apnea (adult) (pediatric): Secondary | ICD-10-CM | POA: Diagnosis not present

## 2016-03-04 DIAGNOSIS — I10 Essential (primary) hypertension: Secondary | ICD-10-CM | POA: Diagnosis not present

## 2016-03-04 DIAGNOSIS — Z0001 Encounter for general adult medical examination with abnormal findings: Secondary | ICD-10-CM | POA: Diagnosis not present

## 2016-03-04 DIAGNOSIS — I252 Old myocardial infarction: Secondary | ICD-10-CM | POA: Diagnosis not present

## 2016-03-04 DIAGNOSIS — F322 Major depressive disorder, single episode, severe without psychotic features: Secondary | ICD-10-CM | POA: Diagnosis not present

## 2016-03-04 NOTE — Progress Notes (Signed)
Patient ID: [name], female   DOB: [birth year], 82 y.o.   MRN: [id number]     Cardiology Office Note   Date:  03/05/2016   ID:  [name], DOB Oct 05, 1928, MRN [id number]  PCP:  Henrine Screws, MD    No chief complaint on file. CAD   Wt Readings from Last 3 Encounters:  03/05/16 213 lb 3.2 oz (96.7 kg)  10/22/15 210 lb 6.4 oz (95.4 kg)  03/27/15 207 lb 12.8 oz (94.3 kg)       History of Present Illness: [name] is a 82 y.o. female who had an inferior MI in 2008. She has been doing well. Mild exercise in the pool. A BMS to the RCA at that time.  CAD/ASCVD:  She is using CPAP. Now lives in [place].  CPAP is not working well for the past month. Denies : Chest pain. Dizziness.  Fatigue. Leg edema.  Nitroglycerin. Orthopnea. Palpitations. Paroxysmal nocturnal dyspnea.  Syncope.    She noted some DOE with walking.  She uses a walker when she walks.   No problems when she works out at the fitness center.  Frequency of exercise is decreasing.   Her husband has been affected by a couple of strokes.  It has been stressful for her caring for him.  He now has dementia.   She had a pacer placed by Dr. Lovena Le in 2015.  She has some leg swelling.  She is taking low dose Lasix on some days when she stays at home.  She stopped her statin and Zetia over a year ago due to calf pain and cramping.    Past Medical History:  Diagnosis Date  . Breast cancer ([place])   . Colon polyps   . Coronary artery disease 06/2006   STEMI inferior with BMS to mid RCA with mid basal inferior wall HK and luminal irrgularities elsewhere  . Fracture of femoral neck, right ([place]) 06/13/2011  . Fracture of humeral head, right, closed 06/13/2011  . GERD (gastroesophageal reflux disease)   . Hemorrhoids   . Hyperlipemia   . Hypertension   . LBBB (left bundle branch block)   . Macular degeneration   . Myocardial infarction   . Obstructive sleep apnea on CPAP   . Osteopenia   .  Osteopenia   . Pacemaker 02/25/14  . Rhinitis   . Rotator cuff syndrome   . SAH (subarachnoid hemorrhage) ([place]) 02/22/14  . 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 Ulnar neuropathy   . Umbilical hernia   . Unstable gait     Past Surgical History:  Procedure Laterality Date  . BREAST LUMPECTOMY    . CORONARY ANGIOPLASTY WITH STENT PLACEMENT  06/2006   Mid RCA BMS  . HIP ARTHROPLASTY  06/06/2011   Procedure: ARTHROPLASTY BIPOLAR HIP;  Surgeon: Gearlean Alf;  [facility]: [facility];  Service: Orthopedics;  Laterality: Right;  . PERMANENT PACEMAKER INSERTION N/A 02/25/2014   Procedure: PERMANENT PACEMAKER INSERTION;  Surgeon: Evans Lance, MD;  Location: [facility] CATH LAB;  Service: Cardiovascular;  Laterality: N/A;  . SHOULDER SURGERY    . WRIST SURGERY       Current Outpatient Prescriptions  Medication Sig Dispense Refill  . aspirin EC 81 MG tablet Take 1 tablet (81 mg total) by mouth daily. 90 tablet 3  . BYSTOLIC 5 MG tablet TAKE 1 TABLET ONCE DAILY. 30 tablet 9  . Cholecalciferol (VITAMIN D3) 2000 UNITS TABS Take 1 tablet by mouth daily.    Marland Kitchen  Coenzyme Q10 (COQ10) 200 MG CAPS Take 1 tablet by mouth daily.    . furosemide (LASIX) 20 MG tablet Take 20 mg by mouth daily.    Marland Kitchen lisinopril (PRINIVIL,ZESTRIL) 40 MG tablet Take 1 tablet (40 mg total) by mouth daily. 30 tablet 3  . Multiple Vitamins-Minerals (PRESERVISION/LUTEIN) CAPS Take 1 capsule by mouth 2 (two) times daily.    . nitroGLYCERIN (NITROSTAT) 0.4 MG SL tablet Place 0.4 mg under the tongue every 5 (five) minutes as needed for chest pain.    Marland Kitchen Potassium (POTASSIMIN PO) Take 1 tablet by mouth daily.    Marland Kitchen triamcinolone cream (KENALOG) 0.1 % Apply 1 application topically as directed.     No current facility-administered medications for this visit.     Allergies:   Chlorpheniramine and Codeine    Social History:  The patient  reports that she has never smoked. She has never used smokeless tobacco. She reports that she drinks alcohol.    Family History:  The patient's family history includes CVA in her sister; Heart attack in her father.    ROS:  Please see the history of present illness.   Otherwise, review of systems are positive for mild DOE.   All other systems are reviewed and negative.    PHYSICAL EXAM: VS:  BP 140/80   Pulse 70   Ht 5\' 5"  (1.651 m)   Wt 213 lb 3.2 oz (96.7 kg)   BMI 35.48 kg/m  , BMI Body mass index is 35.48 kg/m. GEN: Well nourished, well developed, in no acute distress  HEENT: normal  Neck: no JVD, carotid bruits, or masses Cardiac: RRR; no murmurs, rubs, or gallops,mild bilateral ankle edema  Respiratory:  clear to auscultation bilaterally, normal work of breathing GI: soft, nontender, nondistended, + BS MS: no deformity or atrophy  Skin: warm and dry, no rash Neuro:  Strength and sensation are intact Psych: euthymic mood, full affect   EKG:   The ekg ordered today demonstrates atrial pacing, LBBB,    Recent Labs: No results found for requested labs within last 8760 hours.   Lipid Panel No results found for: CHOL, TRIG, HDL, CHOLHDL, VLDL, LDLCALC, LDLDIRECT   Other studies Reviewed: Additional studies/ records that were reviewed today with results demonstrating: 2011 echo: Normla LV function.  Mild vlavular regurgitation with no indication for SBRE prophylaxis.  .   ASSESSMENT AND PLAN:  1. CAD/Old MI: No chest pain like her MI.  No evidence of fluid overload.  She is off the clopidogrel.  She is having dental work, next week.  According to our last echo in 2011, she does not not need SBE prophylaxis.  Hold off on clopidogrel.  COntinue aspirin for now.     2.   Hyperlipidemia: Off of Crestor Chauncey Cruel .  Intolerant. HTN well controlled at home.  3.  She has some calcium deposit in her breasts as well.  THis will be followed in March 2017.  4. SHOB:  Likely some deconditioning.  She needs to increase exercise.  5.  Edema:  Elevate legs,.  She has a prn diuretic per PMD  but has not used this daily due to inconvenience.   Venous insuffiency.  Increase LAsix to 40 mg daily on the days of the week that she takes her furosemide.  Increased BNP to 450 range.  Likely increased fluid which should be helped by increased Lasix.  Take potassium and eat more potassium in her diet. Check BMet in 2 weeks at [facility].  Current medicines are reviewed at length with the patient today.  The patient concerns regarding her medicines were addressed.  The following changes have been made:  No change  Labs/ tests ordered today include:  No orders of the defined types were placed in this encounter.   Recommend 150 minutes/week of aerobic exercise Low fat, low carb, high fiber diet recommended  Disposition:   FU in 1 year   Signed, Larae Grooms, MD  03/05/2016 4:15 PM    [facility] [address], [place], [place]  [address] Phone: [phone]; Fax: [phone]

## 2016-03-05 ENCOUNTER — Ambulatory Visit (INDEPENDENT_AMBULATORY_CARE_PROVIDER_SITE_OTHER): Payer: Medicare Other | Admitting: Interventional Cardiology

## 2016-03-05 ENCOUNTER — Encounter: Payer: Self-pay | Admitting: Interventional Cardiology

## 2016-03-05 VITALS — BP 140/80 | HR 70 | Ht 65.0 in | Wt 213.2 lb

## 2016-03-05 DIAGNOSIS — R600 Localized edema: Secondary | ICD-10-CM

## 2016-03-05 DIAGNOSIS — I5032 Chronic diastolic (congestive) heart failure: Secondary | ICD-10-CM

## 2016-03-05 DIAGNOSIS — I252 Old myocardial infarction: Secondary | ICD-10-CM | POA: Diagnosis not present

## 2016-03-05 DIAGNOSIS — I10 Essential (primary) hypertension: Secondary | ICD-10-CM | POA: Diagnosis not present

## 2016-03-05 DIAGNOSIS — E7800 Pure hypercholesterolemia, unspecified: Secondary | ICD-10-CM

## 2016-03-05 DIAGNOSIS — I2510 Atherosclerotic heart disease of native coronary artery without angina pectoris: Secondary | ICD-10-CM | POA: Diagnosis not present

## 2016-03-05 MED ORDER — FUROSEMIDE 40 MG PO TABS: 40.0000 mg | ORAL_TABLET | Freq: Every day | ORAL | 3 refills | Status: DC

## 2016-03-05 NOTE — Patient Instructions (Signed)
**Note De-Identified  Obfuscation** Medication Instructions:  Increase Furosemide to 40 mg daily. All other medications remain the same.  Labwork: BMET in 2 weeks at [facility]  Testing/Procedures: None  Follow-Up: Your physician wants you to follow-up in: 1 year. You will receive a reminder letter in the mail two months in advance. If you don't receive a letter, please call our office to schedule the follow-up appointment.     If you need a refill on your cardiac medications before your next appointment, please call your pharmacy.

## 2016-03-09 DIAGNOSIS — R600 Localized edema: Secondary | ICD-10-CM | POA: Insufficient documentation

## 2016-03-29 ENCOUNTER — Ambulatory Visit: Payer: Medicare Other | Admitting: Interventional Cardiology

## 2016-04-02 ENCOUNTER — Ambulatory Visit: Payer: Medicare Other | Admitting: Cardiology

## 2016-04-21 ENCOUNTER — Telehealth: Payer: Self-pay | Admitting: Cardiology

## 2016-04-21 ENCOUNTER — Ambulatory Visit (INDEPENDENT_AMBULATORY_CARE_PROVIDER_SITE_OTHER): Payer: Medicare Other | Admitting: *Deleted

## 2016-04-21 DIAGNOSIS — R55 Syncope and collapse: Secondary | ICD-10-CM

## 2016-04-21 NOTE — Telephone Encounter (Signed)
Spoke with pt and reminded pt of remote transmission that is due today. Pt verbalized understanding.   

## 2016-04-22 NOTE — Progress Notes (Signed)
Remote pacemaker transmission.   

## 2016-04-30 ENCOUNTER — Encounter: Payer: Self-pay | Admitting: Cardiology

## 2016-05-07 ENCOUNTER — Other Ambulatory Visit: Payer: Self-pay | Admitting: Internal Medicine

## 2016-05-31 DIAGNOSIS — M545 Low back pain: Secondary | ICD-10-CM | POA: Diagnosis not present

## 2016-06-03 LAB — CUP PACEART REMOTE DEVICE CHECK
Battery Remaining Percentage: 95.5 %
Brady Statistic AP VP Percent: 20 %
Brady Statistic AP VS Percent: 43 %
Brady Statistic RV Percent Paced: 27 %
Date Time Interrogation Session: 20171129211828
Implantable Lead Implant Date: 20151005
Implantable Lead Location: 753859
Lead Channel Impedance Value: 680 Ohm
Lead Channel Pacing Threshold Amplitude: 0.625 V
Lead Channel Sensing Intrinsic Amplitude: 12 mV
Lead Channel Sensing Intrinsic Amplitude: 5 mV
Lead Channel Setting Pacing Amplitude: 1.875
Lead Channel Setting Pacing Pulse Width: 0.5 ms
Lead Channel Setting Sensing Sensitivity: 4 mV
MDC IDC LEAD IMPLANT DT: 20151005
MDC IDC LEAD LOCATION: 753860
MDC IDC MSMT BATTERY REMAINING LONGEVITY: 126 mo
MDC IDC MSMT BATTERY VOLTAGE: 3.01 V
MDC IDC MSMT LEADCHNL RA IMPEDANCE VALUE: 490 Ohm
MDC IDC MSMT LEADCHNL RA PACING THRESHOLD AMPLITUDE: 0.875 V
MDC IDC MSMT LEADCHNL RA PACING THRESHOLD PULSEWIDTH: 0.5 ms
MDC IDC MSMT LEADCHNL RV PACING THRESHOLD PULSEWIDTH: 0.5 ms
MDC IDC PG IMPLANT DT: 20151005
MDC IDC SET LEADCHNL RV PACING AMPLITUDE: 0.875
MDC IDC STAT BRADY AS VP PERCENT: 7.1 %
MDC IDC STAT BRADY AS VS PERCENT: 24 %
MDC IDC STAT BRADY RA PERCENT PACED: 56 %
Pulse Gen Model: 2240
Pulse Gen Serial Number: 7666288

## 2016-06-24 ENCOUNTER — Other Ambulatory Visit: Payer: Self-pay | Admitting: Interventional Cardiology

## 2016-07-21 ENCOUNTER — Telehealth: Payer: Self-pay | Admitting: Cardiology

## 2016-07-21 ENCOUNTER — Encounter: Payer: Medicare Other | Admitting: *Deleted

## 2016-07-21 DIAGNOSIS — I2510 Atherosclerotic heart disease of native coronary artery without angina pectoris: Secondary | ICD-10-CM | POA: Diagnosis not present

## 2016-07-21 DIAGNOSIS — R600 Localized edema: Secondary | ICD-10-CM | POA: Diagnosis not present

## 2016-07-21 DIAGNOSIS — I252 Old myocardial infarction: Secondary | ICD-10-CM | POA: Diagnosis not present

## 2016-07-21 DIAGNOSIS — I872 Venous insufficiency (chronic) (peripheral): Secondary | ICD-10-CM | POA: Diagnosis not present

## 2016-07-21 NOTE — Telephone Encounter (Signed)
LMOVM reminding pt to send remote transmission.   

## 2016-07-23 ENCOUNTER — Telehealth: Payer: Self-pay | Admitting: Internal Medicine

## 2016-07-23 NOTE — Telephone Encounter (Signed)
Follow Up   Call her Monday if the Device did not transmit, She is stepping out of the house for awhile.

## 2016-07-23 NOTE — Telephone Encounter (Signed)
New Message  Pt call requesting to speak with RN about her device check. Please call back to discuss

## 2016-07-26 NOTE — Telephone Encounter (Signed)
LMOVM for pt to return call 

## 2016-07-26 NOTE — Telephone Encounter (Signed)
Follow up    Pt is returning call to [facility], she states she will not be home this evening and would like to be called back tomorrow morning, she asked for around 830.

## 2016-07-27 ENCOUNTER — Ambulatory Visit (INDEPENDENT_AMBULATORY_CARE_PROVIDER_SITE_OTHER): Payer: Medicare Other | Admitting: *Deleted

## 2016-07-27 DIAGNOSIS — R55 Syncope and collapse: Secondary | ICD-10-CM

## 2016-07-27 DIAGNOSIS — I5032 Chronic diastolic (congestive) heart failure: Secondary | ICD-10-CM

## 2016-07-27 NOTE — Telephone Encounter (Signed)
Spoke w/ pt and instructed her how to send a remote transmission w/ her home monitor. Transmission received. Pt aware.

## 2016-07-27 NOTE — Progress Notes (Signed)
Remote pacemaker transmission.   

## 2016-07-28 ENCOUNTER — Encounter: Payer: Self-pay | Admitting: Cardiology

## 2016-07-28 LAB — CUP PACEART REMOTE DEVICE CHECK
Battery Remaining Percentage: 95.5 %
Battery Voltage: 2.99 V
Brady Statistic AP VP Percent: 20 %
Brady Statistic AS VS Percent: 25 %
Brady Statistic RA Percent Paced: 53 %
Date Time Interrogation Session: 20180306160730
Implantable Lead Implant Date: 20151005
Implantable Lead Implant Date: 20151005
Implantable Lead Location: 753860
Implantable Pulse Generator Implant Date: 20151005
Lead Channel Impedance Value: 680 Ohm
Lead Channel Pacing Threshold Amplitude: 1 V
Lead Channel Pacing Threshold Pulse Width: 0.5 ms
Lead Channel Sensing Intrinsic Amplitude: 2.2 mV
Lead Channel Setting Sensing Sensitivity: 4 mV
MDC IDC LEAD LOCATION: 753859
MDC IDC MSMT BATTERY REMAINING LONGEVITY: 125 mo
MDC IDC MSMT LEADCHNL RA IMPEDANCE VALUE: 460 Ohm
MDC IDC MSMT LEADCHNL RV PACING THRESHOLD AMPLITUDE: 0.75 V
MDC IDC MSMT LEADCHNL RV PACING THRESHOLD PULSEWIDTH: 0.5 ms
MDC IDC MSMT LEADCHNL RV SENSING INTR AMPL: 12 mV
MDC IDC PG SERIAL: 7666288
MDC IDC SET LEADCHNL RA PACING AMPLITUDE: 2 V
MDC IDC SET LEADCHNL RV PACING AMPLITUDE: 1 V
MDC IDC SET LEADCHNL RV PACING PULSEWIDTH: 0.5 ms
MDC IDC STAT BRADY AP VS PERCENT: 41 %
MDC IDC STAT BRADY AS VP PERCENT: 8.3 %
MDC IDC STAT BRADY RV PERCENT PACED: 28 %

## 2016-08-12 DIAGNOSIS — H43813 Vitreous degeneration, bilateral: Secondary | ICD-10-CM | POA: Diagnosis not present

## 2016-08-12 DIAGNOSIS — H52203 Unspecified astigmatism, bilateral: Secondary | ICD-10-CM | POA: Diagnosis not present

## 2016-08-12 DIAGNOSIS — H353132 Nonexudative age-related macular degeneration, bilateral, intermediate dry stage: Secondary | ICD-10-CM | POA: Diagnosis not present

## 2016-08-12 DIAGNOSIS — H40013 Open angle with borderline findings, low risk, bilateral: Secondary | ICD-10-CM | POA: Diagnosis not present

## 2016-08-20 ENCOUNTER — Other Ambulatory Visit: Payer: Self-pay | Admitting: Interventional Cardiology

## 2016-08-23 ENCOUNTER — Other Ambulatory Visit: Payer: Self-pay | Admitting: *Deleted

## 2016-08-23 MED ORDER — NITROGLYCERIN 0.4 MG SL SUBL: 0.4000 mg | SUBLINGUAL_TABLET | SUBLINGUAL | 3 refills | Status: DC | PRN

## 2016-09-27 DIAGNOSIS — F322 Major depressive disorder, single episode, severe without psychotic features: Secondary | ICD-10-CM | POA: Diagnosis not present

## 2016-09-27 DIAGNOSIS — C50919 Malignant neoplasm of unspecified site of unspecified female breast: Secondary | ICD-10-CM | POA: Diagnosis not present

## 2016-09-27 DIAGNOSIS — I252 Old myocardial infarction: Secondary | ICD-10-CM | POA: Diagnosis not present

## 2016-09-27 DIAGNOSIS — R600 Localized edema: Secondary | ICD-10-CM | POA: Diagnosis not present

## 2016-09-27 DIAGNOSIS — E782 Mixed hyperlipidemia: Secondary | ICD-10-CM | POA: Diagnosis not present

## 2016-09-27 DIAGNOSIS — I872 Venous insufficiency (chronic) (peripheral): Secondary | ICD-10-CM | POA: Diagnosis not present

## 2016-09-27 DIAGNOSIS — M859 Disorder of bone density and structure, unspecified: Secondary | ICD-10-CM | POA: Diagnosis not present

## 2016-09-27 DIAGNOSIS — E669 Obesity, unspecified: Secondary | ICD-10-CM | POA: Diagnosis not present

## 2016-09-27 DIAGNOSIS — Z6835 Body mass index (BMI) 35.0-35.9, adult: Secondary | ICD-10-CM | POA: Diagnosis not present

## 2016-09-27 DIAGNOSIS — I10 Essential (primary) hypertension: Secondary | ICD-10-CM | POA: Diagnosis not present

## 2016-09-27 DIAGNOSIS — I2510 Atherosclerotic heart disease of native coronary artery without angina pectoris: Secondary | ICD-10-CM | POA: Diagnosis not present

## 2016-09-27 DIAGNOSIS — G4733 Obstructive sleep apnea (adult) (pediatric): Secondary | ICD-10-CM | POA: Diagnosis not present

## 2016-10-02 ENCOUNTER — Other Ambulatory Visit: Payer: Self-pay | Admitting: Internal Medicine

## 2016-10-20 ENCOUNTER — Ambulatory Visit (INDEPENDENT_AMBULATORY_CARE_PROVIDER_SITE_OTHER): Payer: Medicare Other | Admitting: Internal Medicine

## 2016-10-20 ENCOUNTER — Encounter: Payer: Self-pay | Admitting: Internal Medicine

## 2016-10-20 VITALS — BP 110/60 | HR 61 | Ht 62.0 in | Wt 210.0 lb

## 2016-10-20 DIAGNOSIS — I4430 Unspecified atrioventricular block: Secondary | ICD-10-CM

## 2016-10-20 DIAGNOSIS — Z950 Presence of cardiac pacemaker: Secondary | ICD-10-CM

## 2016-10-20 DIAGNOSIS — I5032 Chronic diastolic (congestive) heart failure: Secondary | ICD-10-CM | POA: Diagnosis not present

## 2016-10-20 NOTE — Progress Notes (Signed)
HPI [name] returns today for followup of her PPM. She is a pleasant 83 yo woman with symptomatic bradycardia and 2:1 AV block and LBBB who underwent PPM insertion approximately 2.5 years ago. In the interim, she has had no recurrent syncope. She denies chest pain or sob. She has been bothered by peripheral edema. No other complaints today. She is now on lasix as needed. She has many questions. She also c/o hair loss. Allergies  Allergen Reactions  . Chlorpheniramine Anaphylaxis  . Codeine Anaphylaxis     Current Outpatient Prescriptions  Medication Sig Dispense Refill  . aspirin EC 81 MG tablet Take 1 tablet (81 mg total) by mouth daily. 90 tablet 3  . BYSTOLIC 5 MG tablet TAKE 1 TABLET ONCE DAILY. 30 tablet 4  . Cholecalciferol (VITAMIN D3) 2000 UNITS TABS Take 1 tablet by mouth daily.    . Coenzyme Q10 (COQ10) 200 MG CAPS Take 1 tablet by mouth daily.    . furosemide (LASIX) 40 MG tablet Take 1 tablet (40 mg total) by mouth daily. 90 tablet 3  . lisinopril (PRINIVIL,ZESTRIL) 40 MG tablet Take 1 tablet (40 mg total) by mouth daily. for blood pressure 30 tablet 7  . Multiple Vitamins-Minerals (PRESERVISION/LUTEIN) CAPS Take 1 capsule by mouth 2 (two) times daily.    . nitroGLYCERIN (NITROSTAT) 0.4 MG SL tablet Place 1 tablet (0.4 mg total) under the tongue every 5 (five) minutes as needed for chest pain. 25 tablet 3  . Potassium (POTASSIMIN PO) Take 1 tablet by mouth daily.    Marland Kitchen triamcinolone cream (KENALOG) 0.1 % Apply 1 application topically as directed.     No current facility-administered medications for this visit.      Past Medical History:  Diagnosis Date  . Breast cancer ([place])   . Colon polyps   . Coronary artery disease 06/2006   STEMI inferior with BMS to mid RCA with mid basal inferior wall HK and luminal irrgularities elsewhere  . Fracture of femoral neck, right ([place]) 06/13/2011  . Fracture of humeral head, right, closed 06/13/2011  . GERD (gastroesophageal  reflux disease)   . Hemorrhoids   . Hyperlipemia   . Hypertension   . LBBB (left bundle branch block)   . Macular degeneration   . Myocardial infarction   . Obstructive sleep apnea on CPAP   . Osteopenia   . Osteopenia   . Pacemaker 02/25/14  . Rhinitis   . Rotator cuff syndrome   . SAH (subarachnoid hemorrhage) ([place]) 02/22/14  . 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 Ulnar neuropathy   . Umbilical hernia   . Unstable gait     ROS:   All systems reviewed and negative except as noted in the HPI.   Past Surgical History:  Procedure Laterality Date  . BREAST LUMPECTOMY    . CORONARY ANGIOPLASTY WITH STENT PLACEMENT  06/2006   Mid RCA BMS  . HIP ARTHROPLASTY  06/06/2011   Procedure: ARTHROPLASTY BIPOLAR HIP;  Surgeon: Gearlean Alf;  [facility]: [facility];  Service: Orthopedics;  Laterality: Right;  . PERMANENT PACEMAKER INSERTION N/A 02/25/2014   Procedure: PERMANENT PACEMAKER INSERTION;  Surgeon: Evans Lance, MD;  Location: [facility] CATH LAB;  Service: Cardiovascular;  Laterality: N/A;  . SHOULDER SURGERY    . WRIST SURGERY       Family History  Problem Relation Age of Onset  . Heart attack Father   . CVA Sister      Social History  Social History  . Marital status: Married    Spouse name: N/A  . Number of children: N/A  . Years of education: N/A   Occupational History  . Not on file.   Social History Main Topics  . Smoking status: Never Smoker  . Smokeless tobacco: Never Used  . Alcohol use 0.0 oz/week     Comment: occasional wine  . Drug use: Unknown  . Sexual activity: Not on file   Other Topics Concern  . Not on file   Social History Narrative  . No narrative on file     BP 110/60   Pulse 61   Ht 5\' 2"  (1.575 m)   Wt 210 lb (95.3 kg)   SpO2 98%   BMI 38.41 kg/m   Physical Exam:  Well appearing 83 yo woman, NAD, alopecia. HEENT: Unremarkable Neck:  6 cm JVD, no thyromegally Back:  No CVA tenderness Lungs:  Clear with no wheezes; well healed PPM  incision. HEART:  Regular rate rhythm, no murmurs, no rubs, no clicks Abd:  soft, positive bowel sounds, no organomegally, no rebound, no guarding Ext:  2 plus pulses, 1+ peripheral edema, no cyanosis, no clubbing Skin:  No rashes no nodules Neuro:  CN II through XII intact, motor grossly intact     DEVICE  Normal device function.  See PaceArt for details.   Assess/Plan: 1. 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 Will follow. Today she has CHB. 2. Chronic diastolic heart failure - she is class 2. She will continue her lasix and ACE inhibitor. 3. HTN - her blood pressure is well controlled. No change in meds. I have asked her to break her lisinopril in half and take 20 mg twice daily. 4. PPM - her St. Jude DDD PM is working normally. Will recheck in several months.  [name].D.

## 2016-10-20 NOTE — Patient Instructions (Signed)
Medication Instructions:  Your physician recommends that you continue on your current medications as directed. Please refer to the Current Medication list given to you today.   Labwork: None Ordered   Testing/Procedures: None Ordered   Follow-Up: Your physician wants you to follow-up in: 1 year with Dr. Taylor. You will receive a reminder letter in the mail two months in advance. If you don't receive a letter, please call our office to schedule the follow-up appointment.  Remote monitoring is used to monitor your Pacemaker from home. This monitoring reduces the number of office visits required to check your device to one time per year. It allows us to keep an eye on the functioning of your device to ensure it is working properly. You are scheduled for a device check from home on  01/19/17 . You may send your transmission at any time that day. If you have a wireless device, the transmission will be sent automatically. After your physician reviews your transmission, you will receive a postcard with your next transmission date.    Any Other Special Instructions Will Be Listed Below (If Applicable).     If you need a refill on your cardiac medications before your next appointment, please call your pharmacy.   

## 2016-11-02 ENCOUNTER — Telehealth: Payer: Self-pay | Admitting: Interventional Cardiology

## 2016-11-02 ENCOUNTER — Other Ambulatory Visit: Payer: Self-pay | Admitting: Obstetrics

## 2016-11-02 DIAGNOSIS — R921 Mammographic calcification found on diagnostic imaging of breast: Secondary | ICD-10-CM

## 2016-11-02 MED ORDER — LISINOPRIL 20 MG PO TABS: 40.0000 mg | ORAL_TABLET | Freq: Every day | ORAL | 3 refills | Status: DC

## 2016-11-02 NOTE — Telephone Encounter (Signed)
Pt calling requesting a refill on lisinopril 40 mg tablet, taken daily. Pt wanting to know if Dr. Irish Lack could change her Lisinopril 40 mg tablet taken daily, to Lisinopril 20 mg tablet taking BID, pt states that the pill is smalller and better for her to take. Please advise

## 2016-11-02 NOTE — Telephone Encounter (Signed)
Spoke with patient and she is requesting that her lisinopril Rx be switched to the 20 mg tablets. She states that the 40 mg tablets are harder to swallow. Rx changed and patient advised to take 2 of the 40 mg tablets once daily. Patient advised to continue to monitor BP. Rx sent to patient's preferred pharmacy. Patient verbalized understanding and thanked me for the call.

## 2016-11-03 NOTE — Telephone Encounter (Signed)
Patient calling and is asking if she can take her lisinopril 20 mg BID. Per Dr. Tanna Furry OV note from 5/30 this was okay. Advised patient to continue to monitor BP. Patient states that she is going to have it checked at the [facility] and call us back with readings in a couple of weeks.

## 2016-11-13 LAB — CUP PACEART INCLINIC DEVICE CHECK
Battery Voltage: 2.99 V
Brady Statistic RA Percent Paced: 54 %
Brady Statistic RV Percent Paced: 40 %
Date Time Interrogation Session: 20180530135907
Implantable Lead Implant Date: 20151005
Implantable Lead Location: 753859
Implantable Pulse Generator Implant Date: 20151005
Lead Channel Impedance Value: 475 Ohm
Lead Channel Pacing Threshold Amplitude: 0.875 V
Lead Channel Pacing Threshold Pulse Width: 0.5 ms
Lead Channel Setting Pacing Amplitude: 0.875
Lead Channel Setting Pacing Pulse Width: 0.5 ms
MDC IDC LEAD IMPLANT DT: 20151005
MDC IDC LEAD LOCATION: 753860
MDC IDC MSMT LEADCHNL RA PACING THRESHOLD PULSEWIDTH: 0.5 ms
MDC IDC MSMT LEADCHNL RA SENSING INTR AMPL: 4 mV
MDC IDC MSMT LEADCHNL RV IMPEDANCE VALUE: 712.5 Ohm
MDC IDC MSMT LEADCHNL RV PACING THRESHOLD AMPLITUDE: 0.625 V
MDC IDC SET LEADCHNL RA PACING AMPLITUDE: 1.875
MDC IDC SET LEADCHNL RV SENSING SENSITIVITY: 4 mV
Pulse Gen Model: 2240
Pulse Gen Serial Number: 7666288

## 2016-12-14 ENCOUNTER — Inpatient Hospital Stay
Admission: RE | Admit: 2016-12-14 | Discharge: 2016-12-14 | Disposition: A | Discharge status: Home / Self Care | Payer: Medicare Other | Source: Ambulatory Visit | Attending: Obstetrics | Admitting: Obstetrics

## 2017-01-13 DIAGNOSIS — I872 Venous insufficiency (chronic) (peripheral): Secondary | ICD-10-CM | POA: Diagnosis not present

## 2017-01-13 DIAGNOSIS — D1801 Hemangioma of skin and subcutaneous tissue: Secondary | ICD-10-CM | POA: Diagnosis not present

## 2017-01-13 DIAGNOSIS — L821 Other seborrheic keratosis: Secondary | ICD-10-CM | POA: Diagnosis not present

## 2017-01-13 DIAGNOSIS — L814 Other melanin hyperpigmentation: Secondary | ICD-10-CM | POA: Diagnosis not present

## 2017-01-13 DIAGNOSIS — I8311 Varicose veins of right lower extremity with inflammation: Secondary | ICD-10-CM | POA: Diagnosis not present

## 2017-01-13 DIAGNOSIS — L659 Nonscarring hair loss, unspecified: Secondary | ICD-10-CM | POA: Diagnosis not present

## 2017-01-13 DIAGNOSIS — I8312 Varicose veins of left lower extremity with inflammation: Secondary | ICD-10-CM | POA: Diagnosis not present

## 2017-01-19 ENCOUNTER — Encounter: Payer: Medicare Other | Admitting: *Deleted

## 2017-01-21 ENCOUNTER — Encounter: Payer: Self-pay | Admitting: Cardiology

## 2017-01-31 ENCOUNTER — Telehealth: Payer: Self-pay | Admitting: Internal Medicine

## 2017-01-31 NOTE — Telephone Encounter (Signed)
New message    Pt received letter about not receiving transmission. She is sending a transmission this morning.

## 2017-01-31 NOTE — Telephone Encounter (Signed)
Instructed [name] how to send transmission. She is appreciative and understands.

## 2017-01-31 NOTE — Telephone Encounter (Signed)
[name] calling back- lights blinking at the same time. Instructed her to call tech support for assistance.

## 2017-02-16 DIAGNOSIS — H43813 Vitreous degeneration, bilateral: Secondary | ICD-10-CM | POA: Diagnosis not present

## 2017-02-16 DIAGNOSIS — H353132 Nonexudative age-related macular degeneration, bilateral, intermediate dry stage: Secondary | ICD-10-CM | POA: Diagnosis not present

## 2017-02-17 ENCOUNTER — Ambulatory Visit
Admission: RE | Admit: 2017-02-17 | Discharge: 2017-02-17 | Disposition: A | Discharge status: Home / Self Care | Payer: Medicare Other | Source: Ambulatory Visit | Attending: Obstetrics | Admitting: Obstetrics

## 2017-02-17 ENCOUNTER — Other Ambulatory Visit: Payer: Self-pay | Admitting: Obstetrics

## 2017-02-17 DIAGNOSIS — R921 Mammographic calcification found on diagnostic imaging of breast: Secondary | ICD-10-CM | POA: Diagnosis not present

## 2017-02-17 DIAGNOSIS — N6310 Unspecified lump in the right breast, unspecified quadrant: Secondary | ICD-10-CM

## 2017-02-17 DIAGNOSIS — N6311 Unspecified lump in the right breast, upper outer quadrant: Secondary | ICD-10-CM | POA: Diagnosis not present

## 2017-02-17 DIAGNOSIS — N6312 Unspecified lump in the right breast, upper inner quadrant: Secondary | ICD-10-CM | POA: Diagnosis not present

## 2017-02-18 DIAGNOSIS — Z23 Encounter for immunization: Secondary | ICD-10-CM | POA: Diagnosis not present

## 2017-03-06 DIAGNOSIS — R41840 Attention and concentration deficit: Secondary | ICD-10-CM | POA: Diagnosis not present

## 2017-03-06 DIAGNOSIS — R4189 Other symptoms and signs involving cognitive functions and awareness: Secondary | ICD-10-CM | POA: Diagnosis not present

## 2017-03-06 DIAGNOSIS — M6259 Muscle wasting and atrophy, not elsewhere classified, multiple sites: Secondary | ICD-10-CM | POA: Diagnosis not present

## 2017-03-08 DIAGNOSIS — M6259 Muscle wasting and atrophy, not elsewhere classified, multiple sites: Secondary | ICD-10-CM | POA: Diagnosis not present

## 2017-03-08 DIAGNOSIS — R41840 Attention and concentration deficit: Secondary | ICD-10-CM | POA: Diagnosis not present

## 2017-03-08 DIAGNOSIS — R4189 Other symptoms and signs involving cognitive functions and awareness: Secondary | ICD-10-CM | POA: Diagnosis not present

## 2017-03-09 ENCOUNTER — Ambulatory Visit (INDEPENDENT_AMBULATORY_CARE_PROVIDER_SITE_OTHER): Payer: Medicare Other | Admitting: Interventional Cardiology

## 2017-03-09 ENCOUNTER — Encounter: Payer: Self-pay | Admitting: Interventional Cardiology

## 2017-03-09 VITALS — BP 110/70 | HR 77 | Ht 62.0 in | Wt 208.8 lb

## 2017-03-09 DIAGNOSIS — I2510 Atherosclerotic heart disease of native coronary artery without angina pectoris: Secondary | ICD-10-CM | POA: Diagnosis not present

## 2017-03-09 DIAGNOSIS — Z950 Presence of cardiac pacemaker: Secondary | ICD-10-CM

## 2017-03-09 DIAGNOSIS — I252 Old myocardial infarction: Secondary | ICD-10-CM

## 2017-03-09 DIAGNOSIS — I10 Essential (primary) hypertension: Secondary | ICD-10-CM

## 2017-03-09 DIAGNOSIS — E782 Mixed hyperlipidemia: Secondary | ICD-10-CM

## 2017-03-09 NOTE — Progress Notes (Signed)
Cardiology Office Note   Date:  03/09/2017   ID:  [name], DOB [birth year], MRN [id number]  PCP:  Josetta Huddle, MD    No chief complaint on file.  CAD, old MI  Wt Readings from Last 3 Encounters:  03/09/17 208 lb 12.8 oz (94.7 kg)  10/20/16 210 lb (95.3 kg)  03/05/16 213 lb 3.2 oz (96.7 kg)       History of Present Illness: [name] is a 83 y.o. female  who had an inferior MI in 2008, treated with BMS to the RCA.  Her husband has been affected by a couple of strokes.  It has been stressful for her caring for him.  He now has dementia. He is now in memory care.  THis is the first time they have ben separated.  She is independent living.   She had a pacer placed by Dr. Lovena Le in 2015.  She has been intolerant of statins.  From a cardiac standpoint, she feels well.  Denies : Chest pain. Dizziness. Leg edema. Nitroglycerin use. Orthopnea. Palpitations. Paroxysmal nocturnal dyspnea. Shortness of breath. Syncope.   She does a little walking with a walker.  She wants to try to be more active.  SH eenjoys the fitness programs they have at [employer]     Past Medical History:  Diagnosis Date  . Breast cancer ([place])   . Colon polyps   . Coronary artery disease 06/2006   STEMI inferior with BMS to mid RCA with mid basal inferior wall HK and luminal irrgularities elsewhere  . Fracture of femoral neck, right ([place]) 06/13/2011  . Fracture of humeral head, right, closed 06/13/2011  . GERD (gastroesophageal reflux disease)   . Hemorrhoids   . Hyperlipemia   . Hypertension   . LBBB (left bundle branch block)   . Macular degeneration   . Myocardial infarction ([place])   . Obstructive sleep apnea on CPAP   . Osteopenia   . Osteopenia   . Pacemaker 02/25/14  . Rhinitis   . Rotator cuff syndrome   . SAH (subarachnoid hemorrhage) ([place]) 02/22/14  . 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 Ulnar neuropathy   . Umbilical hernia   . Unstable gait     Past Surgical  History:  Procedure Laterality Date  . BREAST LUMPECTOMY    . CORONARY ANGIOPLASTY WITH STENT PLACEMENT  06/2006   Mid RCA BMS  . HIP ARTHROPLASTY  06/06/2011   Procedure: ARTHROPLASTY BIPOLAR HIP;  Surgeon: Gearlean Alf;  Location: [facility];  Service: Orthopedics;  Laterality: Right;  . PERMANENT PACEMAKER INSERTION N/A 02/25/2014   Procedure: PERMANENT PACEMAKER INSERTION;  Surgeon: Evans Lance, MD;  Location: [facility] CATH LAB;  Service: Cardiovascular;  Laterality: N/A;  . SHOULDER SURGERY    . WRIST SURGERY       Current Outpatient Prescriptions  Medication Sig Dispense Refill  . aspirin EC 81 MG tablet Take 1 tablet (81 mg total) by mouth daily. 90 tablet 3  . BYSTOLIC 5 MG tablet TAKE 1 TABLET ONCE DAILY. 30 tablet 4  . Cholecalciferol (VITAMIN D3) 2000 UNITS TABS Take 1 tablet by mouth daily.    . Coenzyme Q10 (COQ10) 200 MG CAPS Take 1 tablet by mouth daily.    . furosemide (LASIX) 40 MG tablet Take 1 tablet (40 mg total) by mouth daily. (Patient taking differently: Take 40 mg by mouth daily as needed. ) 90 tablet 3  . Multiple Vitamins-Minerals (PRESERVISION/LUTEIN) CAPS Take 1 capsule by  mouth 2 (two) times daily.    . nitroGLYCERIN (NITROSTAT) 0.4 MG SL tablet Place 1 tablet (0.4 mg total) under the tongue every 5 (five) minutes as needed for chest pain. 25 tablet 3  . Potassium (POTASSIMIN PO) Take 1 tablet by mouth daily.    Marland Kitchen triamcinolone cream (KENALOG) 0.1 % Apply 1 application topically as directed.    Marland Kitchen lisinopril (PRINIVIL,ZESTRIL) 20 MG tablet Take 2 tablets (40 mg total) by mouth daily. 180 tablet 3   No current facility-administered medications for this visit.     Allergies:   Chlorpheniramine and Codeine    Social History:  The patient  reports that she has never smoked. She has never used smokeless tobacco. She reports that she drinks alcohol.   Family History:  The patient's \ family history includes CVA in her sister; Heart attack in her father.    ROS:   Please see the history of present illness.   Otherwise, review of systems are positive for stress.   All other systems are reviewed and negative.    PHYSICAL EXAM: VS:  BP 110/70   Pulse 77   Ht 5\' 2"  (1.575 m)   Wt 208 lb 12.8 oz (94.7 kg)   SpO2 97%   BMI 38.19 kg/m  , BMI Body mass index is 38.19 kg/m. GEN: Well nourished, well developed, in no acute distress  HEENT: normal  Neck: no JVD, carotid bruits, or masses Cardiac: RRR; premature beats; no murmurs, rubs, or gallops; bilateral LE edema R>L Respiratory:  clear to auscultation bilaterally, normal work of breathing GI: soft, nontender, nondistended, + BS MS: no deformity or atrophy  Skin: warm and dry, no rash Neuro:  Strength and sensation are intact, slow gait Psych: euthymic mood, full affect   EKG:   The ekg ordered today demonstrates AV pacing, PVCs   Recent Labs: No results found for requested labs within last 8760 hours.   Lipid Panel No results found for: CHOL, TRIG, HDL, CHOLHDL, VLDL, LDLCALC, LDLDIRECT   Other studies Reviewed: Additional studies/ records that were reviewed today with results demonstrating: 2017 ECG reviewed showing LBBB.   ASSESSMENT AND PLAN:  1. CAD/Old MI: No angina.  COntinue aggressive secondary prevention.  2. Hyperlipidemia: LDL 134 in 2017.  Did not tolerate a low dose of Crestor.  Discussed PCSK9 inhibitors but given her age and overall condition, and cost, she is not interested,  I think this is reasonable.   3. Pacer: Follows with Dr. Lovena Le. 4. HTN: SHe states readings have been high at her facility.  Continue current meds. She has some dizziness, but BP have been high.  Continue current meds as BP controlled today.    Current medicines are reviewed at length with the patient today.  The patient concerns regarding her medicines were addressed.  The following changes have been made:  No change  Labs/ tests ordered today include:  No orders of the defined types were  placed in this encounter.   Recommend 150 minutes/week of aerobic exercise Low fat, low carb, high fiber diet recommended  Disposition:   FU in  1 year   Signed, Larae Grooms, MD  03/09/2017 11:41 AM    [facility] [id number], [place], [place]  [address] Phone: [phone]; Fax: [phone]

## 2017-03-09 NOTE — Patient Instructions (Signed)

## 2017-03-14 ENCOUNTER — Other Ambulatory Visit: Payer: Self-pay | Admitting: Internal Medicine

## 2017-03-17 DIAGNOSIS — R41840 Attention and concentration deficit: Secondary | ICD-10-CM | POA: Diagnosis not present

## 2017-03-17 DIAGNOSIS — R4189 Other symptoms and signs involving cognitive functions and awareness: Secondary | ICD-10-CM | POA: Diagnosis not present

## 2017-03-17 DIAGNOSIS — M6259 Muscle wasting and atrophy, not elsewhere classified, multiple sites: Secondary | ICD-10-CM | POA: Diagnosis not present

## 2017-03-20 DIAGNOSIS — R41840 Attention and concentration deficit: Secondary | ICD-10-CM | POA: Diagnosis not present

## 2017-03-20 DIAGNOSIS — R4189 Other symptoms and signs involving cognitive functions and awareness: Secondary | ICD-10-CM | POA: Diagnosis not present

## 2017-03-20 DIAGNOSIS — M6259 Muscle wasting and atrophy, not elsewhere classified, multiple sites: Secondary | ICD-10-CM | POA: Diagnosis not present

## 2017-03-28 DIAGNOSIS — F4323 Adjustment disorder with mixed anxiety and depressed mood: Secondary | ICD-10-CM | POA: Diagnosis not present

## 2017-04-04 DIAGNOSIS — F4323 Adjustment disorder with mixed anxiety and depressed mood: Secondary | ICD-10-CM | POA: Diagnosis not present

## 2017-04-04 DIAGNOSIS — R4189 Other symptoms and signs involving cognitive functions and awareness: Secondary | ICD-10-CM | POA: Diagnosis not present

## 2017-04-04 DIAGNOSIS — R41840 Attention and concentration deficit: Secondary | ICD-10-CM | POA: Diagnosis not present

## 2017-04-04 DIAGNOSIS — M6259 Muscle wasting and atrophy, not elsewhere classified, multiple sites: Secondary | ICD-10-CM | POA: Diagnosis not present

## 2017-04-05 DIAGNOSIS — H903 Sensorineural hearing loss, bilateral: Secondary | ICD-10-CM | POA: Diagnosis not present

## 2017-04-19 DIAGNOSIS — R41840 Attention and concentration deficit: Secondary | ICD-10-CM | POA: Diagnosis not present

## 2017-04-19 DIAGNOSIS — M6259 Muscle wasting and atrophy, not elsewhere classified, multiple sites: Secondary | ICD-10-CM | POA: Diagnosis not present

## 2017-04-19 DIAGNOSIS — R4189 Other symptoms and signs involving cognitive functions and awareness: Secondary | ICD-10-CM | POA: Diagnosis not present

## 2017-04-25 DIAGNOSIS — J209 Acute bronchitis, unspecified: Secondary | ICD-10-CM | POA: Diagnosis not present

## 2017-05-10 DIAGNOSIS — R4189 Other symptoms and signs involving cognitive functions and awareness: Secondary | ICD-10-CM | POA: Diagnosis not present

## 2017-05-10 DIAGNOSIS — M6259 Muscle wasting and atrophy, not elsewhere classified, multiple sites: Secondary | ICD-10-CM | POA: Diagnosis not present

## 2017-05-10 DIAGNOSIS — R41840 Attention and concentration deficit: Secondary | ICD-10-CM | POA: Diagnosis not present

## 2017-05-11 DIAGNOSIS — R41840 Attention and concentration deficit: Secondary | ICD-10-CM | POA: Diagnosis not present

## 2017-05-11 DIAGNOSIS — R4189 Other symptoms and signs involving cognitive functions and awareness: Secondary | ICD-10-CM | POA: Diagnosis not present

## 2017-05-11 DIAGNOSIS — M6259 Muscle wasting and atrophy, not elsewhere classified, multiple sites: Secondary | ICD-10-CM | POA: Diagnosis not present

## 2017-05-12 DIAGNOSIS — R41840 Attention and concentration deficit: Secondary | ICD-10-CM | POA: Diagnosis not present

## 2017-05-12 DIAGNOSIS — R4189 Other symptoms and signs involving cognitive functions and awareness: Secondary | ICD-10-CM | POA: Diagnosis not present

## 2017-05-12 DIAGNOSIS — M6259 Muscle wasting and atrophy, not elsewhere classified, multiple sites: Secondary | ICD-10-CM | POA: Diagnosis not present

## 2017-05-13 DIAGNOSIS — R05 Cough: Secondary | ICD-10-CM | POA: Diagnosis not present

## 2017-05-13 DIAGNOSIS — M6281 Muscle weakness (generalized): Secondary | ICD-10-CM | POA: Diagnosis not present

## 2017-05-13 DIAGNOSIS — R2681 Unsteadiness on feet: Secondary | ICD-10-CM | POA: Diagnosis not present

## 2017-05-13 DIAGNOSIS — R600 Localized edema: Secondary | ICD-10-CM | POA: Diagnosis not present

## 2017-05-25 ENCOUNTER — Telehealth: Payer: Self-pay

## 2017-05-25 DIAGNOSIS — R4189 Other symptoms and signs involving cognitive functions and awareness: Secondary | ICD-10-CM | POA: Diagnosis not present

## 2017-05-25 DIAGNOSIS — M6259 Muscle wasting and atrophy, not elsewhere classified, multiple sites: Secondary | ICD-10-CM | POA: Diagnosis not present

## 2017-05-25 DIAGNOSIS — R41840 Attention and concentration deficit: Secondary | ICD-10-CM | POA: Diagnosis not present

## 2017-05-25 NOTE — Telephone Encounter (Signed)
Fax received with medication update for patient. Dr. Inda Merlin d/c'd Lisinopril and started losartan 50 mg QD. Med list updated.

## 2017-06-02 DIAGNOSIS — R41840 Attention and concentration deficit: Secondary | ICD-10-CM | POA: Diagnosis not present

## 2017-06-02 DIAGNOSIS — M6259 Muscle wasting and atrophy, not elsewhere classified, multiple sites: Secondary | ICD-10-CM | POA: Diagnosis not present

## 2017-06-02 DIAGNOSIS — R4189 Other symptoms and signs involving cognitive functions and awareness: Secondary | ICD-10-CM | POA: Diagnosis not present

## 2017-06-07 DIAGNOSIS — M6259 Muscle wasting and atrophy, not elsewhere classified, multiple sites: Secondary | ICD-10-CM | POA: Diagnosis not present

## 2017-06-07 DIAGNOSIS — R4189 Other symptoms and signs involving cognitive functions and awareness: Secondary | ICD-10-CM | POA: Diagnosis not present

## 2017-06-07 DIAGNOSIS — R41840 Attention and concentration deficit: Secondary | ICD-10-CM | POA: Diagnosis not present

## 2017-06-08 DIAGNOSIS — R4189 Other symptoms and signs involving cognitive functions and awareness: Secondary | ICD-10-CM | POA: Diagnosis not present

## 2017-06-08 DIAGNOSIS — M6259 Muscle wasting and atrophy, not elsewhere classified, multiple sites: Secondary | ICD-10-CM | POA: Diagnosis not present

## 2017-06-08 DIAGNOSIS — R41840 Attention and concentration deficit: Secondary | ICD-10-CM | POA: Diagnosis not present

## 2017-06-09 DIAGNOSIS — Z471 Aftercare following joint replacement surgery: Secondary | ICD-10-CM | POA: Diagnosis not present

## 2017-06-09 DIAGNOSIS — Z96649 Presence of unspecified artificial hip joint: Secondary | ICD-10-CM | POA: Diagnosis not present

## 2017-06-09 DIAGNOSIS — M7061 Trochanteric bursitis, right hip: Secondary | ICD-10-CM | POA: Diagnosis not present

## 2017-06-09 DIAGNOSIS — Z96641 Presence of right artificial hip joint: Secondary | ICD-10-CM | POA: Diagnosis not present

## 2017-06-15 DIAGNOSIS — M6259 Muscle wasting and atrophy, not elsewhere classified, multiple sites: Secondary | ICD-10-CM | POA: Diagnosis not present

## 2017-06-15 DIAGNOSIS — R4189 Other symptoms and signs involving cognitive functions and awareness: Secondary | ICD-10-CM | POA: Diagnosis not present

## 2017-06-15 DIAGNOSIS — R41840 Attention and concentration deficit: Secondary | ICD-10-CM | POA: Diagnosis not present

## 2017-07-01 DIAGNOSIS — R4189 Other symptoms and signs involving cognitive functions and awareness: Secondary | ICD-10-CM | POA: Diagnosis not present

## 2017-07-01 DIAGNOSIS — M6259 Muscle wasting and atrophy, not elsewhere classified, multiple sites: Secondary | ICD-10-CM | POA: Diagnosis not present

## 2017-07-01 DIAGNOSIS — R41840 Attention and concentration deficit: Secondary | ICD-10-CM | POA: Diagnosis not present

## 2017-07-02 DIAGNOSIS — R4189 Other symptoms and signs involving cognitive functions and awareness: Secondary | ICD-10-CM | POA: Diagnosis not present

## 2017-07-02 DIAGNOSIS — M6259 Muscle wasting and atrophy, not elsewhere classified, multiple sites: Secondary | ICD-10-CM | POA: Diagnosis not present

## 2017-07-02 DIAGNOSIS — R41840 Attention and concentration deficit: Secondary | ICD-10-CM | POA: Diagnosis not present

## 2017-07-26 DIAGNOSIS — H6123 Impacted cerumen, bilateral: Secondary | ICD-10-CM | POA: Diagnosis not present

## 2017-07-26 DIAGNOSIS — Z5320 Procedure and treatment not carried out because of patient's decision for unspecified reasons: Secondary | ICD-10-CM | POA: Insufficient documentation

## 2017-07-26 DIAGNOSIS — R600 Localized edema: Secondary | ICD-10-CM | POA: Insufficient documentation

## 2017-07-26 DIAGNOSIS — R413 Other amnesia: Secondary | ICD-10-CM | POA: Diagnosis not present

## 2017-07-26 DIAGNOSIS — I252 Old myocardial infarction: Secondary | ICD-10-CM | POA: Diagnosis not present

## 2017-07-26 DIAGNOSIS — R609 Edema, unspecified: Secondary | ICD-10-CM | POA: Diagnosis not present

## 2017-07-26 DIAGNOSIS — R2681 Unsteadiness on feet: Secondary | ICD-10-CM | POA: Diagnosis not present

## 2017-07-26 DIAGNOSIS — I10 Essential (primary) hypertension: Secondary | ICD-10-CM | POA: Diagnosis not present

## 2017-07-27 ENCOUNTER — Other Ambulatory Visit: Payer: Self-pay | Admitting: Thoracic Surgery

## 2017-07-27 DIAGNOSIS — R413 Other amnesia: Secondary | ICD-10-CM

## 2017-07-27 DIAGNOSIS — I6523 Occlusion and stenosis of bilateral carotid arteries: Secondary | ICD-10-CM

## 2017-07-27 DIAGNOSIS — I509 Heart failure, unspecified: Secondary | ICD-10-CM

## 2017-07-27 LAB — BASIC METABOLIC PANEL
BUN: 13 (ref 4–21)
CREATININE: 0.7 (ref 0.5–1.1)
Glucose: 85
POTASSIUM: 4.5 (ref 3.4–5.3)
SODIUM: 135 — AB (ref 137–147)

## 2017-07-27 LAB — LIPID PANEL
CHOLESTEROL: 214 — AB (ref 0–200)
HDL: 60 (ref 35–70)
LDL Cholesterol: 134
TRIGLYCERIDES: 104 (ref 40–160)

## 2017-07-27 LAB — HEPATIC FUNCTION PANEL
ALT: 9 (ref 7–35)
AST: 17 (ref 13–35)
Alkaline Phosphatase: 106 (ref 25–125)
Bilirubin, Total: 0.6

## 2017-07-27 LAB — TSH
TSH: 2.84 (ref 0.41–5.90)
TSH: 4.05 (ref <=5.90)

## 2017-07-27 LAB — CBC AND DIFFERENTIAL
HCT: 38 (ref 36–46)
HEMOGLOBIN: 12.7 (ref 12.0–16.0)
Platelets: 247 (ref 150–399)
WBC: 8

## 2017-07-27 LAB — VITAMIN D 25 HYDROXY (VIT D DEFICIENCY, FRACTURES): Vit D, 25-Hydroxy: 36.71

## 2017-07-27 LAB — POCT ERYTHROCYTE SEDIMENTATION RATE, NON-AUTOMATED: Sed Rate: 13

## 2017-07-27 LAB — VITAMIN B12: VITAMIN B 12: 331

## 2017-07-28 DIAGNOSIS — R413 Other amnesia: Secondary | ICD-10-CM | POA: Diagnosis not present

## 2017-07-28 DIAGNOSIS — E039 Hypothyroidism, unspecified: Secondary | ICD-10-CM | POA: Diagnosis not present

## 2017-07-28 DIAGNOSIS — I509 Heart failure, unspecified: Secondary | ICD-10-CM | POA: Diagnosis not present

## 2017-07-28 DIAGNOSIS — D649 Anemia, unspecified: Secondary | ICD-10-CM | POA: Diagnosis not present

## 2017-07-28 DIAGNOSIS — D519 Vitamin B12 deficiency anemia, unspecified: Secondary | ICD-10-CM | POA: Diagnosis not present

## 2017-07-28 DIAGNOSIS — M2550 Pain in unspecified joint: Secondary | ICD-10-CM | POA: Diagnosis not present

## 2017-07-28 DIAGNOSIS — E559 Vitamin D deficiency, unspecified: Secondary | ICD-10-CM | POA: Diagnosis not present

## 2017-07-28 DIAGNOSIS — I6523 Occlusion and stenosis of bilateral carotid arteries: Secondary | ICD-10-CM | POA: Diagnosis not present

## 2017-07-28 LAB — VITAMIN B12: Vitamin B-12: 331

## 2017-07-28 LAB — BASIC METABOLIC PANEL
BUN: 14 (ref 4–21)
Creatinine: 0.5 (ref 0.5–1.1)
Glucose: 88
Potassium: 4.2 (ref 3.4–5.3)
Sodium: 136 — AB (ref 137–147)

## 2017-07-28 LAB — CBC AND DIFFERENTIAL
HCT: 39 (ref 36–46)
Hemoglobin: 13 (ref 12.0–16.0)
Platelets: 210 (ref 150–399)
WBC: 6.8

## 2017-07-28 LAB — TSH: TSH: 4.05 (ref 0.41–5.90)

## 2017-07-29 ENCOUNTER — Other Ambulatory Visit: Payer: Self-pay | Admitting: Thoracic Surgery

## 2017-07-29 ENCOUNTER — Ambulatory Visit
Admission: RE | Admit: 2017-07-29 | Discharge: 2017-07-29 | Disposition: A | Discharge status: Home / Self Care | Payer: Medicare Other | Source: Ambulatory Visit | Attending: Thoracic Surgery | Admitting: Thoracic Surgery

## 2017-07-29 DIAGNOSIS — I6523 Occlusion and stenosis of bilateral carotid arteries: Secondary | ICD-10-CM | POA: Diagnosis not present

## 2017-07-29 DIAGNOSIS — R413 Other amnesia: Secondary | ICD-10-CM

## 2017-07-29 DIAGNOSIS — I509 Heart failure, unspecified: Secondary | ICD-10-CM

## 2017-08-01 DIAGNOSIS — F0150 Vascular dementia without behavioral disturbance: Secondary | ICD-10-CM | POA: Diagnosis not present

## 2017-08-01 DIAGNOSIS — R488 Other symbolic dysfunctions: Secondary | ICD-10-CM | POA: Diagnosis not present

## 2017-08-01 DIAGNOSIS — R2689 Other abnormalities of gait and mobility: Secondary | ICD-10-CM | POA: Diagnosis not present

## 2017-08-01 DIAGNOSIS — R2681 Unsteadiness on feet: Secondary | ICD-10-CM | POA: Diagnosis not present

## 2017-08-01 DIAGNOSIS — I2510 Atherosclerotic heart disease of native coronary artery without angina pectoris: Secondary | ICD-10-CM | POA: Diagnosis not present

## 2017-08-01 DIAGNOSIS — R4189 Other symptoms and signs involving cognitive functions and awareness: Secondary | ICD-10-CM | POA: Diagnosis not present

## 2017-08-01 DIAGNOSIS — M6281 Muscle weakness (generalized): Secondary | ICD-10-CM | POA: Diagnosis not present

## 2017-08-01 DIAGNOSIS — R262 Difficulty in walking, not elsewhere classified: Secondary | ICD-10-CM | POA: Diagnosis not present

## 2017-08-02 ENCOUNTER — Encounter: Payer: Self-pay | Admitting: Internal Medicine

## 2017-08-02 ENCOUNTER — Non-Acute Institutional Stay (SKILLED_NURSING_FACILITY): Payer: Medicare Other | Admitting: Internal Medicine

## 2017-08-02 DIAGNOSIS — Z950 Presence of cardiac pacemaker: Secondary | ICD-10-CM | POA: Diagnosis not present

## 2017-08-02 DIAGNOSIS — I351 Nonrheumatic aortic (valve) insufficiency: Secondary | ICD-10-CM

## 2017-08-02 DIAGNOSIS — R488 Other symbolic dysfunctions: Secondary | ICD-10-CM | POA: Diagnosis not present

## 2017-08-02 DIAGNOSIS — Z9989 Dependence on other enabling machines and devices: Secondary | ICD-10-CM

## 2017-08-02 DIAGNOSIS — I2510 Atherosclerotic heart disease of native coronary artery without angina pectoris: Secondary | ICD-10-CM | POA: Diagnosis not present

## 2017-08-02 DIAGNOSIS — R2681 Unsteadiness on feet: Secondary | ICD-10-CM | POA: Diagnosis not present

## 2017-08-02 DIAGNOSIS — I872 Venous insufficiency (chronic) (peripheral): Secondary | ICD-10-CM | POA: Diagnosis not present

## 2017-08-02 DIAGNOSIS — Z853 Personal history of malignant neoplasm of breast: Secondary | ICD-10-CM

## 2017-08-02 DIAGNOSIS — Z8679 Personal history of other diseases of the circulatory system: Secondary | ICD-10-CM | POA: Diagnosis not present

## 2017-08-02 DIAGNOSIS — G4733 Obstructive sleep apnea (adult) (pediatric): Secondary | ICD-10-CM | POA: Diagnosis not present

## 2017-08-02 DIAGNOSIS — M6281 Muscle weakness (generalized): Secondary | ICD-10-CM | POA: Diagnosis not present

## 2017-08-02 DIAGNOSIS — M810 Age-related osteoporosis without current pathological fracture: Secondary | ICD-10-CM | POA: Diagnosis not present

## 2017-08-02 DIAGNOSIS — E538 Deficiency of other specified B group vitamins: Secondary | ICD-10-CM | POA: Diagnosis not present

## 2017-08-02 DIAGNOSIS — E669 Obesity, unspecified: Secondary | ICD-10-CM | POA: Diagnosis not present

## 2017-08-02 DIAGNOSIS — F0150 Vascular dementia without behavioral disturbance: Secondary | ICD-10-CM | POA: Diagnosis not present

## 2017-08-02 DIAGNOSIS — R4189 Other symptoms and signs involving cognitive functions and awareness: Secondary | ICD-10-CM | POA: Diagnosis not present

## 2017-08-02 DIAGNOSIS — R262 Difficulty in walking, not elsewhere classified: Secondary | ICD-10-CM | POA: Diagnosis not present

## 2017-08-02 NOTE — Progress Notes (Addendum)
Patient ID: [name], female   DOB: Jan 05, 1929, 84 y.o.   MRN: [id number]  Provider:  Rexene Edison. Mariea Clonts, D.O., C.M.D. Location:  [place] Room Number: 154 rehab Place of Service:  SNF (31)  PCP: Gayland Curry, DO Patient Care Team: Gayland Curry, DO as PCP - General (Geriatric Medicine) Justice Britain, MD as Consulting Physician (Orthopedic Surgery) Gaynelle Arabian, MD as Consulting Physician (Orthopedic Surgery) Garvin Fila, MD as Consulting Physician (Neurology) Evans Lance, MD as Consulting Physician (Cardiology) Jettie Booze, MD as Consulting Physician (Interventional Cardiology) Sueanne Margarita, MD as Consulting Physician (Cardiology) Shon Hough, MD as Consulting Physician (Ophthalmology)  Extended Emergency Contact Information Primary Emergency Contact: Yamin,JED Address: [place], [place] Phone: [phone] Mobile Phone: [phone] Relation: Spouse Secondary Emergency Contact: Festus Holts of [place] Phone: [phone] Mobile Phone: [phone] Relation: Relative  Code Status: DNR--discussed and confirmed Goals of Care: Advanced Directive information Advanced Directives 08/02/2017  Does Patient Have a Medical Advance Directive? Yes  Type of Advance Directive [place]  Does patient want to make changes to medical advance directive? No - Patient declined  Copy of [place] in Chart? Yes  Pre-existing out of facility DNR order (yellow form or pink MOST form) -   Chief Complaint  Patient presents with  . New Admit To SNF    Rehab admission    HPI: Patient is a 84 y.o. female seen today for admission to [facility] due to progressive cognitive impairment and functional decline especially dramatic over the last 90 days.  She has a h/o CAD s/p MI and prior stent, chronic diastolic chf  followed by Dr. Irish Lack, depression, rotator cuff tendonitis (left s/p rotator cuff repair), macular degeneration followed by Dr. Kathrin Penner, left breast ca in 2008 with lumpectomy by Dr Lucia Gaskins and medical oncology by Dr. Jana Hakim, osteoporosis, OSA on CPAP, left ulnar neuropathy, hemorrhoids, colon polyps, htn, gait disorder after right hip surgery 2013-14, complete heart block s/p pacer in 10/15 followed by Dr. Lovena Le in the past, stasis dermatitis. She was referred by Dr. Inda Merlin to Dr. Oletta Lamas, memory care specialist, in [place] for evaluation.  He ordered 25OH vitamin D level, b12, folate, tsh, bmp, cbc with diff on 07/27/17 to be done here.  She has a h/o falls over the past couple of years.  She had a hip fx in 2013 s/p repair and a fall w/o injury in 2017.  Dr. Oletta Lamas ordered PT eval and tx here.  Notes indicate she was requiring assistance with bathing, dressing, walking with her walker, housework, meal prep, shopping, laundry and she is dependent with taking meds, managing money and driving.  She remained independent with eating, hygiene, transfers, toileting, continence and use of phone. She was not taking her lasix and potassium so she'd gained weight and become more edematous in her legs.  She also was only intermittently taking her zoloft.  She recently moved from their home to an [place] apt a bit after her husband got admitted to memory care.  The notes from Dr. Oletta Lamas note concerns about falls, leaving the stove on, not taking her meds and falling for scams.  She was noted to have impacted cerumen, as well.  He also mentioned neuropathic pain in the left leg.  She  had imaging of her brain done, as well.  CT brain 07/29/17  showed no acute intracranial abnormality, but moderate atrophy with moderate small vessel ischemic changes of white matter slightly progressed since 2015, carotid vascular calcification.  CXR showed no acute cardiopulmonary abnormality (done for sob, weight gain, chf), stable  cardiomegaly, atherosclerosis of thoracic aorta.  Carotid dopplers showed:  "Atherosclerotic disease in the carotid arteries, most prominent at the right carotid bulb.  Estimated degree of stenosis in the right internal carotid artery is 50-69%.  Estimated degree of stenosis in the left internal carotid artery is less than 50%.  Right external carotid artery stenosis.  Patent vertebral arteries with antegrade flow."  Interestingly, I found an old MRI from 03/05/14 with the following:  "Area of parenchymal hemorrhage/ subarachnoid hemorrhage is confirmed from recent CT in the RIGHT frontal lobe. Suspect subcortical shearing injury from recent fall, potentiated by anticoagulation with Plavix.  BILATERAL fairly symmetric restricted diffusion in the parasagittal anterior frontal lobes. I believe the most likely consideration is some regional effect of subarachnoid hemorrhage, but cannot exclude recent epileptiform activity. Mild cerebritis or ischemia are less favored.  Atrophy and small vessel disease."  It appears that at that time, she'd had a syncopal episode with subsequent headache and severe htn.  She was found to have transient complete heart block at that time with persistent symptomatic 2:1 heart block.  This led to her pacemaker placement.  Her plavix was stopped and she underwent rehab here.    When I saw her, she did not really understand why she was in rehab.  There are plans for a move to AL level of care to be with her husband who is moving from memory care to AL enhanced.  She reports that she did not take her pills b/c some were too big to swallow (though she was not remembering most of the pills).  Having them in applesauce or pudding is helping.   Past Medical History:  Diagnosis Date  . Breast cancer ([place])   . Colon polyps   . Coronary artery disease 06/2006   STEMI inferior with BMS to mid RCA with mid basal inferior wall HK and luminal irrgularities elsewhere  . Fracture of femoral  neck, right ([place]) 06/13/2011  . Fracture of humeral head, right, closed 06/13/2011  . GERD (gastroesophageal reflux disease)   . Hemorrhoids   . Hyperlipemia   . Hypertension   . LBBB (left bundle branch block)   . Macular degeneration   . Myocardial infarction ([place])   . Obstructive sleep apnea on CPAP   . Osteopenia   . Osteopenia   . Pacemaker 02/25/14  . Rhinitis   . Rotator cuff syndrome   . SAH (subarachnoid hemorrhage) ([place]) 02/22/14  . 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 Ulnar neuropathy   . Umbilical hernia   . Unstable gait    Past Surgical History:  Procedure Laterality Date  . BREAST LUMPECTOMY    . CORONARY ANGIOPLASTY WITH STENT PLACEMENT  06/2006   Mid RCA BMS  . HIP ARTHROPLASTY  06/06/2011   Procedure: ARTHROPLASTY BIPOLAR HIP;  Surgeon: Gearlean Alf;  Location: [facility] ORS;  Service: Orthopedics;  Laterality: Right;  . PERMANENT PACEMAKER INSERTION N/A 02/25/2014   Procedure: PERMANENT PACEMAKER INSERTION;  Surgeon: Evans Lance, MD;  Location: [facility] CATH LAB;  Service: Cardiovascular;  Laterality: N/A;  . SHOULDER SURGERY    . WRIST SURGERY      reports that she has never smoked. She has never used smokeless tobacco. She reports that she drinks alcohol. Her drug  history is not on file. Social History   Socioeconomic History  . Marital status: Married    Spouse name: Not on file  . Number of children: Not on file  . Years of education: Not on file  . Highest education level: Not on file  Occupational History  . Not on file  Social Needs  . Financial resource strain: Not on file  . Food insecurity:    Worry: Not on file    Inability: Not on file  . Transportation needs:    Medical: Not on file    Non-medical: Not on file  Tobacco Use  . Smoking status: Never Smoker  . Smokeless tobacco: Never Used  Substance and Sexual Activity  . Alcohol use: Yes    Alcohol/week: 0.0 oz    Comment: occasional wine  . Drug use: Not on file  . Sexual activity: Not on file    Lifestyle  . Physical activity:    Days per week: Not on file    Minutes per session: Not on file  . Stress: Not on file  Relationships  . Social connections:    Talks on phone: Not on file    Gets together: Not on file    Attends religious service: Not on file    Active member of club or organization: Not on file    Attends meetings of clubs or organizations: Not on file    Relationship status: Not on file  . Intimate partner violence:    Fear of current or ex partner: Not on file    Emotionally abused: Not on file    Physically abused: Not on file    Forced sexual activity: Not on file  Other Topics Concern  . Not on file  Social History Narrative  . Not on file    Functional Status Survey:    Family History  Problem Relation Age of Onset  . Heart attack Father   . CVA Sister     Health Maintenance  Topic Date Due  . PNA vac Low Risk Adult (2 of 2 - PPSV23) 05/09/2015  . INFLUENZA VACCINE  12/22/2016  . TETANUS/TDAP  01/17/2023  . DEXA SCAN  Completed    Allergies  Allergen Reactions  . Chlorpheniramine Anaphylaxis  . Codeine Anaphylaxis    Outpatient Encounter Medications as of 08/02/2017  Medication Sig  . aspirin EC 81 MG tablet Take 1 tablet (81 mg total) by mouth daily.  . Cholecalciferol (VITAMIN D3) 2000 UNITS TABS Take 1 tablet by mouth daily.  . Coenzyme Q10 (COQ10) 200 MG CAPS Take 1 tablet by mouth daily.  . furosemide (LASIX) 40 MG tablet Take 1 tablet (40 mg total) by mouth daily.  Marland Kitchen losartan (COZAAR) 100 MG tablet Take 100 mg by mouth daily.  . Multiple Vitamins-Minerals (PRESERVISION/LUTEIN) CAPS Take 1 capsule by mouth 2 (two) times daily.  . nebivolol (BYSTOLIC) 5 MG tablet Take 1 tablet (5 mg total) by mouth daily.  . nitroGLYCERIN (NITROSTAT) 0.4 MG SL tablet Place 1 tablet (0.4 mg total) under the tongue every 5 (five) minutes as needed for chest pain.  . potassium chloride (KLOR-CON) 20 MEQ packet Take 20 mEq by mouth daily.  .  sertraline (ZOLOFT) 50 MG tablet Take 50 mg by mouth daily.  Marland Kitchen triamcinolone cream (KENALOG) 0.1 % Apply 1 application topically as directed.  . vitamin B-12 (CYANOCOBALAMIN) 1000 MCG tablet Take 1,000 mcg by mouth daily.  . [DISCONTINUED] Potassium (POTASSIMIN PO) Take 1 tablet by mouth  daily.  . [DISCONTINUED] losartan (COZAAR) 50 MG tablet Take 50 mg by mouth daily.   No facility-administered encounter medications on file as of 08/02/2017.     Review of Systems  Constitutional: Positive for activity change. Negative for appetite change, chills and fever.  HENT: Positive for trouble swallowing. Negative for congestion.   Eyes: Negative for visual disturbance.  Respiratory: Negative for shortness of breath.   Cardiovascular: Negative for chest pain and palpitations.  Gastrointestinal: Negative for abdominal pain.  Genitourinary: Negative for dysuria.  Musculoskeletal: Positive for arthralgias and gait problem. Negative for myalgias.  Neurological: Positive for syncope and weakness. Negative for dizziness, speech difficulty, light-headedness, numbness and headaches.  Psychiatric/Behavioral: Positive for confusion. Negative for behavioral problems and sleep disturbance.       Depression    Vitals:   08/02/17 1039  BP: 138/68  Pulse: 61  Resp: 18  Temp: 98 F (36.7 C)  TempSrc: Oral  SpO2: 95%  Weight: 211 lb (95.7 kg)  Height: 5\' 6"  (1.676 m)   Body mass index is 34.06 kg/m. Physical Exam  Constitutional: She is oriented to person, place, and time. She appears well-developed and well-nourished. No distress.  HENT:  Head: Normocephalic and atraumatic.  Right Ear: External ear normal.  Left Ear: External ear normal.  Nose: Nose normal.  Mouth/Throat: No oropharyngeal exudate.  Reading glasses  Eyes: Pupils are equal, round, and reactive to light. Conjunctivae and EOM are normal.  Neck: Normal range of motion. Neck supple. No JVD present.  Abdominal: Soft. Bowel sounds are  normal. She exhibits no distension and no mass. There is no tenderness. There is no rebound and no guarding.  Musculoskeletal: Normal range of motion.  Using walker at present with unsteady gait  Lymphadenopathy:    She has no cervical adenopathy.  Neurological: She is alert and oriented to person, place, and time.  But short term memory poor, not aware of her meds  Skin: Skin is warm and dry.  Psychiatric: She has a normal mood and affect.    Labs reviewed: Basic Metabolic Panel: Recent Labs    07/27/17 1342  NA 135*  K 4.5  BUN 13  CREATININE 0.7   Liver Function Tests: Recent Labs    07/27/17 1342  AST 17  ALT 9  ALKPHOS 106   No results for input(s): LIPASE, AMYLASE in the last 8760 hours. No results for input(s): AMMONIA in the last 8760 hours. CBC: Recent Labs    07/27/17 1342  WBC 8.0  HGB 12.7  HCT 38  PLT 247   Cardiac Enzymes: No results for input(s): CKTOTAL, CKMB, CKMBINDEX, TROPONINI in the last 8760 hours. BNP: Invalid input(s): POCBNP No results found for: HGBA1C Lab Results  Component Value Date   TSH 2.84 07/27/2017   Lab Results  Component Value Date   VITAMINB12 331 07/27/2017   No results found for: FOLATE folate 16 No results found for: IRON, TIBC, FERRITIN  Imaging and Procedures obtained prior to SNF admission: Dg Chest 2 View  Result Date: 07/29/2017 CLINICAL DATA:  Congestive heart failure.  Shortness of breath. EXAM: CHEST - 2 VIEW COMPARISON:  Radiographs of February 26, 2014. FINDINGS: Stable cardiomegaly. Left-sided pacemaker is unchanged in position. Atherosclerosis of thoracic aorta is noted. No pneumothorax or pleural effusion is noted. No acute pulmonary disease is noted. Narrowing of right subacromial space is noted suggesting rotator cuff injury. IMPRESSION: No acute cardiopulmonary abnormality seen. Aortic Atherosclerosis (ICD10-I70.0). Electronically Signed   By: Jeneen Rinks  Murlean Caller, M.D.   On: 07/29/2017 16:53   Ct Head Wo  Contrast  Result Date: 07/29/2017 CLINICAL DATA:  Memory loss with occasional vertebral and bilateral extremity weakness EXAM: CT HEAD WITHOUT CONTRAST TECHNIQUE: Contiguous axial images were obtained from the base of the skull through the vertex without intravenous contrast. COMPARISON:  MRI 02/23/2014, CT brain 02/22/2014 FINDINGS: Brain: No acute territorial infarction, hemorrhage or intracranial mass is visualized. Moderate atrophy. Moderate small vessel ischemic changes of the white matter, slight progression since prior CT. Stable ventricle size. Vascular: No hyperdense vessel.  Carotid vascular calcification. Skull: No fracture Sinuses/Orbits: Mild mucosal thickening in the maxillary and ethmoid sinuses. No acute orbital abnormality. Other: None IMPRESSION: 1. No CT evidence for acute intracranial abnormality. 2. Moderate atrophy with moderate small vessel ischemic changes of the white matter, slight progression since previous CT from 2015. Electronically Signed   By: Donavan Foil M.D.   On: 07/29/2017 19:19   US Carotid Bilateral  Result Date: 07/29/2017 CLINICAL DATA:  CHF.  Carotid atherosclerosis.  Memory impairment. EXAM: BILATERAL CAROTID DUPLEX ULTRASOUND TECHNIQUE: Pearline Cables scale imaging, color Doppler and duplex ultrasound were performed of bilateral carotid and vertebral arteries in the neck. COMPARISON:  None. FINDINGS: Criteria: Quantification of carotid stenosis is based on velocity parameters that correlate the residual internal carotid diameter with NASCET-based stenosis levels, using the diameter of the distal internal carotid lumen as the denominator for stenosis measurement. The following velocity measurements were obtained: RIGHT ICA:  171 cm/sec CCA:  95 cm/sec SYSTOLIC ICA/CCA RATIO:  1.8 DIASTOLIC ICA/CCA RATIO:  2.0 ECA:  287 cm/sec LEFT ICA:  100 cm/sec CCA:  235 cm/sec SYSTOLIC ICA/CCA RATIO:  0.8 DIASTOLIC ICA/CCA RATIO:  1.6 ECA:  127 cm/sec RIGHT CAROTID ARTERY: Echogenic plaque  at the right carotid bulb. Posterior acoustic shadowing from calcified plaque at the carotid bulb. Elevated peak systolic velocity in the external carotid artery. Peak systolic velocity in the mid internal carotid artery is elevated, measuring 171 centimeters/second. RIGHT VERTEBRAL ARTERY: Antegrade flow in the right vertebral artery. Elevated peak systolic velocity, greater than 100 centimeters/second, of unknown significance. LEFT CAROTID ARTERY: Echogenic plaque at the left carotid bulb. Left common carotid artery is tortuous. External carotid artery is patent with normal waveform. Small amount of echogenic plaque in the proximal internal carotid artery. Normal waveforms and velocities in the internal carotid artery. LEFT VERTEBRAL ARTERY: Antegrade flow and normal waveform in the left vertebral artery. IMPRESSION: Atherosclerotic disease in the carotid arteries, most prominent at the right carotid bulb. Estimated degree of stenosis in the right internal carotid artery is 50-69%. Estimated degree of stenosis in the left internal carotid artery is less than 50%. Right external carotid artery stenosis. Patent vertebral arteries with antegrade flow. Electronically Signed   By: Markus Daft M.D.   On: 07/29/2017 18:09    Assessment/Plan 1. Vascular dementia without behavioral disturbance -suspect vascular given CAD, prior SAH (could consider post-traumatic, too as had this after syncope that led to pacemaker)  2. Personal history of subarachnoid hemorrhage -unsteady since around then by her report (2015) -cont walker, PT, OT and await AL bed prep  3. Aortic valve insufficiency, etiology of cardiac valve disease unspecified -stable, last echo 4 years ago with normal EF  4. Unstable gait -cont use of walker, therapy  5. Venous insufficiency -elevate feet at rest, use compression hose  6. Pacemaker -for syncopal episode with stokes adams attack, followed by cardiology, Dr. Irish Lack and Dr. Lovena Le    7.  Atherosclerosis of native coronary artery of native heart without angina pectoris -maintain bp, lipid, glucose control within reasonable quality of life parameters at 84 yo  8. Obstructive sleep apnea on CPAP -cont cpap  9. Obesity (BMI 30-39.9) -ongoing, says she enjoys eating, has not been one to do much exercise, likes reading and more sedentary activities  10. Age-related osteoporosis without current pathological fracture -due to prior fxs, not just osteopenia, cont vitamin D 2000 units po daily, should ideally be on medication for this with a bisphosphonate or prolia to prevent further fxs and stablize bone density, encourage regular walks and other weightbearing exercise  11. Personal history of breast cancer -date not in chart, but also not showing in epic, had prior lumpectomy  12.  b12 deficiency:  Begin b12 supplement  Family/ staff Communication: discussed with rehab nurse  Labs/tests ordered:  Just had full labs  Stacy Sailer L. William Schake, D.O. [facility] [address], [place] [address] Cell Phone (Mon-Fri 8am-5pm):  [phone] On Call:  [phone] & follow prompts after 5pm & weekends Office Phone:  [phone] Office Fax:  [phone]

## 2017-08-03 DIAGNOSIS — R4189 Other symptoms and signs involving cognitive functions and awareness: Secondary | ICD-10-CM | POA: Diagnosis not present

## 2017-08-03 DIAGNOSIS — R488 Other symbolic dysfunctions: Secondary | ICD-10-CM | POA: Diagnosis not present

## 2017-08-03 DIAGNOSIS — R2681 Unsteadiness on feet: Secondary | ICD-10-CM | POA: Diagnosis not present

## 2017-08-03 DIAGNOSIS — R262 Difficulty in walking, not elsewhere classified: Secondary | ICD-10-CM | POA: Diagnosis not present

## 2017-08-03 DIAGNOSIS — F0150 Vascular dementia without behavioral disturbance: Secondary | ICD-10-CM | POA: Diagnosis not present

## 2017-08-03 DIAGNOSIS — M6281 Muscle weakness (generalized): Secondary | ICD-10-CM | POA: Diagnosis not present

## 2017-08-04 DIAGNOSIS — R488 Other symbolic dysfunctions: Secondary | ICD-10-CM | POA: Diagnosis not present

## 2017-08-04 DIAGNOSIS — R4189 Other symptoms and signs involving cognitive functions and awareness: Secondary | ICD-10-CM | POA: Diagnosis not present

## 2017-08-04 DIAGNOSIS — R262 Difficulty in walking, not elsewhere classified: Secondary | ICD-10-CM | POA: Diagnosis not present

## 2017-08-04 DIAGNOSIS — R2681 Unsteadiness on feet: Secondary | ICD-10-CM | POA: Diagnosis not present

## 2017-08-04 DIAGNOSIS — R2689 Other abnormalities of gait and mobility: Secondary | ICD-10-CM | POA: Diagnosis not present

## 2017-08-04 DIAGNOSIS — M6281 Muscle weakness (generalized): Secondary | ICD-10-CM | POA: Diagnosis not present

## 2017-08-04 DIAGNOSIS — F0150 Vascular dementia without behavioral disturbance: Secondary | ICD-10-CM | POA: Diagnosis not present

## 2017-08-05 DIAGNOSIS — R262 Difficulty in walking, not elsewhere classified: Secondary | ICD-10-CM | POA: Diagnosis not present

## 2017-08-05 DIAGNOSIS — R2681 Unsteadiness on feet: Secondary | ICD-10-CM | POA: Diagnosis not present

## 2017-08-05 DIAGNOSIS — R4189 Other symptoms and signs involving cognitive functions and awareness: Secondary | ICD-10-CM | POA: Diagnosis not present

## 2017-08-05 DIAGNOSIS — F0150 Vascular dementia without behavioral disturbance: Secondary | ICD-10-CM | POA: Diagnosis not present

## 2017-08-05 DIAGNOSIS — R488 Other symbolic dysfunctions: Secondary | ICD-10-CM | POA: Diagnosis not present

## 2017-08-05 DIAGNOSIS — M6281 Muscle weakness (generalized): Secondary | ICD-10-CM | POA: Diagnosis not present

## 2017-08-06 DIAGNOSIS — F0150 Vascular dementia without behavioral disturbance: Secondary | ICD-10-CM | POA: Diagnosis not present

## 2017-08-06 DIAGNOSIS — R4189 Other symptoms and signs involving cognitive functions and awareness: Secondary | ICD-10-CM | POA: Diagnosis not present

## 2017-08-06 DIAGNOSIS — M6281 Muscle weakness (generalized): Secondary | ICD-10-CM | POA: Diagnosis not present

## 2017-08-06 DIAGNOSIS — R488 Other symbolic dysfunctions: Secondary | ICD-10-CM | POA: Diagnosis not present

## 2017-08-06 DIAGNOSIS — R262 Difficulty in walking, not elsewhere classified: Secondary | ICD-10-CM | POA: Diagnosis not present

## 2017-08-06 DIAGNOSIS — R2681 Unsteadiness on feet: Secondary | ICD-10-CM | POA: Diagnosis not present

## 2017-08-08 DIAGNOSIS — M6281 Muscle weakness (generalized): Secondary | ICD-10-CM | POA: Diagnosis not present

## 2017-08-08 DIAGNOSIS — R488 Other symbolic dysfunctions: Secondary | ICD-10-CM | POA: Diagnosis not present

## 2017-08-08 DIAGNOSIS — R4189 Other symptoms and signs involving cognitive functions and awareness: Secondary | ICD-10-CM | POA: Diagnosis not present

## 2017-08-08 DIAGNOSIS — R2681 Unsteadiness on feet: Secondary | ICD-10-CM | POA: Diagnosis not present

## 2017-08-08 DIAGNOSIS — R262 Difficulty in walking, not elsewhere classified: Secondary | ICD-10-CM | POA: Diagnosis not present

## 2017-08-08 DIAGNOSIS — F0150 Vascular dementia without behavioral disturbance: Secondary | ICD-10-CM | POA: Diagnosis not present

## 2017-08-09 DIAGNOSIS — R2681 Unsteadiness on feet: Secondary | ICD-10-CM | POA: Diagnosis not present

## 2017-08-09 DIAGNOSIS — R262 Difficulty in walking, not elsewhere classified: Secondary | ICD-10-CM | POA: Diagnosis not present

## 2017-08-09 DIAGNOSIS — R4189 Other symptoms and signs involving cognitive functions and awareness: Secondary | ICD-10-CM | POA: Diagnosis not present

## 2017-08-09 DIAGNOSIS — F0150 Vascular dementia without behavioral disturbance: Secondary | ICD-10-CM | POA: Diagnosis not present

## 2017-08-09 DIAGNOSIS — M6281 Muscle weakness (generalized): Secondary | ICD-10-CM | POA: Diagnosis not present

## 2017-08-09 DIAGNOSIS — R488 Other symbolic dysfunctions: Secondary | ICD-10-CM | POA: Diagnosis not present

## 2017-08-10 DIAGNOSIS — R2681 Unsteadiness on feet: Secondary | ICD-10-CM | POA: Diagnosis not present

## 2017-08-10 DIAGNOSIS — R488 Other symbolic dysfunctions: Secondary | ICD-10-CM | POA: Diagnosis not present

## 2017-08-10 DIAGNOSIS — R4189 Other symptoms and signs involving cognitive functions and awareness: Secondary | ICD-10-CM | POA: Diagnosis not present

## 2017-08-10 DIAGNOSIS — F0150 Vascular dementia without behavioral disturbance: Secondary | ICD-10-CM | POA: Diagnosis not present

## 2017-08-10 DIAGNOSIS — M6281 Muscle weakness (generalized): Secondary | ICD-10-CM | POA: Diagnosis not present

## 2017-08-10 DIAGNOSIS — R262 Difficulty in walking, not elsewhere classified: Secondary | ICD-10-CM | POA: Diagnosis not present

## 2017-08-11 DIAGNOSIS — Z853 Personal history of malignant neoplasm of breast: Secondary | ICD-10-CM | POA: Insufficient documentation

## 2017-08-11 DIAGNOSIS — E538 Deficiency of other specified B group vitamins: Secondary | ICD-10-CM | POA: Insufficient documentation

## 2017-08-11 DIAGNOSIS — M810 Age-related osteoporosis without current pathological fracture: Secondary | ICD-10-CM | POA: Insufficient documentation

## 2017-08-12 DIAGNOSIS — M6281 Muscle weakness (generalized): Secondary | ICD-10-CM | POA: Diagnosis not present

## 2017-08-12 DIAGNOSIS — R262 Difficulty in walking, not elsewhere classified: Secondary | ICD-10-CM | POA: Diagnosis not present

## 2017-08-12 DIAGNOSIS — R2681 Unsteadiness on feet: Secondary | ICD-10-CM | POA: Diagnosis not present

## 2017-08-12 DIAGNOSIS — R4189 Other symptoms and signs involving cognitive functions and awareness: Secondary | ICD-10-CM | POA: Diagnosis not present

## 2017-08-12 DIAGNOSIS — F0150 Vascular dementia without behavioral disturbance: Secondary | ICD-10-CM | POA: Diagnosis not present

## 2017-08-12 DIAGNOSIS — R488 Other symbolic dysfunctions: Secondary | ICD-10-CM | POA: Diagnosis not present

## 2017-08-13 DIAGNOSIS — M6281 Muscle weakness (generalized): Secondary | ICD-10-CM | POA: Diagnosis not present

## 2017-08-13 DIAGNOSIS — F0150 Vascular dementia without behavioral disturbance: Secondary | ICD-10-CM | POA: Diagnosis not present

## 2017-08-13 DIAGNOSIS — R4189 Other symptoms and signs involving cognitive functions and awareness: Secondary | ICD-10-CM | POA: Diagnosis not present

## 2017-08-13 DIAGNOSIS — R2681 Unsteadiness on feet: Secondary | ICD-10-CM | POA: Diagnosis not present

## 2017-08-13 DIAGNOSIS — R262 Difficulty in walking, not elsewhere classified: Secondary | ICD-10-CM | POA: Diagnosis not present

## 2017-08-13 DIAGNOSIS — R488 Other symbolic dysfunctions: Secondary | ICD-10-CM | POA: Diagnosis not present

## 2017-08-15 DIAGNOSIS — F0150 Vascular dementia without behavioral disturbance: Secondary | ICD-10-CM | POA: Diagnosis not present

## 2017-08-15 DIAGNOSIS — R2681 Unsteadiness on feet: Secondary | ICD-10-CM | POA: Diagnosis not present

## 2017-08-15 DIAGNOSIS — M6281 Muscle weakness (generalized): Secondary | ICD-10-CM | POA: Diagnosis not present

## 2017-08-15 DIAGNOSIS — R4189 Other symptoms and signs involving cognitive functions and awareness: Secondary | ICD-10-CM | POA: Diagnosis not present

## 2017-08-15 DIAGNOSIS — R488 Other symbolic dysfunctions: Secondary | ICD-10-CM | POA: Diagnosis not present

## 2017-08-15 DIAGNOSIS — R262 Difficulty in walking, not elsewhere classified: Secondary | ICD-10-CM | POA: Diagnosis not present

## 2017-08-16 DIAGNOSIS — R4189 Other symptoms and signs involving cognitive functions and awareness: Secondary | ICD-10-CM | POA: Diagnosis not present

## 2017-08-16 DIAGNOSIS — M6281 Muscle weakness (generalized): Secondary | ICD-10-CM | POA: Diagnosis not present

## 2017-08-16 DIAGNOSIS — R488 Other symbolic dysfunctions: Secondary | ICD-10-CM | POA: Diagnosis not present

## 2017-08-16 DIAGNOSIS — R262 Difficulty in walking, not elsewhere classified: Secondary | ICD-10-CM | POA: Diagnosis not present

## 2017-08-16 DIAGNOSIS — R2681 Unsteadiness on feet: Secondary | ICD-10-CM | POA: Diagnosis not present

## 2017-08-16 DIAGNOSIS — F0150 Vascular dementia without behavioral disturbance: Secondary | ICD-10-CM | POA: Diagnosis not present

## 2017-08-17 DIAGNOSIS — R262 Difficulty in walking, not elsewhere classified: Secondary | ICD-10-CM | POA: Diagnosis not present

## 2017-08-17 DIAGNOSIS — F0150 Vascular dementia without behavioral disturbance: Secondary | ICD-10-CM | POA: Diagnosis not present

## 2017-08-17 DIAGNOSIS — R488 Other symbolic dysfunctions: Secondary | ICD-10-CM | POA: Diagnosis not present

## 2017-08-17 DIAGNOSIS — M6281 Muscle weakness (generalized): Secondary | ICD-10-CM | POA: Diagnosis not present

## 2017-08-17 DIAGNOSIS — R2681 Unsteadiness on feet: Secondary | ICD-10-CM | POA: Diagnosis not present

## 2017-08-17 DIAGNOSIS — R4189 Other symptoms and signs involving cognitive functions and awareness: Secondary | ICD-10-CM | POA: Diagnosis not present

## 2017-08-18 DIAGNOSIS — R4189 Other symptoms and signs involving cognitive functions and awareness: Secondary | ICD-10-CM | POA: Diagnosis not present

## 2017-08-18 DIAGNOSIS — F0150 Vascular dementia without behavioral disturbance: Secondary | ICD-10-CM | POA: Diagnosis not present

## 2017-08-18 DIAGNOSIS — M6281 Muscle weakness (generalized): Secondary | ICD-10-CM | POA: Diagnosis not present

## 2017-08-18 DIAGNOSIS — R262 Difficulty in walking, not elsewhere classified: Secondary | ICD-10-CM | POA: Diagnosis not present

## 2017-08-18 DIAGNOSIS — R2681 Unsteadiness on feet: Secondary | ICD-10-CM | POA: Diagnosis not present

## 2017-08-18 DIAGNOSIS — R488 Other symbolic dysfunctions: Secondary | ICD-10-CM | POA: Diagnosis not present

## 2017-08-19 DIAGNOSIS — M6281 Muscle weakness (generalized): Secondary | ICD-10-CM | POA: Diagnosis not present

## 2017-08-19 DIAGNOSIS — F0150 Vascular dementia without behavioral disturbance: Secondary | ICD-10-CM | POA: Diagnosis not present

## 2017-08-19 DIAGNOSIS — R2681 Unsteadiness on feet: Secondary | ICD-10-CM | POA: Diagnosis not present

## 2017-08-19 DIAGNOSIS — R4189 Other symptoms and signs involving cognitive functions and awareness: Secondary | ICD-10-CM | POA: Diagnosis not present

## 2017-08-19 DIAGNOSIS — R488 Other symbolic dysfunctions: Secondary | ICD-10-CM | POA: Diagnosis not present

## 2017-08-19 DIAGNOSIS — R262 Difficulty in walking, not elsewhere classified: Secondary | ICD-10-CM | POA: Diagnosis not present

## 2017-08-22 DIAGNOSIS — R2681 Unsteadiness on feet: Secondary | ICD-10-CM | POA: Diagnosis not present

## 2017-08-22 DIAGNOSIS — R413 Other amnesia: Secondary | ICD-10-CM | POA: Diagnosis not present

## 2017-08-23 DIAGNOSIS — M6281 Muscle weakness (generalized): Secondary | ICD-10-CM | POA: Diagnosis not present

## 2017-08-23 DIAGNOSIS — F32A Depression, unspecified: Secondary | ICD-10-CM | POA: Insufficient documentation

## 2017-08-23 DIAGNOSIS — R413 Other amnesia: Secondary | ICD-10-CM | POA: Insufficient documentation

## 2017-08-23 DIAGNOSIS — R2689 Other abnormalities of gait and mobility: Secondary | ICD-10-CM | POA: Diagnosis not present

## 2017-08-23 DIAGNOSIS — F0150 Vascular dementia without behavioral disturbance: Secondary | ICD-10-CM | POA: Diagnosis not present

## 2017-08-23 DIAGNOSIS — R488 Other symbolic dysfunctions: Secondary | ICD-10-CM | POA: Diagnosis not present

## 2017-08-23 DIAGNOSIS — I2510 Atherosclerotic heart disease of native coronary artery without angina pectoris: Secondary | ICD-10-CM | POA: Diagnosis not present

## 2017-08-23 DIAGNOSIS — R262 Difficulty in walking, not elsewhere classified: Secondary | ICD-10-CM | POA: Diagnosis not present

## 2017-08-23 DIAGNOSIS — R4189 Other symptoms and signs involving cognitive functions and awareness: Secondary | ICD-10-CM | POA: Diagnosis not present

## 2017-08-23 DIAGNOSIS — R2681 Unsteadiness on feet: Secondary | ICD-10-CM | POA: Diagnosis not present

## 2017-08-29 DIAGNOSIS — M6281 Muscle weakness (generalized): Secondary | ICD-10-CM | POA: Diagnosis not present

## 2017-08-29 DIAGNOSIS — F0150 Vascular dementia without behavioral disturbance: Secondary | ICD-10-CM | POA: Diagnosis not present

## 2017-08-29 DIAGNOSIS — R488 Other symbolic dysfunctions: Secondary | ICD-10-CM | POA: Diagnosis not present

## 2017-08-29 DIAGNOSIS — R4189 Other symptoms and signs involving cognitive functions and awareness: Secondary | ICD-10-CM | POA: Diagnosis not present

## 2017-08-29 DIAGNOSIS — R2681 Unsteadiness on feet: Secondary | ICD-10-CM | POA: Diagnosis not present

## 2017-08-29 DIAGNOSIS — R262 Difficulty in walking, not elsewhere classified: Secondary | ICD-10-CM | POA: Diagnosis not present

## 2017-08-30 DIAGNOSIS — R2681 Unsteadiness on feet: Secondary | ICD-10-CM | POA: Diagnosis not present

## 2017-08-30 DIAGNOSIS — R262 Difficulty in walking, not elsewhere classified: Secondary | ICD-10-CM | POA: Diagnosis not present

## 2017-08-30 DIAGNOSIS — F0150 Vascular dementia without behavioral disturbance: Secondary | ICD-10-CM | POA: Diagnosis not present

## 2017-08-30 DIAGNOSIS — R488 Other symbolic dysfunctions: Secondary | ICD-10-CM | POA: Diagnosis not present

## 2017-08-30 DIAGNOSIS — R4189 Other symptoms and signs involving cognitive functions and awareness: Secondary | ICD-10-CM | POA: Diagnosis not present

## 2017-08-30 DIAGNOSIS — M6281 Muscle weakness (generalized): Secondary | ICD-10-CM | POA: Diagnosis not present

## 2017-08-31 ENCOUNTER — Ambulatory Visit: Payer: Self-pay | Admitting: Internal Medicine

## 2017-09-01 DIAGNOSIS — R4189 Other symptoms and signs involving cognitive functions and awareness: Secondary | ICD-10-CM | POA: Diagnosis not present

## 2017-09-01 DIAGNOSIS — R488 Other symbolic dysfunctions: Secondary | ICD-10-CM | POA: Diagnosis not present

## 2017-09-01 DIAGNOSIS — F0150 Vascular dementia without behavioral disturbance: Secondary | ICD-10-CM | POA: Diagnosis not present

## 2017-09-01 DIAGNOSIS — R262 Difficulty in walking, not elsewhere classified: Secondary | ICD-10-CM | POA: Diagnosis not present

## 2017-09-01 DIAGNOSIS — R2681 Unsteadiness on feet: Secondary | ICD-10-CM | POA: Diagnosis not present

## 2017-09-01 DIAGNOSIS — M6281 Muscle weakness (generalized): Secondary | ICD-10-CM | POA: Diagnosis not present

## 2017-09-05 DIAGNOSIS — R4189 Other symptoms and signs involving cognitive functions and awareness: Secondary | ICD-10-CM | POA: Diagnosis not present

## 2017-09-05 DIAGNOSIS — R2681 Unsteadiness on feet: Secondary | ICD-10-CM | POA: Diagnosis not present

## 2017-09-05 DIAGNOSIS — R262 Difficulty in walking, not elsewhere classified: Secondary | ICD-10-CM | POA: Diagnosis not present

## 2017-09-05 DIAGNOSIS — M6281 Muscle weakness (generalized): Secondary | ICD-10-CM | POA: Diagnosis not present

## 2017-09-05 DIAGNOSIS — F0150 Vascular dementia without behavioral disturbance: Secondary | ICD-10-CM | POA: Diagnosis not present

## 2017-09-05 DIAGNOSIS — R488 Other symbolic dysfunctions: Secondary | ICD-10-CM | POA: Diagnosis not present

## 2017-09-06 DIAGNOSIS — R262 Difficulty in walking, not elsewhere classified: Secondary | ICD-10-CM | POA: Diagnosis not present

## 2017-09-06 DIAGNOSIS — M6281 Muscle weakness (generalized): Secondary | ICD-10-CM | POA: Diagnosis not present

## 2017-09-06 DIAGNOSIS — R4189 Other symptoms and signs involving cognitive functions and awareness: Secondary | ICD-10-CM | POA: Diagnosis not present

## 2017-09-06 DIAGNOSIS — R2681 Unsteadiness on feet: Secondary | ICD-10-CM | POA: Diagnosis not present

## 2017-09-06 DIAGNOSIS — F0150 Vascular dementia without behavioral disturbance: Secondary | ICD-10-CM | POA: Diagnosis not present

## 2017-09-06 DIAGNOSIS — R488 Other symbolic dysfunctions: Secondary | ICD-10-CM | POA: Diagnosis not present

## 2017-09-08 DIAGNOSIS — F0150 Vascular dementia without behavioral disturbance: Secondary | ICD-10-CM | POA: Diagnosis not present

## 2017-09-08 DIAGNOSIS — R262 Difficulty in walking, not elsewhere classified: Secondary | ICD-10-CM | POA: Diagnosis not present

## 2017-09-08 DIAGNOSIS — R488 Other symbolic dysfunctions: Secondary | ICD-10-CM | POA: Diagnosis not present

## 2017-09-08 DIAGNOSIS — R2681 Unsteadiness on feet: Secondary | ICD-10-CM | POA: Diagnosis not present

## 2017-09-08 DIAGNOSIS — R4189 Other symptoms and signs involving cognitive functions and awareness: Secondary | ICD-10-CM | POA: Diagnosis not present

## 2017-09-08 DIAGNOSIS — M6281 Muscle weakness (generalized): Secondary | ICD-10-CM | POA: Diagnosis not present

## 2017-09-09 DIAGNOSIS — M6281 Muscle weakness (generalized): Secondary | ICD-10-CM | POA: Diagnosis not present

## 2017-09-09 DIAGNOSIS — R488 Other symbolic dysfunctions: Secondary | ICD-10-CM | POA: Diagnosis not present

## 2017-09-09 DIAGNOSIS — R2681 Unsteadiness on feet: Secondary | ICD-10-CM | POA: Diagnosis not present

## 2017-09-09 DIAGNOSIS — R4189 Other symptoms and signs involving cognitive functions and awareness: Secondary | ICD-10-CM | POA: Diagnosis not present

## 2017-09-09 DIAGNOSIS — F0150 Vascular dementia without behavioral disturbance: Secondary | ICD-10-CM | POA: Diagnosis not present

## 2017-09-09 DIAGNOSIS — R262 Difficulty in walking, not elsewhere classified: Secondary | ICD-10-CM | POA: Diagnosis not present

## 2017-09-12 DIAGNOSIS — R262 Difficulty in walking, not elsewhere classified: Secondary | ICD-10-CM | POA: Diagnosis not present

## 2017-09-12 DIAGNOSIS — F0150 Vascular dementia without behavioral disturbance: Secondary | ICD-10-CM | POA: Diagnosis not present

## 2017-09-12 DIAGNOSIS — R2681 Unsteadiness on feet: Secondary | ICD-10-CM | POA: Diagnosis not present

## 2017-09-12 DIAGNOSIS — R488 Other symbolic dysfunctions: Secondary | ICD-10-CM | POA: Diagnosis not present

## 2017-09-12 DIAGNOSIS — R4189 Other symptoms and signs involving cognitive functions and awareness: Secondary | ICD-10-CM | POA: Diagnosis not present

## 2017-09-12 DIAGNOSIS — M6281 Muscle weakness (generalized): Secondary | ICD-10-CM | POA: Diagnosis not present

## 2017-09-14 DIAGNOSIS — F0150 Vascular dementia without behavioral disturbance: Secondary | ICD-10-CM | POA: Diagnosis not present

## 2017-09-14 DIAGNOSIS — M6281 Muscle weakness (generalized): Secondary | ICD-10-CM | POA: Diagnosis not present

## 2017-09-14 DIAGNOSIS — R2681 Unsteadiness on feet: Secondary | ICD-10-CM | POA: Diagnosis not present

## 2017-09-14 DIAGNOSIS — R262 Difficulty in walking, not elsewhere classified: Secondary | ICD-10-CM | POA: Diagnosis not present

## 2017-09-14 DIAGNOSIS — R4189 Other symptoms and signs involving cognitive functions and awareness: Secondary | ICD-10-CM | POA: Diagnosis not present

## 2017-09-14 DIAGNOSIS — R488 Other symbolic dysfunctions: Secondary | ICD-10-CM | POA: Diagnosis not present

## 2017-09-15 DIAGNOSIS — R4189 Other symptoms and signs involving cognitive functions and awareness: Secondary | ICD-10-CM | POA: Diagnosis not present

## 2017-09-15 DIAGNOSIS — L603 Nail dystrophy: Secondary | ICD-10-CM | POA: Diagnosis not present

## 2017-09-15 DIAGNOSIS — R262 Difficulty in walking, not elsewhere classified: Secondary | ICD-10-CM | POA: Diagnosis not present

## 2017-09-15 DIAGNOSIS — R488 Other symbolic dysfunctions: Secondary | ICD-10-CM | POA: Diagnosis not present

## 2017-09-15 DIAGNOSIS — F0150 Vascular dementia without behavioral disturbance: Secondary | ICD-10-CM | POA: Diagnosis not present

## 2017-09-15 DIAGNOSIS — R2681 Unsteadiness on feet: Secondary | ICD-10-CM | POA: Diagnosis not present

## 2017-09-15 DIAGNOSIS — M6281 Muscle weakness (generalized): Secondary | ICD-10-CM | POA: Diagnosis not present

## 2017-09-16 DIAGNOSIS — M6281 Muscle weakness (generalized): Secondary | ICD-10-CM | POA: Diagnosis not present

## 2017-09-16 DIAGNOSIS — R4189 Other symptoms and signs involving cognitive functions and awareness: Secondary | ICD-10-CM | POA: Diagnosis not present

## 2017-09-16 DIAGNOSIS — R488 Other symbolic dysfunctions: Secondary | ICD-10-CM | POA: Diagnosis not present

## 2017-09-16 DIAGNOSIS — R262 Difficulty in walking, not elsewhere classified: Secondary | ICD-10-CM | POA: Diagnosis not present

## 2017-09-16 DIAGNOSIS — F0150 Vascular dementia without behavioral disturbance: Secondary | ICD-10-CM | POA: Diagnosis not present

## 2017-09-16 DIAGNOSIS — R2681 Unsteadiness on feet: Secondary | ICD-10-CM | POA: Diagnosis not present

## 2017-09-19 DIAGNOSIS — F0150 Vascular dementia without behavioral disturbance: Secondary | ICD-10-CM | POA: Diagnosis not present

## 2017-09-19 DIAGNOSIS — R2681 Unsteadiness on feet: Secondary | ICD-10-CM | POA: Diagnosis not present

## 2017-09-19 DIAGNOSIS — R262 Difficulty in walking, not elsewhere classified: Secondary | ICD-10-CM | POA: Diagnosis not present

## 2017-09-19 DIAGNOSIS — R4189 Other symptoms and signs involving cognitive functions and awareness: Secondary | ICD-10-CM | POA: Diagnosis not present

## 2017-09-19 DIAGNOSIS — M6281 Muscle weakness (generalized): Secondary | ICD-10-CM | POA: Diagnosis not present

## 2017-09-19 DIAGNOSIS — R488 Other symbolic dysfunctions: Secondary | ICD-10-CM | POA: Diagnosis not present

## 2017-09-20 DIAGNOSIS — R262 Difficulty in walking, not elsewhere classified: Secondary | ICD-10-CM | POA: Diagnosis not present

## 2017-09-20 DIAGNOSIS — R2681 Unsteadiness on feet: Secondary | ICD-10-CM | POA: Diagnosis not present

## 2017-09-20 DIAGNOSIS — R488 Other symbolic dysfunctions: Secondary | ICD-10-CM | POA: Diagnosis not present

## 2017-09-20 DIAGNOSIS — R4189 Other symptoms and signs involving cognitive functions and awareness: Secondary | ICD-10-CM | POA: Diagnosis not present

## 2017-09-20 DIAGNOSIS — F0150 Vascular dementia without behavioral disturbance: Secondary | ICD-10-CM | POA: Diagnosis not present

## 2017-09-20 DIAGNOSIS — M6281 Muscle weakness (generalized): Secondary | ICD-10-CM | POA: Diagnosis not present

## 2017-09-21 DIAGNOSIS — R41841 Cognitive communication deficit: Secondary | ICD-10-CM | POA: Diagnosis not present

## 2017-09-21 DIAGNOSIS — M6281 Muscle weakness (generalized): Secondary | ICD-10-CM | POA: Diagnosis not present

## 2017-09-21 DIAGNOSIS — I2510 Atherosclerotic heart disease of native coronary artery without angina pectoris: Secondary | ICD-10-CM | POA: Diagnosis not present

## 2017-09-21 DIAGNOSIS — R41840 Attention and concentration deficit: Secondary | ICD-10-CM | POA: Diagnosis not present

## 2017-09-21 DIAGNOSIS — R41844 Frontal lobe and executive function deficit: Secondary | ICD-10-CM | POA: Diagnosis not present

## 2017-09-21 DIAGNOSIS — F0150 Vascular dementia without behavioral disturbance: Secondary | ICD-10-CM | POA: Diagnosis not present

## 2017-09-21 DIAGNOSIS — R3989 Other symptoms and signs involving the genitourinary system: Secondary | ICD-10-CM | POA: Diagnosis not present

## 2017-09-21 DIAGNOSIS — N3946 Mixed incontinence: Secondary | ICD-10-CM | POA: Diagnosis not present

## 2017-09-21 DIAGNOSIS — R413 Other amnesia: Secondary | ICD-10-CM | POA: Diagnosis not present

## 2017-09-21 DIAGNOSIS — R350 Frequency of micturition: Secondary | ICD-10-CM | POA: Diagnosis not present

## 2017-09-22 DIAGNOSIS — R3989 Other symptoms and signs involving the genitourinary system: Secondary | ICD-10-CM | POA: Diagnosis not present

## 2017-09-22 DIAGNOSIS — F0150 Vascular dementia without behavioral disturbance: Secondary | ICD-10-CM | POA: Diagnosis not present

## 2017-09-22 DIAGNOSIS — N3946 Mixed incontinence: Secondary | ICD-10-CM | POA: Diagnosis not present

## 2017-09-22 DIAGNOSIS — R41841 Cognitive communication deficit: Secondary | ICD-10-CM | POA: Diagnosis not present

## 2017-09-22 DIAGNOSIS — R41844 Frontal lobe and executive function deficit: Secondary | ICD-10-CM | POA: Diagnosis not present

## 2017-09-22 DIAGNOSIS — I2510 Atherosclerotic heart disease of native coronary artery without angina pectoris: Secondary | ICD-10-CM | POA: Diagnosis not present

## 2017-09-23 DIAGNOSIS — R41841 Cognitive communication deficit: Secondary | ICD-10-CM | POA: Diagnosis not present

## 2017-09-23 DIAGNOSIS — F0150 Vascular dementia without behavioral disturbance: Secondary | ICD-10-CM | POA: Diagnosis not present

## 2017-09-23 DIAGNOSIS — R41844 Frontal lobe and executive function deficit: Secondary | ICD-10-CM | POA: Diagnosis not present

## 2017-09-23 DIAGNOSIS — I2510 Atherosclerotic heart disease of native coronary artery without angina pectoris: Secondary | ICD-10-CM | POA: Diagnosis not present

## 2017-09-23 DIAGNOSIS — R3989 Other symptoms and signs involving the genitourinary system: Secondary | ICD-10-CM | POA: Diagnosis not present

## 2017-09-23 DIAGNOSIS — N3946 Mixed incontinence: Secondary | ICD-10-CM | POA: Diagnosis not present

## 2017-09-26 DIAGNOSIS — I2510 Atherosclerotic heart disease of native coronary artery without angina pectoris: Secondary | ICD-10-CM | POA: Diagnosis not present

## 2017-09-26 DIAGNOSIS — R41841 Cognitive communication deficit: Secondary | ICD-10-CM | POA: Diagnosis not present

## 2017-09-26 DIAGNOSIS — N3946 Mixed incontinence: Secondary | ICD-10-CM | POA: Diagnosis not present

## 2017-09-26 DIAGNOSIS — R3989 Other symptoms and signs involving the genitourinary system: Secondary | ICD-10-CM | POA: Diagnosis not present

## 2017-09-26 DIAGNOSIS — F0150 Vascular dementia without behavioral disturbance: Secondary | ICD-10-CM | POA: Diagnosis not present

## 2017-09-26 DIAGNOSIS — R41844 Frontal lobe and executive function deficit: Secondary | ICD-10-CM | POA: Diagnosis not present

## 2017-09-27 DIAGNOSIS — N3946 Mixed incontinence: Secondary | ICD-10-CM | POA: Diagnosis not present

## 2017-09-27 DIAGNOSIS — F0150 Vascular dementia without behavioral disturbance: Secondary | ICD-10-CM | POA: Diagnosis not present

## 2017-09-27 DIAGNOSIS — I2510 Atherosclerotic heart disease of native coronary artery without angina pectoris: Secondary | ICD-10-CM | POA: Diagnosis not present

## 2017-09-27 DIAGNOSIS — R3989 Other symptoms and signs involving the genitourinary system: Secondary | ICD-10-CM | POA: Diagnosis not present

## 2017-09-27 DIAGNOSIS — R41844 Frontal lobe and executive function deficit: Secondary | ICD-10-CM | POA: Diagnosis not present

## 2017-09-27 DIAGNOSIS — R41841 Cognitive communication deficit: Secondary | ICD-10-CM | POA: Diagnosis not present

## 2017-09-28 ENCOUNTER — Non-Acute Institutional Stay: Payer: Medicare Other | Admitting: Internal Medicine

## 2017-09-28 ENCOUNTER — Encounter: Payer: Self-pay | Admitting: Internal Medicine

## 2017-09-28 VITALS — BP 158/70 | HR 63 | Temp 97.7°F | Ht 66.0 in | Wt 223.0 lb

## 2017-09-28 DIAGNOSIS — I872 Venous insufficiency (chronic) (peripheral): Secondary | ICD-10-CM | POA: Diagnosis not present

## 2017-09-28 DIAGNOSIS — E669 Obesity, unspecified: Secondary | ICD-10-CM | POA: Diagnosis not present

## 2017-09-28 DIAGNOSIS — Z8679 Personal history of other diseases of the circulatory system: Secondary | ICD-10-CM

## 2017-09-28 DIAGNOSIS — R41844 Frontal lobe and executive function deficit: Secondary | ICD-10-CM | POA: Diagnosis not present

## 2017-09-28 DIAGNOSIS — R3989 Other symptoms and signs involving the genitourinary system: Secondary | ICD-10-CM | POA: Diagnosis not present

## 2017-09-28 DIAGNOSIS — N3946 Mixed incontinence: Secondary | ICD-10-CM | POA: Diagnosis not present

## 2017-09-28 DIAGNOSIS — I351 Nonrheumatic aortic (valve) insufficiency: Secondary | ICD-10-CM

## 2017-09-28 DIAGNOSIS — I6523 Occlusion and stenosis of bilateral carotid arteries: Secondary | ICD-10-CM

## 2017-09-28 DIAGNOSIS — F329 Major depressive disorder, single episode, unspecified: Secondary | ICD-10-CM

## 2017-09-28 DIAGNOSIS — R41841 Cognitive communication deficit: Secondary | ICD-10-CM | POA: Diagnosis not present

## 2017-09-28 DIAGNOSIS — R2681 Unsteadiness on feet: Secondary | ICD-10-CM | POA: Diagnosis not present

## 2017-09-28 DIAGNOSIS — F0150 Vascular dementia without behavioral disturbance: Secondary | ICD-10-CM

## 2017-09-28 DIAGNOSIS — I2510 Atherosclerotic heart disease of native coronary artery without angina pectoris: Secondary | ICD-10-CM | POA: Diagnosis not present
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
## 2017-10-03 DIAGNOSIS — N3946 Mixed incontinence: Secondary | ICD-10-CM | POA: Diagnosis not present

## 2017-10-03 DIAGNOSIS — F0150 Vascular dementia without behavioral disturbance: Secondary | ICD-10-CM | POA: Diagnosis not present

## 2017-10-03 DIAGNOSIS — R3989 Other symptoms and signs involving the genitourinary system: Secondary | ICD-10-CM | POA: Diagnosis not present

## 2017-10-03 DIAGNOSIS — I2510 Atherosclerotic heart disease of native coronary artery without angina pectoris: Secondary | ICD-10-CM | POA: Diagnosis not present

## 2017-10-03 DIAGNOSIS — R41844 Frontal lobe and executive function deficit: Secondary | ICD-10-CM | POA: Diagnosis not present

## 2017-10-03 DIAGNOSIS — R41841 Cognitive communication deficit: Secondary | ICD-10-CM | POA: Diagnosis not present

## 2017-10-04 ENCOUNTER — Non-Acute Institutional Stay: Payer: Medicare Other

## 2017-10-04 DIAGNOSIS — Z0000 Encounter for general adult medical examination without abnormal findings: Secondary | ICD-10-CM | POA: Diagnosis not present

## 2017-10-04 DIAGNOSIS — R41844 Frontal lobe and executive function deficit: Secondary | ICD-10-CM | POA: Diagnosis not present

## 2017-10-04 DIAGNOSIS — I2510 Atherosclerotic heart disease of native coronary artery without angina pectoris: Secondary | ICD-10-CM | POA: Diagnosis not present

## 2017-10-04 DIAGNOSIS — N3946 Mixed incontinence: Secondary | ICD-10-CM | POA: Diagnosis not present

## 2017-10-04 DIAGNOSIS — R3989 Other symptoms and signs involving the genitourinary system: Secondary | ICD-10-CM | POA: Diagnosis not present

## 2017-10-04 DIAGNOSIS — R41841 Cognitive communication deficit: Secondary | ICD-10-CM | POA: Diagnosis not present

## 2017-10-04 DIAGNOSIS — F0150 Vascular dementia without behavioral disturbance: Secondary | ICD-10-CM | POA: Diagnosis not present

## 2017-10-04 NOTE — Patient Instructions (Addendum)
[name] , Thank you for taking time to come for your Medicare Wellness Visit. I appreciate your ongoing commitment to your health goals. Please review the following plan we discussed and let me know if I can assist you in the future.   Screening recommendations/referrals: Colonoscopy excluded, over age 82 Mammogram excluded, over age 79 Bone Density u pto date Recommended yearly ophthalmology/optometry visit for glaucoma screening and checkup Recommended yearly dental visit for hygiene and checkup  Vaccinations: Influenza vaccine up to date, due 2019 fall season Pneumococcal vaccine 23 due, ordered Tdap vaccine up to date, due 01/17/2023 Shingles vaccine due, previously ordered  Advanced directives: In chart  Conditions/risks identified: none  Next appointment: Dr. Mariea Clonts 11/09/2017 @ 3:30pm   Preventive Care 65 Years and Older, Female Preventive care refers to lifestyle choices and visits with your health care provider that can promote health and wellness. What does preventive care include?  A yearly physical exam. This is also called an annual well check.  Dental exams once or twice a year.  Routine eye exams. Ask your health care provider how often you should have your eyes checked.  Personal lifestyle choices, including:  Daily care of your teeth and gums.  Regular physical activity.  Eating a healthy diet.  Avoiding tobacco and drug use.  Limiting alcohol use.  Practicing safe sex.  Taking low-dose aspirin every day.  Taking vitamin and mineral supplements as recommended by your health care provider. What happens during an annual well check? The services and screenings done by your health care provider during your annual well check will depend on your age, overall health, lifestyle risk factors, and family history of disease. Counseling  Your health care provider may ask you questions about your:  Alcohol use.  Tobacco use.  Drug use.  Emotional  well-being.  Home and relationship well-being.  Sexual activity.  Eating habits.  History of falls.  Memory and ability to understand (cognition).  Work and work Statistician.  Reproductive health. Screening  You may have the following tests or measurements:  Height, weight, and BMI.  Blood pressure.  Lipid and cholesterol levels. These may be checked every 5 years, or more frequently if you are over 33 years old.  Skin check.  Lung cancer screening. You may have this screening every year starting at age 52 if you have a 30-pack-year history of smoking and currently smoke or have quit within the past 15 years.  Fecal occult blood test (FOBT) of the stool. You may have this test every year starting at age 5.  Flexible sigmoidoscopy or colonoscopy. You may have a sigmoidoscopy every 5 years or a colonoscopy every 10 years starting at age 36.  Hepatitis C blood test.  Hepatitis B blood test.  Sexually transmitted disease (STD) testing.  Diabetes screening. This is done by checking your blood sugar (glucose) after you have not eaten for a while (fasting). You may have this done every 1-3 years.  Bone density scan. This is done to screen for osteoporosis. You may have this done starting at age 22.  Mammogram. This may be done every 1-2 years. Talk to your health care provider about how often you should have regular mammograms. Talk with your health care provider about your test results, treatment options, and if necessary, the need for more tests. Vaccines  Your health care provider may recommend certain vaccines, such as:  Influenza vaccine. This is recommended every year.  Tetanus, diphtheria, and acellular pertussis (Tdap, Td) vaccine. You  may need a Td booster every 10 years.  Zoster vaccine. You may need this after age 27.  Pneumococcal 13-valent conjugate (PCV13) vaccine. One dose is recommended after age 30.  Pneumococcal polysaccharide (PPSV23) vaccine. One  dose is recommended after age 65. Talk to your health care provider about which screenings and vaccines you need and how often you need them. This information is not intended to replace advice given to you by your health care provider. Make sure you discuss any questions you have with your health care provider. Document Released: 06/06/2015 Document Revised: 01/28/2016 Document Reviewed: 03/11/2015 Elsevier Interactive Patient Education  2017 [place] Prevention in the Home Falls can cause injuries. They can happen to people of all ages. There are many things you can do to make your home safe and to help prevent falls. What can I do on the outside of my home?  Regularly fix the edges of walkways and driveways and fix any cracks.  Remove anything that might make you trip as you walk through a door, such as a raised step or threshold.  Trim any bushes or trees on the path to your home.  Use bright outdoor lighting.  Clear any walking paths of anything that might make someone trip, such as rocks or tools.  Regularly check to see if handrails are loose or broken. Make sure that both sides of any steps have handrails.  Any raised decks and porches should have guardrails on the edges.  Have any leaves, snow, or ice cleared regularly.  Use sand or salt on walking paths during winter.  Clean up any spills in your garage right away. This includes oil or grease spills. What can I do in the bathroom?  Use night lights.  Install grab bars by the toilet and in the tub and shower. Do not use towel bars as grab bars.  Use non-skid mats or decals in the tub or shower.  If you need to sit down in the shower, use a plastic, non-slip stool.  Keep the floor dry. Clean up any water that spills on the floor as soon as it happens.  Remove soap buildup in the tub or shower regularly.  Attach bath mats securely with double-sided non-slip rug tape.  Do not have throw rugs and other  things on the floor that can make you trip. What can I do in the bedroom?  Use night lights.  Make sure that you have a light by your bed that is easy to reach.  Do not use any sheets or blankets that are too big for your bed. They should not hang down onto the floor.  Have a firm chair that has side arms. You can use this for support while you get dressed.  Do not have throw rugs and other things on the floor that can make you trip. What can I do in the kitchen?  Clean up any spills right away.  Avoid walking on wet floors.  Keep items that you use a lot in easy-to-reach places.  If you need to reach something above you, use a strong step stool that has a grab bar.  Keep electrical cords out of the way.  Do not use floor polish or wax that makes floors slippery. If you must use wax, use non-skid floor wax.  Do not have throw rugs and other things on the floor that can make you trip. What can I do with my stairs?  Do not leave any items  on the stairs.  Make sure that there are handrails on both sides of the stairs and use them. Fix handrails that are broken or loose. Make sure that handrails are as long as the stairways.  Check any carpeting to make sure that it is firmly attached to the stairs. Fix any carpet that is loose or worn.  Avoid having throw rugs at the top or bottom of the stairs. If you do have throw rugs, attach them to the floor with carpet tape.  Make sure that you have a light switch at the top of the stairs and the bottom of the stairs. If you do not have them, ask someone to add them for you. What else can I do to help prevent falls?  Wear shoes that:  Do not have high heels.  Have rubber bottoms.  Are comfortable and fit you well.  Are closed at the toe. Do not wear sandals.  If you use a stepladder:  Make sure that it is fully opened. Do not climb a closed stepladder.  Make sure that both sides of the stepladder are locked into place.  Ask  someone to hold it for you, if possible.  Clearly mark and make sure that you can see:  Any grab bars or handrails.  First and last steps.  Where the edge of each step is.  Use tools that help you move around (mobility aids) if they are needed. These include:  Canes.  Walkers.  Scooters.  Crutches.  Turn on the lights when you go into a dark area. Replace any light bulbs as soon as they burn out.  Set up your furniture so you have a clear path. Avoid moving your furniture around.  If any of your floors are uneven, fix them.  If there are any pets around you, be aware of where they are.  Review your medicines with your doctor. Some medicines can make you feel dizzy. This can increase your chance of falling. Ask your doctor what other things that you can do to help prevent falls. This information is not intended to replace advice given to you by your health care provider. Make sure you discuss any questions you have with your health care provider. Document Released: 03/06/2009 Document Revised: 10/16/2015 Document Reviewed: 06/14/2014 Elsevier Interactive Patient Education  2017 [employer].

## 2017-10-04 NOTE — Progress Notes (Addendum)
Subjective:   [name] is a 84 y.o. female who presents for Medicare Annual (Subsequent) preventive examination at [facility]-03/04/2016       Objective:     Vitals: BP 140/68 (BP Location: Right Arm, Patient Position: Sitting)   Pulse 60   Temp (!) 97 F (36.1 C) (Oral)   Ht 5\' 6"  (1.676 m)   Wt 211 lb (95.7 kg)   SpO2 98%   BMI 34.06 kg/m   Body mass index is 34.06 kg/m.  Advanced Directives 10/04/2017 09/28/2017 08/02/2017 03/03/2014 02/22/2014 06/06/2011 06/06/2011  Does Patient Have a Medical Advance Directive? Yes Yes Yes Yes Yes Patient has advance directive, copy not in chart Patient does not have advance directive  Type of Advance Directive [place];Out of facility DNR (pink MOST or yellow form) [place];Out of facility DNR (pink MOST or yellow form) Healthcare Power of Attorney Living will Living will [place];Living will -  Does patient want to make changes to medical advance directive? No - Patient declined No - Patient declined No - Patient declined No - Patient declined No - Patient declined - -  Copy of [place] in Chart? Yes Yes Yes Yes No - copy requested Copy requested from family -  Pre-existing out of facility DNR order (yellow form or pink MOST form) Yellow form placed in chart (order not valid for inpatient use) Yellow form placed in chart (order not valid for inpatient use) - - - No -    Tobacco Social History   Tobacco Use  Smoking Status Never Smoker  Smokeless Tobacco Never Used     Counseling given: Not Answered   Clinical Intake:  Pre-visit preparation completed: No  Pain : No/denies pain     Nutritional Risks: None Diabetes: No  How often do you need to have someone help you when you read instructions, pamphlets, or other written materials from your doctor or pharmacy?: 1 - Never What is the last grade level you completed in  school?: Graduate  Interpreter Needed?: No  Information entered by :: Tyson Dense, RN  Past Medical History:  Diagnosis Date  . Breast cancer ([place])   . Colon polyps   . Coronary artery disease 06/2006   STEMI inferior with BMS to mid RCA with mid basal inferior wall HK and luminal irrgularities elsewhere  . Fracture of femoral neck, right ([place]) 06/13/2011  . Fracture of humeral head, right, closed 06/13/2011  . GERD (gastroesophageal reflux disease)   . Hemorrhoids   . Hyperlipemia   . Hypertension   . LBBB (left bundle branch block)   . Macular degeneration   . Myocardial infarction ([place])   . Obstructive sleep apnea on CPAP   . Osteopenia   . Osteopenia   . Pacemaker 02/25/14  . Rhinitis   . Rotator cuff syndrome   . SAH (subarachnoid hemorrhage) ([place]) 02/22/14  . 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 Ulnar neuropathy   . Umbilical hernia   . Unstable gait    Past Surgical History:  Procedure Laterality Date  . BREAST LUMPECTOMY    . CATARACT EXTRACTION, BILATERAL     Per [facility]  . CORONARY ANGIOPLASTY WITH STENT PLACEMENT  06/2006   Mid RCA BMS  . HIP ARTHROPLASTY  06/06/2011   Procedure: ARTHROPLASTY BIPOLAR HIP;  Surgeon: Gearlean Alf;  Location: [facility];  Service: Orthopedics;  Laterality: Right;  . PERMANENT PACEMAKER  INSERTION N/A 02/25/2014   Procedure: PERMANENT PACEMAKER INSERTION;  Surgeon: Evans Lance, MD;  Location: [facility] CATH LAB;  Service: Cardiovascular;  Laterality: N/A;  . SHOULDER SURGERY    . TONSILLECTOMY     Per [facility]   . WRIST SURGERY     Family History  Problem Relation Age of Onset  . Heart attack Father 45  . CAD Father   . CVA Sister    Social History   Socioeconomic History  . Marital status: Married    Spouse name: Not on file  . Number of children: Not on file  . Years of education: Not on file  . Highest education level: Not on file  Occupational History  . Not on file  Social Needs  . Financial  resource strain: Not hard at all  . Food insecurity:    Worry: Never true    Inability: Never true  . Transportation needs:    Medical: No    Non-medical: No  Tobacco Use  . Smoking status: Never Smoker  . Smokeless tobacco: Never Used  Substance and Sexual Activity  . Alcohol use: Yes    Alcohol/week: 0.0 oz    Comment: occasional wine  . Drug use: Not on file  . Sexual activity: Not on file  Lifestyle  . Physical activity:    Days per week: 0 days    Minutes per session: 0 min  . Stress: Only a little  Relationships  . Social connections:    Talks on phone: More than three times a week    Gets together: More than three times a week    Attends religious service: Never    Active member of club or organization: No    Attends meetings of clubs or organizations: Never    Relationship status: Married  Other Topics Concern  . Not on file  Social History Narrative  . Not on file    Outpatient Encounter Medications as of 10/04/2017  Medication Sig  . aspirin EC 81 MG tablet Take 81 mg by mouth daily.  . Cholecalciferol (VITAMIN D3) 2000 UNITS TABS Take 3,000 Units by mouth daily.   . Coenzyme Q10 (COQ10) 200 MG CAPS Take 1 tablet by mouth daily.  . DULoxetine (CYMBALTA) 30 MG capsule Take 30 mg by mouth daily.   . furosemide (LASIX) 40 MG tablet Take 1 tablet (40 mg total) by mouth daily.  Marland Kitchen losartan (COZAAR) 100 MG tablet Take 100 mg by mouth daily.  . Multiple Vitamins-Minerals (PRESERVISION/LUTEIN) CAPS Take 1 capsule by mouth 2 (two) times daily.  . nebivolol (BYSTOLIC) 5 MG tablet Take 1 tablet (5 mg total) by mouth daily.  . nitroGLYCERIN (NITROSTAT) 0.4 MG SL tablet Place 1 tablet (0.4 mg total) under the tongue every 5 (five) minutes as needed for chest pain.  . potassium chloride SA (K-DUR,KLOR-CON) 20 MEQ tablet Take 20 mEq by mouth daily.   Marland Kitchen triamcinolone (KENALOG) 0.025 % cream Apply 1 application topically 2 (two) times daily.   . vitamin B-12 (CYANOCOBALAMIN)  1000 MCG tablet Take 1,000 mcg by mouth daily.   No facility-administered encounter medications on file as of 10/04/2017.     Activities of Daily Living In your present state of health, do you have any difficulty performing the following activities: 10/04/2017  Hearing? N  Vision? N  Difficulty concentrating or making decisions? Y  Walking or climbing stairs? Y  Dressing or bathing? N  Doing errands, shopping? Y  Conservation officer, nature and  eating ? N  Using the Toilet? N  In the past six months, have you accidently leaked urine? Y  Do you have problems with loss of bowel control? N  Managing your Medications? Y  Managing your Finances? Y  Housekeeping or managing your Housekeeping? Y  Some recent data might be hidden    Patient Care Team: Gayland Curry, DO as PCP - General (Geriatric Medicine) Justice Britain, MD as Consulting Physician (Orthopedic Surgery) Gaynelle Arabian, MD as Consulting Physician (Orthopedic Surgery) Garvin Fila, MD as Consulting Physician (Neurology) Evans Lance, MD as Consulting Physician (Cardiology) Jettie Booze, MD as Consulting Physician (Interventional Cardiology) Sueanne Margarita, MD as Consulting Physician (Cardiology) Shon Hough, MD as Consulting Physician (Ophthalmology)    Assessment:   This is a routine wellness examination for [name].  Exercise Activities and Dietary recommendations Current Exercise Habits: The patient does not participate in regular exercise at present, Exercise limited by: orthopedic condition(s)  Goals    None      Fall Risk Fall Risk  10/04/2017 09/28/2017 03/03/2014  Falls in the past year? No No Yes  Number falls in past yr: - - 1  Injury with Fall? - - Yes  Risk Factor Category  - - High Fall Risk  Risk for fall due to : - - History of fall(s);Impaired balance/gait   Is the patient's home free of loose throw rugs in walkways, pet beds, electrical cords, etc?   yes      Grab bars in the bathroom?  yes      Handrails on the stairs?   yes      Adequate lighting?   yes  Depression Screen PHQ 2/9 Scores 10/04/2017 09/28/2017 03/03/2014  PHQ - 2 Score 1 0 0     Cognitive Function completed within last year MMSE - Mini Mental State Exam 07/30/2017  Orientation to time 5  Orientation to Place 5  Registration 3  Attention/ Calculation 5  Recall 3  Language- name 2 objects 2  Language- repeat 1  Language- follow 3 step command 3  Language- read & follow direction 1  Write a sentence 1  Copy design 1  Total score 30        Immunization History  Administered Date(s) Administered  . Influenza, High Dose Seasonal PF 02/18/2017  . Influenza-Unspecified 03/04/2016  . Pneumococcal Conjugate-13 05/08/2014  . Td 07/09/2002, 01/16/2013  . Zoster 06/30/2010    Qualifies for Shingles Vaccine? Yes, previously ordered  Screening Tests Health Maintenance  Topic Date Due  . PNA vac Low Risk Adult (2 of 2 - PPSV23) 05/09/2015  . INFLUENZA VACCINE  12/22/2017  . TETANUS/TDAP  01/17/2023  . DEXA SCAN  Completed    Cancer Screenings: Lung: Low Dose CT Chest recommended if Age 19-80 years, 30 pack-year currently smoking OR have quit w/in 15years. Patient does not qualify. Breast:  Up to date on Mammogram? Yes   Up to date of Bone Density/Dexa? Yes Colorectal: up to date  Additional Screenings:  Hepatitis C Screening: declined PNA 23 due-ordered     Plan:    I have personally reviewed and addressed the Medicare Annual Wellness questionnaire and have noted the following in the patient's chart:  A. Medical and social history B. Use of alcohol, tobacco or illicit drugs  C. Current medications and supplements D. Functional ability and status E.  Nutritional status F.  Physical activity G. Advance directives H. List of other physicians I.  Hospitalizations, surgeries, and  ER visits in previous 12 months J.  [place] to include hearing, vision, cognitive,  depression L. Referrals and appointments - none  In addition, I have reviewed and discussed with patient certain preventive protocols, quality metrics, and best practice recommendations. A written personalized care plan for preventive services as well as general preventive health recommendations were provided to patient.  See attached scanned questionnaire for additional information.   Signed,   Tyson Dense, RN Nurse Health Advisor  Patient Concerns: Incontinence

## 2017-10-06 DIAGNOSIS — F0150 Vascular dementia without behavioral disturbance: Secondary | ICD-10-CM | POA: Diagnosis not present

## 2017-10-06 DIAGNOSIS — Z23 Encounter for immunization: Secondary | ICD-10-CM | POA: Diagnosis not present

## 2017-10-06 DIAGNOSIS — R41844 Frontal lobe and executive function deficit: Secondary | ICD-10-CM | POA: Diagnosis not present

## 2017-10-06 DIAGNOSIS — R3989 Other symptoms and signs involving the genitourinary system: Secondary | ICD-10-CM | POA: Diagnosis not present

## 2017-10-06 DIAGNOSIS — R41841 Cognitive communication deficit: Secondary | ICD-10-CM | POA: Diagnosis not present

## 2017-10-06 DIAGNOSIS — I2510 Atherosclerotic heart disease of native coronary artery without angina pectoris: Secondary | ICD-10-CM | POA: Diagnosis not present

## 2017-10-06 DIAGNOSIS — N3946 Mixed incontinence: Secondary | ICD-10-CM | POA: Diagnosis not present

## 2017-10-07 DIAGNOSIS — R41841 Cognitive communication deficit: Secondary | ICD-10-CM | POA: Diagnosis not present

## 2017-10-07 DIAGNOSIS — I2510 Atherosclerotic heart disease of native coronary artery without angina pectoris: Secondary | ICD-10-CM | POA: Diagnosis not present

## 2017-10-07 DIAGNOSIS — F0150 Vascular dementia without behavioral disturbance: Secondary | ICD-10-CM | POA: Diagnosis not present

## 2017-10-07 DIAGNOSIS — R3989 Other symptoms and signs involving the genitourinary system: Secondary | ICD-10-CM | POA: Diagnosis not present

## 2017-10-07 DIAGNOSIS — R41844 Frontal lobe and executive function deficit: Secondary | ICD-10-CM | POA: Diagnosis not present

## 2017-10-07 DIAGNOSIS — N3946 Mixed incontinence: Secondary | ICD-10-CM | POA: Diagnosis not present

## 2017-10-10 DIAGNOSIS — N3946 Mixed incontinence: Secondary | ICD-10-CM | POA: Diagnosis not present

## 2017-10-10 DIAGNOSIS — I2510 Atherosclerotic heart disease of native coronary artery without angina pectoris: Secondary | ICD-10-CM | POA: Diagnosis not present

## 2017-10-10 DIAGNOSIS — R3989 Other symptoms and signs involving the genitourinary system: Secondary | ICD-10-CM | POA: Diagnosis not present

## 2017-10-10 DIAGNOSIS — R41844 Frontal lobe and executive function deficit: Secondary | ICD-10-CM | POA: Diagnosis not present

## 2017-10-10 DIAGNOSIS — F0150 Vascular dementia without behavioral disturbance: Secondary | ICD-10-CM | POA: Diagnosis not present

## 2017-10-10 DIAGNOSIS — R41841 Cognitive communication deficit: Secondary | ICD-10-CM | POA: Diagnosis not present

## 2017-10-11 ENCOUNTER — Non-Acute Institutional Stay: Payer: Medicare Other | Admitting: Internal Medicine

## 2017-10-11 ENCOUNTER — Encounter: Payer: Self-pay | Admitting: Internal Medicine

## 2017-10-11 DIAGNOSIS — F329 Major depressive disorder, single episode, unspecified: Secondary | ICD-10-CM | POA: Diagnosis not present

## 2017-10-11 DIAGNOSIS — L739 Follicular disorder, unspecified: Secondary | ICD-10-CM

## 2017-10-11 DIAGNOSIS — I2510 Atherosclerotic heart disease of native coronary artery without angina pectoris: Secondary | ICD-10-CM | POA: Diagnosis not present

## 2017-10-11 DIAGNOSIS — L03213 Periorbital cellulitis: Secondary | ICD-10-CM | POA: Diagnosis not present

## 2017-10-11 DIAGNOSIS — R41844 Frontal lobe and executive function deficit: Secondary | ICD-10-CM | POA: Diagnosis not present

## 2017-10-11 DIAGNOSIS — N3946 Mixed incontinence: Secondary | ICD-10-CM | POA: Diagnosis not present

## 2017-10-11 DIAGNOSIS — B372 Candidiasis of skin and nail: Secondary | ICD-10-CM | POA: Diagnosis not present

## 2017-10-11 DIAGNOSIS — R3981 Functional urinary incontinence: Secondary | ICD-10-CM

## 2017-10-11 DIAGNOSIS — F0150 Vascular dementia without behavioral disturbance: Secondary | ICD-10-CM

## 2017-10-11 DIAGNOSIS — R3989 Other symptoms and signs involving the genitourinary system: Secondary | ICD-10-CM | POA: Diagnosis not present

## 2017-10-11 DIAGNOSIS — R41841 Cognitive communication deficit: Secondary | ICD-10-CM | POA: Diagnosis not present

## 2017-10-11 NOTE — Progress Notes (Signed)
Patient ID: [name], female   DOB: 1928/12/12, 84 y.o.   MRN: [id number]  Location:  [facility] Room Number: 782 Place of Service:  ALF [phone]) Provider:   Gayland Curry, DO  Patient Care Team: Gayland Curry, DO as PCP - General (Geriatric Medicine) Justice Britain, MD as Consulting Physician (Orthopedic Surgery) Gaynelle Arabian, MD as Consulting Physician (Orthopedic Surgery) Garvin Fila, MD as Consulting Physician (Neurology) Evans Lance, MD as Consulting Physician (Cardiology) Jettie Booze, MD as Consulting Physician (Interventional Cardiology) Sueanne Margarita, MD as Consulting Physician (Cardiology) Shon Hough, MD as Consulting Physician (Ophthalmology)  Extended Emergency Contact Information Primary Emergency Contact: [place],JED Address: [place], [place] Phone: [phone] Mobile Phone: [phone] Relation: Son Secondary Emergency Contact: [place] Mobile Phone: [phone] Relation: Daughter  Code Status:  DNR Goals of care: Advanced Directive information Advanced Directives 10/11/2017  Does Patient Have a Medical Advance Directive? Yes  Type of Paramedic of [place];Out of facility DNR (pink MOST or yellow form)  Does patient want to make changes to medical advance directive? No - Patient declined  Copy of [place] in Chart? Yes  Pre-existing out of facility DNR order (yellow form or pink MOST form) Yellow form placed in chart (order not valid for inpatient use)   Chief Complaint  Patient presents with  . Acute Visit    rash in groin area, reddness around eye    HPI:  Pt is a 84 y.o. female with dementia, h/o breast cancer, obesity, GERD, depression, htn, B12 deficiency, CAD s/p STEMI, hyperlipidemia and prior subarachnoid hemorrhage seen today for an acute visit for a rash in her groin area and  redness around her right eye.  She is now living in assisted living.  She has urinary incontinence which has been progressive since her move to health care (possibly just notable now by staff).  She was noted to have an erythematous rash of her labia, vagina, perirectal area that is pruritic and sore with some hard bumps present.  She has no open vesicles.  She's been afebrile.  She has been seen itching her peri-area and then rubbing her right eye.  The eye itself does not appear injected, but the surrounding skin is red and warm, and she says itching her.  She's had no drainage from the eye.  She notes no visual changes or pain in the eye.    Past Medical History:  Diagnosis Date  . Breast cancer ([place])   . Colon polyps   . Coronary artery disease 06/2006   STEMI inferior with BMS to mid RCA with mid basal inferior wall HK and luminal irrgularities elsewhere  . Fracture of femoral neck, right ([place]) 06/13/2011  . Fracture of humeral head, right, closed 06/13/2011  . GERD (gastroesophageal reflux disease)   . Hemorrhoids   . Hyperlipemia   . Hypertension   . LBBB (left bundle branch block)   . Macular degeneration   . Myocardial infarction ([place])   . Obstructive sleep apnea on CPAP   . Osteopenia   . Osteopenia   . Pacemaker 02/25/14  . Rhinitis   . Rotator cuff syndrome   . SAH (subarachnoid hemorrhage) ([place]) 02/22/14  . 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 Ulnar neuropathy   . Umbilical hernia   . Unstable gait    Past Surgical History:  Procedure  Laterality Date  . BREAST LUMPECTOMY    . CATARACT EXTRACTION, BILATERAL     Per [facility]  . CORONARY ANGIOPLASTY WITH STENT PLACEMENT  06/2006   Mid RCA BMS  . HIP ARTHROPLASTY  06/06/2011   Procedure: ARTHROPLASTY BIPOLAR HIP;  Surgeon: Gearlean Alf;  [facility];  Service: Orthopedics;  Laterality: Right;  . PERMANENT PACEMAKER INSERTION N/A 02/25/2014   Procedure: PERMANENT PACEMAKER INSERTION;  Surgeon: Evans Lance,  MD;  Location: [facility] CATH LAB;  Service: Cardiovascular;  Laterality: N/A;  . SHOULDER SURGERY    . TONSILLECTOMY     Per [facility]   . WRIST SURGERY      Allergies  Allergen Reactions  . Chlorpheniramine Anaphylaxis  . Codeine Anaphylaxis  . Amlodipine     Per [facility]     Outpatient Encounter Medications as of 10/11/2017  Medication Sig  . aspirin EC 81 MG tablet Take 81 mg by mouth daily.  . cephALEXin (KEFLEX) 500 MG capsule Take 500 mg by mouth 2 (two) times daily.  . Cholecalciferol (VITAMIN D3) 2000 UNITS TABS Take 3,000 Units by mouth daily.   . Coenzyme Q10 (COQ10) 200 MG CAPS Take 1 tablet by mouth daily.  . DULoxetine (CYMBALTA) 30 MG capsule Take 30 mg by mouth daily.   . furosemide (LASIX) 40 MG tablet Take 1 tablet (40 mg total) by mouth daily.  . Hypromellose (ARTIFICIAL TEARS OP) Apply 1 drop to eye 2 (two) times daily as needed (dry eyes).  Marland Kitchen losartan (COZAAR) 100 MG tablet Take 100 mg by mouth daily.  . Multiple Vitamins-Minerals (PRESERVISION/LUTEIN) CAPS Take 1 capsule by mouth 2 (two) times daily.  . nebivolol (BYSTOLIC) 5 MG tablet Take 1 tablet (5 mg total) by mouth daily.  . nitroGLYCERIN (NITROSTAT) 0.4 MG SL tablet Place 1 tablet (0.4 mg total) under the tongue every 5 (five) minutes as needed for chest pain.  Marland Kitchen nystatin (NYSTATIN) powder Apply topically 2 (two) times daily.  . potassium chloride SA (K-DUR,KLOR-CON) 20 MEQ tablet Take 20 mEq by mouth daily.   Marland Kitchen triamcinolone (KENALOG) 0.025 % cream Apply 1 application topically 2 (two) times daily.   . vitamin B-12 (CYANOCOBALAMIN) 1000 MCG tablet Take 1,000 mcg by mouth daily.   No facility-administered encounter medications on file as of 10/11/2017.     Review of Systems  Constitutional: Negative for activity change, appetite change, chills and fever.       Eating very well and gaining weight  HENT: Negative for congestion and trouble swallowing.   Eyes: Negative for  photophobia, pain, discharge, redness, itching and visual disturbance.       Redness around eye and itching around eye  Respiratory: Negative for chest tightness, shortness of breath and wheezing.        Dyspneic on exertion, but not at rest or walking short distances  Cardiovascular: Positive for leg swelling. Negative for chest pain and palpitations.  Gastrointestinal: Negative for abdominal pain, constipation, diarrhea and nausea.  Genitourinary: Positive for dysuria, frequency and urgency. Negative for difficulty urinating and flank pain.       Urinary incontinence progressing  Musculoskeletal: Positive for gait problem.       Uses walker  Skin: Positive for rash. Negative for wound.  Neurological: Negative for dizziness and weakness.  Hematological: Bruises/bleeds easily.  Psychiatric/Behavioral: Positive for confusion. Negative for agitation, behavioral problems and sleep disturbance.    Immunization History  Administered Date(s) Administered  . Influenza, High  Dose Seasonal PF 02/18/2017  . Influenza-Unspecified 03/04/2016  . Pneumococcal Conjugate-13 05/08/2014  . Pneumococcal Polysaccharide-23 10/08/2017  . Td 07/09/2002, 01/16/2013  . Zoster 06/30/2010   Pertinent  Health Maintenance Due  Topic Date Due  . PNA vac Low Risk Adult (2 of 2 - PPSV23) 05/09/2015  . INFLUENZA VACCINE  12/22/2017  . DEXA SCAN  Completed   Fall Risk  10/04/2017 09/28/2017 03/03/2014  Falls in the past year? No No Yes  Number falls in past yr: - - 1  Injury with Fall? - - Yes  Risk Factor Category  - - High Fall Risk  Risk for fall due to : - - History of fall(s);Impaired balance/gait   Functional Status Survey:    Vitals:   10/11/17 1540  BP: (!) 147/67  Pulse: 64  Resp: 16  Temp: 97.9 F (36.6 C)  TempSrc: Oral  SpO2: 96%  Weight: 217 lb (98.4 kg)  Height: 5\' 6"  (1.676 m)   Body mass index is 35.02 kg/m. Physical Exam  Constitutional: She appears well-developed and  well-nourished. No distress.  Eyes: Pupils are equal, round, and reactive to light. Conjunctivae and EOM are normal. Right eye exhibits no discharge. Left eye exhibits no discharge. No scleral icterus.  No conjunctival or scleral injection  Cardiovascular: Normal rate and regular rhythm.  Murmur heard. Pulmonary/Chest: Effort normal and breath sounds normal. No respiratory distress.  Abdominal: Soft. Bowel sounds are normal.  Musculoskeletal: Normal range of motion.  Walks with walker  Neurological: She is alert.  Oriented to person, place, not time, poor historian  Skin:  Erythema of labia, vagina, perirectal area, small raised papules around follicles of labia, tender to touch, no visible vesicles or drainage; pt is incontinent; also has erythema of periorbital skin of right eye  Psychiatric: She has a normal mood and affect.    Labs reviewed: Recent Labs    07/27/17 1342 07/28/17 0700  NA 135* 136*  K 4.5 4.2  BUN 13 14  CREATININE 0.7 0.5   Recent Labs    07/27/17 1342  AST 17  ALT 9  ALKPHOS 106   Recent Labs    07/27/17 1342 07/28/17 0700  WBC 8.0 6.8  HGB 12.7 13.0  HCT 38 39  PLT 247 210   Lab Results  Component Value Date   TSH 4.05 07/28/2017   No results found for: HGBA1C Lab Results  Component Value Date   CHOL 214 (A) 07/27/2017   HDL 60 07/27/2017   LDLCALC 134 07/27/2017   TRIG 104 07/27/2017    Assessment/Plan 1. Periorbital cellulitis of right eye -due to rubbing her eye after itching her perineum -educated on hygiene, but short-term memory poor -will treat with keflex 500mg  po bid for one week -encourage yogurt and water intake while on abx -if pt develops visual changes, difficulty with increasing swelling of the eye, pain in the eye or erythema of the eye itself, it will be an ophtho emergency and she'll need to see her eye doctor right away  2. Folliculitis of perineum -with cellulitis -treat with keflex -work on closer monitor of  her personal hygiene with regular pad changes -may need more caregiver hours to ensure this happens so infection resolves  3. Functional urinary incontinence -progressive as memory loss has worsened -has not been faithful about pad and depend changes on her own and gets distracted by her husband's condition and behaviors when he's staying in the room with her -may need caregiver to consistently  help with this  4. Vascular dementia without behavioral disturbance -progressive memory loss, prior SAH, known CAD all suggest this cause -has not had improvement with tx of b12 deficiency or tx of her depression  5. Reactive depression -changed to cymbalta from zoloft by her memory specialist in [place] -spirits do seem improved  6.  Skin candidiasis -change from nystatin cream to powder to her groin area for this infection  Family/ staff Communication: discussed with [place], nurse manager, CNA  Labs/tests ordered:  Cbc   Nollie Shiflett L. Violet Cart, D.O. [facility] [address], [place] [address] Cell Phone (Mon-Fri 8am-5pm):  [phone] On Call:  [phone] & follow prompts after 5pm & weekends Office Phone:  [phone] Office Fax:  [phone]

## 2017-10-12 DIAGNOSIS — R41841 Cognitive communication deficit: Secondary | ICD-10-CM | POA: Diagnosis not present

## 2017-10-12 DIAGNOSIS — N3946 Mixed incontinence: Secondary | ICD-10-CM | POA: Diagnosis not present

## 2017-10-12 DIAGNOSIS — R3989 Other symptoms and signs involving the genitourinary system: Secondary | ICD-10-CM | POA: Diagnosis not present

## 2017-10-12 DIAGNOSIS — I2510 Atherosclerotic heart disease of native coronary artery without angina pectoris: Secondary | ICD-10-CM | POA: Diagnosis not present

## 2017-10-12 DIAGNOSIS — R41844 Frontal lobe and executive function deficit: Secondary | ICD-10-CM | POA: Diagnosis not present

## 2017-10-12 DIAGNOSIS — F0150 Vascular dementia without behavioral disturbance: Secondary | ICD-10-CM | POA: Diagnosis not present

## 2017-10-13 DIAGNOSIS — R413 Other amnesia: Secondary | ICD-10-CM | POA: Diagnosis not present

## 2017-10-13 DIAGNOSIS — F329 Major depressive disorder, single episode, unspecified: Secondary | ICD-10-CM | POA: Diagnosis not present

## 2017-10-13 DIAGNOSIS — N3946 Mixed incontinence: Secondary | ICD-10-CM | POA: Diagnosis not present

## 2017-10-13 DIAGNOSIS — R3989 Other symptoms and signs involving the genitourinary system: Secondary | ICD-10-CM | POA: Diagnosis not present

## 2017-10-13 DIAGNOSIS — R41841 Cognitive communication deficit: Secondary | ICD-10-CM | POA: Diagnosis not present

## 2017-10-13 DIAGNOSIS — R41844 Frontal lobe and executive function deficit: Secondary | ICD-10-CM | POA: Diagnosis not present

## 2017-10-13 DIAGNOSIS — I2510 Atherosclerotic heart disease of native coronary artery without angina pectoris: Secondary | ICD-10-CM | POA: Diagnosis not present

## 2017-10-13 DIAGNOSIS — F0150 Vascular dementia without behavioral disturbance: Secondary | ICD-10-CM | POA: Diagnosis not present

## 2017-10-14 DIAGNOSIS — R0989 Other specified symptoms and signs involving the circulatory and respiratory systems: Secondary | ICD-10-CM | POA: Insufficient documentation

## 2017-10-14 DIAGNOSIS — Z8679 Personal history of other diseases of the circulatory system: Secondary | ICD-10-CM | POA: Insufficient documentation

## 2017-10-14 DIAGNOSIS — Z955 Presence of coronary angioplasty implant and graft: Secondary | ICD-10-CM | POA: Insufficient documentation

## 2017-10-17 DIAGNOSIS — R41844 Frontal lobe and executive function deficit: Secondary | ICD-10-CM | POA: Diagnosis not present

## 2017-10-17 DIAGNOSIS — F0150 Vascular dementia without behavioral disturbance: Secondary | ICD-10-CM | POA: Diagnosis not present

## 2017-10-17 DIAGNOSIS — N3946 Mixed incontinence: Secondary | ICD-10-CM | POA: Diagnosis not present

## 2017-10-17 DIAGNOSIS — R3989 Other symptoms and signs involving the genitourinary system: Secondary | ICD-10-CM | POA: Diagnosis not present

## 2017-10-17 DIAGNOSIS — R41841 Cognitive communication deficit: Secondary | ICD-10-CM | POA: Diagnosis not present

## 2017-10-17 DIAGNOSIS — I2510 Atherosclerotic heart disease of native coronary artery without angina pectoris: Secondary | ICD-10-CM | POA: Diagnosis not present

## 2017-10-19 DIAGNOSIS — I2510 Atherosclerotic heart disease of native coronary artery without angina pectoris: Secondary | ICD-10-CM | POA: Diagnosis not present

## 2017-10-19 DIAGNOSIS — N3946 Mixed incontinence: Secondary | ICD-10-CM | POA: Diagnosis not present

## 2017-10-19 DIAGNOSIS — R41841 Cognitive communication deficit: Secondary | ICD-10-CM | POA: Diagnosis not present

## 2017-10-19 DIAGNOSIS — R41844 Frontal lobe and executive function deficit: Secondary | ICD-10-CM | POA: Diagnosis not present

## 2017-10-19 DIAGNOSIS — R3989 Other symptoms and signs involving the genitourinary system: Secondary | ICD-10-CM | POA: Diagnosis not present

## 2017-10-19 DIAGNOSIS — F0150 Vascular dementia without behavioral disturbance: Secondary | ICD-10-CM | POA: Diagnosis not present

## 2017-10-20 DIAGNOSIS — N3946 Mixed incontinence: Secondary | ICD-10-CM | POA: Diagnosis not present

## 2017-10-20 DIAGNOSIS — R3989 Other symptoms and signs involving the genitourinary system: Secondary | ICD-10-CM | POA: Diagnosis not present

## 2017-10-20 DIAGNOSIS — F0150 Vascular dementia without behavioral disturbance: Secondary | ICD-10-CM | POA: Diagnosis not present

## 2017-10-20 DIAGNOSIS — R41841 Cognitive communication deficit: Secondary | ICD-10-CM | POA: Diagnosis not present

## 2017-10-20 DIAGNOSIS — I2510 Atherosclerotic heart disease of native coronary artery without angina pectoris: Secondary | ICD-10-CM | POA: Diagnosis not present

## 2017-10-20 DIAGNOSIS — R41844 Frontal lobe and executive function deficit: Secondary | ICD-10-CM | POA: Diagnosis not present

## 2017-10-21 DIAGNOSIS — R41841 Cognitive communication deficit: Secondary | ICD-10-CM | POA: Diagnosis not present

## 2017-10-21 DIAGNOSIS — R41844 Frontal lobe and executive function deficit: Secondary | ICD-10-CM | POA: Diagnosis not present

## 2017-10-21 DIAGNOSIS — I2510 Atherosclerotic heart disease of native coronary artery without angina pectoris: Secondary | ICD-10-CM | POA: Diagnosis not present

## 2017-10-21 DIAGNOSIS — F0150 Vascular dementia without behavioral disturbance: Secondary | ICD-10-CM | POA: Diagnosis not present

## 2017-10-21 DIAGNOSIS — R3981 Functional urinary incontinence: Secondary | ICD-10-CM | POA: Insufficient documentation

## 2017-10-21 DIAGNOSIS — R3989 Other symptoms and signs involving the genitourinary system: Secondary | ICD-10-CM | POA: Diagnosis not present

## 2017-10-21 DIAGNOSIS — N3946 Mixed incontinence: Secondary | ICD-10-CM | POA: Diagnosis not present

## 2017-10-24 ENCOUNTER — Encounter: Payer: Self-pay | Admitting: Adult Health

## 2017-10-24 ENCOUNTER — Non-Acute Institutional Stay: Payer: Medicare Other | Admitting: Adult Health

## 2017-10-24 DIAGNOSIS — R3989 Other symptoms and signs involving the genitourinary system: Secondary | ICD-10-CM | POA: Diagnosis not present

## 2017-10-24 DIAGNOSIS — B3731 Acute candidiasis of vulva and vagina: Secondary | ICD-10-CM

## 2017-10-24 DIAGNOSIS — R41844 Frontal lobe and executive function deficit: Secondary | ICD-10-CM | POA: Diagnosis not present

## 2017-10-24 DIAGNOSIS — B373 Candidiasis of vulva and vagina: Secondary | ICD-10-CM | POA: Diagnosis not present

## 2017-10-24 DIAGNOSIS — L03213 Periorbital cellulitis: Secondary | ICD-10-CM | POA: Diagnosis not present

## 2017-10-24 DIAGNOSIS — R41840 Attention and concentration deficit: Secondary | ICD-10-CM | POA: Diagnosis not present

## 2017-10-24 DIAGNOSIS — R413 Other amnesia: Secondary | ICD-10-CM | POA: Diagnosis not present

## 2017-10-24 DIAGNOSIS — N3946 Mixed incontinence: Secondary | ICD-10-CM | POA: Diagnosis not present

## 2017-10-24 DIAGNOSIS — R350 Frequency of micturition: Secondary | ICD-10-CM | POA: Diagnosis not present

## 2017-10-24 NOTE — Progress Notes (Signed)
Location:  Occupational psychologist of Service:  ALF (13) Provider:  Cindi Carbon, ANP [place] [phone]  Gayland Curry, DO  Patient Care Team: Gayland Curry, DO as PCP - General (Geriatric Medicine) Justice Britain, MD as Consulting Physician (Orthopedic Surgery) Gaynelle Arabian, MD as Consulting Physician (Orthopedic Surgery) Garvin Fila, MD as Consulting Physician (Neurology) Evans Lance, MD as Consulting Physician (Cardiology) Jettie Booze, MD as Consulting Physician (Interventional Cardiology) Sueanne Margarita, MD as Consulting Physician (Cardiology) Shon Hough, MD as Consulting Physician (Ophthalmology)  Extended Emergency Contact Information Primary Emergency Contact: Metts,JED Address: [place], [place] Phone: [phone] Mobile Phone: [phone] Relation: Son Secondary Emergency Contact: [place] Mobile Phone: [phone] Relation: Daughter  Code Status:  DNR Goals of care: Advanced Directive information Advanced Directives 10/11/2017  Does Patient Have a Medical Advance Directive? Yes  Type of Paramedic of [place];Out of facility DNR (pink MOST or yellow form)  Does patient want to make changes to medical advance directive? No - Patient declined  Copy of [place] in Chart? Yes  Pre-existing out of facility DNR order (yellow form or pink MOST form) Yellow form placed in chart (order not valid for inpatient use)     Chief Complaint  Patient presents with  . Acute Visit    no improvement in cellulitis and itching    HPI:  Pt is a 84 y.o. female seen today for an acute visit for unresolved erythema to the right eye and vaginal area with associated itching. She was seen on 5/21 and diagnosed with periorbital cellulitis and as well as folliculitis to the groin area and yeast infection. She was treated  with keflex 500 mg BID. The dose was increased to 500 mg TID and the course was extended on 5/31 due to continued redness and itching. There were initially pustules to the vaginal area that have resolved but otherwise the area remains very pruritic and red. She has dementia and scratches the area repeatedly and touches her eyes as well. There is no change in vision, nasal symptoms, or purulent drainage. She has been afebrile.    Past Medical History:  Diagnosis Date  . Breast cancer ([place])   . Colon polyps   . Coronary artery disease 06/2006   STEMI inferior with BMS to mid RCA with mid basal inferior wall HK and luminal irrgularities elsewhere  . Fracture of femoral neck, right ([place]) 06/13/2011  . Fracture of humeral head, right, closed 06/13/2011  . GERD (gastroesophageal reflux disease)   . Hemorrhoids   . Hyperlipemia   . Hypertension   . LBBB (left bundle branch block)   . Macular degeneration   . Myocardial infarction ([place])   . Obstructive sleep apnea on CPAP   . Osteopenia   . Osteopenia   . Pacemaker 02/25/14  . Rhinitis   . Rotator cuff syndrome   . SAH (subarachnoid hemorrhage) ([place]) 02/22/14  . 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 Ulnar neuropathy   . Umbilical hernia   . Unstable gait    Past Surgical History:  Procedure Laterality Date  . BREAST LUMPECTOMY    . CATARACT EXTRACTION, BILATERAL     Per [facility]  . CORONARY ANGIOPLASTY WITH STENT PLACEMENT  06/2006   Mid RCA BMS  . HIP ARTHROPLASTY  06/06/2011   Procedure: ARTHROPLASTY BIPOLAR HIP;  Surgeon: Dione Plover Aluisio;  Location: [facility];  Service: Orthopedics;  Laterality: Right;  . PERMANENT PACEMAKER INSERTION N/A 02/25/2014   Procedure: PERMANENT PACEMAKER INSERTION;  Surgeon: Evans Lance, MD;  Location: [facility] CATH LAB;  Service: Cardiovascular;  Laterality: N/A;  . SHOULDER SURGERY    . TONSILLECTOMY     Per [facility]   . WRIST SURGERY      Allergies  Allergen Reactions  .  Chlorpheniramine Anaphylaxis  . Codeine Anaphylaxis  . Amlodipine     Per [facility]     Outpatient Encounter Medications as of 10/24/2017  Medication Sig  . cephALEXin (KEFLEX) 500 MG capsule Take 500 mg by mouth 3 (three) times daily.  . chlorhexidine (HIBICLENS) 4 % external liquid Apply 1 application topically 2 (two) times a week.  . DULoxetine (CYMBALTA) 60 MG capsule Take 60 mg by mouth daily.  Marland Kitchen aspirin EC 81 MG tablet Take 81 mg by mouth daily.  . Cholecalciferol (VITAMIN D3) 2000 UNITS TABS Take 3,000 Units by mouth daily.   . Coenzyme Q10 (COQ10) 200 MG CAPS Take 1 tablet by mouth daily.  . furosemide (LASIX) 40 MG tablet Take 1 tablet (40 mg total) by mouth daily.  . Hypromellose (ARTIFICIAL TEARS OP) Apply 1 drop to eye 2 (two) times daily as needed (dry eyes).  Marland Kitchen losartan (COZAAR) 100 MG tablet Take 100 mg by mouth daily.  . Multiple Vitamins-Minerals (PRESERVISION/LUTEIN) CAPS Take 1 capsule by mouth 2 (two) times daily.  . nebivolol (BYSTOLIC) 5 MG tablet Take 1 tablet (5 mg total) by mouth daily.  . nitroGLYCERIN (NITROSTAT) 0.4 MG SL tablet Place 1 tablet (0.4 mg total) under the tongue every 5 (five) minutes as needed for chest pain.  Marland Kitchen nystatin (NYSTATIN) powder Apply topically 2 (two) times daily.  . potassium chloride SA (K-DUR,KLOR-CON) 20 MEQ tablet Take 20 mEq by mouth daily.   Marland Kitchen triamcinolone (KENALOG) 0.025 % cream Apply 1 application topically 2 (two) times daily.   . vitamin B-12 (CYANOCOBALAMIN) 1000 MCG tablet Take 1,000 mcg by mouth daily.  . [DISCONTINUED] DULoxetine (CYMBALTA) 30 MG capsule Take 30 mg by mouth daily.    No facility-administered encounter medications on file as of 10/24/2017.     Review of Systems  Constitutional: Negative for activity change, appetite change, chills, diaphoresis, fatigue, fever and unexpected weight change.  HENT: Negative for congestion, ear discharge, ear pain, nosebleeds, postnasal drip, rhinorrhea, sore  throat and trouble swallowing.   Eyes: Positive for redness and itching. Negative for photophobia, pain, discharge and visual disturbance.  Respiratory: Negative for cough, shortness of breath and wheezing.   Cardiovascular: Negative for chest pain, palpitations and leg swelling.  Gastrointestinal: Negative for abdominal distention, abdominal pain, constipation and diarrhea.  Genitourinary: Positive for vaginal discharge (white). Negative for difficulty urinating, dysuria, flank pain and vaginal pain.       Incontinent  Musculoskeletal: Positive for gait problem. Negative for arthralgias, back pain, joint swelling and myalgias.  Skin: Positive for color change and rash (with itching to right eye and vaginal area). Negative for wound.  Neurological: Negative for dizziness, tremors, seizures, syncope, facial asymmetry, speech difficulty, weakness, light-headedness, numbness and headaches.  Psychiatric/Behavioral: Positive for confusion. Negative for agitation and behavioral problems.    Immunization History  Administered Date(s) Administered  . Influenza, High Dose Seasonal PF 02/18/2017  . Influenza-Unspecified 03/04/2016  . Pneumococcal Conjugate-13 05/08/2014  . Pneumococcal Polysaccharide-23 10/08/2017  . Td 07/09/2002, 01/16/2013  . Zoster 06/30/2010  Pertinent  Health Maintenance Due  Topic Date Due  . INFLUENZA VACCINE  12/22/2017  . DEXA SCAN  Completed  . PNA vac Low Risk Adult  Completed   Fall Risk  10/04/2017 09/28/2017 03/03/2014  Falls in the past year? No No Yes  Number falls in past yr: - - 1  Injury with Fall? - - Yes  Risk Factor Category  - - High Fall Risk  Risk for fall due to : - - History of fall(s);Impaired balance/gait   Functional Status Survey:    Vitals:   10/24/17 0951  BP: 110/72  Pulse: 66  Resp: 20  Temp: (!) 97.5 F (36.4 C)  SpO2: 97%   There is no height or weight on file to calculate BMI. Physical Exam  Constitutional: She is oriented  to person, place, and time. No distress.  HENT:  Head: Normocephalic and atraumatic.  Right Ear: External ear normal.  Left Ear: External ear normal.  Nose: Nose normal.  Mouth/Throat: Oropharynx is clear and moist. No oropharyngeal exudate.  Eyes: Pupils are equal, round, and reactive to light. Right eye exhibits no chemosis, no discharge and no exudate. Left eye exhibits no chemosis, no discharge and no exudate. Right conjunctiva is not injected. Right conjunctiva has no hemorrhage. Left conjunctiva is not injected. Left conjunctiva has no hemorrhage. Right eye exhibits normal extraocular motion. Left eye exhibits normal extraocular motion.    Cardiovascular: Normal rate.  No murmur heard. irregular  Pulmonary/Chest: Effort normal and breath sounds normal. No respiratory distress.  Genitourinary: There is rash (erythema to vaginal and rectal area without pustules) on the right labia. There is no tenderness, lesion or injury on the right labia. There is rash on the left labia. There is no tenderness, lesion or injury on the left labia. Vaginal discharge (white) found.  Neurological: She is alert and oriented to person, place, and time.  Forgetful of the details of her care. Pleasant and able to f/c.  Skin: She is not diaphoretic.    Labs reviewed: Recent Labs    07/27/17 1342 07/28/17 0700  NA 135* 136*  K 4.5 4.2  BUN 13 14  CREATININE 0.7 0.5   Recent Labs    07/27/17 1342  AST 17  ALT 9  ALKPHOS 106   Recent Labs    07/27/17 1342 07/28/17 0700  WBC 8.0 6.8  HGB 12.7 13.0  HCT 38 39  PLT 247 210   Lab Results  Component Value Date   TSH 4.05 07/28/2017   No results found for: HGBA1C Lab Results  Component Value Date   CHOL 214 (A) 07/27/2017   HDL 60 07/27/2017   LDLCALC 134 07/27/2017   TRIG 104 07/27/2017    Significant Diagnostic Results in last 30 days:  No results found.  Assessment/Plan  1. Periorbital cellulitis of right eye No improvement in  the right eye in regards to redness and swelling but no change in vision or drainage. It has not spread and she is afebrile. No adenopathy. Try Doxycycline 100 mg BID for 10 days with Florastor 1 cap BID for 10 days.  There is associated erythema to the vaginal area with healing of previous pustules. Only erythema and a small amt of white drainage remain.  She should cut her nails and the staff will clean her room and sheets. She will use hibiclens wash twice a week. It has been difficult to prevent her from scratching the area. Will order claritin 10 mg qd x  2 weeks.  Also florastor 1 cap BID x 10 days  2. Vaginal yeast infection Diflucan 150 mg x 1 dose Continue nystatin powder   Family/ staff Communication: staff/resident  Labs/tests ordered:  NA

## 2017-10-25 DIAGNOSIS — N3946 Mixed incontinence: Secondary | ICD-10-CM | POA: Diagnosis not present

## 2017-10-25 DIAGNOSIS — R41844 Frontal lobe and executive function deficit: Secondary | ICD-10-CM | POA: Diagnosis not present

## 2017-10-25 DIAGNOSIS — R41840 Attention and concentration deficit: Secondary | ICD-10-CM | POA: Diagnosis not present

## 2017-10-25 DIAGNOSIS — R413 Other amnesia: Secondary | ICD-10-CM | POA: Diagnosis not present

## 2017-10-25 DIAGNOSIS — R350 Frequency of micturition: Secondary | ICD-10-CM | POA: Diagnosis not present

## 2017-10-25 DIAGNOSIS — R3989 Other symptoms and signs involving the genitourinary system: Secondary | ICD-10-CM | POA: Diagnosis not present

## 2017-10-26 ENCOUNTER — Encounter: Payer: Self-pay | Admitting: Internal Medicine

## 2017-10-26 DIAGNOSIS — R41844 Frontal lobe and executive function deficit: Secondary | ICD-10-CM | POA: Diagnosis not present

## 2017-10-26 DIAGNOSIS — N3946 Mixed incontinence: Secondary | ICD-10-CM | POA: Diagnosis not present

## 2017-10-26 DIAGNOSIS — E119 Type 2 diabetes mellitus without complications: Secondary | ICD-10-CM | POA: Diagnosis not present

## 2017-10-26 DIAGNOSIS — E039 Hypothyroidism, unspecified: Secondary | ICD-10-CM | POA: Diagnosis not present

## 2017-10-26 DIAGNOSIS — D649 Anemia, unspecified: Secondary | ICD-10-CM | POA: Diagnosis not present

## 2017-10-26 DIAGNOSIS — R41840 Attention and concentration deficit: Secondary | ICD-10-CM | POA: Diagnosis not present

## 2017-10-26 DIAGNOSIS — L0390 Cellulitis, unspecified: Secondary | ICD-10-CM | POA: Diagnosis not present

## 2017-10-26 DIAGNOSIS — R350 Frequency of micturition: Secondary | ICD-10-CM | POA: Diagnosis not present

## 2017-10-26 DIAGNOSIS — R413 Other amnesia: Secondary | ICD-10-CM | POA: Diagnosis not present

## 2017-10-26 DIAGNOSIS — R3989 Other symptoms and signs involving the genitourinary system: Secondary | ICD-10-CM | POA: Diagnosis not present

## 2017-11-01 ENCOUNTER — Telehealth: Payer: Self-pay | Admitting: Internal Medicine

## 2017-11-01 DIAGNOSIS — R41844 Frontal lobe and executive function deficit: Secondary | ICD-10-CM | POA: Diagnosis not present

## 2017-11-01 DIAGNOSIS — R41840 Attention and concentration deficit: Secondary | ICD-10-CM | POA: Diagnosis not present

## 2017-11-01 DIAGNOSIS — R350 Frequency of micturition: Secondary | ICD-10-CM | POA: Diagnosis not present

## 2017-11-01 DIAGNOSIS — R413 Other amnesia: Secondary | ICD-10-CM | POA: Diagnosis not present

## 2017-11-01 DIAGNOSIS — N3946 Mixed incontinence: Secondary | ICD-10-CM | POA: Diagnosis not present

## 2017-11-01 DIAGNOSIS — R3989 Other symptoms and signs involving the genitourinary system: Secondary | ICD-10-CM | POA: Diagnosis not present

## 2017-11-01 NOTE — Telephone Encounter (Signed)
Returned call to son. Per son Pt has declined in health and is now on [facility] assisted living floor. Son inquiring if pacemaker check is necessary d/t difficulty with Pt mobility. Advised per Dr.Taylor- ok to follow Pt remotely.  Will only have Pt come in if remote monitoring indicates a problem.  Need to reestablish remote monitoring.

## 2017-11-01 NOTE — Telephone Encounter (Signed)
New Message    Mr Brusseau would like you to call him,  She is not having a problem, but this is all the information he would give about the patient

## 2017-11-02 ENCOUNTER — Ambulatory Visit (INDEPENDENT_AMBULATORY_CARE_PROVIDER_SITE_OTHER): Payer: Medicare Other | Admitting: *Deleted

## 2017-11-02 DIAGNOSIS — I4430 Unspecified atrioventricular block: Secondary | ICD-10-CM | POA: Diagnosis not present

## 2017-11-02 DIAGNOSIS — R41844 Frontal lobe and executive function deficit: Secondary | ICD-10-CM | POA: Diagnosis not present

## 2017-11-02 DIAGNOSIS — R350 Frequency of micturition: Secondary | ICD-10-CM | POA: Diagnosis not present

## 2017-11-02 DIAGNOSIS — R41840 Attention and concentration deficit: Secondary | ICD-10-CM | POA: Diagnosis not present

## 2017-11-02 DIAGNOSIS — N3946 Mixed incontinence: Secondary | ICD-10-CM | POA: Diagnosis not present

## 2017-11-02 DIAGNOSIS — R413 Other amnesia: Secondary | ICD-10-CM | POA: Diagnosis not present

## 2017-11-02 DIAGNOSIS — R3989 Other symptoms and signs involving the genitourinary system: Secondary | ICD-10-CM | POA: Diagnosis not present

## 2017-11-02 NOTE — Telephone Encounter (Signed)
Spoke wit pts son informed him that transmission was received and that pacemaker function was normal, pt had 9-10 years left on battery and that next remote transmission is scheduled for 02/01/2018, pts son voiced understanding also informed pts son that I would cancel apt with Dr. Lovena Le since he was ok with pt just transmitting remotely unless issues arise, pts son voiced understanding.

## 2017-11-02 NOTE — Telephone Encounter (Signed)
Spoke with son and informed him that home monitor was updated and that all they would need to do was press the large white button twice and make sure pt is near monitor to send transmission pt son stated that he would go over to [facility] now and call back to make sure that transmission was successful direct number to device clinic given for him to call back.

## 2017-11-02 NOTE — Progress Notes (Signed)
Remote pacemaker transmission.   

## 2017-11-03 ENCOUNTER — Encounter: Payer: Self-pay | Admitting: Cardiology

## 2017-11-03 ENCOUNTER — Encounter: Payer: Medicare Other | Admitting: Internal Medicine

## 2017-11-04 DIAGNOSIS — R350 Frequency of micturition: Secondary | ICD-10-CM | POA: Diagnosis not present

## 2017-11-04 DIAGNOSIS — R41840 Attention and concentration deficit: Secondary | ICD-10-CM | POA: Diagnosis not present

## 2017-11-04 DIAGNOSIS — R3989 Other symptoms and signs involving the genitourinary system: Secondary | ICD-10-CM | POA: Diagnosis not present

## 2017-11-04 DIAGNOSIS — R41844 Frontal lobe and executive function deficit: Secondary | ICD-10-CM | POA: Diagnosis not present

## 2017-11-04 DIAGNOSIS — N3946 Mixed incontinence: Secondary | ICD-10-CM | POA: Diagnosis not present

## 2017-11-04 DIAGNOSIS — R413 Other amnesia: Secondary | ICD-10-CM | POA: Diagnosis not present

## 2017-11-07 DIAGNOSIS — N3946 Mixed incontinence: Secondary | ICD-10-CM | POA: Diagnosis not present

## 2017-11-07 DIAGNOSIS — R3989 Other symptoms and signs involving the genitourinary system: Secondary | ICD-10-CM | POA: Diagnosis not present

## 2017-11-07 DIAGNOSIS — R41844 Frontal lobe and executive function deficit: Secondary | ICD-10-CM | POA: Diagnosis not present

## 2017-11-07 DIAGNOSIS — R413 Other amnesia: Secondary | ICD-10-CM | POA: Diagnosis not present

## 2017-11-07 DIAGNOSIS — R41840 Attention and concentration deficit: Secondary | ICD-10-CM | POA: Diagnosis not present

## 2017-11-07 DIAGNOSIS — R350 Frequency of micturition: Secondary | ICD-10-CM | POA: Diagnosis not present

## 2017-11-08 DIAGNOSIS — R350 Frequency of micturition: Secondary | ICD-10-CM | POA: Diagnosis not present

## 2017-11-08 DIAGNOSIS — R41840 Attention and concentration deficit: Secondary | ICD-10-CM | POA: Diagnosis not present

## 2017-11-08 DIAGNOSIS — R413 Other amnesia: Secondary | ICD-10-CM | POA: Diagnosis not present

## 2017-11-08 DIAGNOSIS — R3989 Other symptoms and signs involving the genitourinary system: Secondary | ICD-10-CM | POA: Diagnosis not present

## 2017-11-08 DIAGNOSIS — N3946 Mixed incontinence: Secondary | ICD-10-CM | POA: Diagnosis not present

## 2017-11-08 DIAGNOSIS — R41844 Frontal lobe and executive function deficit: Secondary | ICD-10-CM | POA: Diagnosis not present

## 2017-11-09 ENCOUNTER — Encounter: Payer: Self-pay | Admitting: Internal Medicine

## 2017-11-09 ENCOUNTER — Non-Acute Institutional Stay: Payer: Medicare Other | Admitting: Internal Medicine

## 2017-11-09 VITALS — BP 150/80 | HR 63 | Temp 98.5°F | Ht 66.0 in | Wt 222.0 lb

## 2017-11-09 DIAGNOSIS — F329 Major depressive disorder, single episode, unspecified: Secondary | ICD-10-CM

## 2017-11-09 DIAGNOSIS — L739 Follicular disorder, unspecified: Secondary | ICD-10-CM | POA: Diagnosis not present

## 2017-11-09 DIAGNOSIS — F0150 Vascular dementia without behavioral disturbance: Secondary | ICD-10-CM

## 2017-11-09 DIAGNOSIS — I6523 Occlusion and stenosis of bilateral carotid arteries: Secondary | ICD-10-CM | POA: Diagnosis not present

## 2017-11-09 DIAGNOSIS — R3981 Functional urinary incontinence: Secondary | ICD-10-CM | POA: Diagnosis not present

## 2017-11-09 NOTE — Progress Notes (Signed)
Location:  Occupational psychologist of Service:  Clinic (12)  Provider: Jaretzi Droz L. Mariea Clonts, D.O., C.M.D.  Code Status: DNR Goals of Care:  Advanced Directives 11/09/2017  Does Patient Have a Medical Advance Directive? Yes  Type of Paramedic of [place];Out of facility DNR (pink MOST or yellow form)  Does patient want to make changes to medical advance directive? No - Patient declined  Copy of [place] in Chart? Yes  Pre-existing out of facility DNR order (yellow form or pink MOST form) Yellow form placed in chart (order not valid for inpatient use)   Chief Complaint  Patient presents with  . Medical Management of Chronic Issues    6 week follow-up, itching in vagina    HPI: Patient is a 84 y.o. female seen today for 6 week f/u on depression and her vaginal itching and periorbital cellulitis.     She did got to see a Social worker at the [facility] center.  She is on cymbalta 60mg  dial for her mood.  She spoke to me at length (an hour) about the multiple reasons she is depressed and how it's a challenge to adjust to changes in life with aging.    She was initially treated with keflex and the doxycycline for her periorbital cellultiis which came after she was itching the rash in her groin and perineum and then touching her right eye.  The perioribital cellulitis is better.  She still has tremendous vaginal itching and bumps.    Past Medical History:  Diagnosis Date  . Breast cancer ([place])   . Colon polyps   . Coronary artery disease 06/2006   STEMI inferior with BMS to mid RCA with mid basal inferior wall HK and luminal irrgularities elsewhere  . Fracture of femoral neck, right ([place]) 06/13/2011  . Fracture of humeral head, right, closed 06/13/2011  . GERD (gastroesophageal reflux disease)   . Hemorrhoids   . Hyperlipemia   . Hypertension   . LBBB (left bundle branch block)   . Macular degeneration   . Myocardial  infarction ([place])   . Obstructive sleep apnea on CPAP   . Osteopenia   . Osteopenia   . Pacemaker 02/25/14  . Rhinitis   . Rotator cuff syndrome   . SAH (subarachnoid hemorrhage) ([place]) 02/22/14  . 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 Ulnar neuropathy   . Umbilical hernia   . Unstable gait     Past Surgical History:  Procedure Laterality Date  . BREAST LUMPECTOMY    . CATARACT EXTRACTION, BILATERAL     Per [facility]  . CORONARY ANGIOPLASTY WITH STENT PLACEMENT  06/2006   Mid RCA BMS  . HIP ARTHROPLASTY  06/06/2011   Procedure: ARTHROPLASTY BIPOLAR HIP;  Surgeon: Gearlean Alf;  [facility]: [facility];  Service: Orthopedics;  Laterality: Right;  . PERMANENT PACEMAKER INSERTION N/A 02/25/2014   Procedure: PERMANENT PACEMAKER INSERTION;  Surgeon: Evans Lance, MD;  Location: [facility] CATH LAB;  Service: Cardiovascular;  Laterality: N/A;  . SHOULDER SURGERY    . TONSILLECTOMY     Per [facility]   . WRIST SURGERY      Allergies  Allergen Reactions  . Chlorpheniramine Anaphylaxis  . Codeine Anaphylaxis  . Amlodipine     Per [facility]     Outpatient Encounter Medications as of 11/09/2017  Medication Sig  . aspirin EC 81 MG tablet Take 81 mg by mouth daily.  . chlorhexidine (  HIBICLENS) 4 % external liquid Apply 1 application topically 2 (two) times a week.  . Cholecalciferol (VITAMIN D3) 2000 UNITS TABS Take 3,000 Units by mouth daily.   . Coenzyme Q10 (COQ10) 200 MG CAPS Take 1 tablet by mouth daily.  Marland Kitchen donepezil (ARICEPT) 5 MG tablet Take 5 mg by mouth at bedtime.  . DULoxetine (CYMBALTA) 60 MG capsule Take 60 mg by mouth daily.  . furosemide (LASIX) 40 MG tablet Take 1 tablet (40 mg total) by mouth daily.  . Hypromellose (ARTIFICIAL TEARS OP) Apply 1 drop to eye 2 (two) times daily as needed (dry eyes).  Marland Kitchen levothyroxine (SYNTHROID, LEVOTHROID) 25 MCG tablet Take 25 mcg by mouth daily before breakfast.   . losartan (COZAAR) 100 MG tablet Take 100 mg by  mouth daily.  . Multiple Vitamins-Minerals (PRESERVISION/LUTEIN) CAPS Take 1 capsule by mouth 2 (two) times daily.  . nebivolol (BYSTOLIC) 5 MG tablet Take 1 tablet (5 mg total) by mouth daily.  . nitroGLYCERIN (NITROSTAT) 0.4 MG SL tablet Place 1 tablet (0.4 mg total) under the tongue every 5 (five) minutes as needed for chest pain.  Marland Kitchen nystatin (NYSTATIN) powder Apply topically 2 (two) times daily.  . potassium chloride SA (K-DUR,KLOR-CON) 20 MEQ tablet Take 20 mEq by mouth daily.   Marland Kitchen triamcinolone (KENALOG) 0.025 % cream Apply 1 application topically 2 (two) times daily.   . vitamin B-12 (CYANOCOBALAMIN) 1000 MCG tablet Take 1,000 mcg by mouth daily.  . [DISCONTINUED] cephALEXin (KEFLEX) 500 MG capsule Take 500 mg by mouth 3 (three) times daily.   No facility-administered encounter medications on file as of 11/09/2017.     Review of Systems:  Review of Systems  Constitutional: Negative for chills, fever, malaise/fatigue and weight loss.  HENT: Positive for hearing loss.   Eyes: Negative for blurred vision.       Reading glasses  Respiratory: Negative for cough and shortness of breath.   Cardiovascular: Positive for leg swelling. Negative for chest pain and palpitations.  Gastrointestinal: Negative for abdominal pain, blood in stool, constipation, diarrhea and melena.  Genitourinary: Positive for frequency and urgency. Negative for dysuria.       Not painful now but itchy  Musculoskeletal: Negative for falls and joint pain.  Skin: Positive for itching and rash.  Neurological: Negative for dizziness and loss of consciousness.  Endo/Heme/Allergies: Bruises/bleeds easily.  Psychiatric/Behavioral: Positive for depression and memory loss. The patient is not nervous/anxious and does not have insomnia.     Health Maintenance  Topic Date Due  . INFLUENZA VACCINE  12/22/2017  . TETANUS/TDAP  01/17/2023  . DEXA SCAN  Completed  . PNA vac Low Risk Adult  Completed    Physical  Exam: Vitals:   11/09/17 1525  BP: (!) 150/80  Pulse: 63  Temp: 98.5 F (36.9 C)  TempSrc: Oral  SpO2: 95%  Weight: 222 lb (100.7 kg)  Height: 5\' 6"  (1.676 m)   Body mass index is 35.83 kg/m. Physical Exam  Constitutional: She appears well-developed and well-nourished. No distress.  Cardiovascular: Normal rate, regular rhythm, normal heart sounds and intact distal pulses.  Pulmonary/Chest: Effort normal and breath sounds normal. No respiratory distress.  Abdominal: Soft. Bowel sounds are normal. She exhibits no distension. There is no tenderness.  Musculoskeletal:  Walks with walker  Neurological: She is alert.  Oriented to person, place, not date or time, repeats herself often  Skin: Skin is warm and dry. No erythema.  Erythema in groin and perineal area resolved, no longer  tender on exam; however, cysts remain (appear to be inflamed bartholin's cysts)  Psychiatric: She has a normal mood and affect.  Very pleasant, talkative    Labs reviewed: Basic Metabolic Panel: Recent Labs    07/27/17 07/27/17 1342 07/28/17 0700  NA  --  135* 136*  K  --  4.5 4.2  BUN  --  13 14  CREATININE  --  0.7 0.5  TSH 4.05 2.84 4.05   Liver Function Tests: Recent Labs    07/27/17 1342  AST 17  ALT 9  ALKPHOS 106   No results for input(s): LIPASE, AMYLASE in the last 8760 hours. No results for input(s): AMMONIA in the last 8760 hours. CBC: Recent Labs    07/27/17 1342 07/28/17 0700  WBC 8.0 6.8  HGB 12.7 13.0  HCT 38 39  PLT 247 210   Lipid Panel: Recent Labs    07/27/17 1342  CHOL 214*  HDL 60  LDLCALC 134  TRIG 104    Assessment/Plan 1. Reactive depression -ongoing, Katy Fitch will reach out to her for counseling occasionally, but challenging b/c patient does not recall advice provided due to her memory loss -cont current cymbalta that was started by her memory care specialist in [place]  2. Vascular dementia without behavioral disturbance -has continued  to progress since move, is declining further functionally, requires a lot of cues and written instruction to remember proper hygiene, pad changes, etc.  -ideally needs memory care unit support  3. Folliculitis of perineum -infection seems considerably better, but still has inflamed Batholin's glands, it appears -will tx with clotrimazole cream to see if these will rupture and resolve  4. Functional urinary incontinence -ongoing, needs more CNA assistance than provided in AL  Labs/tests ordered:  No new Next appt:  02/08/2018  Kuzey Ogata L. Lowry Bala, D.O. [facility] [address], [place] [address] Cell Phone (Mon-Fri 8am-5pm):  [phone] On Call:  [phone] & follow prompts after 5pm & weekends Office Phone:  [phone] Office Fax:  [phone]

## 2017-11-24 LAB — CUP PACEART REMOTE DEVICE CHECK
Battery Remaining Longevity: 121 mo
Battery Remaining Percentage: 95.5 %
Battery Voltage: 2.99 V
Brady Statistic AP VS Percent: 1 %
Brady Statistic AS VS Percent: 1.2 %
Brady Statistic RV Percent Paced: 92 %
Date Time Interrogation Session: 20190612130816
Implantable Lead Implant Date: 20151005
Implantable Lead Location: 753859
Lead Channel Impedance Value: 460 Ohm
Lead Channel Pacing Threshold Amplitude: 0.625 V
Lead Channel Pacing Threshold Pulse Width: 0.5 ms
Lead Channel Sensing Intrinsic Amplitude: 5 mV
Lead Channel Sensing Intrinsic Amplitude: 6.7 mV
Lead Channel Setting Pacing Amplitude: 0.875
Lead Channel Setting Pacing Pulse Width: 0.5 ms
MDC IDC LEAD IMPLANT DT: 20151005
MDC IDC LEAD LOCATION: 753860
MDC IDC MSMT LEADCHNL RA PACING THRESHOLD AMPLITUDE: 1 V
MDC IDC MSMT LEADCHNL RA PACING THRESHOLD PULSEWIDTH: 0.5 ms
MDC IDC MSMT LEADCHNL RV IMPEDANCE VALUE: 600 Ohm
MDC IDC PG IMPLANT DT: 20151005
MDC IDC PG SERIAL: 7666288
MDC IDC SET LEADCHNL RA PACING AMPLITUDE: 2 V
MDC IDC SET LEADCHNL RV SENSING SENSITIVITY: 4 mV
MDC IDC STAT BRADY AP VP PERCENT: 70 %
MDC IDC STAT BRADY AS VP PERCENT: 22 %
MDC IDC STAT BRADY RA PERCENT PACED: 64 %

## 2017-11-28 DIAGNOSIS — N762 Acute vulvitis: Secondary | ICD-10-CM | POA: Diagnosis not present

## 2017-12-09 DIAGNOSIS — E039 Hypothyroidism, unspecified: Secondary | ICD-10-CM | POA: Diagnosis not present

## 2017-12-09 DIAGNOSIS — E038 Other specified hypothyroidism: Secondary | ICD-10-CM | POA: Diagnosis not present

## 2017-12-09 LAB — TSH: TSH: 3.44 (ref 0.41–5.90)

## 2017-12-15 ENCOUNTER — Encounter: Payer: Self-pay | Admitting: Internal Medicine

## 2017-12-15 DIAGNOSIS — F0150 Vascular dementia without behavioral disturbance: Secondary | ICD-10-CM | POA: Diagnosis not present

## 2017-12-15 DIAGNOSIS — R41841 Cognitive communication deficit: Secondary | ICD-10-CM | POA: Diagnosis not present

## 2017-12-16 DIAGNOSIS — R41841 Cognitive communication deficit: Secondary | ICD-10-CM | POA: Diagnosis not present

## 2017-12-16 DIAGNOSIS — F0150 Vascular dementia without behavioral disturbance: Secondary | ICD-10-CM | POA: Diagnosis not present

## 2017-12-20 DIAGNOSIS — F0150 Vascular dementia without behavioral disturbance: Secondary | ICD-10-CM | POA: Diagnosis not present

## 2017-12-20 DIAGNOSIS — R41841 Cognitive communication deficit: Secondary | ICD-10-CM | POA: Diagnosis not present

## 2017-12-21 DIAGNOSIS — R41841 Cognitive communication deficit: Secondary | ICD-10-CM | POA: Diagnosis not present

## 2017-12-21 DIAGNOSIS — F0150 Vascular dementia without behavioral disturbance: Secondary | ICD-10-CM | POA: Diagnosis not present

## 2017-12-22 DIAGNOSIS — F0150 Vascular dementia without behavioral disturbance: Secondary | ICD-10-CM | POA: Diagnosis not present

## 2017-12-22 DIAGNOSIS — R41841 Cognitive communication deficit: Secondary | ICD-10-CM | POA: Diagnosis not present

## 2017-12-29 DIAGNOSIS — F0150 Vascular dementia without behavioral disturbance: Secondary | ICD-10-CM | POA: Diagnosis not present

## 2017-12-29 DIAGNOSIS — R41841 Cognitive communication deficit: Secondary | ICD-10-CM | POA: Diagnosis not present

## 2018-01-03 DIAGNOSIS — F0150 Vascular dementia without behavioral disturbance: Secondary | ICD-10-CM | POA: Diagnosis not present

## 2018-01-03 DIAGNOSIS — R41841 Cognitive communication deficit: Secondary | ICD-10-CM | POA: Diagnosis not present

## 2018-01-04 DIAGNOSIS — F0150 Vascular dementia without behavioral disturbance: Secondary | ICD-10-CM | POA: Diagnosis not present

## 2018-01-04 DIAGNOSIS — R41841 Cognitive communication deficit: Secondary | ICD-10-CM | POA: Diagnosis not present

## 2018-01-10 DIAGNOSIS — F0150 Vascular dementia without behavioral disturbance: Secondary | ICD-10-CM | POA: Diagnosis not present

## 2018-01-10 DIAGNOSIS — R41841 Cognitive communication deficit: Secondary | ICD-10-CM | POA: Diagnosis not present

## 2018-01-17 DIAGNOSIS — B49 Unspecified mycosis: Secondary | ICD-10-CM | POA: Diagnosis not present

## 2018-01-17 DIAGNOSIS — Z955 Presence of coronary angioplasty implant and graft: Secondary | ICD-10-CM | POA: Diagnosis not present

## 2018-01-17 DIAGNOSIS — Z5320 Procedure and treatment not carried out because of patient's decision for unspecified reasons: Secondary | ICD-10-CM | POA: Diagnosis not present

## 2018-01-17 DIAGNOSIS — F0150 Vascular dementia without behavioral disturbance: Secondary | ICD-10-CM | POA: Diagnosis not present

## 2018-01-17 DIAGNOSIS — F320 Major depressive disorder, single episode, mild: Secondary | ICD-10-CM | POA: Diagnosis not present

## 2018-01-17 DIAGNOSIS — R0989 Other specified symptoms and signs involving the circulatory and respiratory systems: Secondary | ICD-10-CM | POA: Diagnosis not present

## 2018-01-17 DIAGNOSIS — R2681 Unsteadiness on feet: Secondary | ICD-10-CM | POA: Diagnosis not present

## 2018-01-20 ENCOUNTER — Encounter: Payer: Self-pay | Admitting: Internal Medicine

## 2018-01-20 DIAGNOSIS — D649 Anemia, unspecified: Secondary | ICD-10-CM | POA: Diagnosis not present

## 2018-01-20 DIAGNOSIS — Z79899 Other long term (current) drug therapy: Secondary | ICD-10-CM | POA: Diagnosis not present

## 2018-01-20 DIAGNOSIS — E119 Type 2 diabetes mellitus without complications: Secondary | ICD-10-CM | POA: Diagnosis not present

## 2018-01-20 DIAGNOSIS — L03115 Cellulitis of right lower limb: Secondary | ICD-10-CM | POA: Diagnosis not present

## 2018-01-25 DIAGNOSIS — L0390 Cellulitis, unspecified: Secondary | ICD-10-CM | POA: Diagnosis not present

## 2018-01-25 DIAGNOSIS — D649 Anemia, unspecified: Secondary | ICD-10-CM | POA: Diagnosis not present

## 2018-01-25 LAB — CBC AND DIFFERENTIAL
HCT: 36 (ref 36–46)
Hemoglobin: 12 (ref 12.0–16.0)
Neutrophils Absolute: 4
Platelets: 219 (ref 150–399)
WBC: 7.4

## 2018-01-25 LAB — BASIC METABOLIC PANEL
BUN: 12 (ref 4–21)
CREATININE: 0.6 (ref 0.5–1.1)
Glucose: 104
POTASSIUM: 4.4 (ref 3.4–5.3)
SODIUM: 139 (ref 137–147)

## 2018-01-26 ENCOUNTER — Encounter: Payer: Self-pay | Admitting: Adult Health

## 2018-01-26 ENCOUNTER — Non-Acute Institutional Stay: Payer: Medicare Other | Admitting: Adult Health

## 2018-01-26 DIAGNOSIS — R600 Localized edema: Secondary | ICD-10-CM | POA: Diagnosis not present

## 2018-01-26 DIAGNOSIS — L03116 Cellulitis of left lower limb: Secondary | ICD-10-CM | POA: Diagnosis not present

## 2018-01-26 DIAGNOSIS — L03115 Cellulitis of right lower limb: Secondary | ICD-10-CM

## 2018-01-26 NOTE — Progress Notes (Signed)
Location:  Occupational psychologist of Service:  ALF (13) Provider:   Cindi Carbon, ANP [place] [phone]   Gayland Curry, DO  Patient Care Team: Gayland Curry, DO as PCP - General (Geriatric Medicine) Justice Britain, MD as Consulting Physician (Orthopedic Surgery) Gaynelle Arabian, MD as Consulting Physician (Orthopedic Surgery) Garvin Fila, MD as Consulting Physician (Neurology) Evans Lance, MD as Consulting Physician (Cardiology) Jettie Booze, MD as Consulting Physician (Interventional Cardiology) Sueanne Margarita, MD as Consulting Physician (Cardiology) Shon Hough, MD as Consulting Physician (Ophthalmology)  Extended Emergency Contact Information Primary Emergency Contact: Pecor,JED Address: [address], [place] Phone: [phone] Mobile Phone: [phone] Relation: Son Secondary Emergency Contact: Alsey Mobile Phone: [phone] Relation: Daughter  Code Status:  DNR Goals of care: Advanced Directive information Advanced Directives 11/09/2017  Does Patient Have a Medical Advance Directive? Yes  Type of Paramedic of [place];Out of facility DNR (pink MOST or yellow form)  Does patient want to make changes to medical advance directive? No - Patient declined  Copy of [place] in Chart? Yes  Pre-existing out of facility DNR order (yellow form or pink MOST form) Yellow form placed in chart (order not valid for inpatient use)     Chief Complaint  Patient presents with  . Acute Visit    f/u cellulitis    HPI:  Pt is a 84 y.o. female seen today for an acute visit for cellulitis of the lower legs.   Started on Ancef 1 gram IV q 8 hrs on 01/24/18 and has received 5 doses. Previously on oral doxycycline with failed treatment associated with worsening redness and edema. Staff report her legs are slightly less  swollen but still very red. No fever, purulent drainage, pain, or change in mental status.   She has had a recent history of recurrent skin infections including periorbital cellulitis and groin cellulitis/folliculitis. She also had had recurrent fungal infection to her legs complicated by a skin reaction to nystatin. Her daughter is concerned about her recurrent infections and is requesting an ID evaluation. We have had the housekeeping staff clean the room/surfaces and she is using chlorhexidine wash twice weekly. Her groin area is much improved.    CBC and A1C checked and WNL  Past Medical History:  Diagnosis Date  . Breast cancer ([place])   . Colon polyps   . Coronary artery disease 06/2006   STEMI inferior with BMS to mid RCA with mid basal inferior wall HK and luminal irrgularities elsewhere  . Fracture of femoral neck, right ([place]) 06/13/2011  . Fracture of humeral head, right, closed 06/13/2011  . GERD (gastroesophageal reflux disease)   . Hemorrhoids   . Hyperlipemia   . Hypertension   . LBBB (left bundle branch block)   . Macular degeneration   . Myocardial infarction ([place])   . Obstructive sleep apnea on CPAP   . Osteopenia   . Osteopenia   . Pacemaker 02/25/14  . Rhinitis   . Rotator cuff syndrome   . SAH (subarachnoid hemorrhage) ([place]) 02/22/14  . 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 Ulnar neuropathy   . Umbilical hernia   . Unstable gait    Past Surgical History:  Procedure Laterality Date  . BREAST LUMPECTOMY    . CATARACT EXTRACTION, BILATERAL     Per [facility]  . CORONARY ANGIOPLASTY  WITH STENT PLACEMENT  06/2006   Mid RCA BMS  . HIP ARTHROPLASTY  06/06/2011   Procedure: ARTHROPLASTY BIPOLAR HIP;  Surgeon: Gearlean Alf;  Location: [facility] ORS;  Service: Orthopedics;  Laterality: Right;  . PERMANENT PACEMAKER INSERTION N/A 02/25/2014   Procedure: PERMANENT PACEMAKER INSERTION;  Surgeon: Evans Lance, MD;  Location: [facility] CATH LAB;  Service: Cardiovascular;   Laterality: N/A;  . SHOULDER SURGERY    . TONSILLECTOMY     Per [facility]   . WRIST SURGERY      Allergies  Allergen Reactions  . Chlorpheniramine Anaphylaxis  . Codeine Anaphylaxis  . Amlodipine     Per [facility]   . Nystatin     redness    Outpatient Encounter Medications as of 01/26/2018  Medication Sig  . aspirin EC 81 MG tablet Take 81 mg by mouth daily.  Marland Kitchen ceFAZolin (ANCEF) IVPB Inject 1 g into the vein every 8 (eight) hours.  Marland Kitchen ceFAZolin 1 g in sodium chloride 0.9 % 1,000 mL by Other route once.  . chlorhexidine (HIBICLENS) 4 % external liquid Apply 1 application topically 2 (two) times a week.  . Cholecalciferol (VITAMIN D3) 2000 UNITS TABS Take 3,000 Units by mouth daily.   . clotrimazole (LOTRIMIN) 1 % cream Apply 1 application topically 2 (two) times daily.  . Coenzyme Q10 (COQ10) 200 MG CAPS Take 1 tablet by mouth daily.  Marland Kitchen donepezil (ARICEPT) 5 MG tablet Take 5 mg by mouth at bedtime.  . DULoxetine (CYMBALTA) 60 MG capsule Take 60 mg by mouth daily.  . furosemide (LASIX) 40 MG tablet Take 1 tablet (40 mg total) by mouth daily.  . Hypromellose (ARTIFICIAL TEARS OP) Apply 1 drop to eye 2 (two) times daily as needed (dry eyes).  Marland Kitchen levothyroxine (SYNTHROID, LEVOTHROID) 25 MCG tablet Take 25 mcg by mouth daily before breakfast.   . losartan (COZAAR) 100 MG tablet Take 100 mg by mouth daily.  . Multiple Vitamins-Minerals (PRESERVISION/LUTEIN) CAPS Take 1 capsule by mouth 2 (two) times daily.  . nebivolol (BYSTOLIC) 5 MG tablet Take 1 tablet (5 mg total) by mouth daily.  . nitroGLYCERIN (NITROSTAT) 0.4 MG SL tablet Place 1 tablet (0.4 mg total) under the tongue every 5 (five) minutes as needed for chest pain.  . potassium chloride SA (K-DUR,KLOR-CON) 20 MEQ tablet Take 20 mEq by mouth daily.   Marland Kitchen saccharomyces boulardii (FLORASTOR) 250 MG capsule Take 250 mg by mouth 2 (two) times daily.  . vitamin B-12 (CYANOCOBALAMIN) 1000 MCG tablet Take 1,000  mcg by mouth daily.  . [DISCONTINUED] nystatin (NYSTATIN) powder Apply topically 2 (two) times daily.  . [DISCONTINUED] triamcinolone (KENALOG) 0.025 % cream Apply 1 application topically 2 (two) times daily.    No facility-administered encounter medications on file as of 01/26/2018.     Review of Systems  Constitutional: Negative for activity change, appetite change, chills, diaphoresis, fatigue, fever and unexpected weight change.  HENT: Negative for congestion.   Respiratory: Negative for cough, shortness of breath and wheezing.   Cardiovascular: Positive for leg swelling. Negative for chest pain and palpitations.  Gastrointestinal: Negative for abdominal distention, abdominal pain, constipation and diarrhea.  Genitourinary: Negative for difficulty urinating and dysuria.  Musculoskeletal: Positive for gait problem. Negative for arthralgias, back pain, joint swelling and myalgias.  Skin: Positive for color change and rash. Negative for wound.  Neurological: Negative for dizziness, tremors, seizures, syncope, facial asymmetry, speech difficulty, weakness, light-headedness, numbness and headaches.  Psychiatric/Behavioral: Positive for confusion.  Negative for agitation and behavioral problems.    Immunization History  Administered Date(s) Administered  . Influenza, High Dose Seasonal PF 02/18/2017  . Influenza-Unspecified 03/04/2016  . Pneumococcal Conjugate-13 05/08/2014  . Pneumococcal Polysaccharide-23 10/08/2017  . Td 07/09/2002, 01/16/2013  . Zoster 06/30/2010   Pertinent  Health Maintenance Due  Topic Date Due  . INFLUENZA VACCINE  12/22/2017  . DEXA SCAN  Completed  . PNA vac Low Risk Adult  Completed   Fall Risk  11/09/2017 10/04/2017 09/28/2017 03/03/2014  Falls in the past year? No No No Yes  Number falls in past yr: - - - 1  Injury with Fall? - - - Yes  Risk Factor Category  - - - High Fall Risk  Risk for fall due to : - - - History of fall(s);Impaired balance/gait    Functional Status Survey:    Vitals:   01/26/18 1203  BP: (!) 120/58  Pulse: 75  Resp: (!) 22  Temp: 97.9 F (36.6 C)  SpO2: 98%   There is no height or weight on file to calculate BMI. Physical Exam  Constitutional: She is oriented to person, place, and time. No distress.  HENT:  Head: Normocephalic and atraumatic.  Neck: No JVD present.  Cardiovascular: Normal rate and regular rhythm.  No murmur heard. Pulmonary/Chest: Effort normal and breath sounds normal. No respiratory distress. She has no wheezes.  Abdominal: Soft. Bowel sounds are normal. She exhibits no distension.  Genitourinary:  Genitourinary Comments: Improved redness to the abd folds and groin  Musculoskeletal: She exhibits edema (BLE edema +1). She exhibits no tenderness or deformity.  Lymphadenopathy:    She has no cervical adenopathy.  Neurological: She is alert and oriented to person, place, and time.  Forgetful of the details of her care and has short term memory loss at baseline  Skin: Skin is warm and dry. She is not diaphoretic. There is erythema (Bilat lower ext with redness and warmth below the knee to the ankle, no drainage).  No adenopathy to the groin.   Psychiatric: She has a normal mood and affect.  Nursing note and vitals reviewed.   Labs reviewed: Recent Labs    07/27/17 1342 07/28/17 0700 01/25/18  NA 135* 136* 139  K 4.5 4.2 4.4  BUN 13 14 12   CREATININE 0.7 0.5 0.6   Recent Labs    07/27/17 1342  AST 17  ALT 9  ALKPHOS 106   Recent Labs    07/27/17 1342 07/28/17 0700 01/25/18  WBC 8.0 6.8 7.4  NEUTROABS  --   --  4  HGB 12.7 13.0 12.0  HCT 38 39 36  PLT 247 210 219   Lab Results  Component Value Date   TSH 3.44 12/09/2017   No results found for: HGBA1C Lab Results  Component Value Date   CHOL 214 (A) 07/27/2017   HDL 60 07/27/2017   LDLCALC 134 07/27/2017   TRIG 104 07/27/2017    Significant Diagnostic Results in last 30 days:  No results  found.  Assessment/Plan  1. Bilateral cellulitis of lower leg Slight improvement in edema but no change in redness. No signs of toxicity. Discussed her case with Dr. Mariea Clonts. Will discontinue Ancef on 9/6 and start Bactrim DS 1 tab bid x 10 days.    2. Localized edema Likely due to cellulitis as it started in one leg and progressed to the other with redness.  Compression wraps on in the am and off in the pm Keep legs  elevated.    Family/ staff Communication: discussed with her daughter Ms. McGarry  Labs/tests ordered:  none

## 2018-02-01 ENCOUNTER — Ambulatory Visit (INDEPENDENT_AMBULATORY_CARE_PROVIDER_SITE_OTHER): Payer: Medicare Other | Admitting: *Deleted

## 2018-02-01 DIAGNOSIS — I4430 Unspecified atrioventricular block: Secondary | ICD-10-CM | POA: Diagnosis not present

## 2018-02-01 NOTE — Progress Notes (Signed)
Remote pacemaker transmission.   

## 2018-02-02 ENCOUNTER — Encounter: Payer: Self-pay | Admitting: Internal Medicine

## 2018-02-02 ENCOUNTER — Ambulatory Visit (INDEPENDENT_AMBULATORY_CARE_PROVIDER_SITE_OTHER): Payer: Medicare Other | Admitting: Internal Medicine

## 2018-02-02 ENCOUNTER — Other Ambulatory Visit: Payer: Self-pay

## 2018-02-02 ENCOUNTER — Telehealth: Payer: Self-pay

## 2018-02-02 VITALS — BP 120/69 | HR 62 | Temp 98.2°F

## 2018-02-02 DIAGNOSIS — I6523 Occlusion and stenosis of bilateral carotid arteries: Secondary | ICD-10-CM

## 2018-02-02 DIAGNOSIS — I872 Venous insufficiency (chronic) (peripheral): Secondary | ICD-10-CM | POA: Diagnosis not present

## 2018-02-02 NOTE — Telephone Encounter (Signed)
Patients daughter called today regarding paper work she forgot to give Dr. Linus Salmons to fill out for patient. Patient's daughter states she is concerned that living facility will not stop mother's antibiotics and would like a copy of notes from todays office visit incase she has any pushback from facility.  Will fax a copy of today office note to [place], [place]

## 2018-02-02 NOTE — Progress Notes (Signed)
Patient ID: [name], female   DOB: 12-07-1928, 84 y.o.   MRN: [id number]     [facility]      Reason for Consult: recurrent celllulitis    Referring Physician: Dr. Mariea Clonts    Patient ID: [name], female    DOB: 1929/05/19, 84 y.o.   MRN: [id number]  HPI:   She is here with a history of recurrent skin infection of her legs, groin and periorbital cellulitis.  She noted bilateral leg erythema while in [place] and since being at [facility] has had issues with groin foliculitis and bilateral leg redness.  She has been on several courses of antibiotics including initially Keflex 500 mg BID, then TID, followed by doxycycline and more recently Ancef and Bactrim.  The daughter is present as well.  She shows pictures of her recent infection and relates similar findings previously of bilateral leg edema and erythema.  She also does recall a more remote episode of cellulitis in a small area on one of her legs that resolved with antibiotics.  She has a history of venous insufficiency on her problem list and verified by the daughter. At the time of her periorbital cellulitis, she had been itching her eye and it became red and swollen.  Resolved with antibiotics then.  She has had no associated fever, chills or leukocytosis.   Previous record reviewed from the rehab center and history as above.   Past Medical History:  Diagnosis Date  . Breast cancer ([place])   . Colon polyps   . Coronary artery disease 06/2006   STEMI inferior with BMS to mid RCA with mid basal inferior wall HK and luminal irrgularities elsewhere  . Fracture of femoral neck, right ([place]) 06/13/2011  . Fracture of humeral head, right, closed 06/13/2011  . GERD (gastroesophageal reflux disease)   . Hemorrhoids   . Hyperlipemia   . Hypertension   . LBBB (left bundle branch block)   . Macular degeneration   . Myocardial infarction ([place])   . Obstructive sleep apnea on CPAP   . Osteopenia   .  Osteopenia   . Pacemaker 02/25/14  . Rhinitis   . Rotator cuff syndrome   . SAH (subarachnoid hemorrhage) ([place]) 02/22/14  . 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 Ulnar neuropathy   . Umbilical hernia   . Unstable gait     Prior to Admission medications   Medication Sig Start Date End Date Taking? Authorizing Provider  aspirin EC 81 MG tablet Take 81 mg by mouth daily.    [provider]  ceFAZolin (ANCEF) IVPB Inject 1 g into the vein every 8 (eight) hours. 01/24/18   [provider]  ceFAZolin 1 g in sodium chloride 0.9 % 1,000 mL by Other route once.    [provider]  chlorhexidine (HIBICLENS) 4 % external liquid Apply 1 application topically 2 (two) times a week.    [provider]  Cholecalciferol (VITAMIN D3) 2000 UNITS TABS Take 3,000 Units by mouth daily.     [provider]  Coenzyme Q10 (COQ10) 200 MG CAPS Take 1 tablet by mouth daily.    [provider]  donepezil (ARICEPT) 5 MG tablet Take 5 mg by mouth at bedtime.    [provider]  DULoxetine (CYMBALTA) 60 MG capsule Take 60 mg by mouth daily.    [provider]  furosemide (LASIX) 40 MG tablet Take 1 tablet (40 mg total) by mouth daily. 03/05/16   Irish Lack,  Charlann Lange, MD  Hypromellose (ARTIFICIAL TEARS OP) Apply 1 drop to eye 2 (two) times daily as needed (dry eyes).    [provider]  levothyroxine (SYNTHROID, LEVOTHROID) 25 MCG tablet Take 25 mcg by mouth daily before breakfast.  10/27/17   [provider]  losartan (COZAAR) 100 MG tablet Take 100 mg by mouth daily.    [provider]  Multiple Vitamins-Minerals (PRESERVISION/LUTEIN) CAPS Take 1 capsule by mouth 2 (two) times daily.    [provider]  nebivolol (BYSTOLIC) 5 MG tablet Take 1 tablet (5 mg total) by mouth daily. 03/14/17   Evans Lance, MD  nitroGLYCERIN (NITROSTAT) 0.4 MG SL tablet Place 1 tablet (0.4 mg total) under the tongue every 5 (five) minutes as  needed for chest pain. 08/23/16   Jettie Booze, MD  potassium chloride SA (K-DUR,KLOR-CON) 20 MEQ tablet Take 20 mEq by mouth daily.  09/14/17   [provider]  saccharomyces boulardii (FLORASTOR) 250 MG capsule Take 250 mg by mouth 2 (two) times daily.    [provider]  sulfamethoxazole-trimethoprim (BACTRIM DS,SEPTRA DS) 800-160 MG tablet Take 1 tablet by mouth 2 (two) times daily. FOR 10 DAYS 01/26/18 02/05/18  Royal Hawthorn, NP  vitamin B-12 (CYANOCOBALAMIN) 1000 MCG tablet Take 1,000 mcg by mouth daily.    [provider]    Allergies  Allergen Reactions  . Chlorpheniramine Anaphylaxis  . Codeine Anaphylaxis  . Amlodipine     Per [facility]   . Nystatin     redness    Social History   Tobacco Use  . Smoking status: Never Smoker  . Smokeless tobacco: Never Used  Substance Use Topics  . Alcohol use: Yes    Alcohol/week: 0.0 standard drinks    Comment: occasional wine  . Drug use: Not on file    Family History  Problem Relation Age of Onset  . Heart attack Father 67  . CAD Father   . CVA Sister     Review of Systems  Constitutional: negative for fevers, chills, sweats and anorexia Gastrointestinal: negative for nausea and diarrhea All other systems reviewed and are negative    Constitutional: in no apparent distress and non-toxic There were no vitals filed for this visit. EYES: anicteric ENMT: no thrush Cardiovascular: Cor RRR Respiratory: CTA B; normal respiratory effort GI: soft Musculoskeletal: peripheral pulses normal, no pedal edema, no clubbing or cyanosis, pedal edema about 2 + and equal bilaterally Skin: bilateral legs with edema, erythema, minimal warmth, no tenderness Hematologic: no cervical lad  Labs: Lab Results  Component Value Date   WBC 7.4 01/25/2018   HGB 12.0 01/25/2018   HCT 36 01/25/2018   MCV 88.7 02/22/2014   PLT 219 01/25/2018    Lab Results  Component Value Date   CREATININE 0.6  01/25/2018   BUN 12 01/25/2018   NA 139 01/25/2018   K 4.4 01/25/2018   CL 92 (L) 02/22/2014   CO2 23 02/22/2014    Lab Results  Component Value Date   ALT 9 07/27/2017   AST 17 07/27/2017   ALKPHOS 106 07/27/2017   BILITOT 0.6 06/06/2011   INR 0.98 02/22/2014     Assessment: vascular insufficiency.  On exam this is c/w non-infectious venous insufficiency without warmth or tenderness. By history as well per the daughter, this has not been excessively warm and non-tender in previous episodes making it inconsistent with infectious cellulitis.  Furthermore, being bilateral, not responsive to appropriate antibiotics, normal WBC,  no fever and recurrent episodes, makes infection also unlikely.   This was discussed with the daughter.  I would anticipate continued erythema that is recurrent, sloughing of skin due to edema.     Plan: 1) stop antibiotics and observe 2) increase walking 3) compression stalkings 4) return as needed for concerns for infection  Thanks for consultation

## 2018-02-03 NOTE — Telephone Encounter (Signed)
Thanks.  A copy was sent and they are in Epic.

## 2018-02-07 ENCOUNTER — Telehealth: Payer: Self-pay | Admitting: Interventional Cardiology

## 2018-02-07 NOTE — Telephone Encounter (Signed)
Patient's daughter calling back. Made her aware that I spoke with the billing manager and made her aware she said that if we were to schedule the patient with both providers on the same day the patient would be held responsible for paying out of pocket costs for the second provider that the insurance does not cover. The daughter states that she is not willing to do that. She states that it would be either her or her brother to bring the patient to the appointment and she lives in [place].  Scheduled the patient to see JV on 10/10 and GT on 12/4. Daughter was also wanting to know if we had been receiving the patient's PPM remote transmissions. Checked with device RN, and made her aware that they received a transmission last on 9/11 and she said that there were no issues noted. Daughter also states that the patient will also be due for follow up with TT for sleep. Appointment scheduled to see TT on 12/2. Made daughter aware that I will discuss this further with management. Instructed for her to let us know if the patient's Sx change. She verbalized understanding.

## 2018-02-07 NOTE — Telephone Encounter (Signed)
New message   Pt c/o swelling: STAT is pt has developed SOB within 24 hours  1) How much weight have you gained and in what time span? 25 lbs within  3 months   If swelling, where is the swelling located?Both Lower Legs 2) Are you currently taking a fluid pill? Yes   3) Are you currently SOB? No   4) Do you have a log of your daily weights (if so, list)? Log is located  at [facility]  5) Have you gained 3 pounds in a day or 5 pounds in a week? Don't know   6) Have you traveled recently?no

## 2018-02-07 NOTE — Telephone Encounter (Signed)
Returned call to patient's daughter (DPR on file). She states that she would like to schedule the patient's f/u appointment with Dr. Irish Lack. She states that the patinet has had swelling in her lower extremities for a while now and states that she had been treated fr cellulitis. She states that the doctor that saw the patient said that the patient has PVD and should follow up with cardiology. She states the the patient's legs are swollen, red, and shiny. She states that the patient has gained about 25 lbs over the past 3 months since she has been at [facility]. The daughter states that she does not think that this is fluid. She states that the patient has dementia and forgets that she has eaten and will eat again. She states that the patient went from a 1X to a 3X in pant size. She states that the patient is not SOB and denies any additional Sx. She states that the patient should be taking her meds as prescribed. She states that Dr. Hollace Kinnier is her PCP and comes to see her in the facility. She has an appointment with her tomorrow.   Daughter is requesting that appointment for Dr. Irish Lack and Dr. Lovena Le be scheduled on the same day. I explained to her we cannot schedule 2 cardiologist appointments on the same day unless one is a new consult as the insurance will not pay to see both providers. She states that she lives in [place] and thinks that it is the best thing for the patient to have both appointments on the same day. She is requesting that I speak with billing regarding this before scheduling an appointment. She understands that I will call her back after I speak to the billing manager. She is to let us know if the patient's Sx worsen.

## 2018-02-07 NOTE — Telephone Encounter (Signed)
Left message to call back  

## 2018-02-08 ENCOUNTER — Non-Acute Institutional Stay: Payer: Medicare Other | Admitting: Internal Medicine

## 2018-02-08 ENCOUNTER — Encounter: Payer: Self-pay | Admitting: Internal Medicine

## 2018-02-08 VITALS — BP 140/80 | HR 60 | Temp 98.4°F | Ht 66.0 in | Wt 227.0 lb

## 2018-02-08 DIAGNOSIS — F329 Major depressive disorder, single episode, unspecified: Secondary | ICD-10-CM | POA: Diagnosis not present

## 2018-02-08 DIAGNOSIS — F0150 Vascular dementia without behavioral disturbance: Secondary | ICD-10-CM | POA: Diagnosis not present

## 2018-02-08 DIAGNOSIS — L03116 Cellulitis of left lower limb: Secondary | ICD-10-CM

## 2018-02-08 DIAGNOSIS — L03115 Cellulitis of right lower limb: Secondary | ICD-10-CM

## 2018-02-08 DIAGNOSIS — I6523 Occlusion and stenosis of bilateral carotid arteries: Secondary | ICD-10-CM

## 2018-02-08 DIAGNOSIS — R3981 Functional urinary incontinence: Secondary | ICD-10-CM

## 2018-02-08 DIAGNOSIS — I872 Venous insufficiency (chronic) (peripheral): Secondary | ICD-10-CM

## 2018-02-08 NOTE — Progress Notes (Signed)
Location:  [facility] clinic Provider:  Aicha Clingenpeel L. Mariea Clonts, D.O., C.M.D.  Code Status: DNR Goals of Care:  Advanced Directives 02/08/2018  Does Patient Have a Medical Advance Directive? Yes  Type of Paramedic of [place];Out of facility DNR (pink MOST or yellow form)  Does patient want to make changes to medical advance directive? No - Patient declined  Copy of [place] in Chart? Yes  Pre-existing out of facility DNR order (yellow form or pink MOST form) Yellow form placed in chart (order not valid for inpatient use)   Chief Complaint  Patient presents with  . Medical Management of Chronic Issues    79mth follow-up    HPI: Patient is a 84 y.o. female seen today for medical management of chronic diseases.    Pt had cellulitis that was treated to resolution.  She continues with some chronic venous insufficiency and athlete's foot difficulties/fungal infection.    Spirits seem a bit better. She talks about things she used to do before moving to [facility], but it's actually been a long time before that since she did them per family (like regular church outings and visits with friends).  She gets frustrated that many of her colleagues in [place] communicate as well as she can.  She denied urinary symptoms or rashes in her vaginal area at this time.  She struggles with urinary incontinence.    Her memory is gradually declining and she needs a lot of reminders for self care and assistance for good hygiene practices.  Past Medical History:  Diagnosis Date  . Breast cancer ([place])   . Colon polyps   . Coronary artery disease 06/2006   STEMI inferior with BMS to mid RCA with mid basal inferior wall HK and luminal irrgularities elsewhere  . Fracture of femoral neck, right ([place]) 06/13/2011  . Fracture of humeral head, right, closed 06/13/2011  . GERD (gastroesophageal reflux disease)   . Hemorrhoids   . Hyperlipemia   . Hypertension   . LBBB (left bundle branch  block)   . Macular degeneration   . Myocardial infarction ([place])   . Obstructive sleep apnea on CPAP   . Osteopenia   . Osteopenia   . Pacemaker 02/25/14  . Rhinitis   . Rotator cuff syndrome   . SAH (subarachnoid hemorrhage) ([place]) 02/22/14  . 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 Ulnar neuropathy   . Umbilical hernia   . Unstable gait     Past Surgical History:  Procedure Laterality Date  . BREAST LUMPECTOMY    . CATARACT EXTRACTION, BILATERAL     Per [facility]  . CORONARY ANGIOPLASTY WITH STENT PLACEMENT  06/2006   Mid RCA BMS  . HIP ARTHROPLASTY  06/06/2011   Procedure: ARTHROPLASTY BIPOLAR HIP;  Surgeon: Gearlean Alf;  Location: [facility];  Service: Orthopedics;  Laterality: Right;  . PERMANENT PACEMAKER INSERTION N/A 02/25/2014   Procedure: PERMANENT PACEMAKER INSERTION;  Surgeon: Evans Lance, MD;  Location: [facility] CATH LAB;  Service: Cardiovascular;  Laterality: N/A;  . SHOULDER SURGERY    . TONSILLECTOMY     Per [facility]   . WRIST SURGERY      Allergies  Allergen Reactions  . Chlorpheniramine Anaphylaxis  . Codeine Anaphylaxis  . Amlodipine     Per [facility]   . Nystatin     redness    Outpatient Encounter Medications as of 02/08/2018  Medication Sig  . aspirin EC 81 MG  tablet Take 81 mg by mouth daily.  . chlorhexidine (HIBICLENS) 4 % external liquid Apply 1 application topically 2 (two) times a week.  . Cholecalciferol (VITAMIN D3) 2000 UNITS TABS Take 3,000 Units by mouth daily.   . Coenzyme Q10 (COQ10) 200 MG CAPS Take 1 tablet by mouth daily.  Marland Kitchen donepezil (ARICEPT) 5 MG tablet Take 5 mg by mouth at bedtime.  . DULoxetine (CYMBALTA) 60 MG capsule Take 60 mg by mouth daily.  . furosemide (LASIX) 40 MG tablet Take 1 tablet (40 mg total) by mouth daily.  . Hypromellose (ARTIFICIAL TEARS OP) Apply 1 drop to eye 2 (two) times daily as needed (dry eyes).  Marland Kitchen levothyroxine (SYNTHROID, LEVOTHROID) 25 MCG tablet Take 25 mcg by mouth  daily before breakfast.   . losartan (COZAAR) 100 MG tablet Take 100 mg by mouth daily.  . Multiple Vitamins-Minerals (PRESERVISION/LUTEIN) CAPS Take 1 capsule by mouth 2 (two) times daily.  . nebivolol (BYSTOLIC) 5 MG tablet Take 1 tablet (5 mg total) by mouth daily.  . nitroGLYCERIN (NITROSTAT) 0.4 MG SL tablet Place 1 tablet (0.4 mg total) under the tongue every 5 (five) minutes as needed for chest pain.  . potassium chloride SA (K-DUR,KLOR-CON) 20 MEQ tablet Take 20 mEq by mouth daily.   . vitamin B-12 (CYANOCOBALAMIN) 1000 MCG tablet Take 1,000 mcg by mouth daily.  . [DISCONTINUED] ceFAZolin (ANCEF) IVPB Inject 1 g into the vein every 8 (eight) hours.  . [DISCONTINUED] ceFAZolin 1 g in sodium chloride 0.9 % 1,000 mL by Other route once.  . [DISCONTINUED] saccharomyces boulardii (FLORASTOR) 250 MG capsule Take 250 mg by mouth 2 (two) times daily.   No facility-administered encounter medications on file as of 02/08/2018.     Review of Systems:  Review of Systems  Constitutional: Negative for chills, fever, malaise/fatigue and weight loss.  HENT: Negative for congestion.   Eyes: Negative for blurred vision.  Respiratory: Negative for cough and shortness of breath.   Cardiovascular: Positive for leg swelling. Negative for chest pain.  Gastrointestinal: Negative for abdominal pain, blood in stool, constipation, diarrhea and melena.  Genitourinary: Positive for frequency and urgency. Negative for dysuria.       OAB  Musculoskeletal: Negative for falls and joint pain.  Skin: Positive for rash. Negative for itching.       Of feet and lower legs  Neurological: Negative for dizziness and loss of consciousness.  Endo/Heme/Allergies: Bruises/bleeds easily.  Psychiatric/Behavioral: Positive for depression and memory loss. The patient is not nervous/anxious and does not have insomnia.     Health Maintenance  Topic Date Due  . INFLUENZA VACCINE  12/22/2017  . TETANUS/TDAP  01/17/2023  . DEXA  SCAN  Completed  . PNA vac Low Risk Adult  Completed    Physical Exam: Vitals:   02/08/18 1542  BP: 140/80  Pulse: 60  Temp: 98.4 F (36.9 C)  TempSrc: Oral  SpO2: 96%  Weight: 227 lb (103 kg)  Height: 5\' 6"  (1.676 m)   Body mass index is 36.64 kg/m. Physical Exam  Constitutional: She appears well-developed and well-nourished. No distress.  HENT:  Head: Normocephalic and atraumatic.  Cardiovascular: Normal rate, regular rhythm, normal heart sounds and intact distal pulses.  Pulmonary/Chest: Effort normal and breath sounds normal. No respiratory distress.  Abdominal: Soft. Bowel sounds are normal. She exhibits no distension. There is no tenderness.  Musculoskeletal:  Ambulates slowly with rollator  Neurological: She is alert.  Oriented to person and place, but does get lost  around [facility] if not accompanied  Skin: Skin is warm and dry.  Erythema and scaly skin of feet and between toes, chronic erythema and venous insufficiency of legs  Psychiatric: She has a normal mood and affect.    Labs reviewed: Basic Metabolic Panel: Recent Labs    07/27/17 1342 07/28/17 0700 12/09/17 0300 01/25/18  NA 135* 136*  --  139  K 4.5 4.2  --  4.4  BUN 13 14  --  12  CREATININE 0.7 0.5  --  0.6  TSH 2.84 4.05 3.44  --    Liver Function Tests: Recent Labs    07/27/17 1342  AST 17  ALT 9  ALKPHOS 106   No results for input(s): LIPASE, AMYLASE in the last 8760 hours. No results for input(s): AMMONIA in the last 8760 hours. CBC: Recent Labs    07/27/17 1342 07/28/17 0700 01/25/18  WBC 8.0 6.8 7.4  NEUTROABS  --   --  4  HGB 12.7 13.0 12.0  HCT 38 39 36  PLT 247 210 219   Lipid Panel: Recent Labs    07/27/17 1342  CHOL 214*  HDL 60  LDLCALC 134  TRIG 104   Assessment/Plan 1. Bilateral cellulitis of lower leg -resolved  2. Vascular dementia without behavioral disturbance ([place]) -progressing and needing more help with her self care to prevent infections,  forgets and needs a lot of cues   3. Reactive depression -seems better than last visit, talks more with staff since many residents can't communicate as well  4. Functional urinary incontinence -ongoing, needs cues and assistance to change pads and depends as directed and maintain her hygiene  5. Venous insufficiency -ongoing, will treat for fungal infection with cream after bathing each morning and then apply cream again in the evening after compression hose come off  Labs/tests ordered:  No new Next appt:  06/14/2018  Naleyah Ohlinger L. Jomari Bartnik, D.O. [facility] [address], [place] [address] Cell Phone (Mon-Fri 8am-5pm):  [phone] On Call:  [phone] & follow prompts after 5pm & weekends Office Phone:  [phone] Office Fax:  [phone]

## 2018-02-10 NOTE — Telephone Encounter (Signed)
Daughter returned call. Made her aware that I discussed this further with management and they have okayed for the patient to see JV and GT on the same day and if the insurance does not cover the 2nd visit then they will not be responsible for the bill. Appointment made for patient to see Dr. Irish Lack on 03/22/18 at 11:40 AM and for patient to see Dr. Lovena Le on 03/22/18 at 12:30 PM (time approved by Sonia Baller, RN). Management will follow this. Patient will keep appointment with Dr. Radford Pax on 04/24/18. Daughter will let us know if Sx change or worsen and we will bring her in to see APP. Daughter very appreciative for this.

## 2018-02-10 NOTE — Telephone Encounter (Signed)
Left message for patient to call back  

## 2018-02-13 DIAGNOSIS — L603 Nail dystrophy: Secondary | ICD-10-CM | POA: Diagnosis not present

## 2018-02-16 ENCOUNTER — Non-Acute Institutional Stay: Payer: Medicare Other | Admitting: Adult Health

## 2018-02-16 DIAGNOSIS — B373 Candidiasis of vulva and vagina: Secondary | ICD-10-CM | POA: Diagnosis not present

## 2018-02-16 DIAGNOSIS — B3731 Acute candidiasis of vulva and vagina: Secondary | ICD-10-CM

## 2018-02-16 DIAGNOSIS — H00011 Hordeolum externum right upper eyelid: Secondary | ICD-10-CM

## 2018-02-16 NOTE — Progress Notes (Signed)
Location:  Occupational psychologist of Service:  ALF (13) Provider:   Cindi Carbon, ANP [place] [phone]   Gayland Curry, DO  Patient Care Team: Gayland Curry, DO as PCP - General (Geriatric Medicine) Justice Britain, MD as Consulting Physician (Orthopedic Surgery) Gaynelle Arabian, MD as Consulting Physician (Orthopedic Surgery) Garvin Fila, MD as Consulting Physician (Neurology) Evans Lance, MD as Consulting Physician (Cardiology) Jettie Booze, MD as Consulting Physician (Interventional Cardiology) Sueanne Margarita, MD as Consulting Physician (Cardiology) Shon Hough, MD as Consulting Physician (Ophthalmology)  Extended Emergency Contact Information Primary Emergency Contact: [place],JED Address: [place], [place] Phone: [phone] Mobile Phone: [phone] Relation: Son Secondary Emergency Contact: Eva Mobile Phone: [phone] Relation: Daughter  Code Status:  DNR Goals of care: Advanced Directive information Advanced Directives 02/08/2018  Does Patient Have a Medical Advance Directive? Yes  Type of Paramedic of [place];Out of facility DNR (pink MOST or yellow form)  Does patient want to make changes to medical advance directive? No - Patient declined  Copy of [place] in Chart? Yes  Pre-existing out of facility DNR order (yellow form or pink MOST form) Yellow form placed in chart (order not valid for inpatient use)     Chief Complaint  Patient presents with  . Acute Visit    right eye redness and vaginal itching    HPI:  Pt is a 84 y.o. female seen today for an acute visit for right eye redness present for two days. She has tried warm compresses and it has only worsened to the top upper right lid and is also more painful. No changes in vision. The nurse reports that she has poor hygiene and can  share washcloths used on her vaginal area and her eye.  Resident also reports vaginal itching and redness with no drainage or urinary symptoms. She has significant incontinence and is on a q 2 hr toileting program.   Past Medical History:  Diagnosis Date  . Breast cancer ([place])   . Colon polyps   . Coronary artery disease 06/2006   STEMI inferior with BMS to mid RCA with mid basal inferior wall HK and luminal irrgularities elsewhere  . Fracture of femoral neck, right ([place]) 06/13/2011  . Fracture of humeral head, right, closed 06/13/2011  . GERD (gastroesophageal reflux disease)   . Hemorrhoids   . Hyperlipemia   . Hypertension   . LBBB (left bundle branch block)   . Macular degeneration   . Myocardial infarction ([place])   . Obstructive sleep apnea on CPAP   . Osteopenia   . Osteopenia   . Pacemaker 02/25/14  . Rhinitis   . Rotator cuff syndrome   . SAH (subarachnoid hemorrhage) ([place]) 02/22/14  . 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 Ulnar neuropathy   . Umbilical hernia   . Unstable gait    Past Surgical History:  Procedure Laterality Date  . BREAST LUMPECTOMY    . CATARACT EXTRACTION, BILATERAL     Per [facility]  . CORONARY ANGIOPLASTY WITH STENT PLACEMENT  06/2006   Mid RCA BMS  . HIP ARTHROPLASTY  06/06/2011   Procedure: ARTHROPLASTY BIPOLAR HIP;  Surgeon: Gearlean Alf;  [facility]: [facility];  Service: Orthopedics;  Laterality: Right;  . PERMANENT PACEMAKER INSERTION N/A 02/25/2014   Procedure: PERMANENT PACEMAKER INSERTION;  Surgeon: Evans Lance,  MD;  Location: [facility] CATH LAB;  Service: Cardiovascular;  Laterality: N/A;  . SHOULDER SURGERY    . TONSILLECTOMY     Per [facility]   . WRIST SURGERY      Allergies  Allergen Reactions  . Chlorpheniramine Anaphylaxis  . Codeine Anaphylaxis  . Amlodipine     Per [facility]   . Nystatin     redness    Outpatient Encounter Medications as of 02/16/2018  Medication Sig  . aspirin EC 81 MG  tablet Take 81 mg by mouth daily.  . chlorhexidine (HIBICLENS) 4 % external liquid Apply 1 application topically 2 (two) times a week.  . Cholecalciferol (VITAMIN D3) 2000 UNITS TABS Take 3,000 Units by mouth daily.   . Coenzyme Q10 (COQ10) 200 MG CAPS Take 1 tablet by mouth daily.  Marland Kitchen donepezil (ARICEPT) 5 MG tablet Take 5 mg by mouth at bedtime.  . DULoxetine (CYMBALTA) 60 MG capsule Take 60 mg by mouth daily.  . furosemide (LASIX) 40 MG tablet Take 1 tablet (40 mg total) by mouth daily.  . Hypromellose (ARTIFICIAL TEARS OP) Apply 1 drop to eye 2 (two) times daily as needed (dry eyes).  Marland Kitchen levothyroxine (SYNTHROID, LEVOTHROID) 25 MCG tablet Take 25 mcg by mouth daily before breakfast.   . losartan (COZAAR) 100 MG tablet Take 100 mg by mouth daily.  . Multiple Vitamins-Minerals (PRESERVISION/LUTEIN) CAPS Take 1 capsule by mouth 2 (two) times daily.  . nebivolol (BYSTOLIC) 5 MG tablet Take 1 tablet (5 mg total) by mouth daily.  . nitroGLYCERIN (NITROSTAT) 0.4 MG SL tablet Place 1 tablet (0.4 mg total) under the tongue every 5 (five) minutes as needed for chest pain.  . potassium chloride SA (K-DUR,KLOR-CON) 20 MEQ tablet Take 20 mEq by mouth daily.   . vitamin B-12 (CYANOCOBALAMIN) 1000 MCG tablet Take 1,000 mcg by mouth daily.   No facility-administered encounter medications on file as of 02/16/2018.     Review of Systems  Constitutional: Negative for activity change, appetite change, chills, diaphoresis, fatigue, fever and unexpected weight change.  HENT: Negative for congestion.   Eyes: Positive for pain, redness and itching. Negative for photophobia, discharge and visual disturbance.  Respiratory: Negative for cough, shortness of breath and wheezing.   Cardiovascular: Positive for leg swelling. Negative for chest pain and palpitations.  Gastrointestinal: Negative for abdominal distention, abdominal pain, constipation and diarrhea.  Genitourinary: Negative for difficulty urinating, dysuria,  frequency, hematuria, vaginal bleeding and vaginal discharge.       Vaginal itching and redness  Musculoskeletal: Positive for gait problem. Negative for arthralgias, back pain, joint swelling and myalgias.  Neurological: Negative for dizziness, tremors, seizures, syncope, facial asymmetry, speech difficulty, weakness, light-headedness, numbness and headaches.  Psychiatric/Behavioral: Positive for confusion. Negative for agitation and behavioral problems.    Immunization History  Administered Date(s) Administered  . Influenza, High Dose Seasonal PF 02/18/2017  . Influenza-Unspecified 03/04/2016  . Pneumococcal Conjugate-13 05/08/2014  . Pneumococcal Polysaccharide-23 10/08/2017  . Td 07/09/2002, 01/16/2013  . Zoster 06/30/2010  . Zoster Recombinat (Shingrix) 11/18/2017, 01/17/2018   Pertinent  Health Maintenance Due  Topic Date Due  . INFLUENZA VACCINE  12/22/2017  . DEXA SCAN  Completed  . PNA vac Low Risk Adult  Completed   Fall Risk  02/08/2018 02/02/2018 11/09/2017 10/04/2017 09/28/2017  Falls in the past year? No No No No No  Number falls in past yr: - - - - -  Injury with Fall? - - - - -  Risk Factor Category  - - - - -  Risk for fall due to : - - - - -   Functional Status Survey:    Vitals:   02/17/18 1250  Temp: 98 F (36.7 C)   There is no height or weight on file to calculate BMI. Physical Exam  Constitutional: No distress.  HENT:  Head: Normocephalic and atraumatic.  Nose: Nose normal.  Mouth/Throat: Oropharynx is clear and moist. No oropharyngeal exudate.  Eyes: Pupils are equal, round, and reactive to light. Conjunctivae are normal. Right eye exhibits hordeolum (right upper lid with erythema and swelling with small pustule). Right eye exhibits no chemosis, no discharge and no exudate. No foreign body present in the right eye. Left eye exhibits no chemosis, no discharge and no exudate. No foreign body present in the left eye.  Neck: No JVD present.    Cardiovascular: Normal rate and regular rhythm.  No murmur heard. BLE edema +1/2 with mild erythema to BLE  Pulmonary/Chest: Effort normal and breath sounds normal. No respiratory distress. She has no wheezes.  Abdominal: Soft. Bowel sounds are normal.  Neurological: She is alert.  Oriented x 2  Skin: Skin is warm and dry. She is not diaphoretic.  Psychiatric: She has a normal mood and affect.  Nursing note and vitals reviewed.   Labs reviewed: Recent Labs    07/27/17 1342 07/28/17 0700 01/25/18  NA 135* 136* 139  K 4.5 4.2 4.4  BUN 13 14 12   CREATININE 0.7 0.5 0.6   Recent Labs    07/27/17 1342  AST 17  ALT 9  ALKPHOS 106   Recent Labs    07/27/17 1342 07/28/17 0700 01/25/18  WBC 8.0 6.8 7.4  NEUTROABS  --   --  4  HGB 12.7 13.0 12.0  HCT 38 39 36  PLT 247 210 219   Lab Results  Component Value Date   TSH 3.44 12/09/2017   No results found for: HGBA1C Lab Results  Component Value Date   CHOL 214 (A) 07/27/2017   HDL 60 07/27/2017   LDLCALC 134 07/27/2017   TRIG 104 07/27/2017    Significant Diagnostic Results in last 30 days:  No results found.  Assessment/Plan  1. Hordeolum externum of right upper eyelid Worsening with tenderness and itching and not responding to warm compresses Polymyxin B ointment QID x 7 days to right eye   2. Vaginal candidiasis Monistat 7 qhs x 7 days    Family/ staff Communication: resident/nurse  Labs/tests ordered: NA

## 2018-02-17 ENCOUNTER — Encounter: Payer: Self-pay | Admitting: Adult Health

## 2018-02-22 DIAGNOSIS — L821 Other seborrheic keratosis: Secondary | ICD-10-CM | POA: Diagnosis not present

## 2018-02-22 DIAGNOSIS — L723 Sebaceous cyst: Secondary | ICD-10-CM | POA: Diagnosis not present

## 2018-02-22 DIAGNOSIS — L814 Other melanin hyperpigmentation: Secondary | ICD-10-CM | POA: Diagnosis not present

## 2018-02-22 DIAGNOSIS — I8311 Varicose veins of right lower extremity with inflammation: Secondary | ICD-10-CM | POA: Diagnosis not present

## 2018-02-22 DIAGNOSIS — I8312 Varicose veins of left lower extremity with inflammation: Secondary | ICD-10-CM | POA: Diagnosis not present

## 2018-02-22 DIAGNOSIS — I872 Venous insufficiency (chronic) (peripheral): Secondary | ICD-10-CM | POA: Diagnosis not present

## 2018-02-22 LAB — CUP PACEART REMOTE DEVICE CHECK
Battery Remaining Longevity: 118 mo
Battery Remaining Percentage: 95.5 %
Battery Voltage: 2.99 V
Brady Statistic AP VP Percent: 69 %
Brady Statistic RA Percent Paced: 63 %
Brady Statistic RV Percent Paced: 93 %
Date Time Interrogation Session: 20190911070639
Implantable Lead Implant Date: 20151005
Implantable Lead Location: 753860
Implantable Pulse Generator Implant Date: 20151005
Lead Channel Impedance Value: 450 Ohm
Lead Channel Pacing Threshold Pulse Width: 0.5 ms
Lead Channel Sensing Intrinsic Amplitude: 8.6 mV
Lead Channel Setting Pacing Amplitude: 2 V
MDC IDC LEAD IMPLANT DT: 20151005
MDC IDC LEAD LOCATION: 753859
MDC IDC MSMT LEADCHNL RA PACING THRESHOLD AMPLITUDE: 1 V
MDC IDC MSMT LEADCHNL RA SENSING INTR AMPL: 5 mV
MDC IDC MSMT LEADCHNL RV IMPEDANCE VALUE: 600 Ohm
MDC IDC MSMT LEADCHNL RV PACING THRESHOLD AMPLITUDE: 0.625 V
MDC IDC MSMT LEADCHNL RV PACING THRESHOLD PULSEWIDTH: 0.5 ms
MDC IDC SET LEADCHNL RV PACING AMPLITUDE: 0.875
MDC IDC SET LEADCHNL RV PACING PULSEWIDTH: 0.5 ms
MDC IDC SET LEADCHNL RV SENSING SENSITIVITY: 4 mV
MDC IDC STAT BRADY AP VS PERCENT: 1 %
MDC IDC STAT BRADY AS VP PERCENT: 24 %
MDC IDC STAT BRADY AS VS PERCENT: 1.1 %
Pulse Gen Model: 2240
Pulse Gen Serial Number: 7666288

## 2018-03-02 ENCOUNTER — Ambulatory Visit: Payer: Medicare Other | Admitting: Interventional Cardiology

## 2018-03-19 NOTE — Progress Notes (Signed)
Cardiology Office Note   Date:  03/22/2018   ID:  [name], DOB 09-14-1928, MRN [id number]  PCP:  Gayland Curry, DO    No chief complaint on file.  CAD  Wt Readings from Last 3 Encounters:  03/22/18 231 lb 12.8 oz (105.1 kg)  02/08/18 227 lb (103 kg)  11/09/17 222 lb (100.7 kg)       History of Present Illness: [name] is a 84 y.o. female   who had an inferior MI in 2008, treated with BMS to the RCA.  Her husband has been affected by a couple of strokes. It has been stressful for her caring for him. He now has dementia. He is now in memory care.   She is independent living.   She had a pacer placed by Dr. Lovena Le in 2015.  She has been intolerant of statins.  She was not interested in PCSK9 inhibitor in 2018.  She is a DNR.  THis was previously discussed with her family and is on record at her factility.   Denies : Chest pain. Dizziness. Nitroglycerin use. Orthopnea. Palpitations. Paroxysmal nocturnal dyspnea. Shortness of breath. Syncope.   They have not use diuretics for her swelling due to issues with incontinence.   Past Medical History:  Diagnosis Date  . Breast cancer ([place])   . Colon polyps   . Coronary artery disease 06/2006   STEMI inferior with BMS to mid RCA with mid basal inferior wall HK and luminal irrgularities elsewhere  . Fracture of femoral neck, right ([place]) 06/13/2011  . Fracture of humeral head, right, closed 06/13/2011  . GERD (gastroesophageal reflux disease)   . Hemorrhoids   . Hyperlipemia   . Hypertension   . LBBB (left bundle branch block)   . Macular degeneration   . Myocardial infarction ([place])   . Obstructive sleep apnea on CPAP   . Osteopenia   . Osteopenia   . Pacemaker 02/25/14  . Rhinitis   . Rotator cuff syndrome   . SAH (subarachnoid hemorrhage) ([place]) 02/22/14  . 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 Ulnar neuropathy   . Umbilical hernia   . Unstable gait     Past Surgical History:  Procedure  Laterality Date  . BREAST LUMPECTOMY    . CATARACT EXTRACTION, BILATERAL     Per [facility]  . CORONARY ANGIOPLASTY WITH STENT PLACEMENT  06/2006   Mid RCA BMS  . HIP ARTHROPLASTY  06/06/2011   Procedure: ARTHROPLASTY BIPOLAR HIP;  Surgeon: Gearlean Alf;  Location: [facility];  Service: Orthopedics;  Laterality: Right;  . PERMANENT PACEMAKER INSERTION N/A 02/25/2014   Procedure: PERMANENT PACEMAKER INSERTION;  Surgeon: Evans Lance, MD;  Location: [facility] C[facility] LAB;  Service: Cardiovascular;  Laterality: N/A;  . SHOULDER SURGERY    . TONSILLECTOMY     Per [facility]   . WRIST SURGERY       Current Outpatient Medications  Medication Sig Dispense Refill  . aspirin EC 81 MG tablet Take 81 mg by mouth daily.    . chlorhexidine (HIBICLENS) 4 % external liquid Apply 1 application topically 2 (two) times a week.    . Cholecalciferol (VITAMIN D3) 2000 UNITS TABS Take 3,000 Units by mouth daily.     . Coenzyme Q10 (COQ10) 200 MG CAPS Take 1 tablet by mouth daily.    Marland Kitchen donepezil (ARICEPT) 5 MG tablet Take 5 mg by mouth at bedtime.    . DULoxetine (CYMBALTA) 60 MG  capsule Take 60 mg by mouth daily.    . furosemide (LASIX) 40 MG tablet Take 1 tablet (40 mg total) by mouth daily as needed (swelling or weight gain). 90 tablet 3  . Hypromellose (ARTIFICIAL TEARS OP) Apply 1 drop to eye 2 (two) times daily as needed (dry eyes).    Marland Kitchen levothyroxine (SYNTHROID, LEVOTHROID) 25 MCG tablet Take 25 mcg by mouth daily before breakfast.     . losartan (COZAAR) 100 MG tablet Take 100 mg by mouth daily.    . Multiple Vitamins-Minerals (PRESERVISION/LUTEIN) CAPS Take 1 capsule by mouth 2 (two) times daily.    . nebivolol (BYSTOLIC) 5 MG tablet Take 1 tablet (5 mg total) by mouth daily. 30 tablet 6  . nitroGLYCERIN (NITROSTAT) 0.4 MG SL tablet Place 1 tablet (0.4 mg total) under the tongue every 5 (five) minutes as needed for chest pain. 25 tablet 3  . potassium chloride SA (K-DUR,KLOR-CON) 20  MEQ tablet Take 20 mEq by mouth daily.     . vitamin B-12 (CYANOCOBALAMIN) 1000 MCG tablet Take 1,000 mcg by mouth daily.     No current facility-administered medications for this visit.     Allergies:   Chlorpheniramine; Codeine; Nystatin; and Amlodipine    Social History:  The patient  reports that she has never smoked. She has never used smokeless tobacco. She reports that she drinks alcohol.   Family History:  The patient's family history includes CAD in her father; CVA in her sister; Heart attack (age of onset: 41) in her father.    ROS:  Please see the history of present illness.   Otherwise, review of systems are positive for leg swelling- managed with leg elevation and compression stockings.   All other systems are reviewed and negative.    PHYSICAL EXAM: VS:  BP 112/62   Pulse 62   Ht 5\' 6"  (1.676 m)   Wt 231 lb 12.8 oz (105.1 kg)   SpO2 98%   BMI 37.41 kg/m  , BMI Body mass index is 37.41 kg/m. GEN: Well nourished, well developed, in no acute distress  HEENT: normal  Neck: no JVD, carotid bruits, or masses Cardiac: RRR; no murmurs, rubs, or gallops,bilsateral 1+ edema  Respiratory:  clear to auscultation bilaterally, normal work of breathing GI: soft, nontender, nondistended, + BS MS: no deformity or atrophy  Skin: warm and dry, no rash Neuro:  Strength and sensation are intact Psych: euthymic mood, full affect   EKG:   The ekg ordered today demonstrates LBBB   Recent Labs: 07/27/2017: ALT 9 12/09/2017: TSH 3.44 01/25/2018: BUN 12; Creatinine 0.6; Hemoglobin 12.0; Platelets 219; Potassium 4.4; Sodium 139   Lipid Panel    Component Value Date/Time   CHOL 214 (A) 07/27/2017 1342   TRIG 104 07/27/2017 1342   HDL 60 07/27/2017 1342   LDLCALC 134 07/27/2017 1342     Other studies Reviewed: Additional studies/ records that were reviewed today with results demonstrating: cath results reviewed, labs reviewed.   ASSESSMENT AND PLAN:  1. CAD/Old MI: No angina  with medical therapy.  Continue aggressive secondary prevention. 2. Hyperlipidemia: LDL above target.  Intolerant of statins.  Would not pursue PCSK9 inhibitor given her other comorbidities including dementia 3. Pacer: Following up with Dr. Lovena Le. 4. HTN:The current medical regimen is effective;  continue present plan and medications. 5. Chronic diastolic heart failure: We will order furosemide 40 mg daily as needed for leg swelling.  She may need to use this up to 1-2 times  a week to help reduce the swelling. 6. C discussed with the patient regarding her CODE STATUS.  She has decided in the past she wanted to be DNR and reconfirmed this decision today.   Current medicines are reviewed at length with the patient today.  The patient concerns regarding her medicines were addressed.  The following changes have been made:  No change  Labs/ tests ordered today include:  No orders of the defined types were placed in this encounter.   Recommend 150 minutes/week of aerobic exercise Low fat, low carb, high fiber diet recommended  Disposition:   FU in 1 year   Signed, Larae Grooms, MD  03/22/2018 1:14 PM    [facility] [address], [place], [place]  [address] Phone: [phone]; Fax: [phone]

## 2018-03-22 ENCOUNTER — Encounter: Payer: Self-pay | Admitting: Internal Medicine

## 2018-03-22 ENCOUNTER — Encounter: Payer: Self-pay | Admitting: Interventional Cardiology

## 2018-03-22 ENCOUNTER — Ambulatory Visit (INDEPENDENT_AMBULATORY_CARE_PROVIDER_SITE_OTHER): Payer: Medicare Other | Admitting: Interventional Cardiology

## 2018-03-22 ENCOUNTER — Ambulatory Visit (INDEPENDENT_AMBULATORY_CARE_PROVIDER_SITE_OTHER): Payer: Medicare Other | Admitting: Internal Medicine

## 2018-03-22 VITALS — Ht 66.0 in

## 2018-03-22 VITALS — BP 112/62 | HR 62 | Ht 66.0 in | Wt 231.8 lb

## 2018-03-22 DIAGNOSIS — I5032 Chronic diastolic (congestive) heart failure: Secondary | ICD-10-CM | POA: Diagnosis not present

## 2018-03-22 DIAGNOSIS — I252 Old myocardial infarction: Secondary | ICD-10-CM

## 2018-03-22 DIAGNOSIS — Z950 Presence of cardiac pacemaker: Secondary | ICD-10-CM

## 2018-03-22 DIAGNOSIS — E782 Mixed hyperlipidemia: Secondary | ICD-10-CM

## 2018-03-22 DIAGNOSIS — I2510 Atherosclerotic heart disease of native coronary artery without angina pectoris: Secondary | ICD-10-CM | POA: Diagnosis not present

## 2018-03-22 DIAGNOSIS — I4430 Unspecified atrioventricular block: Secondary | ICD-10-CM

## 2018-03-22 DIAGNOSIS — I10 Essential (primary) hypertension: Secondary | ICD-10-CM

## 2018-03-22 DIAGNOSIS — I6523 Occlusion and stenosis of bilateral carotid arteries: Secondary | ICD-10-CM

## 2018-03-22 MED ORDER — FUROSEMIDE 40 MG PO TABS: 40.0000 mg | ORAL_TABLET | Freq: Every day | ORAL | 3 refills | Status: DC | PRN

## 2018-03-22 MED ORDER — NITROGLYCERIN 0.4 MG SL SUBL: 0.4000 mg | SUBLINGUAL_TABLET | SUBLINGUAL | 3 refills | Status: DC | PRN

## 2018-03-22 NOTE — Addendum Note (Signed)
Addended by: Drue Novel I on: 03/22/2018 03:34 PM   Modules accepted: Orders

## 2018-03-22 NOTE — Patient Instructions (Signed)
Medication Instructions:  Your physician recommends that you continue on your current medications as directed. Please refer to the Current Medication list given to you today.  If you need a refill on your cardiac medications before your next appointment, please call your pharmacy.   Lab work: None ordered.  If you have labs (blood work) drawn today and your tests are completely normal, you will receive your results only by: Marland Kitchen MyChart Message (if you have MyChart) OR . A paper copy in the mail If you have any lab test that is abnormal or we need to change your treatment, we will call you to review the results.  Testing/Procedures: None ordered.  Follow-Up:  Your physician wants you to follow-up in: one year with Dr. Lovena Le.    You will receive a reminder letter in the mail two months in advance. If you don't receive a letter, please call our office to schedule the follow-up appointment.  Remote monitoring is used to monitor your Pacemaker from home. This monitoring reduces the number of office visits required to check your device to one time per year. It allows [place] to keep an eye on the functioning of your device to ensure it is working properly. You are scheduled for a device check from home on 05/03/2018. You may send your transmission at any time that day. If you have a wireless device, the transmission will be sent automatically. After your physician reviews your transmission, you will receive a postcard with your next transmission date.  At [facility], you and your health needs are our priority.  As part of our continuing mission to provide you with exceptional heart care, we have created designated Provider Care Teams.  These Care Teams include your primary Cardiologist (physician) and Advanced Practice Providers (APPs -  Physician Assistants and Nurse Practitioners) who all work together to provide you with the care you need, when you need it.  Any Other Special Instructions Will Be  Listed Below (If Applicable).

## 2018-03-22 NOTE — Patient Instructions (Signed)
Medication Instructions:  Your physician recommends that you continue on your current medications as directed. Please refer to the Current Medication list given to you today.  Take lasix 40 mg daily AS NEEDED for swelling or weight gain  A refill was sent in for Sublingual Nitroglycerin to your pharmacy  If you need a refill on your cardiac medications before your next appointment, please call your pharmacy.   Lab work: None Ordered  If you have labs (blood work) drawn today and your tests are completely normal, you will receive your results only by: Marland Kitchen MyChart Message (if you have MyChart) OR . A paper copy in the mail If you have any lab test that is abnormal or we need to change your treatment, we will call you to review the results.  Testing/Procedures: None ordered  Follow-Up: At [facility], you and your health needs are our priority.  As part of our continuing mission to provide you with exceptional heart care, we have created designated Provider Care Teams.  These Care Teams include your primary Cardiologist (physician) and Advanced Practice Providers (APPs -  Physician Assistants and Nurse Practitioners) who all work together to provide you with the care you need, when you need it. . You will need a follow up appointment in 1 year.  Please call our office 2 months in advance to schedule this appointment.  You may see Casandra Doffing, MD or one of the following Advanced Practice Providers on your designated Care Team:   . Lyda Jester, PA-C . Dayna Bushway, PA-C . Ermalinda Barrios, PA-C  Any Other Special Instructions Will Be Listed Below (If Applicable).

## 2018-03-22 NOTE — Progress Notes (Signed)
HPI [name] returns today for followup of her PPM. She is a pleasant 84 yo woman with symptomatic bradycardia and 2:1 AV block and LBBB who underwent PPM insertion approximately 4 years ago. In the interim, she has had no recurrent syncope. She denies chest pain or sob. She has been bothered by peripheral edema. No other complaints today. She is now on lasix as needed.  Allergies  Allergen Reactions  . Chlorpheniramine Anaphylaxis  . Codeine Anaphylaxis  . Nystatin Rash    redness  . Amlodipine     Per [facility] Records      Current Outpatient Medications  Medication Sig Dispense Refill  . aspirin EC 81 MG tablet Take 81 mg by mouth daily.    . chlorhexidine (HIBICLENS) 4 % external liquid Apply 1 application topically 2 (two) times a week.    . Cholecalciferol (VITAMIN D3) 2000 UNITS TABS Take 3,000 Units by mouth daily.     . Coenzyme Q10 (COQ10) 200 MG CAPS Take 1 tablet by mouth daily.    Marland Kitchen donepezil (ARICEPT) 5 MG tablet Take 5 mg by mouth at bedtime.    . DULoxetine (CYMBALTA) 60 MG capsule Take 60 mg by mouth daily.    . Hypromellose (ARTIFICIAL TEARS OP) Apply 1 drop to eye 2 (two) times daily as needed (dry eyes).    Marland Kitchen levothyroxine (SYNTHROID, LEVOTHROID) 25 MCG tablet Take 25 mcg by mouth daily before breakfast.     . losartan (COZAAR) 100 MG tablet Take 100 mg by mouth daily.    . Multiple Vitamins-Minerals (PRESERVISION/LUTEIN) CAPS Take 1 capsule by mouth 2 (two) times daily.    . nebivolol (BYSTOLIC) 5 MG tablet Take 1 tablet (5 mg total) by mouth daily. 30 tablet 6  . potassium chloride SA (K-DUR,KLOR-CON) 20 MEQ tablet Take 20 mEq by mouth daily.     . vitamin B-12 (CYANOCOBALAMIN) 1000 MCG tablet Take 1,000 mcg by mouth daily.    . furosemide (LASIX) 40 MG tablet Take 1 tablet (40 mg total) by mouth daily as needed (swelling or weight gain). 90 tablet 3  . nitroGLYCERIN (NITROSTAT) 0.4 MG SL tablet Place 1 tablet (0.4 mg total) under the tongue every  5 (five) minutes as needed for chest pain. 25 tablet 3   No current facility-administered medications for this visit.      Past Medical History:  Diagnosis Date  . Breast cancer ([place])   . Colon polyps   . Coronary artery disease 06/2006   STEMI inferior with BMS to mid RCA with mid basal inferior wall HK and luminal irrgularities elsewhere  . Fracture of femoral neck, right ([place]) 06/13/2011  . Fracture of humeral head, right, closed 06/13/2011  . GERD (gastroesophageal reflux disease)   . Hemorrhoids   . Hyperlipemia   . Hypertension   . LBBB (left bundle branch block)   . Macular degeneration   . Myocardial infarction ([place])   . Obstructive sleep apnea on CPAP   . Osteopenia   . Osteopenia   . Pacemaker 02/25/14  . Rhinitis   . Rotator cuff syndrome   . SAH (subarachnoid hemorrhage) ([place]) 02/22/14  . 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 Ulnar neuropathy   . Umbilical hernia   . Unstable gait     ROS:   All systems reviewed and negative except as noted in the HPI.   Past Surgical History:  Procedure Laterality Date  . BREAST LUMPECTOMY    . CATARACT EXTRACTION,  BILATERAL     Per [facility]  . CORONARY ANGIOPLASTY WITH STENT PLACEMENT  06/2006   Mid RCA BMS  . HIP ARTHROPLASTY  06/06/2011   Procedure: ARTHROPLASTY BIPOLAR HIP;  Surgeon: Gearlean Alf;  Location: [facility];  Service: Orthopedics;  Laterality: Right;  . PERMANENT PACEMAKER INSERTION N/A 02/25/2014   Procedure: PERMANENT PACEMAKER INSERTION;  Surgeon: Evans Lance, MD;  Location: [facility] C[facility] LAB;  Service: Cardiovascular;  Laterality: N/A;  . SHOULDER SURGERY    . TONSILLECTOMY     Per [facility]   . WRIST SURGERY       Family History  Problem Relation Age of Onset  . Heart attack Father 23  . CAD Father   . CVA Sister      Social History   Socioeconomic History  . Marital status: Married    Spouse name: Not on file  . Number of children: Not on file  . Years of education:  Not on file  . Highest education level: Not on file  Occupational History  . Not on file  Social Needs  . Financial resource strain: Not hard at all  . Food insecurity:    Worry: Never true    Inability: Never true  . Transportation needs:    Medical: No    Non-medical: No  Tobacco Use  . Smoking status: Never Smoker  . Smokeless tobacco: Never Used  Substance and Sexual Activity  . Alcohol use: Yes    Alcohol/week: 0.0 standard drinks    Comment: occasional wine  . Drug use: Not on file  . Sexual activity: Not on file  Lifestyle  . Physical activity:    Days per week: 0 days    Minutes per session: 0 min  . Stress: Only a little  Relationships  . Social connections:    Talks on phone: More than three times a week    Gets together: More than three times a week    Attends religious service: Never    Active member of club or organization: No    Attends meetings of clubs or organizations: Never    Relationship status: Married  . Intimate partner violence:    Fear of current or ex partner: No    Emotionally abused: No    Physically abused: No    Forced sexual activity: No  Other Topics Concern  . Not on file  Social History Narrative  . Not on file     Ht 5\' 6"  (1.676 m)   BMI 37.41 kg/m   Physical Exam:  Well appearing NAD HEENT: Unremarkable Neck:  No JVD, no thyromegally Lymphatics:  No adenopathy Back:  No CVA tenderness Lungs:  Clear with no wheezes HEART:  Regular rate rhythm, no murmurs, no rubs, no clicks Abd:  soft, positive bowel sounds, no organomegally, no rebound, no guarding Ext:  2 plus pulses, no edema, no cyanosis, no clubbing Skin:  No rashes no nodules Neuro:  CN II through XII intact, motor grossly intact  EKG - nsr with ventricular pacing  DEVICE  Normal device function.  See PaceArt for details.   Assess/Plan: 1. CHB - she has no escape at 32 today. She is asymptomatic, s/p PPM. 2. PPM - her [employer] DDD PM is working normally.    3. Weight gain - she has gained over 20 lbs in the past couple of years. 4. HTN - her blood pressure otay was well controlled. We will follow.  Carleene Overlie  Marlei Glomski,M.D.

## 2018-04-03 ENCOUNTER — Encounter: Payer: Self-pay | Admitting: Cardiology

## 2018-04-24 ENCOUNTER — Telehealth: Payer: Self-pay | Admitting: Cardiology

## 2018-04-24 ENCOUNTER — Ambulatory Visit: Payer: Medicare Other | Admitting: Cardiology

## 2018-04-24 LAB — CUP PACEART INCLINIC DEVICE CHECK
Battery Remaining Longevity: 116 mo
Date Time Interrogation Session: 20191030164816
Implantable Lead Implant Date: 20151005
Implantable Lead Implant Date: 20151005
Implantable Lead Location: 753860
Implantable Pulse Generator Implant Date: 20151005
Lead Channel Pacing Threshold Amplitude: 1 V
Lead Channel Pacing Threshold Pulse Width: 0.5 ms
Lead Channel Sensing Intrinsic Amplitude: 6 mV
Lead Channel Setting Pacing Amplitude: 1.875
Lead Channel Setting Sensing Sensitivity: 4 mV
MDC IDC LEAD LOCATION: 753859
MDC IDC MSMT BATTERY VOLTAGE: 2.98 V
MDC IDC MSMT LEADCHNL RA IMPEDANCE VALUE: 437.5 Ohm
MDC IDC MSMT LEADCHNL RA PACING THRESHOLD AMPLITUDE: 1 V
MDC IDC MSMT LEADCHNL RA SENSING INTR AMPL: 5 mV
MDC IDC MSMT LEADCHNL RV IMPEDANCE VALUE: 587.5 Ohm
MDC IDC MSMT LEADCHNL RV PACING THRESHOLD PULSEWIDTH: 0.5 ms
MDC IDC SET LEADCHNL RV PACING AMPLITUDE: 1.125
MDC IDC SET LEADCHNL RV PACING PULSEWIDTH: 0.5 ms
MDC IDC STAT BRADY RA PERCENT PACED: 61 %
MDC IDC STAT BRADY RV PERCENT PACED: 93 %
Pulse Gen Model: 2240
Pulse Gen Serial Number: 7666288

## 2018-04-24 NOTE — Telephone Encounter (Signed)
  Mr Wiatrek called to reschedule his mom's sleep appt. The patient was unable to come today because she is in rehab but she is overdue for her sleep appt. I did not see anything available through March. Son would like to know if there would be any way to work her in sometime in February.

## 2018-04-26 ENCOUNTER — Encounter: Payer: Medicare Other | Admitting: Internal Medicine

## 2018-05-02 NOTE — Telephone Encounter (Signed)
Reached out to [employer] per dpr about rescheduling an overdue appointment for his mother. I informed Jed that the doctors schedule was full through March but I would ask doctor Turners scheduler Altha Harm to add the patient to the cancellation list so that maybe she could get an appointment sooner than the end of march. Jed is aware and agreeable to that treatment.

## 2018-05-03 ENCOUNTER — Telehealth: Payer: Self-pay | Admitting: Cardiology

## 2018-05-03 NOTE — Telephone Encounter (Signed)
LMOVM reminding pt to send remote transmission.   

## 2018-05-05 ENCOUNTER — Encounter: Payer: Self-pay | Admitting: Cardiology

## 2018-05-08 ENCOUNTER — Ambulatory Visit (INDEPENDENT_AMBULATORY_CARE_PROVIDER_SITE_OTHER): Payer: Medicare Other

## 2018-05-08 DIAGNOSIS — I4430 Unspecified atrioventricular block: Secondary | ICD-10-CM

## 2018-05-08 NOTE — Progress Notes (Signed)
Remote pacemaker transmission.   

## 2018-06-01 NOTE — Telephone Encounter (Signed)
Patient has an overdue follow up appointment scheduled for 06/30/2018. Patient/Son was grateful for the call and thanked me.

## 2018-06-14 ENCOUNTER — Non-Acute Institutional Stay: Payer: Medicare Other | Admitting: Internal Medicine

## 2018-06-14 ENCOUNTER — Encounter: Payer: Self-pay | Admitting: Internal Medicine

## 2018-06-14 VITALS — BP 140/80 | HR 63 | Temp 98.5°F | Ht 66.0 in | Wt 227.0 lb

## 2018-06-14 DIAGNOSIS — R3981 Functional urinary incontinence: Secondary | ICD-10-CM | POA: Diagnosis not present

## 2018-06-14 DIAGNOSIS — Z9989 Dependence on other enabling machines and devices: Secondary | ICD-10-CM

## 2018-06-14 DIAGNOSIS — M810 Age-related osteoporosis without current pathological fracture: Secondary | ICD-10-CM

## 2018-06-14 DIAGNOSIS — I2510 Atherosclerotic heart disease of native coronary artery without angina pectoris: Secondary | ICD-10-CM

## 2018-06-14 DIAGNOSIS — G4733 Obstructive sleep apnea (adult) (pediatric): Secondary | ICD-10-CM

## 2018-06-14 DIAGNOSIS — I872 Venous insufficiency (chronic) (peripheral): Secondary | ICD-10-CM

## 2018-06-14 DIAGNOSIS — F329 Major depressive disorder, single episode, unspecified: Secondary | ICD-10-CM | POA: Diagnosis not present

## 2018-06-14 DIAGNOSIS — F0150 Vascular dementia without behavioral disturbance: Secondary | ICD-10-CM

## 2018-06-14 NOTE — Progress Notes (Signed)
Location:  Occupational psychologist of Service:  Clinic (12)  Provider: Fantasia Jinkins L. Mariea Clonts, D.O., C.M.D.  Code Status: DNR Goals of Care:  Advanced Directives 02/08/2018  Does Patient Have a Medical Advance Directive? Yes  Type of Paramedic of [place];Out of facility DNR (pink MOST or yellow form)  Does patient want to make changes to medical advance directive? No - Patient declined  Copy of [place] in Chart? Yes  Pre-existing out of facility DNR order (yellow form or pink MOST form) Yellow form placed in chart (order not valid for inpatient use)   Chief Complaint  Patient presents with  . Medical Management of Chronic Issues    61mth follow-up    HPI: Patient is a 85 y.o. female with dementia, depression, venous insufficiency, prior MI/CAD with pacemaker after a syncopal episode, morbid obesity aortic insufficiency, htn, OSA on CPAP, prior SAH with a traumatic fall, osteoporosis, unsteady gait (uses rollator), urinary incontinence, breast cancer and b12 deficiency seen today for medical management of chronic diseases.  Once of her friends passed away and she is in need of grief counseling.  She also wishes she and her husband could live in the same apt.  They are receiving two different levels of care.    She reports no medical concerns, only psychological ones.  She reports that she and her husband plan to [place] [facility] to [place] their 67th wedding anniversary in [place] but neither have a car so they don't know how they'll do it.    States she would like to be able to ride the exercise bike up in [place] rather than having to go over to the fitness center.  She thinks others would take advantage of it if it was more readily available in [place].  Past Medical History:  Diagnosis Date  . Breast cancer ([place])   . Colon polyps   . Coronary artery disease 06/2006   STEMI inferior with BMS to mid RCA with mid basal inferior  wall HK and luminal irrgularities elsewhere  . Fracture of femoral neck, right ([place]) 06/13/2011  . Fracture of humeral head, right, closed 06/13/2011  . GERD (gastroesophageal reflux disease)   . Hemorrhoids   . Hyperlipemia   . Hypertension   . LBBB (left bundle branch block)   . Macular degeneration   . Myocardial infarction ([place])   . Obstructive sleep apnea on CPAP   . Osteopenia   . Osteopenia   . Pacemaker 02/25/14  . Rhinitis   . Rotator cuff syndrome   . SAH (subarachnoid hemorrhage) ([place]) 02/22/14  . 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 Ulnar neuropathy   . Umbilical hernia   . Unstable gait     Past Surgical History:  Procedure Laterality Date  . BREAST LUMPECTOMY    . CATARACT EXTRACTION, BILATERAL     Per [facility]  . CORONARY ANGIOPLASTY WITH STENT PLACEMENT  06/2006   Mid RCA BMS  . HIP ARTHROPLASTY  06/06/2011   Procedure: ARTHROPLASTY BIPOLAR HIP;  Surgeon: Gearlean Alf;  Location: [facility];  Service: Orthopedics;  Laterality: Right;  . PERMANENT PACEMAKER INSERTION N/A 02/25/2014   Procedure: PERMANENT PACEMAKER INSERTION;  Surgeon: Evans Lance, MD;  Location: [facility] C[facility] LAB;  Service: Cardiovascular;  Laterality: N/A;  . SHOULDER SURGERY    . TONSILLECTOMY     Per [facility]   . WRIST SURGERY      Allergies  Allergen Reactions  .  Chlorpheniramine Anaphylaxis  . Codeine Anaphylaxis  . Nystatin Rash    redness  . Amlodipine     Per [facility]     Outpatient Encounter Medications as of 06/14/2018  Medication Sig  . aspirin EC 81 MG tablet Take 81 mg by mouth daily.  . chlorhexidine (HIBICLENS) 4 % external liquid Apply 1 application topically 2 (two) times a week.  . Cholecalciferol (VITAMIN D3) 2000 UNITS TABS Take 3,000 Units by mouth daily.   . Coenzyme Q10 (COQ10) 200 MG CAPS Take 1 tablet by mouth daily.  Marland Kitchen donepezil (ARICEPT) 5 MG tablet Take 5 mg by mouth at bedtime.  . DULoxetine (CYMBALTA) 60 MG capsule Take  60 mg by mouth daily.  . furosemide (LASIX) 40 MG tablet Take 1 tablet (40 mg total) by mouth daily as needed (swelling or weight gain).  . Hypromellose (ARTIFICIAL TEARS OP) Apply 1 drop to eye 2 (two) times daily as needed (dry eyes).  Marland Kitchen levothyroxine (SYNTHROID, LEVOTHROID) 25 MCG tablet Take 25 mcg by mouth daily before breakfast.   . losartan (COZAAR) 100 MG tablet Take 100 mg by mouth daily.  . Multiple Vitamins-Minerals (PRESERVISION/LUTEIN) CAPS Take 1 capsule by mouth 2 (two) times daily.  . nebivolol (BYSTOLIC) 5 MG tablet Take 1 tablet (5 mg total) by mouth daily.  . nitroGLYCERIN (NITROSTAT) 0.4 MG SL tablet Place 1 tablet (0.4 mg total) under the tongue every 5 (five) minutes as needed for chest pain.  . potassium chloride SA (K-DUR,KLOR-CON) 20 MEQ tablet Take 20 mEq by mouth daily.   . vitamin B-12 (CYANOCOBALAMIN) 1000 MCG tablet Take 1,000 mcg by mouth daily.   No facility-administered encounter medications on file as of 06/14/2018.     Review of Systems:  Review of Systems  Constitutional: Negative for chills and fever.  HENT: Negative for hearing loss.   Eyes: Negative for blurred vision.       Reading glasses  Respiratory: Negative for cough and shortness of breath.   Cardiovascular: Positive for leg swelling. Negative for chest pain and palpitations.       Uses compression hose  Gastrointestinal: Negative for abdominal pain, blood in stool, constipation, melena, nausea and vomiting.  Genitourinary: Positive for urgency. Negative for dysuria, frequency and hematuria.       Incontinence  Musculoskeletal: Negative for falls and myalgias.       Walks with rollator walker; staff brought her in transport wheelchair today  Skin: Negative for itching and rash.  Neurological: Negative for loss of consciousness and headaches.  Endo/Heme/Allergies: Bruises/bleeds easily.  Psychiatric/Behavioral: Positive for memory loss. Negative for depression. The patient is not  nervous/anxious and does not have insomnia.     Health Maintenance  Topic Date Due  . TETANUS/TDAP  01/17/2023  . INFLUENZA VACCINE  Completed  . DEXA SCAN  Completed  . PNA vac Low Risk Adult  Completed    Physical Exam: Vitals:   06/14/18 1137  BP: 140/80  Pulse: 63  Temp: 98.5 F (36.9 C)  TempSrc: Oral  SpO2: 98%  Weight: 227 lb (103 kg)  Height: 5\' 6"  (1.676 m)   Body mass index is 36.64 kg/m. Physical Exam Vitals signs and nursing note reviewed.  Constitutional:      Appearance: Normal appearance. She is obese.  HENT:     Head: Normocephalic and atraumatic.  Cardiovascular:     Rate and Rhythm: Normal rate and regular rhythm.     Heart sounds: Murmur present.  Pulmonary:  Effort: Pulmonary effort is normal.     Breath sounds: Normal breath sounds. No rales.  Abdominal:     General: Bowel sounds are normal.     Palpations: Abdomen is soft.  Musculoskeletal:     Right lower leg: Edema present.     Left lower leg: Edema present.  Skin:    General: Skin is warm and dry.     Capillary Refill: Capillary refill takes less than 2 seconds.  Neurological:     General: No focal deficit present.     Mental Status: She is alert.     Comments: Oriented to person, place, but not time and repeats questions some, talks a lot about the past  Psychiatric:        Mood and Affect: Mood normal.     Labs reviewed: Basic Metabolic Panel: Recent Labs    07/27/17 1342 07/28/17 0700 12/09/17 01/25/18  NA 135* 136*  --  139  K 4.5 4.2  --  4.4  BUN 13 14  --  12  CREATININE 0.7 0.5  --  0.6  TSH 2.84 4.05 3.44  --    Liver Function Tests: Recent Labs    07/27/17 1342  AST 17  ALT 9  ALKPHOS 106   No results for input(s): LIPASE, AMYLASE in the last 8760 hours. No results for input(s): AMMONIA in the last 8760 hours. CBC: Recent Labs    07/27/17 1342 07/28/17 0700 01/25/18  WBC 8.0 6.8 7.4  NEUTROABS  --   --  4  HGB 12.7 13.0 12.0  HCT 38 39 36  PLT  247 210 219   Lipid Panel: Recent Labs    07/27/17 1342  CHOL 214*  HDL 60  LDLCALC 134  TRIG 104   No results found for: HGBA1C  Procedures since last visit: No results found.  Assessment/Plan 1. Vascular dementia without behavioral disturbance ([place]) -gradually progressive (may have mix with AD), but certainly has vascular risks -cont [place] support, aricept  2. Reactive depression -cont cymbalta therapy, spirits seem improved   3. Functional urinary incontinence -ongoing, CNAs have and nursing helped with getting her regimen under better control, not currently struggling with vaginal infections, yeast, etc. -pt reports remaining sexually active with her husband though they have separate apts  4. Venous insufficiency -elevate feet at rest and use compression hose  5. Atherosclerosis of native coronary artery of native heart without angina pectoris -no recent difficulty, has not used ntg, cont secondary MI prevention  6. Obstructive sleep apnea on CPAP -cont CPAP  7. Age-related osteoporosis without current pathological fracture -continues on vitamin D; need to address potential medication treatment considering prior fractures  Labs/tests ordered:  No new Next appt:  Visit date not found  Jarrius Huaracha L. Albi Rappaport, D.O. [facility] [address], [place] [address] Cell Phone (Mon-Fri 8am-5pm):  [phone] On Call:  [phone] & follow prompts after 5pm & weekends Office Phone:  [phone] Office Fax:  [phone]

## 2018-06-17 LAB — CUP PACEART REMOTE DEVICE CHECK
Battery Remaining Longevity: 121 mo
Battery Remaining Percentage: 95.5 %
Brady Statistic AP VS Percent: 1 %
Brady Statistic AS VP Percent: 45 %
Brady Statistic AS VS Percent: 1 %
Brady Statistic RA Percent Paced: 53 %
Brady Statistic RV Percent Paced: 99 %
Date Time Interrogation Session: 20191214074852
Implantable Lead Implant Date: 20151005
Implantable Lead Implant Date: 20151005
Implantable Lead Location: 753859
Implantable Lead Location: 753860
Implantable Pulse Generator Implant Date: 20151005
Lead Channel Impedance Value: 440 Ohm
Lead Channel Pacing Threshold Amplitude: 0.75 V
Lead Channel Pacing Threshold Amplitude: 1 V
Lead Channel Pacing Threshold Pulse Width: 0.5 ms
Lead Channel Pacing Threshold Pulse Width: 0.5 ms
Lead Channel Sensing Intrinsic Amplitude: 5 mV
Lead Channel Sensing Intrinsic Amplitude: 5.6 mV
Lead Channel Setting Pacing Amplitude: 1 V
Lead Channel Setting Pacing Amplitude: 2 V
Lead Channel Setting Pacing Pulse Width: 0.5 ms
Lead Channel Setting Sensing Sensitivity: 4 mV
MDC IDC MSMT BATTERY VOLTAGE: 2.98 V
MDC IDC MSMT LEADCHNL RV IMPEDANCE VALUE: 560 Ohm
MDC IDC STAT BRADY AP VP PERCENT: 54 %
Pulse Gen Model: 2240
Pulse Gen Serial Number: 7666288

## 2018-06-30 ENCOUNTER — Encounter: Payer: Self-pay | Admitting: Cardiology

## 2018-06-30 ENCOUNTER — Ambulatory Visit (INDEPENDENT_AMBULATORY_CARE_PROVIDER_SITE_OTHER): Payer: Medicare Other | Admitting: Cardiology

## 2018-06-30 VITALS — BP 128/78 | HR 66 | Ht 66.0 in | Wt 220.0 lb

## 2018-06-30 DIAGNOSIS — E669 Obesity, unspecified: Secondary | ICD-10-CM | POA: Diagnosis not present

## 2018-06-30 DIAGNOSIS — I2510 Atherosclerotic heart disease of native coronary artery without angina pectoris: Secondary | ICD-10-CM

## 2018-06-30 DIAGNOSIS — G4733 Obstructive sleep apnea (adult) (pediatric): Secondary | ICD-10-CM

## 2018-06-30 DIAGNOSIS — Z9989 Dependence on other enabling machines and devices: Secondary | ICD-10-CM

## 2018-06-30 DIAGNOSIS — I10 Essential (primary) hypertension: Secondary | ICD-10-CM | POA: Diagnosis not present

## 2018-06-30 NOTE — Progress Notes (Signed)
Cardiology Office Note:    Date:  06/30/2018   ID:  [name], DOB Oct 09, 1928, MRN [id number]  PCP:  Gayland Curry, DO  Cardiologist:  No primary care provider on file.    Referring MD: Gayland Curry, DO   Chief Complaint  Patient presents with  . Sleep Apnea  . Hypertension    History of Present Illness:    [name] is a 85 y.o. female with a hx of OSA, obesity and HTN.  Swhe is doing well with her CPAP device and thinks that she has gotten used to it.  She tolerates the mask and feels the pressure is adequate.  Since going on CPAP she feels rested in the am and has no significant daytime sleepiness.  She denies any significant mouth or nasal dryness or nasal congestion.  She does not think that he snores.     Past Medical History:  Diagnosis Date  . Breast cancer ([place])   . Colon polyps   . Coronary artery disease 06/2006   STEMI inferior with BMS to mid RCA with mid basal inferior wall HK and luminal irrgularities elsewhere  . Fracture of femoral neck, right ([place]) 06/13/2011  . Fracture of humeral head, right, closed 06/13/2011  . GERD (gastroesophageal reflux disease)   . Hemorrhoids   . Hyperlipemia   . Hypertension   . LBBB (left bundle branch block)   . Macular degeneration   . Myocardial infarction ([place])   . Obstructive sleep apnea on CPAP   . Osteopenia   . Osteopenia   . Pacemaker 02/25/14  . Rhinitis   . Rotator cuff syndrome   . SAH (subarachnoid hemorrhage) ([place]) 02/22/14  . 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 Ulnar neuropathy   . Umbilical hernia   . Unstable gait     Past Surgical History:  Procedure Laterality Date  . BREAST LUMPECTOMY    . CATARACT EXTRACTION, BILATERAL     Per [facility]  . CORONARY ANGIOPLASTY WITH STENT PLACEMENT  06/2006   Mid RCA BMS  . HIP ARTHROPLASTY  06/06/2011   Procedure: ARTHROPLASTY BIPOLAR HIP;  Surgeon: Gearlean Alf;  [facility]: [facility];  Service: Orthopedics;  Laterality: Right;  .  PERMANENT PACEMAKER INSERTION N/A 02/25/2014   Procedure: PERMANENT PACEMAKER INSERTION;  Surgeon: Evans Lance, MD;  Location: [facility] CATH LAB;  Service: Cardiovascular;  Laterality: N/A;  . SHOULDER SURGERY    . TONSILLECTOMY     Per [facility]   . WRIST SURGERY      Current Medications: Current Meds  Medication Sig  . aspirin EC 81 MG tablet Take 81 mg by mouth daily.  . Cholecalciferol (VITAMIN D3) 2000 UNITS TABS Take 3,000 Units by mouth daily.   . Coenzyme Q10 (COQ10) 200 MG CAPS Take 1 tablet by mouth daily.  Marland Kitchen donepezil (ARICEPT) 5 MG tablet Take 5 mg by mouth at bedtime.  . DULoxetine (CYMBALTA) 60 MG capsule Take 60 mg by mouth daily.  . furosemide (LASIX) 40 MG tablet Take 1 tablet (40 mg total) by mouth daily as needed (swelling or weight gain).  Marland Kitchen levothyroxine (SYNTHROID, LEVOTHROID) 25 MCG tablet Take 25 mcg by mouth daily before breakfast.   . losartan (COZAAR) 100 MG tablet Take 100 mg by mouth daily.  . Multiple Vitamins-Minerals (PRESERVISION/LUTEIN) CAPS Take 1 capsule by mouth 2 (two) times daily.  . nebivolol (BYSTOLIC) 5 MG tablet Take 1 tablet (5 mg total) by mouth daily.  Marland Kitchen  nitroGLYCERIN (NITROSTAT) 0.4 MG SL tablet Place 1 tablet (0.4 mg total) under the tongue every 5 (five) minutes as needed for chest pain.  . potassium chloride SA (K-DUR,KLOR-CON) 20 MEQ tablet Take 20 mEq by mouth daily.   . vitamin B-12 (CYANOCOBALAMIN) 1000 MCG tablet Take 1,000 mcg by mouth daily.     Allergies:   Chlorpheniramine; Codeine; Nystatin; and Amlodipine   Social History   Socioeconomic History  . Marital status: Married    Spouse name: Not on file  . Number of children: Not on file  . Years of education: Not on file  . Highest education level: Not on file  Occupational History  . Not on file  Social Needs  . Financial resource strain: Not hard at all  . Food insecurity:    Worry: Never true    Inability: Never true  . Transportation needs:     Medical: No    Non-medical: No  Tobacco Use  . Smoking status: Never Smoker  . Smokeless tobacco: Never Used  Substance and Sexual Activity  . Alcohol use: Yes    Alcohol/week: 0.0 standard drinks    Comment: occasional wine  . Drug use: Never  . Sexual activity: Not on file  Lifestyle  . Physical activity:    Days per week: 0 days    Minutes per session: 0 min  . Stress: Only a little  Relationships  . Social connections:    Talks on phone: More than three times a week    Gets together: More than three times a week    Attends religious service: Never    Active member of club or organization: No    Attends meetings of clubs or organizations: Never    Relationship status: Married  Other Topics Concern  . Not on file  Social History Narrative  . Not on file     Family History: The patient's family history includes CAD in her father; CVA in her sister; Heart attack (age of onset: 33) in her father.  ROS:   Please see the history of present illness.    ROS  All other systems reviewed and negative.   EKGs/Labs/Other Studies Reviewed:    The following studies were reviewed today: PAP download  EKG:  EKG is not ordered today.  Recent Labs: 07/27/2017: ALT 9 12/09/2017: TSH 3.44 01/25/2018: BUN 12; Creatinine 0.6; Hemoglobin 12.0; Platelets 219; Potassium 4.4; Sodium 139   Recent Lipid Panel    Component Value Date/Time   CHOL 214 (A) 07/27/2017 1342   TRIG 104 07/27/2017 1342   HDL 60 07/27/2017 1342   LDLCALC 134 07/27/2017 1342    Physical Exam:    VS:  BP 128/78   Pulse 66   Ht 5\' 6"  (1.676 m)   Wt 220 lb (99.8 kg)   SpO2 95%   BMI 35.51 kg/m     Wt Readings from Last 3 Encounters:  06/30/18 220 lb (99.8 kg)  06/14/18 227 lb (103 kg)  03/22/18 231 lb 12.8 oz (105.1 kg)     GEN:  Well nourished, well developed in no acute distress HEENT: Normal NECK: No JVD; No carotid bruits LYMPHATICS: No lymphadenopathy CARDIAC: RRR, no murmurs, rubs,  gallops RESPIRATORY:  Clear to auscultation without rales, wheezing or rhonchi  ABDOMEN: Soft, non-tender, non-distended MUSCULOSKELETAL:  No edema; No deformity  SKIN: Warm and dry NEUROLOGIC:  Alert and oriented x 3 PSYCHIATRIC:  Normal affect   ASSESSMENT:    1. Obstructive sleep apnea  on CPAP   2. Essential hypertension   3. Obesity (BMI 30-39.9)    PLAN:    In order of problems listed above:  1.  OSA - the patient is tolerating PAP therapy well without any problems.   The patient has been using and benefiting from PAP use and will continue to benefit from therapy.  I attempted to download her ST card today but we could not get it to open up the fall.  I will have her son take her ST card to the DME to get a download.  2.  HTN - BP is well controlled on exam today.  She will continue on losartan 100 mg daily and Bystolic 5 mg daily.  3.  Obesity - I have encouraged her to get into a routine exercise program and cut back on carbs and portions.    Medication Adjustments/Labs and Tests Ordered: Current medicines are reviewed at length with the patient today.  Concerns regarding medicines are outlined above.  No orders of the defined types were placed in this encounter.  No orders of the defined types were placed in this encounter.   Signed, Fransico Him, MD  06/30/2018 11:49 AM    [place]

## 2018-06-30 NOTE — Patient Instructions (Signed)
Medication Instructions:  Your physician recommends that you continue on your current medications as directed. Please refer to the Current Medication list given to you today.  If you need a refill on your cardiac medications before your next appointment, please call your pharmacy.   Lab work: None If you have labs (blood work) drawn today and your tests are completely normal, you will receive your results only by: . MyChart Message (if you have MyChart) OR . A paper copy in the mail If you have any lab test that is abnormal or we need to change your treatment, we will call you to review the results.  Testing/Procedures: None  Follow-Up: At [facility], you and your health needs are our priority.  As part of our continuing mission to provide you with exceptional heart care, we have created designated Provider Care Teams.  These Care Teams include your primary Cardiologist (physician) and Advanced Practice Providers (APPs -  Physician Assistants and Nurse Practitioners) who all work together to provide you with the care you need, when you need it. You will need a follow up appointment in 1 years.  Please call our office 2 months in advance to schedule this appointment.  You may see Dr. Turner or one of the following Advanced Practice Providers on your designated Care Team:   Brittainy Simmons, PA-C Dayna Garner, PA-C . Michele Lenze, PA-C     

## 2018-07-03 ENCOUNTER — Telehealth: Payer: Self-pay | Admitting: *Deleted

## 2018-07-03 NOTE — Telephone Encounter (Signed)
Order placed to [facility] today. 

## 2018-07-03 NOTE — Telephone Encounter (Signed)
-----   Message from Sarina Ill, RN sent at 06/30/2018  5:21 PM EST ----- Regarding: Sleep Hello, Dr. Radford Pax ordered a new chin strap. Please send. Thanks, [employer]

## 2018-07-25 ENCOUNTER — Telehealth: Payer: Self-pay | Admitting: *Deleted

## 2018-07-25 NOTE — Telephone Encounter (Signed)
Pt lives in assisted living at [facility] and nursing and med techs there manage her medications through [employer].  Also, there are very few indications for antibiotic prophylaxis at this time--valve replacements and joint replacements that are within 72 months old.  I don't see where either of those apply to her.  I will notify the nursing supervisor over [place]

## 2018-07-25 NOTE — Telephone Encounter (Signed)
Patient daughter, Truman Hayward called and left message on Clinical Intake stating that patient is going to have a Dental Procedure and needs a Prophylactic Antibiotic called to pharmacy. Please Advise.   Pharmacy: [facility]

## 2018-08-07 ENCOUNTER — Ambulatory Visit (INDEPENDENT_AMBULATORY_CARE_PROVIDER_SITE_OTHER): Payer: Medicare Other | Admitting: *Deleted

## 2018-08-07 ENCOUNTER — Other Ambulatory Visit: Payer: Self-pay

## 2018-08-07 DIAGNOSIS — I4430 Unspecified atrioventricular block: Secondary | ICD-10-CM

## 2018-08-08 LAB — CUP PACEART REMOTE DEVICE CHECK
Battery Remaining Longevity: 121 mo
Battery Remaining Percentage: 95.5 %
Battery Voltage: 2.99 V
Brady Statistic AP VP Percent: 59 %
Brady Statistic AP VS Percent: 1 %
Brady Statistic AS VP Percent: 40 %
Brady Statistic AS VS Percent: 1 %
Brady Statistic RA Percent Paced: 58 %
Date Time Interrogation Session: 20200316060014
Implantable Lead Implant Date: 20151005
Implantable Lead Implant Date: 20151005
Implantable Lead Location: 753859
Implantable Lead Location: 753860
Implantable Pulse Generator Implant Date: 20151005
Lead Channel Impedance Value: 440 Ohm
Lead Channel Impedance Value: 580 Ohm
Lead Channel Pacing Threshold Amplitude: 0.75 V
Lead Channel Pacing Threshold Amplitude: 1 V
Lead Channel Pacing Threshold Pulse Width: 0.5 ms
Lead Channel Sensing Intrinsic Amplitude: 1.6 mV
Lead Channel Setting Pacing Amplitude: 1 V
Lead Channel Setting Pacing Amplitude: 2 V
Lead Channel Setting Pacing Pulse Width: 0.5 ms
Lead Channel Setting Sensing Sensitivity: 4 mV
MDC IDC MSMT LEADCHNL RA PACING THRESHOLD PULSEWIDTH: 0.5 ms
MDC IDC STAT BRADY RV PERCENT PACED: 99 %
Pulse Gen Model: 2240
Pulse Gen Serial Number: 7666288

## 2018-08-14 NOTE — Progress Notes (Signed)
Remote pacemaker transmission.   

## 2018-09-16 DIAGNOSIS — Z20828 Contact with and (suspected) exposure to other viral communicable diseases: Secondary | ICD-10-CM | POA: Diagnosis not present

## 2018-09-17 ENCOUNTER — Encounter: Payer: Self-pay | Admitting: Internal Medicine

## 2018-10-17 DIAGNOSIS — E785 Hyperlipidemia, unspecified: Secondary | ICD-10-CM | POA: Diagnosis not present

## 2018-10-17 DIAGNOSIS — E1365 Other specified diabetes mellitus with hyperglycemia: Secondary | ICD-10-CM | POA: Diagnosis not present

## 2018-10-17 DIAGNOSIS — E119 Type 2 diabetes mellitus without complications: Secondary | ICD-10-CM | POA: Diagnosis not present

## 2018-10-17 DIAGNOSIS — D649 Anemia, unspecified: Secondary | ICD-10-CM | POA: Diagnosis not present

## 2018-10-17 LAB — HEPATIC FUNCTION PANEL
ALT: 11 (ref 7–35)
AST: 20 (ref 13–35)
Alkaline Phosphatase: 126 — AB (ref 25–125)
Bilirubin, Total: 0.5

## 2018-10-17 LAB — LIPID PANEL
Cholesterol: 228 — AB (ref 0–200)
HDL: 48 (ref 35–70)
LDL Cholesterol: 140
Triglycerides: 201 — AB (ref 40–160)

## 2018-10-17 LAB — CBC AND DIFFERENTIAL
HCT: 39 (ref 36–46)
Hemoglobin: 13 (ref 12.0–16.0)
Platelets: 272 (ref 150–399)
WBC: 8.1

## 2018-10-17 LAB — BASIC METABOLIC PANEL
BUN: 14 (ref 4–21)
Creatinine: 0.8 (ref 0.5–1.1)
Glucose: 95
Potassium: 4.5 (ref 3.4–5.3)
Sodium: 143 (ref 137–147)

## 2018-10-17 LAB — HEMOGLOBIN A1C: Hemoglobin A1C: 6.1

## 2018-10-18 ENCOUNTER — Non-Acute Institutional Stay: Payer: Medicare Other | Admitting: Internal Medicine

## 2018-10-18 ENCOUNTER — Other Ambulatory Visit: Payer: Self-pay

## 2018-10-18 ENCOUNTER — Encounter: Payer: Self-pay | Admitting: Internal Medicine

## 2018-10-18 VITALS — BP 160/80 | HR 63 | Temp 98.4°F | Ht 66.0 in | Wt 233.0 lb

## 2018-10-18 DIAGNOSIS — I872 Venous insufficiency (chronic) (peripheral): Secondary | ICD-10-CM

## 2018-10-18 DIAGNOSIS — F0150 Vascular dementia without behavioral disturbance: Secondary | ICD-10-CM | POA: Diagnosis not present

## 2018-10-18 DIAGNOSIS — I119 Hypertensive heart disease without heart failure: Secondary | ICD-10-CM

## 2018-10-18 DIAGNOSIS — R635 Abnormal weight gain: Secondary | ICD-10-CM | POA: Diagnosis not present

## 2018-10-18 DIAGNOSIS — G4733 Obstructive sleep apnea (adult) (pediatric): Secondary | ICD-10-CM

## 2018-10-18 DIAGNOSIS — Z9989 Dependence on other enabling machines and devices: Secondary | ICD-10-CM

## 2018-10-18 DIAGNOSIS — I2510 Atherosclerotic heart disease of native coronary artery without angina pectoris: Secondary | ICD-10-CM

## 2018-10-18 DIAGNOSIS — F3341 Major depressive disorder, recurrent, in partial remission: Secondary | ICD-10-CM | POA: Diagnosis not present

## 2018-10-18 NOTE — Progress Notes (Signed)
Location:   [facility]   Place of Service:   clinic  Provider: Thana Ramp L. Mariea Clonts, D.O., C.M.D.  Code Status: DNR Goals of Care:  Advanced Directives 02/08/2018  Does Patient Have a Medical Advance Directive? Yes  Type of Paramedic of [place];Out of facility DNR (pink MOST or yellow form)  Does patient want to make changes to medical advance directive? No - Patient declined  Copy of [place] in Chart? Yes  Pre-existing out of facility DNR order (yellow form or pink MOST form) Yellow form placed in chart (order not valid for inpatient use)     Chief Complaint  Patient presents with  . Medical Management of Chronic Issues    75mth follow-up    HPI: Patient is a 85 y.o. female seen today for medical management of chronic diseases.  [place] due to increased ADL needs related to her dementia, depression, CAD with prior MI, pacemaker, morbid obesity, aortic insufficiency, OSA on CPAP, prior subarachnoid hemorrhage with traumatic fall, osteoporosis, unsteady gait (rollator walker), urinary incontinence, prior breast cancer and b12 deficiency.  Her husband's college roommate passed away and they just heard this morning.  Says they have lost so many friends.  Says that old friends are like an old warm quilt.   Says the parade helped a lot yesterday with resident's spirits.  Misses seeing her family and friends.  Her son is busy working.    BP up after walking down.  She was quite winded walking down here.  She admits she's not fit.  Her best thing she does for exercise is the mini bicycle wheels. She does it 30-45 mins daily while watching tv.  Reviewing her bps since feb, I see that she has several elevated in the 160s-170s.  Given her age, I would not push her bp terribly low but do want it to stay under 150/90 consistently as long as she does not get dizzy.  Reports news is not uplifting lately either.    She used to go to the  [facility].    She continues to talk about missing her women's church groups and retreats.    She talks about not having a car which neither would be at all safe to drive.  Walking is challenging for her and husband.    Weight is up 13 lbs since feb.  She's not been active.  She's walking less b/c of social isolation with covid.  When she was independent walking with her friend back when she lived at home.      She was wearing her compression hose.    Uses her cpap faithfully at night.  Says it's not bad to use and she easily acclimated to it.    Had labs yesterday or Friday.  She has a bruise on her right arm from it.  Cholesterol is sky high.  She has her history of CAD with stent, carotid stenosis, and it appears at one time she was on crestor 5mg  twice a week back in 2013 after her pacemaker placement.    Past Medical History:  Diagnosis Date  . Breast cancer ([place])   . Colon polyps   . Coronary artery disease 06/2006   STEMI inferior with BMS to mid RCA with mid basal inferior wall HK and luminal irrgularities elsewhere  . Fracture of femoral neck, right ([place]) 06/13/2011  . Fracture of humeral head, right, closed 06/13/2011  . GERD (gastroesophageal reflux disease)   .  Hemorrhoids   . Hyperlipemia   . Hypertension   . LBBB (left bundle branch block)   . Macular degeneration   . Myocardial infarction ([place])   . Obstructive sleep apnea on CPAP   . Osteopenia   . Osteopenia   . Pacemaker 02/25/14  . Rhinitis   . Rotator cuff syndrome   . SAH (subarachnoid hemorrhage) ([place]) 02/22/14  . 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 Ulnar neuropathy   . Umbilical hernia   . Unstable gait     Past Surgical History:  Procedure Laterality Date  . BREAST LUMPECTOMY    . CATARACT EXTRACTION, BILATERAL     Per [facility]  . CORONARY ANGIOPLASTY WITH STENT PLACEMENT  06/2006   Mid RCA BMS  . HIP ARTHROPLASTY  06/06/2011   Procedure: ARTHROPLASTY BIPOLAR HIP;   Surgeon: Gearlean Alf;  Location: [facility];  Service: Orthopedics;  Laterality: Right;  . PERMANENT PACEMAKER INSERTION N/A 02/25/2014   Procedure: PERMANENT PACEMAKER INSERTION;  Surgeon: Evans Lance, MD;  Location: [facility];  Service: Cardiovascular;  Laterality: N/A;  . SHOULDER SURGERY    . TONSILLECTOMY     Per [facility]   . WRIST SURGERY      Allergies  Allergen Reactions  . Chlorpheniramine Anaphylaxis  . Codeine Anaphylaxis  . Nystatin Rash    redness  . Amlodipine     Per [facility]     Outpatient Encounter Medications as of 10/18/2018  Medication Sig  . aspirin EC 81 MG tablet Take 81 mg by mouth daily.  . Cholecalciferol (VITAMIN D3) 2000 UNITS TABS Take 3,000 Units by mouth daily.   . Coenzyme Q10 (COQ10) 200 MG CAPS Take 1 tablet by mouth daily.  Marland Kitchen donepezil (ARICEPT) 5 MG tablet Take 5 mg by mouth at bedtime.  . DULoxetine (CYMBALTA) 60 MG capsule Take 60 mg by mouth daily.  . furosemide (LASIX) 40 MG tablet Take 1 tablet (40 mg total) by mouth daily as needed (swelling or weight gain).  Marland Kitchen levothyroxine (SYNTHROID, LEVOTHROID) 25 MCG tablet Take 25 mcg by mouth daily before breakfast.   . losartan (COZAAR) 100 MG tablet Take 100 mg by mouth daily.  . Multiple Vitamins-Minerals (PRESERVISION/LUTEIN) CAPS Take 1 capsule by mouth 2 (two) times daily.  . nebivolol (BYSTOLIC) 5 MG tablet Take 1 tablet (5 mg total) by mouth daily.  . nitroGLYCERIN (NITROSTAT) 0.4 MG SL tablet Place 1 tablet (0.4 mg total) under the tongue every 5 (five) minutes as needed for chest pain.  . potassium chloride SA (K-DUR,KLOR-CON) 20 MEQ tablet Take 20 mEq by mouth daily.   . vitamin B-12 (CYANOCOBALAMIN) 1000 MCG tablet Take 1,000 mcg by mouth daily.   No facility-administered encounter medications on file as of 10/18/2018.     Review of Systems:  Review of Systems  Constitutional: Negative for chills, fever and malaise/fatigue.  HENT: Negative for ear  pain.   Eyes: Negative for blurred vision.  Respiratory: Positive for shortness of breath. Negative for cough and wheezing.   Cardiovascular: Negative for chest pain, palpitations, orthopnea, claudication, leg swelling and PND.  Gastrointestinal: Negative for abdominal pain, blood in stool, constipation, diarrhea and melena.  Genitourinary: Negative for dysuria.       Incontinence  Musculoskeletal: Negative for back pain, falls and joint pain.  Skin: Negative for itching and rash.  Neurological: Negative for dizziness, loss of consciousness and headaches.  Endo/Heme/Allergies: Bruises/bleeds easily.  Psychiatric/Behavioral: Positive for depression and memory  loss. Negative for suicidal ideas. The patient is not nervous/anxious and does not have insomnia.     Health Maintenance  Topic Date Due  . INFLUENZA VACCINE  12/23/2018  . TETANUS/TDAP  01/17/2023  . DEXA SCAN  Completed  . PNA vac Low Risk Adult  Completed    Physical Exam: Vitals:   10/18/18 1328  BP: (!) 160/80  Pulse: 63  Temp: 98.4 F (36.9 C)  TempSrc: Oral  SpO2: 97%  Weight: 233 lb (105.7 kg)  Height: 5\' 6"  (1.676 m)   Body mass index is 37.61 kg/m. Physical Exam Nursing note reviewed: matrix vitals reviewed.  Constitutional:      General: She is not in acute distress.    Appearance: Normal appearance. She is obese. She is not ill-appearing, toxic-appearing or diaphoretic.  HENT:     Head: Normocephalic and atraumatic.     Right Ear: External ear normal.     Left Ear: External ear normal.  Neck:     Musculoskeletal: Neck supple.  Cardiovascular:     Rate and Rhythm: Normal rate and regular rhythm.     Heart sounds: Murmur present.  Pulmonary:     Effort: Pulmonary effort is normal.     Breath sounds: Normal breath sounds. No rales.  Abdominal:     General: Bowel sounds are normal. There is no distension.     Palpations: Abdomen is soft. There is no mass.     Tenderness: There is no abdominal  tenderness. There is no guarding or rebound.  Musculoskeletal:     Right lower leg: Edema present.     Left lower leg: Edema present.     Comments: Edema controlled with compression hose; walks with rollator walker; was dyspneic upon [place] here [place]  Skin:    General: Skin is warm and dry.     Capillary Refill: Capillary refill takes less than 2 seconds.  Neurological:     General: No focal deficit present.     Mental Status: She is alert.     Comments: Oriented to person and place not time; some stories are from the past that she describes as more current (talks about not going to church or her women's groups which she's not been doing since living here)  Psychiatric:     Comments: Mood similar to other visits; very talkative; has very negative perspective about living here and having a separate space from her husband who had increased needs beyond hers initially; lacks insight to a large extent into her memory loss and needs for assistance--continues to talk about how she's not permitted to drive, for instance     Labs reviewed: Basic Metabolic Panel: Recent Labs    12/09/17 01/25/18 10/17/18 0700  NA  --  139 143  K  --  4.4 4.5  BUN  --  12 14  CREATININE  --  0.6 0.8  TSH 3.44  --   --    Liver Function Tests: Recent Labs    10/17/18 0700  AST 20  ALT 11  ALKPHOS 126*   No results for input(s): LIPASE, AMYLASE in the last 8760 hours. No results for input(s): AMMONIA in the last 8760 hours. CBC: Recent Labs    01/25/18 10/17/18 0700  WBC 7.4 8.1  NEUTROABS 4  --   HGB 12.0 13.0  HCT 36 39  PLT 219 272   Lipid Panel: Recent Labs    10/17/18 0700  CHOL 228*  HDL 48  LDLCALC  140  TRIG 201*   Lab Results  Component Value Date   HGBA1C 6.1 10/17/2018    Procedures since last visit: No results found.  Assessment/Plan 1. Vascular dementia without behavioral disturbance ([place]) -progressive; she repeats herself more often and seems less insightful  about her health and need for assistance -continue aricept and secondary preventive efforts with bp control -typically I'm not aggressive about weight loss approaches in this group, but she keeps gaining and it's making her short of breath and she's getting more deconditioned by the day -recommended small portions and two walks the distance of [place] each day  -cont baby asa -also will plan to adjust bp meds (likely bp up due to increased weight)  2. Depression, major, recurrent, in partial remission ([place]) -continues on cymbalta therapy--symptoms seem similar to previous visits  3. Weight gain -as above, want to make sure this does not persist -does not seem to be fluid weight  4. Venous insufficiency -cont compression hose and elevate feet at rest -encouraged walking to help with circulation  5. Obstructive sleep apnea on CPAP -cont cpap at hs  6. Atherosclerosis of native coronary artery of native heart without angina pectoris -dyspnea worsening as she's becoming more deconditioned -has prn ntg but no chest pain  7. Hypertensive heart disease, unspecified whether heart failure present -cont prn lasix and potassium, currently on bystolic 5mg  daily, losartan 100mg  daily -will plan to increase bystolic to 10mg  daily  Labs/tests ordered:   Orders Placed This Encounter  Procedures  . CBC and differential    This external order was created through the Results Console.  . Basic metabolic panel    This external order was created through the Results Console.  . Lipid panel    This external order was created through the Results Console.  . Hepatic function panel    This external order was created through the Results Console.  . Hemoglobin A1c    This external order was created through the Results Console.    Next appt:  02/26/2019  Alexx Mcburney L. Dionne Knoop, D.O. [facility] [address], [place] [address] Cell Phone (Mon-Fri 8am-5pm):   [phone] On Call:  [phone] & follow prompts after 5pm & weekends Office Phone:  [phone] Office Fax:  [phone]

## 2018-10-22 MED ORDER — NEBIVOLOL HCL 10 MG PO TABS: 10.0000 mg | ORAL_TABLET | Freq: Every day | ORAL | 5 refills | Status: DC

## 2018-11-01 DIAGNOSIS — Z20828 Contact with and (suspected) exposure to other viral communicable diseases: Secondary | ICD-10-CM | POA: Diagnosis not present

## 2018-11-06 ENCOUNTER — Ambulatory Visit (INDEPENDENT_AMBULATORY_CARE_PROVIDER_SITE_OTHER): Payer: Medicare Other | Admitting: *Deleted

## 2018-11-06 DIAGNOSIS — I4430 Unspecified atrioventricular block: Secondary | ICD-10-CM

## 2018-11-06 LAB — CUP PACEART REMOTE DEVICE CHECK
Battery Remaining Longevity: 120 mo
Battery Remaining Percentage: 95.5 %
Battery Voltage: 2.98 V
Brady Statistic AP VP Percent: 66 %
Brady Statistic AP VS Percent: 1 %
Brady Statistic AS VP Percent: 33 %
Brady Statistic AS VS Percent: 1 %
Brady Statistic RA Percent Paced: 65 %
Brady Statistic RV Percent Paced: 99 %
Date Time Interrogation Session: 20200615060016
Implantable Lead Implant Date: 20151005
Implantable Lead Implant Date: 20151005
Implantable Lead Location: 753859
Implantable Lead Location: 753860
Implantable Pulse Generator Implant Date: 20151005
Lead Channel Impedance Value: 450 Ohm
Lead Channel Impedance Value: 600 Ohm
Lead Channel Pacing Threshold Amplitude: 0.75 V
Lead Channel Pacing Threshold Amplitude: 1 V
Lead Channel Pacing Threshold Pulse Width: 0.5 ms
Lead Channel Pacing Threshold Pulse Width: 0.5 ms
Lead Channel Sensing Intrinsic Amplitude: 12 mV
Lead Channel Sensing Intrinsic Amplitude: 5 mV
Lead Channel Setting Pacing Amplitude: 1 V
Lead Channel Setting Pacing Amplitude: 2 V
Lead Channel Setting Pacing Pulse Width: 0.5 ms
Lead Channel Setting Sensing Sensitivity: 4 mV
Pulse Gen Model: 2240
Pulse Gen Serial Number: 7666288

## 2018-11-13 ENCOUNTER — Encounter: Payer: Self-pay | Admitting: Cardiology

## 2018-11-13 NOTE — Progress Notes (Signed)
Remote pacemaker transmission.   

## 2018-12-21 ENCOUNTER — Encounter: Payer: Self-pay | Admitting: Adult Health

## 2018-12-21 ENCOUNTER — Non-Acute Institutional Stay: Payer: Medicare Other | Admitting: Adult Health

## 2018-12-21 ENCOUNTER — Other Ambulatory Visit: Payer: Self-pay

## 2018-12-21 DIAGNOSIS — Z0000 Encounter for general adult medical examination without abnormal findings: Secondary | ICD-10-CM | POA: Diagnosis not present

## 2018-12-21 NOTE — Patient Instructions (Signed)
[name] , Thank you for taking time to come for your Medicare Wellness Visit. I appreciate your ongoing commitment to your health goals. Please review the following plan we discussed and let me know if I can assist you in the future.   Screening recommendations/referrals: Colonoscopy aged out Mammogram aged out Bone Density needed  Recommended yearly ophthalmology/optometry visit for glaucoma screening and checkup Recommended yearly dental visit for hygiene and checkup  Vaccinations: Influenza vaccine up to date Pneumococcal vaccine up to date Tdap vaccine up to date Shingles vaccine up to date    Advanced directives: reviewed  Conditions/risks identified: fall risk  Next appointment: 1 year   Preventive Care 43 Years and Older, Female Preventive care refers to lifestyle choices and visits with your health care provider that can promote health and wellness. What does preventive care include?  A yearly physical exam. This is also called an annual well check.  Dental exams once or twice a year.  Routine eye exams. Ask your health care provider how often you should have your eyes checked.  Personal lifestyle choices, including:  Daily care of your teeth and gums.  Regular physical activity.  Eating a healthy diet.  Avoiding tobacco and drug use.  Limiting alcohol use.  Practicing safe sex.  Taking low-dose aspirin every day.  Taking vitamin and mineral supplements as recommended by your health care provider. What happens during an annual well check? The services and screenings done by your health care provider during your annual well check will depend on your age, overall health, lifestyle risk factors, and family history of disease. Counseling  Your health care provider may ask you questions about your:  Alcohol use.  Tobacco use.  Drug use.  Emotional well-being.  Home and relationship well-being.  Sexual activity.  Eating habits.  History of  falls.  Memory and ability to understand (cognition).  Work and work Statistician.  Reproductive health. Screening  You may have the following tests or measurements:  Height, weight, and BMI.  Blood pressure.  Lipid and cholesterol levels. These may be checked every 5 years, or more frequently if you are over 67 years old.  Skin check.  Lung cancer screening. You may have this screening every year starting at age 74 if you have a 30-pack-year history of smoking and currently smoke or have quit within the past 15 years.  Fecal occult blood test (FOBT) of the stool. You may have this test every year starting at age 20.  Flexible sigmoidoscopy or colonoscopy. You may have a sigmoidoscopy every 5 years or a colonoscopy every 10 years starting at age 49.  Hepatitis C blood test.  Hepatitis B blood test.  Sexually transmitted disease (STD) testing.  Diabetes screening. This is done by checking your blood sugar (glucose) after you have not eaten for a while (fasting). You may have this done every 1-3 years.  Bone density scan. This is done to screen for osteoporosis. You may have this done starting at age 83.  Mammogram. This may be done every 1-2 years. Talk to your health care provider about how often you should have regular mammograms. Talk with your health care provider about your test results, treatment options, and if necessary, the need for more tests. Vaccines  Your health care provider may recommend certain vaccines, such as:  Influenza vaccine. This is recommended every year.  Tetanus, diphtheria, and acellular pertussis (Tdap, Td) vaccine. You may need a Td booster every 10 years.  Zoster vaccine. You  may need this after age 12.  Pneumococcal 13-valent conjugate (PCV13) vaccine. One dose is recommended after age 51.  Pneumococcal polysaccharide (PPSV23) vaccine. One dose is recommended after age 39. Talk to your health care provider about which screenings and  vaccines you need and how often you need them. This information is not intended to replace advice given to you by your health care provider. Make sure you discuss any questions you have with your health care provider. Document Released: 06/06/2015 Document Revised: 01/28/2016 Document Reviewed: 03/11/2015 Elsevier Interactive Patient Education  2017 [place] Prevention in the Home Falls can cause injuries. They can happen to people of all ages. There are many things you can do to make your home safe and to help prevent falls. What can I do on the outside of my home?  Regularly fix the edges of walkways and driveways and fix any cracks.  Remove anything that might make you trip as you walk through a door, such as a raised step or threshold.  Trim any bushes or trees on the path to your home.  Use bright outdoor lighting.  Clear any walking paths of anything that might make someone trip, such as rocks or tools.  Regularly check to see if handrails are loose or broken. Make sure that both sides of any steps have handrails.  Any raised decks and porches should have guardrails on the edges.  Have any leaves, snow, or ice cleared regularly.  Use sand or salt on walking paths during winter.  Clean up any spills in your garage right away. This includes oil or grease spills. What can I do in the bathroom?  Use night lights.  Install grab bars by the toilet and in the tub and shower. Do not use towel bars as grab bars.  Use non-skid mats or decals in the tub or shower.  If you need to sit down in the shower, use a plastic, non-slip stool.  Keep the floor dry. Clean up any water that spills on the floor as soon as it happens.  Remove soap buildup in the tub or shower regularly.  Attach bath mats securely with double-sided non-slip rug tape.  Do not have throw rugs and other things on the floor that can make you trip. What can I do in the bedroom?  Use night lights.   Make sure that you have a light by your bed that is easy to reach.  Do not use any sheets or blankets that are too big for your bed. They should not hang down onto the floor.  Have a firm chair that has side arms. You can use this for support while you get dressed.  Do not have throw rugs and other things on the floor that can make you trip. What can I do in the kitchen?  Clean up any spills right away.  Avoid walking on wet floors.  Keep items that you use a lot in easy-to-reach places.  If you need to reach something above you, use a strong step stool that has a grab bar.  Keep electrical cords out of the way.  Do not use floor polish or wax that makes floors slippery. If you must use wax, use non-skid floor wax.  Do not have throw rugs and other things on the floor that can make you trip. What can I do with my stairs?  Do not leave any items on the stairs.  Make sure that there are handrails on both  sides of the stairs and use them. Fix handrails that are broken or loose. Make sure that handrails are as long as the stairways.  Check any carpeting to make sure that it is firmly attached to the stairs. Fix any carpet that is loose or worn.  Avoid having throw rugs at the top or bottom of the stairs. If you do have throw rugs, attach them to the floor with carpet tape.  Make sure that you have a light switch at the top of the stairs and the bottom of the stairs. If you do not have them, ask someone to add them for you. What else can I do to help prevent falls?  Wear shoes that:  Do not have high heels.  Have rubber bottoms.  Are comfortable and fit you well.  Are closed at the toe. Do not wear sandals.  If you use a stepladder:  Make sure that it is fully opened. Do not climb a closed stepladder.  Make sure that both sides of the stepladder are locked into place.  Ask someone to hold it for you, if possible.  Clearly mark and make sure that you can see:  Any  grab bars or handrails.  First and last steps.  Where the edge of each step is.  Use tools that help you move around (mobility aids) if they are needed. These include:  Canes.  Walkers.  Scooters.  Crutches.  Turn on the lights when you go into a dark area. Replace any light bulbs as soon as they burn out.  Set up your furniture so you have a clear path. Avoid moving your furniture around.  If any of your floors are uneven, fix them.  If there are any pets around you, be aware of where they are.  Review your medicines with your doctor. Some medicines can make you feel dizzy. This can increase your chance of falling. Ask your doctor what other things that you can do to help prevent falls. This information is not intended to replace advice given to you by your health care provider. Make sure you discuss any questions you have with your health care provider. Document Released: 03/06/2009 Document Revised: 10/16/2015 Document Reviewed: 06/14/2014 Elsevier Interactive Patient Education  2017 [employer].

## 2018-12-21 NOTE — Progress Notes (Signed)
Subjective:   KAITLIN ALCINDOR is a 85 y.o. female who presents for Medicare Annual (Subsequent) preventive examination.  Review of Systems:   Cardiac Risk Factors include: advanced age (>70men, >57 women);family history of premature cardiovascular disease;sedentary lifestyle;obesity (BMI >30kg/m2)     Objective:     Vitals: Wt 234 lb (106.1 kg)   BMI 37.77 kg/m   Body mass index is 37.77 kg/m.  Advanced Directives 12/21/2018 02/08/2018 11/09/2017 10/11/2017 10/04/2017 09/28/2017 08/02/2017  Does Patient Have a Medical Advance Directive? Yes Yes Yes Yes Yes Yes Yes  Type of Paramedic of [place];Living will;Out of facility DNR (pink MOST or yellow form) [place];Out of facility DNR (pink MOST or yellow form) [place];Out of facility DNR (pink MOST or yellow form) [place];Out of facility DNR (pink MOST or yellow form) [place];Out of facility DNR (pink MOST or yellow form) [place];Out of facility DNR (pink MOST or yellow form) Healthcare Power of Attorney  Does patient want to make changes to medical advance directive? No - Patient declined No - Patient declined No - Patient declined No - Patient declined No - Patient declined No - Patient declined No - Patient declined  Copy of [place] in Chart? Yes - validated most recent copy scanned in chart (See row information) Yes Yes Yes Yes Yes Yes  Pre-existing out of facility DNR order (yellow form or pink MOST form) - Yellow form placed in chart (order not valid for inpatient use) Yellow form placed in chart (order not valid for inpatient use) Yellow form placed in chart (order not valid for inpatient use) Yellow form placed in chart (order not valid for inpatient use) Yellow form placed in chart (order not valid for inpatient use) -    Tobacco Social History   Tobacco Use  Smoking Status Never Smoker   Smokeless Tobacco Never Used     Counseling given: Not Answered   Clinical Intake:  Pre-visit preparation completed: No  Pain : No/denies pain     Nutritional Status: BMI > 30  Obese Nutritional Risks: None Diabetes: No  How often do you need to have someone help you when you read instructions, pamphlets, or other written materials from your doctor or pharmacy?: 4 - Often What is the last grade level you completed in school?: 12th        Past Medical History:  Diagnosis Date  . Breast cancer ([place])   . Colon polyps   . Coronary artery disease 06/2006   STEMI inferior with BMS to mid RCA with mid basal inferior wall HK and luminal irrgularities elsewhere  . Fracture of femoral neck, right ([place]) 06/13/2011  . Fracture of humeral head, right, closed 06/13/2011  . GERD (gastroesophageal reflux disease)   . Hemorrhoids   . Hyperlipemia   . Hypertension   . LBBB (left bundle branch block)   . Macular degeneration   . Myocardial infarction ([place])   . Obstructive sleep apnea on CPAP   . Osteopenia   . Osteopenia   . Pacemaker 02/25/14  . Rhinitis   . Rotator cuff syndrome   . SAH (subarachnoid hemorrhage) ([place]) 02/22/14  . 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 Ulnar neuropathy   . Umbilical hernia   . Unstable gait    Past Surgical History:  Procedure Laterality Date  . BREAST LUMPECTOMY    . CATARACT EXTRACTION, BILATERAL     Per  [facility]  . CORONARY ANGIOPLASTY WITH STENT PLACEMENT  06/2006   Mid RCA BMS  . HIP ARTHROPLASTY  06/06/2011   Procedure: ARTHROPLASTY BIPOLAR HIP;  Surgeon: Gearlean Alf;  Location: [facility];  Service: Orthopedics;  Laterality: Right;  . PERMANENT PACEMAKER INSERTION N/A 02/25/2014   Procedure: PERMANENT PACEMAKER INSERTION;  Surgeon: Evans Lance, MD;  Location: [facility] CATH LAB;  Service: Cardiovascular;  Laterality: N/A;  . SHOULDER SURGERY    . TONSILLECTOMY     Per [facility]   . WRIST SURGERY     Family History   Problem Relation Age of Onset  . Heart attack Father 10  . CAD Father   . CVA Sister    Social History   Socioeconomic History  . Marital status: Married    Spouse name: Not on file  . Number of children: Not on file  . Years of education: Not on file  . Highest education level: Not on file  Occupational History  . Not on file  Social Needs  . Financial resource strain: Not hard at all  . Food insecurity    Worry: Never true    Inability: Never true  . Transportation needs    Medical: No    Non-medical: No  Tobacco Use  . Smoking status: Never Smoker  . Smokeless tobacco: Never Used  Substance and Sexual Activity  . Alcohol use: Yes    Alcohol/week: 0.0 standard drinks    Comment: occasional wine  . Drug use: Never  . Sexual activity: Not on file  Lifestyle  . Physical activity    Days per week: 0 days    Minutes per session: 0 min  . Stress: Only a little  Relationships  . Social connections    Talks on phone: More than three times a week    Gets together: More than three times a week    Attends religious service: Never    Active member of club or organization: No    Attends meetings of clubs or organizations: Never    Relationship status: Married  Other Topics Concern  . Not on file  Social History Narrative  . Not on file    Outpatient Encounter Medications as of 12/21/2018  Medication Sig  . aspirin EC 81 MG tablet Take 81 mg by mouth daily.  . Cholecalciferol (VITAMIN D3) 2000 UNITS TABS Take 3,000 Units by mouth daily.   . Coenzyme Q10 (COQ10) 200 MG CAPS Take 1 tablet by mouth daily.  Marland Kitchen donepezil (ARICEPT) 5 MG tablet Take 5 mg by mouth at bedtime.  . DULoxetine (CYMBALTA) 60 MG capsule Take 60 mg by mouth daily.  . furosemide (LASIX) 40 MG tablet Take 1 tablet (40 mg total) by mouth daily as needed (swelling or weight gain).  Marland Kitchen levothyroxine (SYNTHROID, LEVOTHROID) 25 MCG tablet Take 25 mcg by mouth daily before breakfast.   . losartan (COZAAR) 100  MG tablet Take 100 mg by mouth daily.  . Multiple Vitamins-Minerals (PRESERVISION/LUTEIN) CAPS Take 1 capsule by mouth 2 (two) times daily.  . nebivolol (BYSTOLIC) 10 MG tablet Take 1 tablet (10 mg total) by mouth daily.  . nitroGLYCERIN (NITROSTAT) 0.4 MG SL tablet Place 1 tablet (0.4 mg total) under the tongue every 5 (five) minutes as needed for chest pain.  . potassium chloride SA (K-DUR,KLOR-CON) 20 MEQ tablet Take 20 mEq by mouth daily.   . vitamin B-12 (CYANOCOBALAMIN) 1000 MCG tablet Take 1,000 mcg by mouth daily.  No facility-administered encounter medications on file as of 12/21/2018.     Activities of Daily Living In your present state of health, do you have any difficulty performing the following activities: 12/21/2018  Hearing? Y  Vision? Y  Difficulty concentrating or making decisions? Y  Walking or climbing stairs? Y  Dressing or bathing? Y  Doing errands, shopping? Y  Preparing Food and eating ? Y  Using the Toilet? N  In the past six months, have you accidently leaked urine? Y  Do you have problems with loss of bowel control? N  Managing your Medications? Y  Managing your Finances? Y  Housekeeping or managing your Housekeeping? Y  Some recent data might be hidden    Patient Care Team: Gayland Curry, DO as PCP - General (Geriatric Medicine) Sueanne Margarita, MD as PCP - Sleep Medicine (Sleep Medicine) Justice Britain, MD as Consulting Physician (Orthopedic Surgery) Gaynelle Arabian, MD as Consulting Physician (Orthopedic Surgery) Garvin Fila, MD as Consulting Physician (Neurology) Evans Lance, MD as Consulting Physician (Cardiology) Jettie Booze, MD as Consulting Physician (Interventional Cardiology) Shon Hough, MD as Consulting Physician (Ophthalmology)    Assessment:   This is a routine wellness examination for Markan.  Exercise Activities and Dietary recommendations Current Exercise Habits: The patient does not participate in regular  exercise at present, Exercise limited by: orthopedic condition(s)  Goals    . Exercise 3x per week (30 min per time)       Fall Risk Fall Risk  12/21/2018 10/18/2018 06/14/2018 02/08/2018 02/02/2018  Falls in the past year? 0 0 0 No No  Number falls in past yr: 0 0 0 - -  Injury with Fall? 0 0 0 - -  Risk Factor Category  - - - - -  Risk for fall due to : Impaired mobility - - - -  Follow up Falls evaluation completed;Falls prevention discussed - - - -   Is the patient's home free of loose throw rugs in walkways, pet beds, electrical cords, etc?   yes      Grab bars in the bathroom? yes      Handrails on the stairs?   yes      Adequate lighting?   yes  Timed Get Up and Go performed: not performed  Depression Screen PHQ 2/9 Scores 12/21/2018 10/18/2018 06/14/2018 02/08/2018  PHQ - 2 Score 1 0 0 0     Cognitive Function MMSE - Mini Mental State Exam 12/21/2018 07/30/2017  Orientation to time 4 5  Orientation to Place 5 5  Registration 3 3  Attention/ Calculation 5 5  Recall 2 3  Language- name 2 objects 2 2  Language- repeat 1 1  Language- follow 3 step command 3 3  Language- read & follow direction 1 1  Write a sentence 1 1  Copy design 1 1  Total score 28 30        Immunization History  Administered Date(s) Administered  . Influenza, High Dose Seasonal PF 02/18/2017  . Influenza,inj,Quad PF,6+ Mos 03/24/2018  . Influenza-Unspecified 03/04/2016  . Pneumococcal Conjugate-13 05/08/2014  . Pneumococcal Polysaccharide-23 10/08/2017  . Td 07/09/2002, 01/16/2013  . Zoster 06/30/2010  . Zoster Recombinat (Shingrix) 11/18/2017, 01/17/2018    Qualifies for Shingles Vaccine?up to date  Screening Tests Health Maintenance  Topic Date Due  . INFLUENZA VACCINE  12/23/2018  . TETANUS/TDAP  01/17/2023  . DEXA SCAN  Completed  . PNA vac Low Risk Adult  Completed  Cancer Screenings: Lung: Low Dose CT Chest recommended if Age 37-80 years, 30 pack-year currently smoking OR have  quit w/in 15years. Patient does not qualify. Breast:  Up to date on Mammogram? No   Up to date of Bone Density/Dexa? No Colorectal: aged out  Additional Screenings: not indicated: Hepatitis C Screening:      Plan:     I have personally reviewed and noted the following in the patient's chart:   . Medical and social history . Use of alcohol, tobacco or illicit drugs  . Current medications and supplements . Functional ability and status . Nutritional status . Physical activity . Advanced directives . List of other physicians . Hospitalizations, surgeries, and ER visits in previous 12 months . Vitals . Screenings to include cognitive, depression, and falls . Referrals and appointments  In addition, I have reviewed and discussed with patient certain preventive protocols, quality metrics, and best practice recommendations. A written personalized care plan for preventive services as well as general preventive health recommendations were provided to patient.     Royal Hawthorn, NP  12/21/2018

## 2019-02-05 ENCOUNTER — Ambulatory Visit (INDEPENDENT_AMBULATORY_CARE_PROVIDER_SITE_OTHER): Payer: Medicare Other | Admitting: *Deleted

## 2019-02-05 DIAGNOSIS — I459 Conduction disorder, unspecified: Secondary | ICD-10-CM

## 2019-02-05 DIAGNOSIS — R55 Syncope and collapse: Secondary | ICD-10-CM

## 2019-02-05 LAB — CUP PACEART REMOTE DEVICE CHECK
Battery Remaining Longevity: 122 mo
Battery Remaining Percentage: 95.5 %
Battery Voltage: 2.98 V
Brady Statistic AP VP Percent: 72 %
Brady Statistic AP VS Percent: 1 %
Brady Statistic AS VP Percent: 28 %
Brady Statistic AS VS Percent: 1 %
Brady Statistic RA Percent Paced: 71 %
Brady Statistic RV Percent Paced: 99 %
Date Time Interrogation Session: 20200914060014
Implantable Lead Implant Date: 20151005
Implantable Lead Implant Date: 20151005
Implantable Lead Location: 753859
Implantable Lead Location: 753860
Implantable Pulse Generator Implant Date: 20151005
Lead Channel Impedance Value: 480 Ohm
Lead Channel Impedance Value: 610 Ohm
Lead Channel Pacing Threshold Amplitude: 0.75 V
Lead Channel Pacing Threshold Amplitude: 1.125 V
Lead Channel Pacing Threshold Pulse Width: 0.5 ms
Lead Channel Pacing Threshold Pulse Width: 0.5 ms
Lead Channel Sensing Intrinsic Amplitude: 1.6 mV
Lead Channel Sensing Intrinsic Amplitude: 12 mV
Lead Channel Setting Pacing Amplitude: 1 V
Lead Channel Setting Pacing Amplitude: 2.125
Lead Channel Setting Pacing Pulse Width: 0.5 ms
Lead Channel Setting Sensing Sensitivity: 4 mV
Pulse Gen Model: 2240
Pulse Gen Serial Number: 7666288

## 2019-02-16 NOTE — Progress Notes (Signed)
Remote pacemaker transmission.   

## 2019-02-21 DIAGNOSIS — Z9189 Other specified personal risk factors, not elsewhere classified: Secondary | ICD-10-CM | POA: Diagnosis not present

## 2019-02-22 LAB — NOVEL CORONAVIRUS, NAA: SARS-CoV-2, NAA: NOT DETECTED

## 2019-02-26 ENCOUNTER — Encounter: Payer: Self-pay | Admitting: Internal Medicine

## 2019-02-28 ENCOUNTER — Non-Acute Institutional Stay: Payer: Medicare Other | Admitting: Internal Medicine

## 2019-02-28 ENCOUNTER — Other Ambulatory Visit: Payer: Self-pay

## 2019-02-28 VITALS — BP 128/72 | HR 76 | Temp 97.0°F | Resp 18 | Ht 66.0 in | Wt 234.0 lb

## 2019-02-28 DIAGNOSIS — F3341 Major depressive disorder, recurrent, in partial remission: Secondary | ICD-10-CM

## 2019-02-28 DIAGNOSIS — F0150 Vascular dementia without behavioral disturbance: Secondary | ICD-10-CM

## 2019-02-28 DIAGNOSIS — R0600 Dyspnea, unspecified: Secondary | ICD-10-CM | POA: Diagnosis not present

## 2019-02-28 DIAGNOSIS — M1712 Unilateral primary osteoarthritis, left knee: Secondary | ICD-10-CM | POA: Diagnosis not present

## 2019-02-28 DIAGNOSIS — I2510 Atherosclerotic heart disease of native coronary artery without angina pectoris: Secondary | ICD-10-CM

## 2019-02-28 DIAGNOSIS — R3981 Functional urinary incontinence: Secondary | ICD-10-CM

## 2019-02-28 DIAGNOSIS — R0609 Other forms of dyspnea: Secondary | ICD-10-CM

## 2019-02-28 DIAGNOSIS — Z6837 Body mass index (BMI) 37.0-37.9, adult: Secondary | ICD-10-CM | POA: Diagnosis not present

## 2019-02-28 NOTE — Progress Notes (Signed)
Location:  Occupational psychologist of Service:  Clinic (12)  Provider: Neeka Urista L. Mariea Clonts, D.O., C.M.D.  Code Status: DNR Goals of Care:  Advanced Directives 02/28/2019  Does Patient Have a Medical Advance Directive? Yes  Type of Advance Directive Out of facility DNR (pink MOST or yellow form)  Does patient want to make changes to medical advance directive? No - Patient declined  Copy of [place] in Chart? -  Pre-existing out of facility DNR order (yellow form or pink MOST form) Yellow form placed in chart (order not valid for inpatient use)     Chief Complaint  Patient presents with  . Medical Management of Chronic Issues    4 mo f/u    HPI: Patient is a 85 y.o. female seen today for medical management of chronic diseases.    She is doing well except her usual complaints about not living in the same apt as her husband and social isolation from covid.  Says she was isolated even before covid since moving to [facility] (neither she nor her husband can drive and they have not been able, cognitively, to arrange to get out without family or staff assistance historically either).    [name]'s biggest complaint today was her left knee pain.  Staff also report it's really bad at times.  It's definitely affecting her ambulation.  She looked thru her roladex for her orthopedist's name to no avail.  I spoke with her nurse who noted that she has already reached out to their daughter to see if she could find the information about who she last saw--pt reports she had a cartilage type injection not a steroid injection about 3 years ago.  I would have done a steroid injection, but do not have access to cartilage medication options here.    Since Covid-19 and her knee, [name]'s walking has decreased.  Weight is up 14 lbs since Feb (pre-covid) to 234 lbs.  Appears this has been stable now since July.  She has a healthy appetite.  She does admit to chronic dyspnea on  exertion, but does not physically show signs of volume overload at present.  She is on appropriate CHF meds with bystolic, lasix and cozaar.    Her short-term memory remains poor.  She is on low dose aricept.  It appears she has adjusted to AL and has a good routine now based on the appearance of her apt being much improved from when she first came.  She still struggles with urinary incontinence.  She's had no recent UTIs.  She spent much of the visit reminiscing about she and Jimmy's past.  She showed me photos of the family.  She continues to want them to move somewhere else together.    She reports she has been to counseling as I've suggested to her.  This could not have been recent with covid.  She is taking and tolerating cymbalta well.    Past Medical History:  Diagnosis Date  . Breast cancer ([place])   . Colon polyps   . Coronary artery disease 06/2006   STEMI inferior with BMS to mid RCA with mid basal inferior wall HK and luminal irrgularities elsewhere  . Fracture of femoral neck, right ([place]) 06/13/2011  . Fracture of humeral head, right, closed 06/13/2011  . GERD (gastroesophageal reflux disease)   . Hemorrhoids   . Hyperlipemia   . Hypertension   . LBBB (left bundle branch block)   . Macular  degeneration   . Myocardial infarction ([place])   . Obstructive sleep apnea on CPAP   . Osteopenia   . Osteopenia   . Pacemaker 02/25/14  . Rhinitis   . Rotator cuff syndrome   . SAH (subarachnoid hemorrhage) ([place]) 02/22/14  . 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 Ulnar neuropathy   . Umbilical hernia   . Unstable gait     Past Surgical History:  Procedure Laterality Date  . BREAST LUMPECTOMY    . CATARACT EXTRACTION, BILATERAL     Per [facility]  . CORONARY ANGIOPLASTY WITH STENT PLACEMENT  06/2006   Mid RCA BMS  . HIP ARTHROPLASTY  06/06/2011   Procedure: ARTHROPLASTY BIPOLAR HIP;  Surgeon: Gearlean Alf;  Location: [facility];  Service: Orthopedics;  Laterality: Right;  .  PERMANENT PACEMAKER INSERTION N/A 02/25/2014   Procedure: PERMANENT PACEMAKER INSERTION;  Surgeon: Evans Lance, MD;  Location: [facility] CATH LAB;  Service: Cardiovascular;  Laterality: N/A;  . SHOULDER SURGERY    . TONSILLECTOMY     Per [facility]   . WRIST SURGERY      Allergies  Allergen Reactions  . Chlorpheniramine Anaphylaxis  . Codeine Anaphylaxis  . Nystatin Rash    redness  . Amlodipine     Per [facility]     Outpatient Encounter Medications as of 02/28/2019  Medication Sig  . aspirin EC 81 MG tablet Take 81 mg by mouth daily.  . Cholecalciferol (VITAMIN D3) 2000 UNITS TABS Take 3,000 Units by mouth daily.   . Coenzyme Q10 (COQ10) 200 MG CAPS Take 1 tablet by mouth daily.  Marland Kitchen DEXTRAN 70-HYPROMELLOSE OP Place 1 drop into both eyes 2 (two) times daily as needed. For dry eyes  . donepezil (ARICEPT) 5 MG tablet Take 5 mg by mouth at bedtime.  . DULoxetine (CYMBALTA) 60 MG capsule Take 60 mg by mouth daily.  . furosemide (LASIX) 40 MG tablet Take 1 tablet (40 mg total) by mouth daily as needed (swelling or weight gain).  . furosemide (LASIX) 40 MG tablet Take 40 mg by mouth as needed for edema.  Marland Kitchen levothyroxine (SYNTHROID, LEVOTHROID) 25 MCG tablet Take 25 mcg by mouth daily before breakfast.   . losartan (COZAAR) 100 MG tablet Take 100 mg by mouth daily.  . Multiple Vitamins-Minerals (PRESERVISION/LUTEIN) CAPS Take 1 capsule by mouth 2 (two) times daily.  . nebivolol (BYSTOLIC) 10 MG tablet Take 1 tablet (10 mg total) by mouth daily.  . nitroGLYCERIN (NITROSTAT) 0.4 MG SL tablet Place 1 tablet (0.4 mg total) under the tongue every 5 (five) minutes as needed for chest pain.  . potassium chloride SA (K-DUR,KLOR-CON) 20 MEQ tablet Take 20 mEq by mouth daily.   . vitamin B-12 (CYANOCOBALAMIN) 1000 MCG tablet Take 1,000 mcg by mouth daily.   No facility-administered encounter medications on file as of 02/28/2019.     Review of Systems:  Review of Systems   Constitutional: Negative for chills, fever, malaise/fatigue and weight loss.  HENT: Positive for hearing loss.   Eyes: Negative for blurred vision.       Uses reading glasses  Respiratory: Negative for cough and shortness of breath.        Is dyspneic on exertion  Cardiovascular: Positive for leg swelling. Negative for chest pain and palpitations.       No increase in edema, nonpitting  Gastrointestinal: Positive for constipation. Negative for abdominal pain.       A single prune works well  Genitourinary: Negative for dysuria.       Urinary incontinence  Musculoskeletal: Positive for joint pain. Negative for falls.       Left knee; struggles to get up out of chair independently also  Skin: Negative for itching and rash.  Neurological: Positive for weakness. Negative for dizziness and loss of consciousness.       Deconditioning  Endo/Heme/Allergies: Bruises/bleeds easily.  Psychiatric/Behavioral: Positive for depression and memory loss. The patient is not nervous/anxious and does not have insomnia.     Health Maintenance  Topic Date Due  . INFLUENZA VACCINE  12/23/2018  . TETANUS/TDAP  01/17/2023  . DEXA SCAN  Completed  . PNA vac Low Risk Adult  Completed    Physical Exam: Vitals:   02/28/19 1240  BP: 128/72  Pulse: 76  Resp: 18  Temp: (!) 97 F (36.1 C)  Weight: 234 lb (106.1 kg)  Height: '5\' 6"'$  (1.676 m)   Body mass index is 37.77 kg/m. Physical Exam Vitals signs and nursing note reviewed.  Constitutional:      General: She is not in acute distress.    Appearance: She is obese. She is not ill-appearing or toxic-appearing.  HENT:     Head: Normocephalic and atraumatic.  Eyes:     Extraocular Movements: Extraocular movements intact.     Pupils: Pupils are equal, round, and reactive to light.  Cardiovascular:     Rate and Rhythm: Normal rate and regular rhythm.     Pulses: Normal pulses.     Heart sounds: Normal heart sounds. No murmur.  Pulmonary:      Effort: Pulmonary effort is normal.     Breath sounds: Normal breath sounds. No wheezing, rhonchi or rales.  Abdominal:     General: Bowel sounds are normal.     Palpations: Abdomen is soft.     Tenderness: There is no abdominal tenderness.  Musculoskeletal: Normal range of motion.        General: Tenderness present.     Comments: Tenderness over entire left knee, mild effusion, hurts to bear weight; very slow get up and go--takes multiple attempts even with lift chair  Skin:    General: Skin is warm and dry.  Neurological:     General: No focal deficit present.     Mental Status: She is alert and oriented to person, place, and time.     Comments: But repeats herself some over visit; seems less so today that some prior times  Psychiatric:        Mood and Affect: Mood normal.     Comments: Was very pleasant and sociable, seems actually more content than last few visits     Labs reviewed: Basic Metabolic Panel: Recent Labs    10/17/18 0700  NA 143  K 4.5  BUN 14  CREATININE 0.8   Liver Function Tests: Recent Labs    10/17/18 0700  AST 20  ALT 11  ALKPHOS 126*   No results for input(s): LIPASE, AMYLASE in the last 8760 hours. No results for input(s): AMMONIA in the last 8760 hours. CBC: Recent Labs    10/17/18 0700  WBC 8.1  HGB 13.0  HCT 39  PLT 272   Lipid Panel: Recent Labs    10/17/18 0700  CHOL 228*  HDL 48  LDLCALC 140  TRIG 201*   Lab Results  Component Value Date   HGBA1C 6.1 10/17/2018    Procedures since last visit: No results found.  Assessment/Plan 1. Localized osteoarthritis  of left knee -appears she's seen Dr. Wynelle Link at [facility] in the past so would recommend she f//u with him for a possible injection -would also suggest that after that she do some PT again due to her deconditioning and struggles getting up out of chair, dyspnea on exertion  2. Vascular dementia without behavioral disturbance (HCC) -today seemed to be a better  day, some short-term memory loss noted and insight and judgment about future plans remain poor -cont low dose aricept and AL level of care with additional assistance with ADLs for personal hygiene which has been quite helpful  3. Functional urinary incontinence -remains, cont regular bathing and changing of adult undergarments, better lately since adjusting to AL routine and help  4. Depression, major, recurrent, in partial remission ([place]) -continue cymbalta therapy, does well to chat with others  5. Morbid obesity ([place]) -encourage walking in halls as able -will address knee OA to help with this -needs some weight loss even at her age due to damage to joints and effects on her ability to get around now  6. BMI 37.0-37.9, adult -as in #5, encourage exercise, healthy eating habits  -would like to get BMI down to 33 as older adults do well with BMI 23-33  7. Dyspnea on exertion -multifactorial:  Obesity, deconditioning, aging changes to heart and lungs, aortic valve disease, CAD, possibly some chronic diastolic chf  Labs/tests ordered:  Will be happy to refer to orthopedics--I do see where she had a right hip injection in 2019 by Dr. [name] it's the left knee  Next appt:  07/04/2019 med [place]. Abshir Paolini, D.O. [facility] [address], [place] [address] Cell Phone (Mon-Fri 8am-5pm):  [phone] On Call:  [phone] & follow prompts after 5pm & weekends Office Phone:  [phone] Office Fax:  [phone]

## 2019-03-01 DIAGNOSIS — D649 Anemia, unspecified: Secondary | ICD-10-CM | POA: Diagnosis not present

## 2019-03-01 DIAGNOSIS — E119 Type 2 diabetes mellitus without complications: Secondary | ICD-10-CM | POA: Diagnosis not present

## 2019-03-01 DIAGNOSIS — I10 Essential (primary) hypertension: Secondary | ICD-10-CM | POA: Diagnosis not present

## 2019-03-01 LAB — HEMOGLOBIN A1C: Hemoglobin A1C: 6.1

## 2019-03-01 LAB — BASIC METABOLIC PANEL
BUN: 16 (ref 4–21)
Creatinine: 0.8 (ref 0.5–1.1)
Glucose: 110
Potassium: 4.2 (ref 3.4–5.3)
Sodium: 140 (ref 137–147)

## 2019-03-02 ENCOUNTER — Encounter: Payer: Self-pay | Admitting: Internal Medicine

## 2019-03-02 LAB — NOVEL CORONAVIRUS, NAA: SARS-CoV-2, NAA: NOT DETECTED

## 2019-03-07 ENCOUNTER — Encounter: Payer: Self-pay | Admitting: *Deleted

## 2019-03-07 DIAGNOSIS — Z9189 Other specified personal risk factors, not elsewhere classified: Secondary | ICD-10-CM | POA: Diagnosis not present

## 2019-03-13 DIAGNOSIS — Z9189 Other specified personal risk factors, not elsewhere classified: Secondary | ICD-10-CM | POA: Diagnosis not present

## 2019-03-14 ENCOUNTER — Encounter: Payer: Self-pay | Admitting: Internal Medicine

## 2019-03-20 DIAGNOSIS — Z9189 Other specified personal risk factors, not elsewhere classified: Secondary | ICD-10-CM | POA: Diagnosis not present

## 2019-03-27 DIAGNOSIS — Z9189 Other specified personal risk factors, not elsewhere classified: Secondary | ICD-10-CM | POA: Diagnosis not present

## 2019-03-30 ENCOUNTER — Ambulatory Visit (INDEPENDENT_AMBULATORY_CARE_PROVIDER_SITE_OTHER): Payer: Medicare Other | Admitting: Family Medicine

## 2019-03-30 ENCOUNTER — Other Ambulatory Visit: Payer: Self-pay

## 2019-03-30 ENCOUNTER — Encounter: Payer: Self-pay | Admitting: Family Medicine

## 2019-03-30 DIAGNOSIS — M1712 Unilateral primary osteoarthritis, left knee: Secondary | ICD-10-CM

## 2019-03-30 NOTE — Progress Notes (Signed)
Office Visit Note   Patient: [name]           Date of Birth: 1928-10-04           MRN: [id number] Visit Date: 03/30/2019 Requested by: Gayland Curry, DO [address] [place],  [place] [address] PCP: Gayland Curry, DO  Subjective: Chief Complaint  Patient presents with  . Left Knee - Pain    Pain with walking. Ambulating with rollator.     HPI: She is here with left knee pain.  She has a history of osteoarthritis, treated at another clinic with injections.  She thinks it was cortisone, although her last visit with her PCP mention possible hyaluronic acid injections.  But today she states that cortisone is the only familiar name to her.  She has pain primarily with weightbearing, her knee feels unsteady when she stands because of her pain.  Pain goes away at rest.  She is not taking medication for it.  Denies any locking or catching symptoms.              ROS: No fevers or chills.  All other systems were reviewed and are negative.  Objective: Vital Signs: There were no vitals taken for this visit.  Physical Exam:  General:  Alert and oriented, in no acute distress. Pulm:  Breathing unlabored. Psy:  Normal mood, congruent affect. Skin: No rash or erythema. Left knee: 1-2+ effusion with no warmth.  1+ patellofemoral crepitus, full active extension and flexion of 120 degrees.  She is tender on the lateral joint line, pain but no palpable click with McMurray's.  Imaging: None today.  Assessment & Plan: 1.  Left knee pain with effusion, most likely due to osteoarthritis -Discussed options with her and elected to inject with cortisone today.  If this gives only short-term relief, she will call us and we will get prior approval for gel injections.     Procedures: Left knee steroid injection: After sterile prep with Betadine, injected 5 cc 1% lidocaine without epinephrine and 40 mg methylprednisolone from superolateral approach, a flash of clear yellow synovial fluid  was obtained prior to injection confirming intra-articular placement.  She felt very good during the immediate anesthetic phase.    PMFS History: Patient Active Problem List   Diagnosis Date Noted  . Functional urinary incontinence 10/21/2017  . B12 deficiency 08/11/2017  . Personal history of breast cancer 08/11/2017  . Age-related osteoporosis without current pathological fracture 08/11/2017  . Bilateral lower extremity edema 03/09/2016  . Old MI (myocardial infarction) 03/27/2015  . Venous insufficiency 06/10/2014  . Aortic insufficiency 05/28/2014  . Unstable gait   . Subarachnoid hemorrhage ([place]) 02/25/2014  . Pacemaker 02/25/2014  . Syncope and collapse 02/22/2014  . 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 Coronary atherosclerosis of native coronary artery 07/27/2013  . Obstructive sleep apnea on CPAP 02/26/2013  . Obesity (BMI 30-39.9) 02/26/2013  . Myocardial infarction ([place])   . Rhinitis   . Osteopenia   . Rotator cuff syndrome   . Hyperlipemia   . Anemia   . GERD (gastroesophageal reflux disease)   . Breast cancer ([place]) 08/05/2011  . Fracture of femoral neck, right ([place]) 06/13/2011  . Fracture of humeral head, right, closed 06/13/2011  . HTN (hypertension) 06/13/2011  . Coronary artery disease 06/24/2006   Past Medical History:  Diagnosis Date  . Breast cancer ([place])   . Colon polyps   . Coronary artery disease 06/2006   STEMI inferior with BMS  to mid RCA with mid basal inferior wall HK and luminal irrgularities elsewhere  . Fracture of femoral neck, right ([place]) 06/13/2011  . Fracture of humeral head, right, closed 06/13/2011  . GERD (gastroesophageal reflux disease)   . Hemorrhoids   . Hyperlipemia   . Hypertension   . LBBB (left bundle branch block)   . Macular degeneration   . Myocardial infarction ([place])   . Obstructive sleep apnea on CPAP   . Osteopenia   . Osteopenia   . Pacemaker 02/25/14  . Rhinitis   . Rotator cuff syndrome   . SAH (subarachnoid  hemorrhage) ([place]) 02/22/14  . 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 Ulnar neuropathy   . Umbilical hernia   . Unstable gait     Family History  Problem Relation Age of Onset  . Heart attack Father 71  . CAD Father   . CVA Sister     Past Surgical History:  Procedure Laterality Date  . BREAST LUMPECTOMY    . CATARACT EXTRACTION, BILATERAL     Per [facility]  . CORONARY ANGIOPLASTY WITH STENT PLACEMENT  06/2006   Mid RCA BMS  . HIP ARTHROPLASTY  06/06/2011   Procedure: ARTHROPLASTY BIPOLAR HIP;  Surgeon: Gearlean Alf;  Location: [facility];  Service: Orthopedics;  Laterality: Right;  . PERMANENT PACEMAKER INSERTION N/A 02/25/2014   Procedure: PERMANENT PACEMAKER INSERTION;  Surgeon: Evans Lance, MD;  Location: [facility] CATH LAB;  Service: Cardiovascular;  Laterality: N/A;  . SHOULDER SURGERY    . TONSILLECTOMY     Per [facility]   . WRIST SURGERY     Social History   Occupational History  . Not on file  Tobacco Use  . Smoking status: Never Smoker  . Smokeless tobacco: Never Used  Substance and Sexual Activity  . Alcohol use: Yes    Alcohol/week: 0.0 standard drinks    Comment: occasional wine  . Drug use: Never  . Sexual activity: Not on file

## 2019-04-04 DIAGNOSIS — Z9189 Other specified personal risk factors, not elsewhere classified: Secondary | ICD-10-CM | POA: Diagnosis not present

## 2019-04-10 DIAGNOSIS — Z9189 Other specified personal risk factors, not elsewhere classified: Secondary | ICD-10-CM | POA: Diagnosis not present

## 2019-04-18 DIAGNOSIS — Z9189 Other specified personal risk factors, not elsewhere classified: Secondary | ICD-10-CM | POA: Diagnosis not present

## 2019-04-18 DIAGNOSIS — Z20828 Contact with and (suspected) exposure to other viral communicable diseases: Secondary | ICD-10-CM | POA: Diagnosis not present

## 2019-04-26 ENCOUNTER — Ambulatory Visit: Payer: Medicare Other | Admitting: Family Medicine

## 2019-04-27 ENCOUNTER — Encounter: Payer: Self-pay | Admitting: Family Medicine

## 2019-04-27 ENCOUNTER — Other Ambulatory Visit: Payer: Self-pay

## 2019-04-27 ENCOUNTER — Ambulatory Visit (INDEPENDENT_AMBULATORY_CARE_PROVIDER_SITE_OTHER): Payer: Medicare Other | Admitting: Family Medicine

## 2019-04-27 DIAGNOSIS — M1712 Unilateral primary osteoarthritis, left knee: Secondary | ICD-10-CM

## 2019-04-27 NOTE — Progress Notes (Signed)
Office Visit Note   Patient: [name]           Date of Birth: 05-16-1929           MRN: [id number] Visit Date: 04/27/2019 Requested by: Gayland Curry, DO [address] [place],  [place] [address] PCP: Gayland Curry, DO  Subjective: Chief Complaint  Patient presents with  . Left Knee - Pain    Left knee pain. Can move the leg better after the cortisone injection last month. Still hurts to bear weight on the left leg.    HPI: She is here with persistent left knee pain.  Cortisone injection 1 month ago helped for a little while but not very long.  She still cannot bear full weight on her leg.               ROS:   All other systems were reviewed and are negative.  Objective: Vital Signs: There were no vitals taken for this visit.  Physical Exam:  General:  Alert and oriented, in no acute distress. Pulm:  Breathing unlabored. Psy:  Normal mood, congruent affect. Skin: No erythema, slight increased warmth. Left knee: 1+ effusion, 5 degree flexion contracture and flexion motion of little more than 90 degrees.  Tender on the lateral joint line.  Imaging: None today  Assessment & Plan: 1.  Persistent left knee pain probably due to DJD, cannot rule out lateral meniscus tear -1 more cortisone injection today for symptom relief.  We will request approval for gel injection. -If she still fails to improve, then possibly MRI scan.     Procedures: Left knee steroid injection: After sterile prep of Betadine, injected 5 cc 1% lidocaine without epinephrine and 40 mg methylprednisolone from superolateral approach, a flash of clear yellow synovial fluid was obtained prior to injection.    PMFS History: Patient Active Problem List   Diagnosis Date Noted  . Functional urinary incontinence 10/21/2017  . B12 deficiency 08/11/2017  . Personal history of breast cancer 08/11/2017  . Age-related osteoporosis without current pathological fracture 08/11/2017  . Bilateral lower  extremity edema 03/09/2016  . Old MI (myocardial infarction) 03/27/2015  . Venous insufficiency 06/10/2014  . Aortic insufficiency 05/28/2014  . Unstable gait   . Subarachnoid hemorrhage ([place]) 02/25/2014  . Pacemaker 02/25/2014  . Syncope and collapse 02/22/2014  . 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 Coronary atherosclerosis of native coronary artery 07/27/2013  . Obstructive sleep apnea on CPAP 02/26/2013  . Obesity (BMI 30-39.9) 02/26/2013  . Myocardial infarction ([place])   . Rhinitis   . Osteopenia   . Rotator cuff syndrome   . Hyperlipemia   . Anemia   . GERD (gastroesophageal reflux disease)   . Breast cancer ([place]) 08/05/2011  . Fracture of femoral neck, right ([place]) 06/13/2011  . Fracture of humeral head, right, closed 06/13/2011  . HTN (hypertension) 06/13/2011  . Coronary artery disease 06/24/2006   Past Medical History:  Diagnosis Date  . Breast cancer ([place])   . Colon polyps   . Coronary artery disease 06/2006   STEMI inferior with BMS to mid RCA with mid basal inferior wall HK and luminal irrgularities elsewhere  . Fracture of femoral neck, right ([place]) 06/13/2011  . Fracture of humeral head, right, closed 06/13/2011  . GERD (gastroesophageal reflux disease)   . Hemorrhoids   . Hyperlipemia   . Hypertension   . LBBB (left bundle branch block)   . Macular degeneration   . Myocardial infarction ([place])   .  Obstructive sleep apnea on CPAP   . Osteopenia   . Osteopenia   . Pacemaker 02/25/14  . Rhinitis   . Rotator cuff syndrome   . SAH (subarachnoid hemorrhage) ([place]) 02/22/14  . 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 Ulnar neuropathy   . Umbilical hernia   . Unstable gait     Family History  Problem Relation Age of Onset  . Heart attack Father 40  . CAD Father   . CVA Sister     Past Surgical History:  Procedure Laterality Date  . BREAST LUMPECTOMY    . CATARACT EXTRACTION, BILATERAL     Per [facility]  . CORONARY ANGIOPLASTY WITH STENT PLACEMENT  06/2006    Mid RCA BMS  . HIP ARTHROPLASTY  06/06/2011   Procedure: ARTHROPLASTY BIPOLAR HIP;  Surgeon: Gearlean Alf;  Location: [facility];  Service: Orthopedics;  Laterality: Right;  . PERMANENT PACEMAKER INSERTION N/A 02/25/2014   Procedure: PERMANENT PACEMAKER INSERTION;  Surgeon: Evans Lance, MD;  Location: [facility] CATH LAB;  Service: Cardiovascular;  Laterality: N/A;  . SHOULDER SURGERY    . TONSILLECTOMY     Per [facility]   . WRIST SURGERY     Social History   Occupational History  . Not on file  Tobacco Use  . Smoking status: Never Smoker  . Smokeless tobacco: Never Used  Substance and Sexual Activity  . Alcohol use: Yes    Alcohol/week: 0.0 standard drinks    Comment: occasional wine  . Drug use: Never  . Sexual activity: Not on file

## 2019-04-30 ENCOUNTER — Telehealth: Payer: Self-pay

## 2019-04-30 NOTE — Telephone Encounter (Signed)
Submitted VOB for Monovisc, left knee. 

## 2019-04-30 NOTE — Progress Notes (Signed)
Noted  

## 2019-05-03 ENCOUNTER — Telehealth: Payer: Self-pay

## 2019-05-03 NOTE — Telephone Encounter (Signed)
Talked with patient's daughter and she stated that they would like to hold off on getting gel injection, due to patient receiving cort. Injection recently.  Stated that they will call the office back when patient is ready to proceed with gel injection.

## 2019-05-04 ENCOUNTER — Telehealth: Payer: Self-pay | Admitting: Internal Medicine

## 2019-05-04 NOTE — Telephone Encounter (Signed)
New Message    [name] is calling and is asking for the pts medical records to be sent to Dr Roney Jaffe [phone] Attention to [place]  Pt is needing the records to be sent today  She has her appt on Monday Morning     Please call

## 2019-05-07 ENCOUNTER — Ambulatory Visit (INDEPENDENT_AMBULATORY_CARE_PROVIDER_SITE_OTHER): Payer: Medicare Other | Admitting: *Deleted

## 2019-05-07 DIAGNOSIS — Z950 Presence of cardiac pacemaker: Secondary | ICD-10-CM

## 2019-05-07 DIAGNOSIS — Z9189 Other specified personal risk factors, not elsewhere classified: Secondary | ICD-10-CM | POA: Diagnosis not present

## 2019-05-07 DIAGNOSIS — Z20828 Contact with and (suspected) exposure to other viral communicable diseases: Secondary | ICD-10-CM | POA: Diagnosis not present

## 2019-05-07 LAB — CUP PACEART REMOTE DEVICE CHECK
Battery Remaining Longevity: 122 mo
Battery Remaining Percentage: 95.5 %
Battery Voltage: 2.98 V
Brady Statistic AP VP Percent: 72 %
Brady Statistic AP VS Percent: 1 %
Brady Statistic AS VP Percent: 27 %
Brady Statistic AS VS Percent: 1 %
Brady Statistic RA Percent Paced: 71 %
Brady Statistic RV Percent Paced: 99 %
Date Time Interrogation Session: 20201214060016
Implantable Lead Implant Date: 20151005
Implantable Lead Implant Date: 20151005
Implantable Lead Location: 753859
Implantable Lead Location: 753860
Implantable Pulse Generator Implant Date: 20151005
Lead Channel Impedance Value: 480 Ohm
Lead Channel Impedance Value: 660 Ohm
Lead Channel Pacing Threshold Amplitude: 0.75 V
Lead Channel Pacing Threshold Amplitude: 1 V
Lead Channel Pacing Threshold Pulse Width: 0.5 ms
Lead Channel Pacing Threshold Pulse Width: 0.5 ms
Lead Channel Sensing Intrinsic Amplitude: 12 mV
Lead Channel Sensing Intrinsic Amplitude: 5 mV
Lead Channel Setting Pacing Amplitude: 1 V
Lead Channel Setting Pacing Amplitude: 2 V
Lead Channel Setting Pacing Pulse Width: 0.5 ms
Lead Channel Setting Sensing Sensitivity: 4 mV
Pulse Gen Model: 2240
Pulse Gen Serial Number: 7666288

## 2019-05-11 DIAGNOSIS — Z9189 Other specified personal risk factors, not elsewhere classified: Secondary | ICD-10-CM | POA: Diagnosis not present

## 2019-05-11 DIAGNOSIS — Z20828 Contact with and (suspected) exposure to other viral communicable diseases: Secondary | ICD-10-CM | POA: Diagnosis not present

## 2019-05-14 DIAGNOSIS — Z9189 Other specified personal risk factors, not elsewhere classified: Secondary | ICD-10-CM | POA: Diagnosis not present

## 2019-05-14 DIAGNOSIS — Z20828 Contact with and (suspected) exposure to other viral communicable diseases: Secondary | ICD-10-CM | POA: Diagnosis not present

## 2019-05-28 DIAGNOSIS — Z9189 Other specified personal risk factors, not elsewhere classified: Secondary | ICD-10-CM | POA: Diagnosis not present

## 2019-05-28 DIAGNOSIS — Z20828 Contact with and (suspected) exposure to other viral communicable diseases: Secondary | ICD-10-CM | POA: Diagnosis not present

## 2019-05-29 ENCOUNTER — Telehealth: Payer: Self-pay | Admitting: *Deleted

## 2019-05-29 NOTE — Telephone Encounter (Signed)
Appointment schedule with Leanor Kail on 01/14 @ 11:00 am

## 2019-05-29 NOTE — Telephone Encounter (Signed)
Primary Cardiologist:James Allred, MD  Chart reviewed as part of pre-operative protocol coverage. Because of [name]'s past medical history and time since last visit, he/she will require a follow-up visit in order to better assess preoperative cardiovascular risk. Although dental extractions are low risk, she has not been seen by cardiology since Feb 2020 (EP) and 2019 by primary cardiologist. They are requiring both EP and Primary cardiologist sign off.   Pre-op covering staff: - Please schedule appointment and call patient to inform them. - Please contact requesting surgeon's office via preferred method (i.e, phone, fax) to inform them of need for appointment prior to surgery.  If applicable, this message will also be routed to pharmacy pool and/or primary cardiologist for input on holding anticoagulant/antiplatelet agent as requested below so that this information is available at time of patient's appointment.   Jory Sims, NP  04/10/2019, 8:14 AM

## 2019-05-29 NOTE — Telephone Encounter (Signed)
   [facility] Pre-operative Risk Assessment    Request for surgical clearance:  1. What type of surgery is being performed? 2 UPPER TEETH TO BE EXTRACTED   2. When is this surgery scheduled? TBD   3. What type of clearance is required (medical clearance vs. Pharmacy clearance to hold med vs. Both)? MEDICAL  4. Are there any medications that need to be held prior to surgery and how long? ASA ; THEY WILL ALSO NEED DEVICE CLEARANCE. CLEARANCE FORM HAS BEEN GIVEN TO DEVICE TEAM 05/29/19  5. Practice name and name of physician performing surgery? [facility]; DR. Roney Jaffe, D.D.S   6. What is your office phone number [phone]    7.   What is your office fax number [phone]  8.   Anesthesia type (None, local, MAC, general) ? LOCAL   Julaine Hua 05/29/2019, 3:36 PM  _________________________________________________________________   (provider comments below)

## 2019-05-30 NOTE — Telephone Encounter (Signed)
   Primary Cardiologist: Dr. Lovena Le  Chart reviewed as part of pre-operative protocol coverage. I have reviewed her records a second time to evaluate her need for appointment. She may proceed with dental extractions and see Dr. Lovena Le in June of 2021. Last PPM check was done in December 2020.   Simple dental extractions are considered low risk procedures per guidelines and generally do not require any specific cardiac clearance. It is also generally accepted that for simple extractions and dental cleanings, there is no need to interrupt blood thinner therapy.   SBE prophylaxis is/is not required for the patient.  I will route this recommendation to the requesting party via Epic fax function and remove from pre-op pool.  Please call with questions.  Jory Sims, NP 05/30/2019, 9:29 AM

## 2019-05-30 NOTE — Telephone Encounter (Signed)
Clearance routed via Epic and manually fax to Dr Loyal Gambler office

## 2019-06-04 ENCOUNTER — Telehealth: Payer: Self-pay | Admitting: Cardiology

## 2019-06-04 DIAGNOSIS — G4733 Obstructive sleep apnea (adult) (pediatric): Secondary | ICD-10-CM

## 2019-06-04 NOTE — Telephone Encounter (Signed)
ResMed CPAP at 14cm H2O with heated humidity and mask of choice - needs virtual followup with me in 10 weeks after she gets her device

## 2019-06-04 NOTE — Telephone Encounter (Signed)
Patient's daughter is calling back for an update.

## 2019-06-04 NOTE — Telephone Encounter (Signed)
Gae Bon  Does patient qualify for new device yet?  Shynice Sigel

## 2019-06-04 NOTE — Telephone Encounter (Signed)
Patient's daughter states that her mom's CPAP stopped working last week and they just found out about it today. She states that the CPAP is old and the patient probably needs a new one. Advised her that I would reach out to Dr. Radford Pax and Gae Bon to see if we can order a new one.   Patient was last seen by Dr. Radford Pax 06/30/18.

## 2019-06-04 NOTE — Telephone Encounter (Signed)
Patient's daughter Elisabeth Cara calling stating the patient's CPAP stopped working last week. She says they did not know until today and the patient needs a new one as soon as possible.

## 2019-06-04 NOTE — Telephone Encounter (Signed)
Per [facility] she is eligible for a new device. Last date 2013.

## 2019-06-04 NOTE — Telephone Encounter (Signed)
Pressure of set of 11 cm.

## 2019-06-04 NOTE — Telephone Encounter (Signed)
Please find out what pressure she is on from DME

## 2019-06-05 ENCOUNTER — Non-Acute Institutional Stay: Payer: Medicare Other | Admitting: Internal Medicine

## 2019-06-05 ENCOUNTER — Encounter: Payer: Self-pay | Admitting: Internal Medicine

## 2019-06-05 DIAGNOSIS — B369 Superficial mycosis, unspecified: Secondary | ICD-10-CM

## 2019-06-05 DIAGNOSIS — L03115 Cellulitis of right lower limb: Secondary | ICD-10-CM

## 2019-06-05 DIAGNOSIS — Z23 Encounter for immunization: Secondary | ICD-10-CM | POA: Diagnosis not present

## 2019-06-05 DIAGNOSIS — L03116 Cellulitis of left lower limb: Secondary | ICD-10-CM

## 2019-06-05 DIAGNOSIS — I872 Venous insufficiency (chronic) (peripheral): Secondary | ICD-10-CM | POA: Diagnosis not present

## 2019-06-05 NOTE — Progress Notes (Signed)
Patient ID: [name], female   DOB: 1929-03-08, 86 y.o.   MRN: [id number]   Location:  [facility] Room Number: [id number] A Place of Service:  ALF [phone]) Provider:   Gayland Curry, DO  Patient Care Team: Gayland Curry, DO as PCP - General (Geriatric Medicine) Sueanne Margarita, MD as PCP - Sleep Medicine (Sleep Medicine) Justice Britain, MD as Consulting Physician (Orthopedic Surgery) Gaynelle Arabian, MD as Consulting Physician (Orthopedic Surgery) Garvin Fila, MD as Consulting Physician (Neurology) Evans Lance, MD as Consulting Physician (Cardiology) Jettie Booze, MD as Consulting Physician (Interventional Cardiology) Shon Hough, MD as Consulting Physician (Ophthalmology)  Extended Emergency Contact Information Primary Emergency Contact: Brodbeck,JED Address: [place], [place] Phone: [phone] Mobile Phone: [phone] Relation: Son Secondary Emergency Contact: [place] Mobile Phone: [phone] Relation: Daughter  Code Status:  DNR Goals of care: Advanced Directive information Advanced Directives 02/28/2019  Does Patient Have a Medical Advance Directive? Yes  Type of Advance Directive Out of facility DNR (pink MOST or yellow form)  Does patient want to make changes to medical advance directive? No - Patient declined  Copy of [place] in Chart? -  Pre-existing out of facility DNR order (yellow form or pink MOST form) Yellow form placed in chart (order not valid for inpatient use)   Chief Complaint  Patient presents with  . Acute Visit    Redness and Warmth in both legs    HPI:  Pt is a 86 y.o. female AL resident with h/o chronic venous insufficiency, obesity, MI/CAD, aortic insufficiency, pacemaker, prior subarachnoid hemorrhage, dementia, osteoporosis, urinary incontinence among others seen today for an acute visit for bilateral LE  erythema, warmth and swelling noted yesterday.  She denies pain or tenderness.  She's been afebrile.  She has chronic LE edema and has previously struggled with some fungal infection of the feet that put her at risk for cellulitis.  She wears compression hose for her venous insufficiency.  She does not eat any type of restricted diet and was finishing her breakfast this morning with high sodium meats, eggs, toast with butter.    Past Medical History:  Diagnosis Date  . Breast cancer ([place])   . Colon polyps   . Coronary artery disease 06/2006   STEMI inferior with BMS to mid RCA with mid basal inferior wall HK and luminal irrgularities elsewhere  . Fracture of femoral neck, right ([place]) 06/13/2011  . Fracture of humeral head, right, closed 06/13/2011  . GERD (gastroesophageal reflux disease)   . Hemorrhoids   . Hyperlipemia   . Hypertension   . LBBB (left bundle branch block)   . Macular degeneration   . Myocardial infarction ([place])   . Obstructive sleep apnea on CPAP   . Osteopenia   . Osteopenia   . Pacemaker 02/25/14  . Rhinitis   . Rotator cuff syndrome   . SAH (subarachnoid hemorrhage) ([place]) 02/22/14  . 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 Ulnar neuropathy   . Umbilical hernia   . Unstable gait    Past Surgical History:  Procedure Laterality Date  . BREAST LUMPECTOMY    . CATARACT EXTRACTION, BILATERAL     Per [facility]  . CORONARY ANGIOPLASTY WITH STENT PLACEMENT  06/2006   Mid RCA BMS  . HIP ARTHROPLASTY  06/06/2011   Procedure: ARTHROPLASTY BIPOLAR HIP;  Surgeon: Dione Plover  Aluisio;  Location: [facility];  Service: Orthopedics;  Laterality: Right;  . PERMANENT PACEMAKER INSERTION N/A 02/25/2014   Procedure: PERMANENT PACEMAKER INSERTION;  Surgeon: Evans Lance, MD;  [facility];  Service: Cardiovascular;  Laterality: N/A;  . SHOULDER SURGERY    . TONSILLECTOMY     Per [facility]   . WRIST SURGERY      Allergies  Allergen Reactions  .  Chlorpheniramine Anaphylaxis  . Codeine Anaphylaxis  . Nystatin Rash    redness  . Amlodipine     Per [facility]     Outpatient Encounter Medications as of 06/05/2019  Medication Sig  . aspirin EC 81 MG tablet Take 81 mg by mouth daily.  . Chlorhexidine Gluconate 4 % SOLN Apply topically. Special instructions: Chlorhexidine wash with baths twice weekly Once a morning on Tue, Sat @@ 7am and 3pm  . Cholecalciferol (VITAMIN D3) 2000 UNITS TABS Take 3,000 Units by mouth daily.   . Coenzyme Q10 (COQ10) 200 MG CAPS Take 1 tablet by mouth daily.  Marland Kitchen DEXTRAN 70-HYPROMELLOSE OP Place 1 drop into both eyes 2 (two) times daily as needed. For dry eyes  . donepezil (ARICEPT) 5 MG tablet Take 5 mg by mouth at bedtime.  . DULoxetine (CYMBALTA) 60 MG capsule Take 60 mg by mouth daily.  . furosemide (LASIX) 40 MG tablet Take 40 mg by mouth as needed for edema.  Marland Kitchen levothyroxine (SYNTHROID, LEVOTHROID) 25 MCG tablet Take 25 mcg by mouth daily before breakfast.   . losartan (COZAAR) 100 MG tablet Take 100 mg by mouth daily.  . miconazole (MICONAZOLE ANTIFUNGAL) 2 % cream Apply 1 application topically daily as needed.  . Multiple Vitamins-Minerals (PRESERVISION/LUTEIN) CAPS Take 1 capsule by mouth 2 (two) times daily.  . nebivolol (BYSTOLIC) 10 MG tablet Take 1 tablet (10 mg total) by mouth daily.  . nitroGLYCERIN (NITROSTAT) 0.4 MG SL tablet Place 1 tablet (0.4 mg total) under the tongue every 5 (five) minutes as needed for chest pain.  . potassium chloride SA (K-DUR,KLOR-CON) 20 MEQ tablet Take 20 mEq by mouth daily.   . vitamin B-12 (CYANOCOBALAMIN) 1000 MCG tablet Take 1,000 mcg by mouth daily.   No facility-administered encounter medications on file as of 06/05/2019.    Review of Systems  Constitutional: Negative for activity change, appetite change, chills, fatigue and fever.  HENT: Negative for congestion and sore throat.   Eyes: Negative for visual disturbance.  Respiratory: Negative  for chest tightness and shortness of breath.        Deconditioned  Cardiovascular: Positive for leg swelling. Negative for chest pain and palpitations.  Gastrointestinal: Negative for abdominal pain.  Genitourinary: Negative for dysuria.       Urinary incontinence  Musculoskeletal: Positive for arthralgias and gait problem. Negative for back pain.  Skin: Positive for color change.    Immunization History  Administered Date(s) Administered  . Influenza, High Dose Seasonal PF 02/18/2017, 03/22/2019  . Influenza,inj,Quad PF,6+ Mos 03/24/2018  . Influenza-Unspecified 03/04/2016  . Pneumococcal Conjugate-13 05/08/2014  . Pneumococcal Polysaccharide-23 10/08/2017  . Td 07/09/2002, 01/16/2013  . Zoster 06/30/2010  . Zoster Recombinat (Shingrix) 11/18/2017, 01/17/2018   Pertinent  Health Maintenance Due  Topic Date Due  . INFLUENZA VACCINE  Completed  . DEXA SCAN  Completed  . PNA vac Low Risk Adult  Completed   Fall Risk  12/21/2018 10/18/2018 06/14/2018 02/08/2018 02/02/2018  Falls in the past year? 0 0 0 No No  Number falls in  past yr: 0 0 0 - -  Injury with Fall? 0 0 0 - -  Risk Factor Category  - - - - -  Risk for fall due to : Impaired mobility - - - -  Follow up Falls evaluation completed;Falls prevention discussed - - - -   Functional Status Survey:    Vitals:   06/05/19 1155  BP: 138/86  Pulse: 63  Temp: 97.7 F (36.5 C)  TempSrc: Oral  SpO2: 93%  Weight: 234 lb (106.1 kg)  Height: 5\' 6"  (1.676 m)   Body mass index is 37.77 kg/m. Physical Exam Vitals and nursing note reviewed.  Constitutional:      General: She is not in acute distress.    Appearance: Normal appearance. She is obese. She is not toxic-appearing.  HENT:     Head: Normocephalic and atraumatic.  Cardiovascular:     Rate and Rhythm: Normal rate and regular rhythm.     Heart sounds: Murmur present.  Pulmonary:     Effort: Pulmonary effort is normal.     Breath sounds: Normal breath sounds. No  rales.  Abdominal:     General: Bowel sounds are normal.  Musculoskeletal:     Comments: Uses walker to ambulate  Skin:    General: Skin is warm and dry.     Capillary Refill: Capillary refill takes less than 2 seconds.     Comments: Erythema of bilateral LEs to mid-shin, warm, nontender, no skin ulcerations seen; was wearing compression hose that were pulled down to evaluate  Neurological:     General: No focal deficit present.     Mental Status: She is alert. Mental status is at baseline.  Psychiatric:        Mood and Affect: Mood normal.     Labs reviewed: Recent Labs    10/17/18 0700 03/01/19 0000  NA 143 140  K 4.5 4.2  BUN 14 16  CREATININE 0.8 0.8   Recent Labs    10/17/18 0700  AST 20  ALT 11  ALKPHOS 126*   Recent Labs    10/17/18 0700  WBC 8.1  HGB 13.0  HCT 39  PLT 272   Lab Results  Component Value Date   TSH 3.44 12/09/2017   Lab Results  Component Value Date   HGBA1C 6.1 03/01/2019   Lab Results  Component Value Date   CHOL 228 (A) 10/17/2018   HDL 48 10/17/2018   LDLCALC 140 10/17/2018   TRIG 201 (A) 10/17/2018    Significant Diagnostic Results in last 30 days:  CUP PACEART REMOTE DEVICE CHECK  Result Date: 05/07/2019 Scheduled remote reviewed.  Normal device function.  6 AMS longest 6 seconds. Next remote 91 days.  [facility]   Assessment/Plan 1. Bilateral cellulitis of lower leg -begin doxycycline 100mg  po bid x 10 days  -encourage yogurt daily while on abx -elevate feet at rest -cont compression hose -monitor erythema and warmth for improvement  2. Fungal skin disease -start using mycostatin on feet daily after bathing for a week (had prn order)  3. Venous insufficiency -elevate feet at rest, use compression hose--be sure they are washed daily  Family/ staff Communication: discussed with AL nurse  Labs/tests ordered:  No new  07/04/2019--keep regular [facility] clinic appt as scheduled  Reuel Lamadrid L. Marranda Arakelian, D.O. [facility] [address], [place] [address] Cell Phone (Mon-Fri 8am-5pm):  [phone] On Call:  [phone] & follow prompts after 5pm & weekends  Office Phone:  [phone] Office Fax:  [phone]

## 2019-06-05 NOTE — Telephone Encounter (Addendum)
  Sueanne Margarita, MD  Freada Bergeron, CMA  Gae Bon   Please correct the order for CPAP to 11cm H2O   Traci    Order placed for ResMed CPAP at 11cm H2O with heated humidity and mask of choice via community message to [facility].

## 2019-06-06 ENCOUNTER — Telehealth: Payer: Self-pay | Admitting: *Deleted

## 2019-06-06 NOTE — Telephone Encounter (Signed)

## 2019-06-07 ENCOUNTER — Ambulatory Visit: Payer: Medicare Other | Admitting: Physician Assistant

## 2019-06-09 NOTE — Progress Notes (Signed)
PPM remote 

## 2019-06-11 DIAGNOSIS — Z20828 Contact with and (suspected) exposure to other viral communicable diseases: Secondary | ICD-10-CM | POA: Diagnosis not present

## 2019-06-11 DIAGNOSIS — Z9189 Other specified personal risk factors, not elsewhere classified: Secondary | ICD-10-CM | POA: Diagnosis not present
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
## 2019-06-12 ENCOUNTER — Other Ambulatory Visit: Payer: Self-pay

## 2019-06-12 ENCOUNTER — Telehealth (INDEPENDENT_AMBULATORY_CARE_PROVIDER_SITE_OTHER): Payer: Medicare Other | Admitting: Cardiology

## 2019-06-12 ENCOUNTER — Encounter: Payer: Self-pay | Admitting: Cardiology

## 2019-06-12 ENCOUNTER — Telehealth: Payer: Self-pay | Admitting: *Deleted

## 2019-06-12 VITALS — Ht 66.0 in

## 2019-06-12 DIAGNOSIS — G4733 Obstructive sleep apnea (adult) (pediatric): Secondary | ICD-10-CM

## 2019-06-12 DIAGNOSIS — Z9989 Dependence on other enabling machines and devices: Secondary | ICD-10-CM

## 2019-06-12 DIAGNOSIS — I10 Essential (primary) hypertension: Secondary | ICD-10-CM

## 2019-06-12 DIAGNOSIS — E669 Obesity, unspecified: Secondary | ICD-10-CM

## 2019-06-12 NOTE — Telephone Encounter (Signed)
Order placed to [facility] via community message. 

## 2019-06-12 NOTE — Telephone Encounter (Signed)
Please order a Resmed CPAP with heated humidity and mask of choice on 11cm H2o and supplies. Followup virtual 10 weeks

## 2019-06-13 DIAGNOSIS — L820 Inflamed seborrheic keratosis: Secondary | ICD-10-CM | POA: Diagnosis not present

## 2019-06-13 DIAGNOSIS — L821 Other seborrheic keratosis: Secondary | ICD-10-CM | POA: Diagnosis not present

## 2019-06-18 DIAGNOSIS — Z20828 Contact with and (suspected) exposure to other viral communicable diseases: Secondary | ICD-10-CM | POA: Diagnosis not present

## 2019-06-18 DIAGNOSIS — Z9189 Other specified personal risk factors, not elsewhere classified: Secondary | ICD-10-CM | POA: Diagnosis not present

## 2019-06-25 DIAGNOSIS — Z9189 Other specified personal risk factors, not elsewhere classified: Secondary | ICD-10-CM | POA: Diagnosis not present

## 2019-06-25 DIAGNOSIS — Z20828 Contact with and (suspected) exposure to other viral communicable diseases: Secondary | ICD-10-CM | POA: Diagnosis not present

## 2019-07-03 DIAGNOSIS — Z23 Encounter for immunization: Secondary | ICD-10-CM | POA: Diagnosis not present

## 2019-07-04 ENCOUNTER — Non-Acute Institutional Stay: Payer: Medicare Other | Admitting: Internal Medicine

## 2019-07-04 ENCOUNTER — Encounter: Payer: Self-pay | Admitting: Internal Medicine

## 2019-07-04 ENCOUNTER — Other Ambulatory Visit: Payer: Self-pay

## 2019-07-04 VITALS — BP 138/70 | HR 65 | Temp 97.3°F | Ht 66.0 in | Wt 240.4 lb

## 2019-07-04 DIAGNOSIS — I5032 Chronic diastolic (congestive) heart failure: Secondary | ICD-10-CM

## 2019-07-04 DIAGNOSIS — B351 Tinea unguium: Secondary | ICD-10-CM

## 2019-07-04 DIAGNOSIS — F0150 Vascular dementia without behavioral disturbance: Secondary | ICD-10-CM

## 2019-07-04 DIAGNOSIS — I872 Venous insufficiency (chronic) (peripheral): Secondary | ICD-10-CM

## 2019-07-04 DIAGNOSIS — H04123 Dry eye syndrome of bilateral lacrimal glands: Secondary | ICD-10-CM | POA: Diagnosis not present

## 2019-07-04 HISTORY — DX: Vascular dementia, unspecified severity, without behavioral disturbance, psychotic disturbance, mood disturbance, and anxiety: F01.50

## 2019-07-04 MED ORDER — DONEPEZIL HCL 10 MG PO TABS: 10.0000 mg | ORAL_TABLET | Freq: Every day | ORAL | 11 refills | Status: DC

## 2019-07-04 NOTE — Progress Notes (Signed)
Location:  Occup[facility] psychologist of Service:  Clinic (12)  Provider: Giang Hemme L. Mariea Clonts, D.O., C.M.D.  Code Status: DNR Goals of Care:  Advanced Directives 07/04/2019  Does Patient Have a Medical Advance Directive? Yes  Type of Advance Directive [place]  Does patient want to make changes to medical advance directive? No - Patient declined  Copy of [place] in Chart? -  Pre-existing out of facility DNR order (yellow form or pink MOST form) -     Chief Complaint  Patient presents with  . Medical Management of Chronic Issues     4 month follow up, both feet toe pain,  weight , aging process and how to deal with it     HPI: Patient is a 86 y.o. female seen today for medical management of chronic diseases.    Thinks she's doing well, but wants to go home.  Thinks she's been here long enough.    Her toes are hurting.  Not that much.  Big toe is too big, she says.  Longer toe.  Also has considerable swelling of the feet.  Nails are thick on great toes--right is thicker than left.    She says her memory is pathetic.  She's glad she's not going to school b/c she'd flunk.    Past Medical History:  Diagnosis Date  . Breast cancer ([place])   . Colon polyps   . Coronary artery disease 06/2006   STEMI inferior with BMS to mid RCA with mid basal inferior wall HK and luminal irrgularities elsewhere  . Fracture of femoral neck, right ([place]) 06/13/2011  . Fracture of humeral head, right, closed 06/13/2011  . GERD (gastroesophageal reflux disease)   . Hemorrhoids   . Hyperlipemia   . Hypertension   . LBBB (left bundle branch block)   . Macular degeneration   . Myocardial infarction ([place])   . Obstructive sleep apnea on CPAP   . Osteopenia   . Osteopenia   . Pacemaker 02/25/14  . Rhinitis   . Rotator cuff syndrome   . SAH (subarachnoid hemorrhage) ([place]) 02/22/14  . 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 Ulnar neuropathy   . Umbilical hernia    . Unstable gait     Past Surgical History:  Procedure Laterality Date  . BREAST LUMPECTOMY    . CATARACT EXTRACTION, BILATERAL     Per [facility]  . CORONARY ANGIOPLASTY WITH STENT PLACEMENT  06/2006   Mid RCA BMS  . HIP ARTHROPLASTY  06/06/2011   Procedure: ARTHROPLASTY BIPOLAR HIP;  Surgeon: Gearlean Alf;  Location: [facility];  Service: Orthopedics;  Laterality: Right;  . PERMANENT PACEMAKER INSERTION N/A 02/25/2014   Procedure: PERMANENT PACEMAKER INSERTION;  Surgeon: Evans Lance, MD;  Location: [facility] CATH LAB;  Service: Cardiovascular;  Laterality: N/A;  . SHOULDER SURGERY    . TONSILLECTOMY     Per [facility]   . WRIST SURGERY      Allergies  Allergen Reactions  . Chlorpheniramine Anaphylaxis  . Codeine Anaphylaxis  . Nystatin Rash    redness  . Amlodipine     Per [facility]     Outpatient Encounter Medications as of 07/04/2019  Medication Sig  . aspirin EC 81 MG tablet Take 81 mg by mouth daily.  . Chlorhexidine Gluconate 4 % SOLN Apply topically. Special instructions: Chlorhexidine wash with baths twice weekly Once a morning on Tue, Sat @@ 7am and 3pm  .  Cholecalciferol (VITAMIN D3) 2000 UNITS TABS Take 3,000 Units by mouth daily.   . Coenzyme Q10 (COQ10) 200 MG CAPS Take 1 tablet by mouth daily.  Marland Kitchen DEXTRAN 70-HYPROMELLOSE OP Place 1 drop into both eyes 2 (two) times daily as needed. For dry eyes  . donepezil (ARICEPT) 5 MG tablet Take 5 mg by mouth at bedtime.  Marland Kitchen doxycycline (VIBRAMYCIN) 100 MG capsule Take 100 mg by mouth 2 (two) times daily.  . DULoxetine (CYMBALTA) 60 MG capsule Take 60 mg by mouth daily.  . furosemide (LASIX) 40 MG tablet Take 40 mg by mouth as needed for edema.  Marland Kitchen levothyroxine (SYNTHROID, LEVOTHROID) 25 MCG tablet Take 25 mcg by mouth daily before breakfast.   . losartan (COZAAR) 100 MG tablet Take 100 mg by mouth daily.  . miconazole (MICONAZOLE ANTIFUNGAL) 2 % cream Apply 1 application topically daily  as needed.  . nebivolol (BYSTOLIC) 10 MG tablet Take 1 tablet (10 mg total) by mouth daily.  . nitroGLYCERIN (NITROSTAT) 0.4 MG SL tablet Place 1 tablet (0.4 mg total) under the tongue every 5 (five) minutes as needed for chest pain.  . potassium chloride SA (K-DUR,KLOR-CON) 20 MEQ tablet Take 20 mEq by mouth daily.   . vitamin B-12 (CYANOCOBALAMIN) 1000 MCG tablet Take 1,000 mcg by mouth daily.   No facility-administered encounter medications on file as of 07/04/2019.    Review of Systems:  ROS  Health Maintenance  Topic Date Due  . TETANUS/TDAP  01/17/2023  . INFLUENZA VACCINE  Completed  . DEXA SCAN  Completed  . PNA vac Low Risk Adult  Completed    Physical Exam: Vitals:   07/04/19 0901  BP: 138/70  Pulse: 65  Temp: (!) 97.3 F (36.3 C)  SpO2: 96%  Weight: 240 lb 6.4 oz (109 kg)  Height: 5\' 6"  (1.676 m)   Body mass index is 38.8 kg/m. Physical Exam  Labs reviewed: Basic Metabolic Panel: Recent Labs    10/17/18 0700 03/01/19 0000  NA 143 140  K 4.5 4.2  BUN 14 16  CREATININE 0.8 0.8   Liver Function Tests: Recent Labs    10/17/18 0700  AST 20  ALT 11  ALKPHOS 126*   No results for input(s): LIPASE, AMYLASE in the last 8760 hours. No results for input(s): AMMONIA in the last 8760 hours. CBC: Recent Labs    10/17/18 0700  WBC 8.1  HGB 13.0  HCT 39  PLT 272   Lipid Panel: Recent Labs    10/17/18 0700  CHOL 228*  HDL 48  LDLCALC 140  TRIG 201*   Lab Results  Component Value Date   HGBA1C 6.1 03/01/2019    Assessment/Plan 1. Vascular dementia without behavioral disturbance (HCC) -increase aricept to 10mg  qhs -cont AL support thought she would benefit from memory care if she and her husband could be together   2. Onychomycosis -will arrange for her to see podiatry here for thickened great toenails and right great toe pain  3. Venous insufficiency -cont compression hose, elevate feet at rest, avoid high sodium snacks  4. Chronic  diastolic heart failure (HCC) -cont lasix 40mg  daily prn dyspnea, weight gain of fluid  5. Morbid obesity ([place]) -ongoing, has a great appetite and eats liberally  -was 234 lbs last time in Jan Filed Weights   07/04/19 0901  Weight: 240 lb 6.4 oz (109 kg)   6. Dry eyes -use dry eye drops--they're ordered prn  Labs/tests ordered:  Cbc, cmp, tsh, flp before  Next appt:  10/24/2019  Jourden Delmont L. Kendarious Gudino, D.O. [facility] [address],  [address] Cell Phone (Mon-Fri 8am-5pm):  [phone] On Call:  [phone] & follow prompts after 5pm & weekends Office Phone:  [phone] Office Fax:  [phone]

## 2019-07-05 ENCOUNTER — Telehealth: Payer: Self-pay | Admitting: Cardiology

## 2019-07-05 NOTE — Telephone Encounter (Signed)
   Primary Cardiologist: Cristopher Peru, MD  Chart reviewed as part of pre-operative protocol coverage. Given past medical history and time since last visit, based on ACC/AHA guidelines, [name] would be at acceptable risk for the planned procedure without further cardiovascular testing.   She has an RCRI class III risk, 6.6% risk of major cardiac event.  I will route this recommendation to the requesting party via Epic fax function and remove from pre-op pool.  Please call with questions.   [name]. [facility] [place] [address] Office [phone] Fax [phone]

## 2019-07-05 NOTE — Telephone Encounter (Signed)
1. What dental office are you calling from? [facility] - Dr. Loyal Gambler  2. What is your office phone number? [phone]  3. What is your fax number? [phone]  4. What type of procedure is the patient having performed? Tooth Extraction  5. What date is procedure scheduled or is the patient there now? TBD (if the patient is at the dentist's office question goes to their cardiologist if he/she is in the office.  If not, question should go to the DOD).   6. What is your question (ex. Antibiotics prior to procedure, holding medication-we need to know how long dentist wants pt to hold med)? Hayley, with Dr. Landry Corporal office is calling to request clearance for patient to have dental work. Hayley states that Dr. Loyal Gambler has also faxed clearance forms to the office. Patient is to take Amoxicillin antibiotic 500 MG (4 tablets 1 hour prior to tooth extraction)

## 2019-07-11 DIAGNOSIS — R635 Abnormal weight gain: Secondary | ICD-10-CM | POA: Diagnosis not present

## 2019-07-11 DIAGNOSIS — R2689 Other abnormalities of gait and mobility: Secondary | ICD-10-CM | POA: Diagnosis not present

## 2019-07-11 DIAGNOSIS — F0150 Vascular dementia without behavioral disturbance: Secondary | ICD-10-CM | POA: Diagnosis not present

## 2019-07-11 DIAGNOSIS — Z6837 Body mass index (BMI) 37.0-37.9, adult: Secondary | ICD-10-CM | POA: Diagnosis not present

## 2019-07-11 DIAGNOSIS — L570 Actinic keratosis: Secondary | ICD-10-CM | POA: Diagnosis not present

## 2019-07-11 DIAGNOSIS — L821 Other seborrheic keratosis: Secondary | ICD-10-CM | POA: Diagnosis not present

## 2019-07-11 DIAGNOSIS — D1801 Hemangioma of skin and subcutaneous tissue: Secondary | ICD-10-CM | POA: Diagnosis not present

## 2019-07-11 DIAGNOSIS — M1712 Unilateral primary osteoarthritis, left knee: Secondary | ICD-10-CM | POA: Diagnosis not present

## 2019-07-11 DIAGNOSIS — L03116 Cellulitis of left lower limb: Secondary | ICD-10-CM | POA: Diagnosis not present

## 2019-07-11 DIAGNOSIS — M62552 Muscle wasting and atrophy, not elsewhere classified, left thigh: Secondary | ICD-10-CM | POA: Diagnosis not present

## 2019-07-11 DIAGNOSIS — L820 Inflamed seborrheic keratosis: Secondary | ICD-10-CM | POA: Diagnosis not present

## 2019-07-16 DIAGNOSIS — R635 Abnormal weight gain: Secondary | ICD-10-CM | POA: Diagnosis not present

## 2019-07-16 DIAGNOSIS — M1712 Unilateral primary osteoarthritis, left knee: Secondary | ICD-10-CM | POA: Diagnosis not present

## 2019-07-16 DIAGNOSIS — M62552 Muscle wasting and atrophy, not elsewhere classified, left thigh: Secondary | ICD-10-CM | POA: Diagnosis not present

## 2019-07-16 DIAGNOSIS — L03116 Cellulitis of left lower limb: Secondary | ICD-10-CM | POA: Diagnosis not present

## 2019-07-16 DIAGNOSIS — Z6837 Body mass index (BMI) 37.0-37.9, adult: Secondary | ICD-10-CM | POA: Diagnosis not present

## 2019-07-16 DIAGNOSIS — R2689 Other abnormalities of gait and mobility: Secondary | ICD-10-CM | POA: Diagnosis not present

## 2019-07-19 DIAGNOSIS — R635 Abnormal weight gain: Secondary | ICD-10-CM | POA: Diagnosis not present

## 2019-07-19 DIAGNOSIS — M62552 Muscle wasting and atrophy, not elsewhere classified, left thigh: Secondary | ICD-10-CM | POA: Diagnosis not present

## 2019-07-19 DIAGNOSIS — R2689 Other abnormalities of gait and mobility: Secondary | ICD-10-CM | POA: Diagnosis not present

## 2019-07-19 DIAGNOSIS — Z6837 Body mass index (BMI) 37.0-37.9, adult: Secondary | ICD-10-CM | POA: Diagnosis not present

## 2019-07-19 DIAGNOSIS — L03116 Cellulitis of left lower limb: Secondary | ICD-10-CM | POA: Diagnosis not present

## 2019-07-19 DIAGNOSIS — M1712 Unilateral primary osteoarthritis, left knee: Secondary | ICD-10-CM | POA: Diagnosis not present

## 2019-07-23 DIAGNOSIS — R2689 Other abnormalities of gait and mobility: Secondary | ICD-10-CM | POA: Diagnosis not present

## 2019-07-23 DIAGNOSIS — Z6837 Body mass index (BMI) 37.0-37.9, adult: Secondary | ICD-10-CM | POA: Diagnosis not present

## 2019-07-23 DIAGNOSIS — L03116 Cellulitis of left lower limb: Secondary | ICD-10-CM | POA: Diagnosis not present

## 2019-07-23 DIAGNOSIS — R635 Abnormal weight gain: Secondary | ICD-10-CM | POA: Diagnosis not present

## 2019-07-23 DIAGNOSIS — F0150 Vascular dementia without behavioral disturbance: Secondary | ICD-10-CM | POA: Diagnosis not present

## 2019-07-23 DIAGNOSIS — M1712 Unilateral primary osteoarthritis, left knee: Secondary | ICD-10-CM | POA: Diagnosis not present

## 2019-07-23 DIAGNOSIS — M62552 Muscle wasting and atrophy, not elsewhere classified, left thigh: Secondary | ICD-10-CM | POA: Diagnosis not present

## 2019-07-27 DIAGNOSIS — L03116 Cellulitis of left lower limb: Secondary | ICD-10-CM | POA: Diagnosis not present

## 2019-07-27 DIAGNOSIS — Z6837 Body mass index (BMI) 37.0-37.9, adult: Secondary | ICD-10-CM | POA: Diagnosis not present

## 2019-07-27 DIAGNOSIS — R2689 Other abnormalities of gait and mobility: Secondary | ICD-10-CM | POA: Diagnosis not present

## 2019-07-27 DIAGNOSIS — M1712 Unilateral primary osteoarthritis, left knee: Secondary | ICD-10-CM | POA: Diagnosis not present

## 2019-07-27 DIAGNOSIS — R635 Abnormal weight gain: Secondary | ICD-10-CM | POA: Diagnosis not present

## 2019-07-27 DIAGNOSIS — M62552 Muscle wasting and atrophy, not elsewhere classified, left thigh: Secondary | ICD-10-CM | POA: Diagnosis not present

## 2019-08-06 ENCOUNTER — Ambulatory Visit (INDEPENDENT_AMBULATORY_CARE_PROVIDER_SITE_OTHER): Payer: Medicare Other | Admitting: *Deleted

## 2019-08-06 DIAGNOSIS — Z950 Presence of cardiac pacemaker: Secondary | ICD-10-CM

## 2019-08-07 LAB — CUP PACEART REMOTE DEVICE CHECK
Battery Remaining Longevity: 118 mo
Battery Remaining Percentage: 95.5 %
Battery Voltage: 2.98 V
Brady Statistic AP VP Percent: 69 %
Brady Statistic AP VS Percent: 1 %
Brady Statistic AS VP Percent: 30 %
Brady Statistic AS VS Percent: 1 %
Brady Statistic RA Percent Paced: 67 %
Brady Statistic RV Percent Paced: 99 %
Date Time Interrogation Session: 20210316011306
Implantable Lead Implant Date: 20151005
Implantable Lead Implant Date: 20151005
Implantable Lead Location: 753859
Implantable Lead Location: 753860
Implantable Pulse Generator Implant Date: 20151005
Lead Channel Impedance Value: 460 Ohm
Lead Channel Impedance Value: 600 Ohm
Lead Channel Pacing Threshold Amplitude: 0.75 V
Lead Channel Pacing Threshold Amplitude: 1 V
Lead Channel Pacing Threshold Pulse Width: 0.5 ms
Lead Channel Pacing Threshold Pulse Width: 0.5 ms
Lead Channel Sensing Intrinsic Amplitude: 12 mV
Lead Channel Sensing Intrinsic Amplitude: 5 mV
Lead Channel Setting Pacing Amplitude: 1 V
Lead Channel Setting Pacing Amplitude: 2 V
Lead Channel Setting Pacing Pulse Width: 0.5 ms
Lead Channel Setting Sensing Sensitivity: 4 mV
Pulse Gen Model: 2240
Pulse Gen Serial Number: 7666288

## 2019-08-23 NOTE — Telephone Encounter (Signed)
Fax came for patient form [place] ph# [phone] opt 8, fax # [phone] stating they had made several attempts to contact the patient to setup an appointment for them to pick up the equipment. No one has returned the calls. They will keep the record for 90 days of the prescription.

## 2019-08-30 NOTE — Telephone Encounter (Signed)
Patient's son Imaya Saunders called to say the patient has her cpap and he is working with [employer] for her supplies. Jed says direct all calls to him.

## 2019-09-04 DIAGNOSIS — L602 Onychogryphosis: Secondary | ICD-10-CM | POA: Diagnosis not present

## 2019-09-04 DIAGNOSIS — Q6689 Other  specified congenital deformities of feet: Secondary | ICD-10-CM | POA: Diagnosis not present

## 2019-10-09 ENCOUNTER — Non-Acute Institutional Stay (SKILLED_NURSING_FACILITY): Payer: Medicare Other | Admitting: Internal Medicine

## 2019-10-09 ENCOUNTER — Encounter: Payer: Self-pay | Admitting: Internal Medicine

## 2019-10-09 DIAGNOSIS — F3342 Major depressive disorder, recurrent, in full remission: Secondary | ICD-10-CM

## 2019-10-09 DIAGNOSIS — G4733 Obstructive sleep apnea (adult) (pediatric): Secondary | ICD-10-CM | POA: Diagnosis not present

## 2019-10-09 DIAGNOSIS — F0150 Vascular dementia without behavioral disturbance: Secondary | ICD-10-CM

## 2019-10-09 DIAGNOSIS — I872 Venous insufficiency (chronic) (peripheral): Secondary | ICD-10-CM

## 2019-10-09 DIAGNOSIS — I2510 Atherosclerotic heart disease of native coronary artery without angina pectoris: Secondary | ICD-10-CM

## 2019-10-09 DIAGNOSIS — I110 Hypertensive heart disease with heart failure: Secondary | ICD-10-CM

## 2019-10-09 DIAGNOSIS — Z66 Do not resuscitate: Secondary | ICD-10-CM | POA: Diagnosis not present

## 2019-10-09 DIAGNOSIS — I5032 Chronic diastolic (congestive) heart failure: Secondary | ICD-10-CM | POA: Diagnosis not present

## 2019-10-09 DIAGNOSIS — Z9989 Dependence on other enabling machines and devices: Secondary | ICD-10-CM

## 2019-10-09 DIAGNOSIS — R3981 Functional urinary incontinence: Secondary | ICD-10-CM | POA: Diagnosis not present

## 2019-10-09 DIAGNOSIS — M810 Age-related osteoporosis without current pathological fracture: Secondary | ICD-10-CM | POA: Diagnosis not present

## 2019-10-09 DIAGNOSIS — M1712 Unilateral primary osteoarthritis, left knee: Secondary | ICD-10-CM | POA: Diagnosis not present

## 2019-10-09 NOTE — Progress Notes (Signed)
Provider:  Rexene Edison. Mariea Clonts, D.O., C.M.D. Location:  [facility] Room Number: 124 Place of Service:  SNF (31)  PCP: Gayland Curry, DO Patient Care Team: Gayland Curry, DO as PCP - General (Geriatric Medicine) Sueanne Margarita, MD as PCP - Sleep Medicine (Sleep Medicine) Evans Lance, MD as PCP - Cardiology (Cardiology) Justice Britain, MD as Consulting Physician (Orthopedic Surgery) Gaynelle Arabian, MD as Consulting Physician (Orthopedic Surgery) Garvin Fila, MD as Consulting Physician (Neurology) Evans Lance, MD as Consulting Physician (Cardiology) Jettie Booze, MD as Consulting Physician (Interventional Cardiology) Shon Hough, MD as Consulting Physician (Ophthalmology)  Extended Emergency Contact Information Primary Emergency Contact: Brownlee,JED Address: [place], [place] Phone: [phone] Mobile Phone: [phone] Relation: Son Secondary Emergency Contact: [facility] Mobile Phone: [phone] Relation: Daughter  Code Status: DNR Goals of Care: Advanced Directive information Advanced Directives 10/09/2019  Does Patient Have a Medical Advance Directive? Yes  Type of Paramedic of [place];Out of facility DNR (pink MOST or yellow form)  Does patient want to make changes to medical advance directive? No - Patient declined  Copy of [place] in Chart? Yes - validated most recent copy scanned in chart (See row information)  Pre-existing out of facility DNR order (yellow form or pink MOST form) Pink MOST/Yellow Form most recent copy in chart - Physician notified to receive inpatient order      Chief Complaint  Patient presents with  . New Admit To SNF    New admit from [place] to SNF     HPI: Patient is a 86 y.o. female seen today for admission to SNF from [place] due to progressive dementia--she and her husband are moving  together.  They have been eager to share a space since their admission to [facility] where he was at first in memory care and [place] was in an [place] apt.  [name] has been requiring more assistance with incontinence care and her short-term memory has been failing her.  She remains pleasant and sociable.  Prior to the move, she and [name] spent much of their time sitting int he AL dining room very slowly eating their breakfast and chatting.  [name] gained quite a bit of weight in AL eating snacks and not moving much. She also has chronic knee OA, urinary incontinence, chronic diastolic chf, venous insufficiency, h/o MI with CAD, aortic insufficiency, htn, OSA, prior syncope with SAH, senile osteoporosis with prior right hip fx, rotator cuff disease, b12 deficiency anemia, personal h/o breast cancer, pacemaker, hyperlipidemia, and her vascular dementia which also may have a component of post-traumatic dementia, too with her prior head injury.  [name] ambulates with a rollator walker and chats with everyone as she goes.     Past Medical History:  Diagnosis Date  . Breast cancer ([place])   . Colon polyps   . Coronary artery disease 06/2006   STEMI inferior with BMS to mid RCA with mid basal inferior wall HK and luminal irrgularities elsewhere  . Fracture of femoral neck, right ([place]) 06/13/2011  . Fracture of humeral head, right, closed 06/13/2011  . GERD (gastroesophageal reflux disease)   . Hemorrhoids   . Hyperlipemia   . Hypertension   . LBBB (left bundle branch block)   . Macular degeneration   . Myocardial infarction ([place])   . Obstructive sleep apnea on CPAP   .  Osteopenia   . Osteopenia   . Pacemaker 02/25/14  . Rhinitis   . Rotator cuff syndrome   . SAH (subarachnoid hemorrhage) ([place]) 02/22/14  . 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 Ulnar neuropathy   . Umbilical hernia   . Unstable gait    Past Surgical History:  Procedure Laterality Date  . BREAST LUMPECTOMY    . CATARACT EXTRACTION,  BILATERAL     Per [facility]  . CORONARY ANGIOPLASTY WITH STENT PLACEMENT  06/2006   Mid RCA BMS  . HIP ARTHROPLASTY  06/06/2011   Procedure: ARTHROPLASTY BIPOLAR HIP;  Surgeon: Gearlean Alf;  Location: [facility] ORS;  Service: Orthopedics;  Laterality: Right;  . PERMANENT PACEMAKER INSERTION N/A 02/25/2014   Procedure: PERMANENT PACEMAKER INSERTION;  Surgeon: Evans Lance, MD;  Location: [facility] CATH LAB;  Service: Cardiovascular;  Laterality: N/A;  . SHOULDER SURGERY    . TONSILLECTOMY     Per [facility]   . WRIST SURGERY      Social History   Socioeconomic History  . Marital status: Married    Spouse name: Not on file  . Number of children: Not on file  . Years of education: Not on file  . Highest education level: Not on file  Occupational History  . Not on file  Tobacco Use  . Smoking status: Never Smoker  . Smokeless tobacco: Never Used  Substance and Sexual Activity  . Alcohol use: Yes    Alcohol/week: 0.0 standard drinks    Comment: occasional wine  . Drug use: Never  . Sexual activity: Not on file  Other Topics Concern  . Not on file  Social History Narrative  . Not on file   Social Determinants of Health   Financial Resource Strain:   . Difficulty of Paying Living Expenses:   Food Insecurity:   . Worried About Charity fundraiser in the Last Year:   . Arboriculturist in the Last Year:   Transportation Needs:   . Film/video editor (Medical):   Marland Kitchen Lack of Transportation (Non-Medical):   Physical Activity:   . Days of Exercise per Week:   . Minutes of Exercise per Session:   Stress:   . Feeling of Stress :   Social Connections:   . Frequency of Communication with Friends and Family:   . Frequency of Social Gatherings with Friends and Family:   . Attends Religious Services:   . Active Member of Clubs or Organizations:   . Attends Archivist Meetings:   Marland Kitchen Marital Status:     reports that she has never smoked. She has  never used smokeless tobacco. She reports current alcohol use. She reports that she does not use drugs.  Functional Status Survey:    Family History  Problem Relation Age of Onset  . Heart attack Father 63  . CAD Father   . CVA Sister     Health Maintenance  Topic Date Due  . COVID-19 Vaccine (2 - Moderna 2-dose series) 07/02/2019  . INFLUENZA VACCINE  12/23/2019  . TETANUS/TDAP  01/17/2023  . DEXA SCAN  Completed  . PNA vac Low Risk Adult  Completed    Allergies  Allergen Reactions  . Chlorpheniramine Anaphylaxis  . Codeine Anaphylaxis  . Nystatin Rash    redness  . Amlodipine     Per [facility] Records     Outpatient Encounter Medications as of 10/09/2019  Medication Sig  . acetaminophen (TYLENOL) 500  MG tablet Take 500 mg by mouth every 6 (six) hours as needed.  Marland Kitchen aspirin EC 81 MG tablet Take 81 mg by mouth daily.  . Cholecalciferol (VITAMIN D3) 2000 UNITS TABS Take 3,000 Units by mouth daily.   . Coenzyme Q10 (COQ10) 200 MG CAPS Take 1 tablet by mouth daily.  Marland Kitchen Dextran 70-Hypromellose 0.1-0.3 % SOLN Apply to eye.  . donepezil (ARICEPT) 10 MG tablet Take 1 tablet (10 mg total) by mouth at bedtime.  . DULoxetine (CYMBALTA) 60 MG capsule Take 60 mg by mouth daily.  . furosemide (LASIX) 40 MG tablet Take 40 mg by mouth as needed for edema.  Marland Kitchen levothyroxine (SYNTHROID, LEVOTHROID) 25 MCG tablet Take 25 mcg by mouth daily before breakfast.   . losartan (COZAAR) 100 MG tablet Take 100 mg by mouth daily.  . mineral oil-hydrophilic petrolatum (AQUAPHOR) ointment Apply topically as needed for dry skin.  . Multiple Vitamins-Minerals (PRESERVISION AREDS 2+MULTI VIT PO) Take by mouth.  . nebivolol (BYSTOLIC) 10 MG tablet Take 1 tablet (10 mg total) by mouth daily.  . nitroGLYCERIN (NITRODUR - DOSED IN MG/24 HR) 0.4 mg/hr patch Place 0.4 mg onto the skin daily.  . potassium chloride SA (K-DUR,KLOR-CON) 20 MEQ tablet Take 20 mEq by mouth daily.   . sodium fluoride (PREVIDENT  5000 PLUS) 1.1 % CREA dental cream Place 1 application onto teeth every evening.  . vitamin B-12 (CYANOCOBALAMIN) 1000 MCG tablet Take 1,000 mcg by mouth daily.  . [DISCONTINUED] Chlorhexidine Gluconate 4 % SOLN Apply topically. Special instructions: Chlorhexidine wash with baths twice weekly Once a morning on Tue, Sat @@ 7am and 3pm  . [DISCONTINUED] DEXTRAN 70-HYPROMELLOSE OP Place 1 drop into both eyes 2 (two) times daily as needed. For dry eyes  . [DISCONTINUED] doxycycline (VIBRAMYCIN) 100 MG capsule Take 100 mg by mouth 2 (two) times daily.  . [DISCONTINUED] miconazole (MICONAZOLE ANTIFUNGAL) 2 % cream Apply 1 application topically daily as needed.  . [DISCONTINUED] nitroGLYCERIN (NITROSTAT) 0.4 MG SL tablet Place 1 tablet (0.4 mg total) under the tongue every 5 (five) minutes as needed for chest pain.   No facility-administered encounter medications on file as of 10/09/2019.    Review of Systems  Constitutional: Negative for chills, fever and malaise/fatigue.  HENT: Positive for hearing loss. Negative for congestion and sore throat.   Eyes: Negative for blurred vision.       Reading glasses  Respiratory: Negative for cough, sputum production, shortness of breath and wheezing.        Dyspnea on exertion  Cardiovascular: Positive for leg swelling. Negative for chest pain, palpitations, orthopnea and PND.       Dyspnea on exertion, deconditioning  Gastrointestinal: Negative for abdominal pain, blood in stool, constipation, diarrhea, melena, nausea and vomiting.  Genitourinary: Negative for dysuria.       Incontinence--uses adult undergarments  Musculoskeletal: Positive for joint pain. Negative for falls.  Skin: Negative for itching and rash.       H/o recurrent cellulitis and vaginal candidiasis  Neurological: Negative for dizziness and loss of consciousness.  Endo/Heme/Allergies: Bruises/bleeds easily.  Psychiatric/Behavioral: Positive for memory loss. Negative for depression. The  patient is not nervous/anxious and does not have insomnia.        Depression in remission--seems spirits even better now that she and [name] are sharing space    Vitals:   10/09/19 1049  BP: 128/68  Pulse: 73  Temp: (!) 97.3 F (36.3 C)  Weight: 241 lb 9.6 oz (109.6 kg)  Height: '5\' 6"'$  (1.676 m)   Body mass index is 39 kg/m. Physical Exam Vitals reviewed.  Constitutional:      General: She is not in acute distress.    Appearance: She is obese. She is not ill-appearing or toxic-appearing.  HENT:     Head: Normocephalic and atraumatic.     Right Ear: External ear normal.     Left Ear: External ear normal.     Nose: Nose normal.     Mouth/Throat:     Pharynx: Oropharynx is clear.  Eyes:     Extraocular Movements: Extraocular movements intact.     Conjunctiva/sclera: Conjunctivae normal.     Pupils: Pupils are equal, round, and reactive to light.  Cardiovascular:     Rate and Rhythm: Normal rate and regular rhythm.     Heart sounds: Murmur present.  Pulmonary:     Effort: Pulmonary effort is normal.     Breath sounds: No wheezing, rhonchi or rales.  Abdominal:     General: Bowel sounds are normal. There is no distension.     Palpations: Abdomen is soft. There is no mass.     Tenderness: There is no abdominal tenderness. There is no right CVA tenderness, left CVA tenderness, guarding or rebound.  Musculoskeletal:     Cervical back: Neck supple.     Right lower leg: Edema present.     Left lower leg: Edema present.  Lymphadenopathy:     Cervical: No cervical adenopathy.  Skin:    General: Skin is warm and dry.     Capillary Refill: Capillary refill takes less than 2 seconds.     Comments: Some hyperpigmentation of lower legs and feet (venous insufficiency)  Neurological:     General: No focal deficit present.     Mental Status: She is alert.     Cranial Nerves: No cranial nerve deficit.     Sensory: No sensory deficit.     Motor: No weakness.     Coordination:  Coordination normal.     Gait: Gait abnormal.     Deep Tendon Reflexes: Reflexes normal.     Comments: Shuffles some was she ambulates with her walker; short-term memory loss with repetition visit to visit and some over the course of a visit now  Psychiatric:        Mood and Affect: Mood normal.        Behavior: Behavior normal.     Labs reviewed: Basic Metabolic Panel: Recent Labs    03/01/19 0000  NA 140  K 4.2  BUN 16  CREATININE 0.8   Liver Function Tests: No results for input(s): AST, ALT, ALKPHOS, BILITOT, PROT, ALBUMIN in the last 8760 hours. No results for input(s): LIPASE, AMYLASE in the last 8760 hours. No results for input(s): AMMONIA in the last 8760 hours. CBC: No results for input(s): WBC, NEUTROABS, HGB, HCT, MCV, PLT in the last 8760 hours. Cardiac Enzymes: No results for input(s): CKTOTAL, CKMB, CKMBINDEX, TROPONINI in the last 8760 hours. BNP: Invalid input(s): POCBNP Lab Results  Component Value Date   HGBA1C 6.1 03/01/2019   Lab Results  Component Value Date   TSH 3.44 12/09/2017   Lab Results  Component Value Date   GLOVFIEP32 951 07/28/2017   No results found for: FOLATE No results found for: IRON, TIBC, FERRITIN  Imaging and Procedures obtained prior to SNF admission: DG Chest 2 View  Result Date: 07/29/2017 CLINICAL DATA:  Congestive heart failure.  Shortness of breath. EXAM: CHEST -  2 VIEW COMPARISON:  Radiographs of February 26, 2014. FINDINGS: Stable cardiomegaly. Left-sided pacemaker is unchanged in position. Atherosclerosis of thoracic aorta is noted. No pneumothorax or pleural effusion is noted. No acute pulmonary disease is noted. Narrowing of right subacromial space is noted suggesting rotator cuff injury. IMPRESSION: No acute cardiopulmonary abnormality seen. Aortic Atherosclerosis (ICD10-I70.0). Electronically Signed   By: Marijo Conception, M.D.   On: 07/29/2017 16:53   CT HEAD WO CONTRAST  Result Date: 07/29/2017 CLINICAL DATA:  Memory  loss with occasional vertebral and bilateral extremity weakness EXAM: CT HEAD WITHOUT CONTRAST TECHNIQUE: Contiguous axial images were obtained from the base of the skull through the vertex without intravenous contrast. COMPARISON:  MRI 02/23/2014, CT brain 02/22/2014 FINDINGS: Brain: No acute territorial infarction, hemorrhage or intracranial mass is visualized. Moderate atrophy. Moderate small vessel ischemic changes of the white matter, slight progression since prior CT. Stable ventricle size. Vascular: No hyperdense vessel.  Carotid vascular calcification. Skull: No fracture Sinuses/Orbits: Mild mucosal thickening in the maxillary and ethmoid sinuses. No acute orbital abnormality. Other: None IMPRESSION: 1. No CT evidence for acute intracranial abnormality. 2. Moderate atrophy with moderate small vessel ischemic changes of the white matter, slight progression since previous CT from 2015. Electronically Signed   By: Donavan Foil M.D.   On: 07/29/2017 19:19   US Carotid Bilateral  Result Date: 07/29/2017 CLINICAL DATA:  CHF.  Carotid atherosclerosis.  Memory impairment. EXAM: BILATERAL CAROTID DUPLEX ULTRASOUND TECHNIQUE: Pearline Cables scale imaging, color Doppler and duplex ultrasound were performed of bilateral carotid and vertebral arteries in the neck. COMPARISON:  None. FINDINGS: Criteria: Quantification of carotid stenosis is based on velocity parameters that correlate the residual internal carotid diameter with NASCET-based stenosis levels, using the diameter of the distal internal carotid lumen as the denominator for stenosis measurement. The following velocity measurements were obtained: RIGHT ICA:  171 cm/sec CCA:  95 cm/sec SYSTOLIC ICA/CCA RATIO:  1.8 DIASTOLIC ICA/CCA RATIO:  2.0 ECA:  287 cm/sec LEFT ICA:  100 cm/sec CCA:  161 cm/sec SYSTOLIC ICA/CCA RATIO:  0.8 DIASTOLIC ICA/CCA RATIO:  1.6 ECA:  127 cm/sec RIGHT CAROTID ARTERY: Echogenic plaque at the right carotid bulb. Posterior acoustic shadowing  from calcified plaque at the carotid bulb. Elevated peak systolic velocity in the external carotid artery. Peak systolic velocity in the mid internal carotid artery is elevated, measuring 171 centimeters/second. RIGHT VERTEBRAL ARTERY: Antegrade flow in the right vertebral artery. Elevated peak systolic velocity, greater than 100 centimeters/second, of unknown significance. LEFT CAROTID ARTERY: Echogenic plaque at the left carotid bulb. Left common carotid artery is tortuous. External carotid artery is patent with normal waveform. Small amount of echogenic plaque in the proximal internal carotid artery. Normal waveforms and velocities in the internal carotid artery. LEFT VERTEBRAL ARTERY: Antegrade flow and normal waveform in the left vertebral artery. IMPRESSION: Atherosclerotic disease in the carotid arteries, most prominent at the right carotid bulb. Estimated degree of stenosis in the right internal carotid artery is 50-69%. Estimated degree of stenosis in the left internal carotid artery is less than 50%. Right external carotid artery stenosis. Patent vertebral arteries with antegrade flow. Electronically Signed   By: Markus Daft M.D.   On: 07/29/2017 18:09    Assessment/Plan 1. Vascular dementia without behavioral disturbance ([place]) Move [place] SNF from [place] so she and her husband can share a room (long awaited) and due to progression of her dementia and ADL needs Needs updated MMSE on file Cont bp control, asa, not on statin at her  advanced age, cont aricept '10mg'$  qhs due to continued participation in [place] (though needs help) and activities/socialization  2. Recurrent major depression in full remission ([place]) -doing well with cymbalta '60mg'$  and seems this has helped some with aches and pains, though she did need to see ortho for her knee and has had steroid injections (which did not help much before or recently and she'd been sent to get cartilage injections instead)  3. Hypertensive heart disease with  chronic diastolic congestive heart failure ([place]) -bp has been well-controlled, cont current regimen and monitor  4. Chronic diastolic heart failure ([facility]) -has been euvolemic -had a lot of non-fluid weight gain due to frequent snacks and very large meals with minimal mobility -note she also has venous insufficiency  -has lasix only prn for weight gain, increased edema or dyspnea  5. Morbid obesity ([place]) -wt was 208 October of 2015 and now 241lbs due to minimal activity and eating frequent snacks -will encourage more activity  -had ordered small portions historically, but appears this was not a success  6. Venous insufficiency -elevate feet at rest, compression hose on in day and off at night with frequent cleaning to avoid fungal and yeast skin infections she is prone to getting  7. Functional urinary incontinence -ongoing, was historically treated for OAB, but problems had continued despite meds and determined to be more functional from her dementia -cont assistance with personal hygiene, frequent adult undergarment changes and use of barrier creams  8. Localized osteoarthritis of left knee -followed by ortho--she'd had injections in years past without benefit, and was referred for hyalgan or similar cartilage injections, but given steroid injections again instead--no clear benefit noted  9. Obstructive sleep apnea on CPAP -continue nightly CPAP (staff to assist) and use facility CPAP cleaning protocol   10. Atherosclerosis of native coronary artery of native heart without angina pectoris -no difficulty with chest pains, cont  Baby asa, ntg patch, bp control with bystolic and cozaar -has pacer in place--last check on file 08/06/19 with [facility]; cardiologist--Dr. Radford Pax who also manages her CPAP  11. Age-related osteoporosis without current pathological fracture -maintained on 3000 units of Vitamin D3 daily; encourage walking with walker; not on other med for osteoporosis at this  time  Family/ staff Communication: discussed with her daughter who was helping them move into their apt  Labs/tests ordered:  No new ordered, but should have full set in near future   Zakariyya Helfman L. Jadaya Sommerfield, D.O. [facility] [address], [place] [address] Cell Phone (Mon-Fri 8am-5pm):  [phone] On Call:  [phone] & follow prompts after 5pm & weekends Office Phone:  [phone] Office Fax:  [phone]

## 2019-10-18 DIAGNOSIS — E785 Hyperlipidemia, unspecified: Secondary | ICD-10-CM | POA: Diagnosis not present

## 2019-10-18 DIAGNOSIS — E119 Type 2 diabetes mellitus without complications: Secondary | ICD-10-CM | POA: Diagnosis not present

## 2019-10-18 DIAGNOSIS — D649 Anemia, unspecified: Secondary | ICD-10-CM | POA: Diagnosis not present

## 2019-10-18 DIAGNOSIS — I10 Essential (primary) hypertension: Secondary | ICD-10-CM | POA: Diagnosis not present

## 2019-10-18 DIAGNOSIS — E039 Hypothyroidism, unspecified: Secondary | ICD-10-CM | POA: Diagnosis not present

## 2019-10-18 LAB — BASIC METABOLIC PANEL
BUN: 13 (ref 4–21)
CO2: 24 — AB (ref 13–22)
Chloride: 102 (ref 99–108)
Creatinine: 0.8 (ref 0.5–1.1)
Glucose: 97
Potassium: 3.9 (ref 3.4–5.3)
Sodium: 139 (ref 137–147)

## 2019-10-18 LAB — HEPATIC FUNCTION PANEL
ALT: 9 (ref 7–35)
AST: 17 (ref 13–35)
Alkaline Phosphatase: 103 (ref 25–125)
Bilirubin, Total: 0.5

## 2019-10-18 LAB — CBC AND DIFFERENTIAL
HCT: 35 — AB (ref 36–46)
Hemoglobin: 11.6 — AB (ref 12.0–16.0)
Neutrophils Absolute: 3
Platelets: 232 (ref 150–399)
WBC: 6.9

## 2019-10-18 LAB — LIPID PANEL
Cholesterol: 200 (ref 0–200)
HDL: 44 (ref 35–70)
LDL Cholesterol: 129
LDl/HDL Ratio: 4.5
Triglycerides: 137 (ref 40–160)

## 2019-10-18 LAB — CBC: RBC: 4.03 (ref 3.87–5.11)

## 2019-10-18 LAB — COMPREHENSIVE METABOLIC PANEL
Albumin: 3.7 (ref 3.5–5.0)
Calcium: 9.2 (ref 8.7–10.7)

## 2019-10-18 LAB — TSH: TSH: 3.77 (ref 0.41–5.90)

## 2019-10-22 DIAGNOSIS — F3341 Major depressive disorder, recurrent, in partial remission: Secondary | ICD-10-CM | POA: Insufficient documentation

## 2019-10-24 ENCOUNTER — Encounter: Payer: Self-pay | Admitting: Internal Medicine

## 2019-11-01 ENCOUNTER — Encounter: Payer: Self-pay | Admitting: Adult Health

## 2019-11-01 ENCOUNTER — Non-Acute Institutional Stay (SKILLED_NURSING_FACILITY): Payer: Medicare Other | Admitting: Adult Health

## 2019-11-01 DIAGNOSIS — I5032 Chronic diastolic (congestive) heart failure: Secondary | ICD-10-CM | POA: Diagnosis not present

## 2019-11-01 DIAGNOSIS — G4733 Obstructive sleep apnea (adult) (pediatric): Secondary | ICD-10-CM | POA: Diagnosis not present

## 2019-11-01 DIAGNOSIS — E669 Obesity, unspecified: Secondary | ICD-10-CM | POA: Diagnosis not present

## 2019-11-01 DIAGNOSIS — F0150 Vascular dementia without behavioral disturbance: Secondary | ICD-10-CM

## 2019-11-01 DIAGNOSIS — I10 Essential (primary) hypertension: Secondary | ICD-10-CM | POA: Diagnosis not present

## 2019-11-01 DIAGNOSIS — E039 Hypothyroidism, unspecified: Secondary | ICD-10-CM | POA: Diagnosis not present

## 2019-11-01 DIAGNOSIS — Z9989 Dependence on other enabling machines and devices: Secondary | ICD-10-CM

## 2019-11-01 NOTE — Progress Notes (Signed)
Location:  Occupational psychologist of Service:  SNF (31) Provider:   Cindi Carbon, ANP [place] [phone]  Gayland Curry, DO  Patient Care Team: Gayland Curry, DO as PCP - General (Geriatric Medicine) Sueanne Margarita, MD as PCP - Sleep Medicine (Sleep Medicine) Evans Lance, MD as PCP - Cardiology (Cardiology) Justice Britain, MD as Consulting Physician (Orthopedic Surgery) Gaynelle Arabian, MD as Consulting Physician (Orthopedic Surgery) Garvin Fila, MD as Consulting Physician (Neurology) Evans Lance, MD as Consulting Physician (Cardiology) Jettie Booze, MD as Consulting Physician (Interventional Cardiology) Shon Hough, MD as Consulting Physician (Ophthalmology) Community, [facility] ([place])  Extended Emergency Contact Information Primary Emergency Contact: Foiles,JED Address: [address], [place] Phone: [phone] Mobile Phone: [phone] Relation: Son Secondary Emergency Contact: [place] Mobile Phone: [phone] Relation: Daughter  Code Status:  DNR Goals of care: Advanced Directive information Advanced Directives 10/09/2019  Does Patient Have a Medical Advance Directive? Yes  Type of Paramedic of [place];Out of facility DNR (pink MOST or yellow form)  Does patient want to make changes to medical advance directive? No - Patient declined  Copy of [place] in Chart? Yes - validated most recent copy scanned in chart (See row information)  Pre-existing out of facility DNR order (yellow form or pink MOST form) Pink MOST/Yellow Form most recent copy in chart - Physician notified to receive inpatient order     Chief Complaint  Patient presents with  . Medical Management of Chronic Issues    HPI:  Pt is a 86 y.o. female seen today for medical management of chronic diseases.   She recently moved to skilled care due to advancing dementia with increased care needs. She is asking "when she can go home".  She does not remember living in [place] and her husband told her in front of me that they sold their home years ago and this seemed to upset her. Her most recent MMSE score is 28/30 on 10/11/19.  She remains ambulatory with a walker but is moving slower due to weight gain and some knee pain which she says has improved. She has chronic edema in her legs which is unchanged and she wears compression hose daily. She does not have any sob or cp. She continues to use her cpap machine. She was going through a period of weight gain and was up to 241 lbs but now is down 7 lbs, now sure if this was due to a change in scale or a true loss.  Wt Readings from Last 3 Encounters:  11/01/19 236 lb 12.8 oz (107.4 kg)  10/09/19 241 lb 9.6 oz (109.6 kg)  07/04/19 240 lb 6.4 oz (109 kg)      Past Medical History:  Diagnosis Date  . Breast cancer ([place])   . Colon polyps   . Coronary artery disease 06/2006   STEMI inferior with BMS to mid RCA with mid basal inferior wall HK and luminal irrgularities elsewhere  . Fracture of femoral neck, right ([place]) 06/13/2011  . Fracture of humeral head, right, closed 06/13/2011  . GERD (gastroesophageal reflux disease)   . Hemorrhoids   . Hyperlipemia   . Hypertension   . LBBB (left bundle branch block)   . Macular degeneration   . Myocardial infarction ([place])   . Obstructive sleep apnea on CPAP   .  Osteopenia   . Osteopenia   . Pacemaker 02/25/14  . Rhinitis   . Rotator cuff syndrome   . SAH (subarachnoid hemorrhage) ([place]) 02/22/14  . 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 Ulnar neuropathy   . Umbilical hernia   . Unstable gait    Past Surgical History:  Procedure Laterality Date  . BREAST LUMPECTOMY    . CATARACT EXTRACTION, BILATERAL     Per [facility]  . CORONARY ANGIOPLASTY WITH STENT PLACEMENT  06/2006   Mid RCA BMS  . HIP ARTHROPLASTY   06/06/2011   Procedure: ARTHROPLASTY BIPOLAR HIP;  Surgeon: Gearlean Alf;  Location: [facility]S;  Service: Orthopedics;  Laterality: Right;  . PERMANENT PACEMAKER INSERTION N/A 02/25/2014   Procedure: PERMANENT PACEMAKER INSERTION;  Surgeon: Evans Lance, MD;  [facility];  Service: Cardiovascular;  Laterality: N/A;  . SHOULDER SURGERY    . TONSILLECTOMY     Per [facility]   . WRIST SURGERY      Allergies  Allergen Reactions  . Chlorpheniramine Anaphylaxis  . Codeine Anaphylaxis  . Nystatin Rash    redness  . Amlodipine     Per [facility] Records     Outpatient Encounter Medications as of 11/01/2019  Medication Sig  . acetaminophen (TYLENOL) 500 MG tablet Take 500 mg by mouth every 8 (eight) hours as needed.   Marland Kitchen aspirin EC 81 MG tablet Take 81 mg by mouth daily.  . Cholecalciferol (VITAMIN D3) 2000 UNITS TABS Take 3,000 Units by mouth daily.   . Coenzyme Q10 (COQ10) 200 MG CAPS Take 1 tablet by mouth daily.  Marland Kitchen donepezil (ARICEPT) 10 MG tablet Take 1 tablet (10 mg total) by mouth at bedtime.  . DULoxetine (CYMBALTA) 60 MG capsule Take 60 mg by mouth daily.  . furosemide (LASIX) 40 MG tablet Take 40 mg by mouth daily. And prn edema  . levothyroxine (SYNTHROID, LEVOTHROID) 25 MCG tablet Take 25 mcg by mouth daily before breakfast.   . losartan (COZAAR) 100 MG tablet Take 100 mg by mouth daily.  . mineral oil-hydrophilic petrolatum (AQUAPHOR) ointment Apply topically as needed for dry skin.  . Multiple Vitamins-Minerals (PRESERVISION AREDS 2+MULTI VIT PO) Take by mouth.  . nebivolol (BYSTOLIC) 10 MG tablet Take 1 tablet (10 mg total) by mouth daily.  . nitroGLYCERIN (NITROSTAT) 0.4 MG SL tablet Place 0.4 mg under the tongue every 5 (five) minutes as needed for chest pain.  . potassium chloride SA (K-DUR,KLOR-CON) 20 MEQ tablet Take 20 mEq by mouth daily.   . sodium fluoride (PREVIDENT 5000 PLUS) 1.1 % CREA dental cream Place 1 application onto teeth every  evening.  . vitamin B-12 (CYANOCOBALAMIN) 1000 MCG tablet Take 1,000 mcg by mouth daily.  . [DISCONTINUED] Dextran 70-Hypromellose 0.1-0.3 % SOLN Apply to eye.  . [DISCONTINUED] nitroGLYCERIN (NITRODUR - DOSED IN MG/24 HR) 0.4 mg/hr patch Place 0.4 mg onto the skin daily.   No facility-administered encounter medications on file as of 11/01/2019.    Review of Systems  Constitutional: Positive for unexpected weight change. Negative for activity change, appetite change, chills, diaphoresis, fatigue and fever.  HENT: Negative for congestion.   Respiratory: Negative for cough, shortness of breath and wheezing.   Cardiovascular: Positive for leg swelling. Negative for chest pain and palpitations.  Gastrointestinal: Negative for abdominal distention, abdominal pain, constipation and diarrhea.  Genitourinary: Negative for difficulty urinating and dysuria.  Musculoskeletal: Positive for arthralgias and gait problem. Negative for back pain, joint swelling and  myalgias.  Neurological: Negative for dizziness, tremors, seizures, syncope, facial asymmetry, speech difficulty, weakness, light-headedness, numbness and headaches.  Psychiatric/Behavioral: Positive for confusion. Negative for agitation and behavioral problems.    Immunization History  Administered Date(s) Administered  . Influenza, High Dose Seasonal PF 02/18/2017, 03/22/2019  . Influenza,inj,Quad PF,6+ Mos 03/24/2018  . Influenza-Unspecified 03/04/2016  . Moderna SARS-COVID-2 Vaccination 06/04/2019, 07/03/2019  . Pneumococcal Conjugate-13 05/08/2014  . Pneumococcal Polysaccharide-23 10/08/2017  . Td 07/09/2002, 01/16/2013  . Zoster 06/30/2010  . Zoster Recombinat (Shingrix) 11/18/2017, 01/17/2018   Pertinent  Health Maintenance Due  Topic Date Due  . INFLUENZA VACCINE  12/23/2019  . DEXA SCAN  Completed  . PNA vac Low Risk Adult  Completed   Fall Risk  07/04/2019 12/21/2018 10/18/2018 06/14/2018 02/08/2018  Falls in the past year? 0 0  0 0 No  Number falls in past yr: 0 0 0 0 -  Injury with Fall? 0 0 0 0 -  Risk Factor Category  - - - - -  Risk for fall due to : - Impaired mobility - - -  Follow up - Falls evaluation completed;Falls prevention discussed - - -   Functional Status Survey:    Vitals:   11/01/19 1100  BP: (!) 132/57  Pulse: 66  Resp: (!) 21  Temp: (!) 96.2 F (35.7 C)  SpO2: 95%  Weight: 236 lb 12.8 oz (107.4 kg)   Body mass index is 38.22 kg/m.  Wt Readings from Last 3 Encounters:  11/01/19 236 lb 12.8 oz (107.4 kg)  10/09/19 241 lb 9.6 oz (109.6 kg)  07/04/19 240 lb 6.4 oz (109 kg)    Physical Exam Vitals and nursing note reviewed.  Constitutional:      General: She is not in acute distress.    Appearance: She is not diaphoretic.     Comments: obese  HENT:     Head: Normocephalic and atraumatic.     Mouth/Throat:     Mouth: Mucous membranes are moist.     Pharynx: Oropharynx is clear.  Neck:     Vascular: No JVD.  Cardiovascular:     Rate and Rhythm: Normal rate and regular rhythm.     Heart sounds: No murmur heard.   Pulmonary:     Effort: Pulmonary effort is normal. No respiratory distress.     Breath sounds: Normal breath sounds. No wheezing.  Abdominal:     General: Bowel sounds are normal.     Palpations: Abdomen is soft.  Musculoskeletal:     Right lower leg: Edema (+1) present.     Left lower leg: Edema (+1) present.  Skin:    General: Skin is warm and dry.     Findings: Erythema (chronic to both lower ext, not warm or draining ) present.  Neurological:     General: No focal deficit present.     Mental Status: She is alert. Mental status is at baseline.     Comments: Oriented x 2   Psychiatric:        Mood and Affect: Mood normal.     Labs reviewed: Recent Labs    03/01/19 0000  NA 140  K 4.2  BUN 16  CREATININE 0.8   No results for input(s): AST, ALT, ALKPHOS, BILITOT, PROT, ALBUMIN in the last 8760 hours. Recent Labs    10/18/19 0000  WBC 6.9    NEUTROABS 3  HGB 11.6*  HCT 35*  PLT 232   Lab Results  Component Value Date  TSH 3.77 10/18/2019   Lab Results  Component Value Date   HGBA1C 6.1 03/01/2019   Lab Results  Component Value Date   CHOL 200 10/18/2019   HDL 44 10/18/2019   LDLCALC 129 10/18/2019   TRIG 137 10/18/2019    Significant Diagnostic Results in last 30 days:  No results found.  Assessment/Plan  1. Vascular dementia without behavioral disturbance ([place]) Progressive but doing quite well in skilled care. Remains ambulatory and pleasant with no acute issues. Continue Aricept 10 mg qhs   2. Obstructive sleep apnea on CPAP Continue cpap machine qhs  3. Essential hypertension Controlled, continue losartan 100 mg qd   4. Chronic diastolic heart failure (HCC) Compensated, continue 40 mg lasix daily. Followed by Dr. Radford Pax.   5. Obesity (BMI 30-39.9) Down 6 lbs, will encourage small portions and continue to monitor her weight. Given her age and dementia it will be difficult for her to adhere to a diet.   6. Acquired hypothyroidism Lab Results  Component Value Date   TSH 3.77 10/18/2019   Continue Synthroid 25 mg qd   Family/ staff Communication: discussed with the resident and her nurse  Labs/tests ordered:  NA

## 2019-11-13 ENCOUNTER — Ambulatory Visit (INDEPENDENT_AMBULATORY_CARE_PROVIDER_SITE_OTHER): Payer: Medicare Other | Admitting: *Deleted

## 2019-11-13 DIAGNOSIS — R55 Syncope and collapse: Secondary | ICD-10-CM

## 2019-11-13 LAB — CUP PACEART REMOTE DEVICE CHECK
Battery Remaining Longevity: 118 mo
Battery Remaining Percentage: 95.5 %
Battery Voltage: 2.98 V
Brady Statistic AP VP Percent: 69 %
Brady Statistic AP VS Percent: 1 %
Brady Statistic AS VP Percent: 30 %
Brady Statistic AS VS Percent: 1 %
Brady Statistic RA Percent Paced: 68 %
Brady Statistic RV Percent Paced: 99 %
Date Time Interrogation Session: 20210621235209
Implantable Lead Implant Date: 20151005
Implantable Lead Implant Date: 20151005
Implantable Lead Location: 753859
Implantable Lead Location: 753860
Implantable Pulse Generator Implant Date: 20151005
Lead Channel Impedance Value: 450 Ohm
Lead Channel Impedance Value: 580 Ohm
Lead Channel Pacing Threshold Amplitude: 0.75 V
Lead Channel Pacing Threshold Amplitude: 1.25 V
Lead Channel Pacing Threshold Pulse Width: 0.5 ms
Lead Channel Pacing Threshold Pulse Width: 0.5 ms
Lead Channel Sensing Intrinsic Amplitude: 12 mV
Lead Channel Sensing Intrinsic Amplitude: 5 mV
Lead Channel Setting Pacing Amplitude: 1 V
Lead Channel Setting Pacing Amplitude: 2.25 V
Lead Channel Setting Pacing Pulse Width: 0.5 ms
Lead Channel Setting Sensing Sensitivity: 4 mV
Pulse Gen Model: 2240
Pulse Gen Serial Number: 7666288

## 2019-11-14 NOTE — Progress Notes (Signed)
Remote pacemaker transmission.   

## 2019-12-13 ENCOUNTER — Non-Acute Institutional Stay (SKILLED_NURSING_FACILITY): Payer: Medicare Other | Admitting: Adult Health

## 2019-12-13 ENCOUNTER — Encounter: Payer: Self-pay | Admitting: Adult Health

## 2019-12-13 DIAGNOSIS — F0150 Vascular dementia without behavioral disturbance: Secondary | ICD-10-CM

## 2019-12-13 DIAGNOSIS — I872 Venous insufficiency (chronic) (peripheral): Secondary | ICD-10-CM

## 2019-12-13 DIAGNOSIS — Z9989 Dependence on other enabling machines and devices: Secondary | ICD-10-CM

## 2019-12-13 DIAGNOSIS — I2510 Atherosclerotic heart disease of native coronary artery without angina pectoris: Secondary | ICD-10-CM

## 2019-12-13 DIAGNOSIS — G4733 Obstructive sleep apnea (adult) (pediatric): Secondary | ICD-10-CM | POA: Diagnosis not present

## 2019-12-13 DIAGNOSIS — I5032 Chronic diastolic (congestive) heart failure: Secondary | ICD-10-CM

## 2019-12-13 DIAGNOSIS — Z950 Presence of cardiac pacemaker: Secondary | ICD-10-CM | POA: Diagnosis not present

## 2019-12-13 NOTE — Progress Notes (Signed)
Location:  Occupational psychologist of Service:  SNF (31) Provider:   Cindi Carbon, ANP [place] [phone]  Gayland Curry, DO  Patient Care Team: Gayland Curry, DO as PCP - General (Geriatric Medicine) Sueanne Margarita, MD as PCP - Sleep Medicine (Sleep Medicine) Evans Lance, MD as PCP - Cardiology (Cardiology) Justice Britain, MD as Consulting Physician (Orthopedic Surgery) Gaynelle Arabian, MD as Consulting Physician (Orthopedic Surgery) Garvin Fila, MD as Consulting Physician (Neurology) Evans Lance, MD as Consulting Physician (Cardiology) Jettie Booze, MD as Consulting Physician (Interventional Cardiology) Shon Hough, MD as Consulting Physician (Ophthalmology) Community, [facility] ([place])  Extended Emergency Contact Information Primary Emergency Contact: [place],JED Address: [address], [place] Phone: [phone] Mobile Phone: [phone] Relation: Son Secondary Emergency Contact: [place] Mobile Phone: [phone] Relation: Daughter  Code Status:  DNR Goals of care: Advanced Directive information Advanced Directives 10/09/2019  Does Patient Have a Medical Advance Directive? Yes  Type of Paramedic of [place];Out of facility DNR (pink MOST or yellow form)  Does patient want to make changes to medical advance directive? No - Patient declined  Copy of [place] in Chart? Yes - validated most recent copy scanned in chart (See row information)  Pre-existing out of facility DNR order (yellow form or pink MOST form) Pink MOST/Yellow Form most recent copy in chart - Physician notified to receive inpatient order     Chief Complaint  Patient presents with  . Medical Management of Chronic Issues    HPI:  Pt is a 86 y.o. female seen today for medical management of chronic diseases.     Ms. Baez reports that she is feeling well for her check up and has no acute complaints. She reports that she has some mild knee pain but nothing "unsual for her age". She remains ambulatory with a walker.  Hx of pacamaker with remote transmission documented in June with no acute concerns.   She has a hx of STEMI with angioplasty in 2008. Remains on aspirin.  No current symptoms of sob or cp.   She has chronic lower ext edema that is unchanged.  Continues to use her cpap as prescribed with no sleep issues.   Her weight has trended down by 7 lbs in the past few months which is desired.   Past Medical History:  Diagnosis Date  . Breast cancer ([place])   . Colon polyps   . Coronary artery disease 06/2006   STEMI inferior with BMS to mid RCA with mid basal inferior wall HK and luminal irrgularities elsewhere  . Fracture of femoral neck, right ([place]) 06/13/2011  . Fracture of humeral head, right, closed 06/13/2011  . GERD (gastroesophageal reflux disease)   . Hemorrhoids   . Hyperlipemia   . Hypertension   . LBBB (left bundle branch block)   . Macular degeneration   . Myocardial infarction ([place])   . Obstructive sleep apnea on CPAP   . Osteopenia   . Osteopenia   . Pacemaker 02/25/14  . Rhinitis   . Rotator cuff syndrome   . SAH (subarachnoid hemorrhage) ([place]) 02/22/14  . 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 Ulnar neuropathy   . Umbilical hernia   . Unstable gait    Past Surgical History:  Procedure Laterality Date  . BREAST LUMPECTOMY    . CATARACT EXTRACTION,  BILATERAL     Per [facility] Records  . CORONARY ANGIOPLASTY WITH STENT PLACEMENT  06/2006   Mid RCA BMS  . HIP ARTHROPLASTY  06/06/2011   Procedure: ARTHROPLASTY BIPOLAR HIP;  Surgeon: Gearlean Alf;  [facility]: [facility];  Service: Orthopedics;  Laterality: Right;  . PERMANENT PACEMAKER INSERTION N/A 02/25/2014   Procedure: PERMANENT PACEMAKER INSERTION;  Surgeon: Evans Lance, MD;  Location: [facility] CATH LAB;  Service:  Cardiovascular;  Laterality: N/A;  . SHOULDER SURGERY    . TONSILLECTOMY     Per [facility]   . WRIST SURGERY      Allergies  Allergen Reactions  . Chlorpheniramine Anaphylaxis  . Codeine Anaphylaxis  . Nystatin Rash    redness  . Amlodipine     Per [facility] Records     Outpatient Encounter Medications as of 12/13/2019  Medication Sig  . acetaminophen (TYLENOL) 500 MG tablet Take 500 mg by mouth every 8 (eight) hours as needed.   Marland Kitchen aspirin EC 81 MG tablet Take 81 mg by mouth daily.  . Cholecalciferol (VITAMIN D3) 2000 UNITS TABS Take 3,000 Units by mouth daily.   . Coenzyme Q10 (COQ10) 200 MG CAPS Take 1 tablet by mouth daily.  Marland Kitchen donepezil (ARICEPT) 10 MG tablet Take 1 tablet (10 mg total) by mouth at bedtime.  . DULoxetine (CYMBALTA) 60 MG capsule Take 60 mg by mouth daily.  . furosemide (LASIX) 40 MG tablet Take 40 mg by mouth daily. And prn edema  . levothyroxine (SYNTHROID, LEVOTHROID) 25 MCG tablet Take 25 mcg by mouth daily before breakfast.   . losartan (COZAAR) 100 MG tablet Take 100 mg by mouth daily.  . mineral oil-hydrophilic petrolatum (AQUAPHOR) ointment Apply topically as needed for dry skin.  . Multiple Vitamins-Minerals (PRESERVISION AREDS 2+MULTI VIT PO) Take by mouth.  . nebivolol (BYSTOLIC) 10 MG tablet Take 1 tablet (10 mg total) by mouth daily.  . nitroGLYCERIN (NITROSTAT) 0.4 MG SL tablet Place 0.4 mg under the tongue every 5 (five) minutes as needed for chest pain.  . potassium chloride SA (K-DUR,KLOR-CON) 20 MEQ tablet Take 20 mEq by mouth daily.   . sodium fluoride (PREVIDENT 5000 PLUS) 1.1 % CREA dental cream Place 1 application onto teeth every evening.  . vitamin B-12 (CYANOCOBALAMIN) 1000 MCG tablet Take 1,000 mcg by mouth daily.   No facility-administered encounter medications on file as of 12/13/2019.    Review of Systems  Constitutional: Negative for activity change, appetite change, chills, diaphoresis, fatigue, fever and  unexpected weight change.  HENT: Negative for congestion.   Respiratory: Negative for cough, shortness of breath and wheezing.   Cardiovascular: Positive for leg swelling. Negative for chest pain and palpitations.  Gastrointestinal: Negative for abdominal distention, abdominal pain, constipation and diarrhea.  Genitourinary: Negative for difficulty urinating and dysuria.  Musculoskeletal: Positive for arthralgias and gait problem. Negative for back pain, joint swelling and myalgias.  Neurological: Negative for dizziness, tremors, seizures, syncope, facial asymmetry, speech difficulty, weakness, light-headedness, numbness and headaches.  Psychiatric/Behavioral: Positive for confusion. Negative for agitation and behavioral problems.    Immunization History  Administered Date(s) Administered  . Influenza, High Dose Seasonal PF 02/18/2017, 03/22/2019  . Influenza,inj,Quad PF,6+ Mos 03/24/2018  . Influenza-Unspecified 03/04/2016  . Moderna SARS-COVID-2 Vaccination 06/04/2019, 07/03/2019  . Pneumococcal Conjugate-13 05/08/2014  . Pneumococcal Polysaccharide-23 10/08/2017  . Td 07/09/2002, 01/16/2013  . Zoster 06/30/2010  . Zoster Recombinat (Shingrix) 11/18/2017, 01/17/2018   Pertinent  Health Maintenance Due  Topic Date  Due  . INFLUENZA VACCINE  12/23/2019  . DEXA SCAN  Completed  . PNA vac Low Risk Adult  Completed   Fall Risk  07/04/2019 12/21/2018 10/18/2018 06/14/2018 02/08/2018  Falls in the past year? 0 0 0 0 No  Number falls in past yr: 0 0 0 0 -  Injury with Fall? 0 0 0 0 -  Risk Factor Category  - - - - -  Risk for fall due to : - Impaired mobility - - -  Follow up - Falls evaluation completed;Falls prevention discussed - - -   Functional Status Survey:    Vitals:   12/13/19 1557  Weight: (!) 234 lb 9.6 oz (106.4 kg)   Body mass index is 37.87 kg/m.  Wt Readings from Last 3 Encounters:  12/13/19 (!) 234 lb 9.6 oz (106.4 kg)  11/01/19 236 lb 12.8 oz (107.4 kg)  10/09/19  241 lb 9.6 oz (109.6 kg)    Physical Exam Vitals and nursing note reviewed.  Constitutional:      General: She is not in acute distress.    Appearance: She is not diaphoretic.     Comments: obese  HENT:     Head: Normocephalic and atraumatic.     Mouth/Throat:     Mouth: Mucous membranes are moist.     Pharynx: Oropharynx is clear.  Neck:     Vascular: No JVD.  Cardiovascular:     Rate and Rhythm: Normal rate and regular rhythm.     Heart sounds: No murmur heard.   Pulmonary:     Effort: Pulmonary effort is normal. No respiratory distress.     Breath sounds: Normal breath sounds. No wheezing.  Abdominal:     General: Bowel sounds are normal.     Palpations: Abdomen is soft.  Musculoskeletal:     Right lower leg: Edema (+1) present.     Left lower leg: Edema (+1) present.  Skin:    General: Skin is warm and dry.     Findings: No erythema ( ).  Neurological:     General: No focal deficit present.     Mental Status: She is alert. Mental status is at baseline.     Comments: Oriented x 2   Psychiatric:        Mood and Affect: Mood normal.     Labs reviewed: Recent Labs    03/01/19 0000  NA 140  K 4.2  BUN 16  CREATININE 0.8   No results for input(s): AST, ALT, ALKPHOS, BILITOT, PROT, ALBUMIN in the last 8760 hours. Recent Labs    10/18/19 0000  WBC 6.9  NEUTROABS 3  HGB 11.6*  HCT 35*  PLT 232   Lab Results  Component Value Date   TSH 3.77 10/18/2019   Lab Results  Component Value Date   HGBA1C 6.1 03/01/2019   Lab Results  Component Value Date   CHOL 200 10/18/2019   HDL 44 10/18/2019   LDLCALC 129 10/18/2019   TRIG 137 10/18/2019    Significant Diagnostic Results in last 30 days:  No results found.  Assessment/Plan  1. Vascular dementia without behavioral disturbance ([place]) MMSE 28/30 on 10/11/19 but short term memory loss is quite notable in the visit. Remains ambulatory and able to help with her ADLs and therefore likely benefits from  Aricept, will continue   2. Atherosclerosis of native coronary artery of native heart without angina pectoris Continue aspirin 81 mg qd BP controlled    3. Chronic diastolic  heart failure (HCC) Controlled Continue Lasix 40 mg qd, along with ARB therapy and Bystolic 10 mg daily  4. Pacemaker Remote transmission reviewed in epic  5. Obstructive sleep apnea on CPAP No current issues. Continue cpap qhs.  6. Venous insufficiency Continue Lasix 40 mg qd, compression hose, and elevation of legs.     Family/ staff Communication: discussed with the resident and her nurse  Labs/tests ordered:  NA

## 2019-12-27 ENCOUNTER — Non-Acute Institutional Stay (SKILLED_NURSING_FACILITY): Payer: Medicare Other | Admitting: Adult Health

## 2019-12-27 DIAGNOSIS — Z853 Personal history of malignant neoplasm of breast: Secondary | ICD-10-CM

## 2019-12-27 DIAGNOSIS — N63 Unspecified lump in unspecified breast: Secondary | ICD-10-CM | POA: Diagnosis not present

## 2019-12-27 NOTE — Progress Notes (Signed)
Location:  Occupational psychologist of Service:  SNF (31) Provider:   Cindi Carbon, ANP [place] [phone]  Gayland Curry, DO  Patient Care Team: Gayland Curry, DO as PCP - General (Geriatric Medicine) Sueanne Margarita, MD as PCP - Sleep Medicine (Sleep Medicine) Evans Lance, MD as PCP - Cardiology (Cardiology) Justice Britain, MD as Consulting Physician (Orthopedic Surgery) Gaynelle Arabian, MD as Consulting Physician (Orthopedic Surgery) Garvin Fila, MD as Consulting Physician (Neurology) Evans Lance, MD as Consulting Physician (Cardiology) Jettie Booze, MD as Consulting Physician (Interventional Cardiology) Shon Hough, MD as Consulting Physician (Ophthalmology) Community, [facility] ([place])  Extended Emergency Contact Information Primary Emergency Contact: Mcparland,JED Address: [place], [place] Phone: [phone] Mobile Phone: [phone] Relation: Son Secondary Emergency Contact: [place] Mobile Phone: [phone] Relation: Daughter  Code Status:  DNR Goals of care: Advanced Directive information Advanced Directives 10/09/2019  Does Patient Have a Medical Advance Directive? Yes  Type of Paramedic of [place];Out of facility DNR (pink MOST or yellow form)  Does patient want to make changes to medical advance directive? No - Patient declined  Copy of [place] in Chart? Yes - validated most recent copy scanned in chart (See row information)  Pre-existing out of facility DNR order (yellow form or pink MOST form) Pink MOST/Yellow Form most recent copy in chart - Physician notified to receive inpatient order     Chief Complaint  Patient presents with  . Acute Visit    left breast lump    HPI:  Pt is a 86 y.o. female seen today for an acute visit for breast lump. This finding  was noted by her CNA when helping her get dressed. This is new per the resident. However, she has memory loss and does not remember that she has a hx of breast cancer with lumpectomy on the left. The area is not painful and there is no redness, tenderness, nipple discharge or other symptoms. We reviewed her last mammogram which was in 2018 indicating bilateral breast calcifications and a 5 mm cyst in the right breast.  Due to increased care needs she moved to skilled care earlier this year and no longer has mammograms (due to her age/memory loss).   Past Medical History:  Diagnosis Date  . Breast cancer ([place])   . Colon polyps   . Coronary artery disease 06/2006   STEMI inferior with BMS to mid RCA with mid basal inferior wall HK and luminal irrgularities elsewhere  . Fracture of femoral neck, right ([place]) 06/13/2011  . Fracture of humeral head, right, closed 06/13/2011  . GERD (gastroesophageal reflux disease)   . Hemorrhoids   . Hyperlipemia   . Hypertension   . LBBB (left bundle branch block)   . Macular degeneration   . Myocardial infarction ([place])   . Obstructive sleep apnea on CPAP   . Osteopenia   . Osteopenia   . Pacemaker 02/25/14  . Rhinitis   . Rotator cuff syndrome   . SAH (subarachnoid hemorrhage) ([place]) 02/22/14  . 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 Ulnar neuropathy   . Umbilical hernia   . Unstable gait    Past Surgical History:  Procedure Laterality Date  . BREAST LUMPECTOMY    . CATARACT EXTRACTION, BILATERAL     Per [facility]  . CORONARY ANGIOPLASTY  WITH STENT PLACEMENT  06/2006   Mid RCA BMS  . HIP ARTHROPLASTY  06/06/2011   Procedure: ARTHROPLASTY BIPOLAR HIP;  Surgeon: Gearlean Alf;  Location: [facility]S;  Service: Orthopedics;  Laterality: Right;  . PERMANENT PACEMAKER INSERTION N/A 02/25/2014   Procedure: PERMANENT PACEMAKER INSERTION;  Surgeon: Evans Lance, MD;  Location: [facility] CATH LAB;  Service: Cardiovascular;  Laterality: N/A;  . SHOULDER SURGERY      . TONSILLECTOMY     Per [facility]   . WRIST SURGERY      Allergies  Allergen Reactions  . Chlorpheniramine Anaphylaxis  . Codeine Anaphylaxis  . Nystatin Rash    redness  . Amlodipine     Per [facility]     Outpatient Encounter Medications as of 12/27/2019  Medication Sig  . acetaminophen (TYLENOL) 500 MG tablet Take 500 mg by mouth every 8 (eight) hours as needed.   Marland Kitchen aspirin EC 81 MG tablet Take 81 mg by mouth daily.  . Cholecalciferol (VITAMIN D3) 2000 UNITS TABS Take 3,000 Units by mouth daily.   . Coenzyme Q10 (COQ10) 200 MG CAPS Take 1 tablet by mouth daily.  Marland Kitchen donepezil (ARICEPT) 10 MG tablet Take 1 tablet (10 mg total) by mouth at bedtime.  . DULoxetine (CYMBALTA) 60 MG capsule Take 60 mg by mouth daily.  . furosemide (LASIX) 40 MG tablet Take 40 mg by mouth daily. And prn edema  . levothyroxine (SYNTHROID, LEVOTHROID) 25 MCG tablet Take 25 mcg by mouth daily before breakfast.   . losartan (COZAAR) 100 MG tablet Take 100 mg by mouth daily.  . mineral oil-hydrophilic petrolatum (AQUAPHOR) ointment Apply topically as needed for dry skin.  . Multiple Vitamins-Minerals (PRESERVISION AREDS 2+MULTI VIT PO) Take by mouth.  . nebivolol (BYSTOLIC) 10 MG tablet Take 1 tablet (10 mg total) by mouth daily.  . nitroGLYCERIN (NITROSTAT) 0.4 MG SL tablet Place 0.4 mg under the tongue every 5 (five) minutes as needed for chest pain.  . potassium chloride SA (K-DUR,KLOR-CON) 20 MEQ tablet Take 20 mEq by mouth daily.   . sodium fluoride (PREVIDENT 5000 PLUS) 1.1 % CREA dental cream Place 1 application onto teeth every evening.  . vitamin B-12 (CYANOCOBALAMIN) 1000 MCG tablet Take 1,000 mcg by mouth daily.   No facility-administered encounter medications on file as of 12/27/2019.    Review of Systems  Constitutional: Negative for activity change, appetite change, chills, diaphoresis, fatigue and fever.  Respiratory: Negative for cough, shortness of breath,  wheezing and stridor.   Skin: Negative for color change, pallor, rash and wound.       Left outer quad breast lump     Immunization History  Administered Date(s) Administered  . Influenza, High Dose Seasonal PF 02/18/2017, 03/22/2019  . Influenza,inj,Quad PF,6+ Mos 03/24/2018  . Influenza-Unspecified 03/04/2016  . Moderna SARS-COVID-2 Vaccination 06/04/2019, 07/03/2019  . Pneumococcal Conjugate-13 05/08/2014  . Pneumococcal Polysaccharide-23 10/08/2017  . Td 07/09/2002, 01/16/2013  . Zoster 06/30/2010  . Zoster Recombinat (Shingrix) 11/18/2017, 01/17/2018   Pertinent  Health Maintenance Due  Topic Date Due  . INFLUENZA VACCINE  12/23/2019  . DEXA SCAN  Completed  . PNA vac Low Risk Adult  Completed   Fall Risk  07/04/2019 12/21/2018 10/18/2018 06/14/2018 02/08/2018  Falls in the past year? 0 0 0 0 No  Number falls in past yr: 0 0 0 0 -  Injury with Fall? 0 0 0 0 -  Risk Factor Category  - - - - -  Risk for fall due to : - Impaired mobility - - -  Follow up - Falls evaluation completed;Falls prevention discussed - - -   Functional Status Survey:    There were no vitals filed for this visit. There is no height or weight on file to calculate BMI. Physical Exam Constitutional:      Appearance: Normal appearance.  Chest:     Breasts:        Right: Normal.        Left: Mass present. No swelling, bleeding, inverted nipple, nipple discharge, skin change or tenderness.    Lymphadenopathy:     Upper Body:     Right upper body: No supraclavicular or axillary adenopathy.     Left upper body: No supraclavicular or axillary adenopathy.  Neurological:     Mental Status: She is alert.     Labs reviewed: Recent Labs    03/01/19 0000  NA 140  K 4.2  BUN 16  CREATININE 0.8   No results for input(s): AST, ALT, ALKPHOS, BILITOT, PROT, ALBUMIN in the last 8760 hours. Recent Labs    10/18/19 0000  WBC 6.9  NEUTROABS 3  HGB 11.6*  HCT 35*  PLT 232   Lab Results  Component  Value Date   TSH 3.77 10/18/2019   Lab Results  Component Value Date   HGBA1C 6.1 03/01/2019   Lab Results  Component Value Date   CHOL 200 10/18/2019   HDL 44 10/18/2019   LDLCALC 129 10/18/2019   TRIG 137 10/18/2019    Significant Diagnostic Results in last 30 days:  No results found.  Assessment/Plan 1. Breast lump Noted firm fixed area in the left outer quadrant. Pt has a hx of lumpectomy to this area and so this may represent scar tissue. The resident and staff feel that it is new and given her history of breast cancer she would like this looked into. Will order diagnostic mammogram and ultrasound.   2. Personal history of breast cancer Noted    Family/ staff Communication: discussed with Ms. Lambe and called and left a message with her son regarding this issue.   Labs/tests ordered:  Diagnostic mammogram and ultrasound

## 2019-12-28 ENCOUNTER — Encounter: Payer: Self-pay | Admitting: Adult Health

## 2020-01-07 DIAGNOSIS — Z20828 Contact with and (suspected) exposure to other viral communicable diseases: Secondary | ICD-10-CM | POA: Diagnosis not present

## 2020-01-07 DIAGNOSIS — Z9189 Other specified personal risk factors, not elsewhere classified: Secondary | ICD-10-CM | POA: Diagnosis not present

## 2020-01-14 DIAGNOSIS — Z20828 Contact with and (suspected) exposure to other viral communicable diseases: Secondary | ICD-10-CM | POA: Diagnosis not present

## 2020-01-14 DIAGNOSIS — Z9189 Other specified personal risk factors, not elsewhere classified: Secondary | ICD-10-CM | POA: Diagnosis not present

## 2020-01-17 ENCOUNTER — Non-Acute Institutional Stay (SKILLED_NURSING_FACILITY): Payer: Medicare Other | Admitting: Adult Health

## 2020-01-17 DIAGNOSIS — E669 Obesity, unspecified: Secondary | ICD-10-CM | POA: Diagnosis not present

## 2020-01-17 DIAGNOSIS — I872 Venous insufficiency (chronic) (peripheral): Secondary | ICD-10-CM | POA: Diagnosis not present

## 2020-01-17 DIAGNOSIS — G4733 Obstructive sleep apnea (adult) (pediatric): Secondary | ICD-10-CM | POA: Diagnosis not present

## 2020-01-17 DIAGNOSIS — F3341 Major depressive disorder, recurrent, in partial remission: Secondary | ICD-10-CM | POA: Diagnosis not present

## 2020-01-17 DIAGNOSIS — Z9989 Dependence on other enabling machines and devices: Secondary | ICD-10-CM | POA: Diagnosis not present

## 2020-01-17 DIAGNOSIS — F0150 Vascular dementia without behavioral disturbance: Secondary | ICD-10-CM

## 2020-01-17 DIAGNOSIS — E039 Hypothyroidism, unspecified: Secondary | ICD-10-CM

## 2020-01-18 ENCOUNTER — Encounter: Payer: Self-pay | Admitting: Adult Health

## 2020-01-18 NOTE — Progress Notes (Signed)
Location:  Occupational psychologist of Service:  SNF (31) Provider:   Cindi Carbon, [place] [phone]  Royal Hawthorn, NP  Patient Care Team: Royal Hawthorn, NP as PCP - General (Nurse Practitioner) Sueanne Margarita, MD as PCP - Sleep Medicine (Sleep Medicine) Evans Lance, MD as PCP - Cardiology (Cardiology) Justice Britain, MD as Consulting Physician (Orthopedic Surgery) Gaynelle Arabian, MD as Consulting Physician (Orthopedic Surgery) Garvin Fila, MD as Consulting Physician (Neurology) Evans Lance, MD as Consulting Physician (Cardiology) Jettie Booze, MD as Consulting Physician (Interventional Cardiology) Shon Hough, MD as Consulting Physician (Ophthalmology) Community, [facility] ([place])  Extended Emergency Contact Information Primary Emergency Contact: [place],JED Address: [place], [place] Phone: [phone] Mobile Phone: [phone] Relation: Son Secondary Emergency Contact: [place] Mobile Phone: [phone] Relation: Daughter  Code Status:  DNR Goals of care: Advanced Directive information Advanced Directives 10/09/2019  Does Patient Have a Medical Advance Directive? Yes  Type of Paramedic of [place];Out of facility DNR (pink MOST or yellow form)  Does patient want to make changes to medical advance directive? No - Patient declined  Copy of [place] in Chart? Yes - validated most recent copy scanned in chart (See row information)  Pre-existing out of facility DNR order (yellow form or pink MOST form) Pink MOST/Yellow Form most recent copy in chart - Physician notified to receive inpatient order     Chief Complaint  Patient presents with  . Medical Management of Chronic Issues    HPI:  Pt is a 86 y.o. female seen today for medical management of chronic diseases.     Vascular dementia: no issues with wandering or behavior. Remains oriented and ambulatory with a walker.   Continues to wear her cpap at night. No issues with sleep or lethargy reported  Hypothyroidism:weight trending downward with no other symptoms of hot/cold intolerance, etc Lab Results  Component Value Date   TSH 3.77 10/18/2019   There was a noted breast lump earlier this month on the left.  Her family was notified with recommendation for a mammogram if they were interested but they declined at this time.   No reports of crying, decreased appetite, anxiety, lack of sleep etc.   No venous ulcers. Has chronic edema with mild erythema to both legs unchanged.   Past Medical History:  Diagnosis Date  . Breast cancer ([place])   . Colon polyps   . Coronary artery disease 06/2006   STEMI inferior with BMS to mid RCA with mid basal inferior wall HK and luminal irrgularities elsewhere  . Fracture of femoral neck, right ([place]) 06/13/2011  . Fracture of humeral head, right, closed 06/13/2011  . GERD (gastroesophageal reflux disease)   . Hemorrhoids   . Hyperlipemia   . Hypertension   . LBBB (left bundle branch block)   . Macular degeneration   . Myocardial infarction ([place])   . Obstructive sleep apnea on CPAP   . Osteopenia   . Osteopenia   . Pacemaker 02/25/14  . Rhinitis   . Rotator cuff syndrome   . SAH (subarachnoid hemorrhage) ([place]) 02/22/14  . 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 Ulnar neuropathy   . Umbilical hernia   . Unstable gait    Past Surgical History:  Procedure Laterality Date  . BREAST LUMPECTOMY    . CATARACT EXTRACTION, BILATERAL  Per [facility]  . CORONARY ANGIOPLASTY WITH STENT PLACEMENT  06/2006   Mid RCA BMS  . HIP ARTHROPLASTY  06/06/2011   Procedure: ARTHROPLASTY BIPOLAR HIP;  Surgeon: Gearlean Alf;  [facility];  Service: Orthopedics;  Laterality: Right;  . PERMANENT PACEMAKER INSERTION N/A 02/25/2014   Procedure: PERMANENT PACEMAKER  INSERTION;  Surgeon: Evans Lance, MD;  Location[facility] C[facility] LAB;  Service: Cardiovascular;  Laterality: N/A;  . SHOULDER SURGERY    . TONSILLECTOMY     Per [facility]   . WRIST SURGERY      Allergies  Allergen Reactions  . Chlorpheniramine Anaphylaxis  . Codeine Anaphylaxis  . Nystatin Rash    redness  . Amlodipine     Per [facility]     Outpatient Encounter Medications as of 01/17/2020  Medication Sig  . acetaminophen (TYLENOL) 500 MG tablet Take 500 mg by mouth every 8 (eight) hours as needed.   Marland Kitchen aspirin EC 81 MG tablet Take 81 mg by mouth daily.  . Cholecalciferol (VITAMIN D3) 2000 UNITS TABS Take 3,000 Units by mouth daily.   . Coenzyme Q10 (COQ10) 200 MG CAPS Take 1 tablet by mouth in the morning, at noon, and at bedtime.   . donepezil (ARICEPT) 10 MG tablet Take 1 tablet (10 mg total) by mouth at bedtime.  . DULoxetine (CYMBALTA) 60 MG capsule Take 60 mg by mouth daily.  . furosemide (LASIX) 40 MG tablet Take 40 mg by mouth daily. And prn edema  . levothyroxine (SYNTHROID, LEVOTHROID) 25 MCG tablet Take 25 mcg by mouth daily before breakfast.   . losartan (COZAAR) 100 MG tablet Take 100 mg by mouth daily.  . mineral oil-hydrophilic petrolatum (AQUAPHOR) ointment Apply topically as needed for dry skin.  . Multiple Vitamins-Minerals (PRESERVISION AREDS 2+MULTI VIT PO) Take by mouth.  . nebivolol (BYSTOLIC) 10 MG tablet Take 1 tablet (10 mg total) by mouth daily.  . nitroGLYCERIN (NITROSTAT) 0.4 MG SL tablet Place 0.4 mg under the tongue every 5 (five) minutes as needed for chest pain.  . potassium chloride SA (K-DUR,KLOR-CON) 20 MEQ tablet Take 20 mEq by mouth daily.   . sodium fluoride (PREVIDENT 5000 PLUS) 1.1 % CREA dental cream Place 1 application onto teeth every evening.  . vitamin B-12 (CYANOCOBALAMIN) 1000 MCG tablet Take 1,000 mcg by mouth daily.   No facility-administered encounter medications on file as of 01/17/2020.    Review of  Systems  Constitutional: Negative for activity change, appetite change, chills, diaphoresis, fatigue, fever and unexpected weight change.  HENT: Negative for congestion.   Respiratory: Negative for cough, shortness of breath and wheezing.   Cardiovascular: Positive for leg swelling. Negative for chest pain and palpitations.  Gastrointestinal: Negative for abdominal distention, abdominal pain, constipation and diarrhea.  Genitourinary: Negative for difficulty urinating and dysuria.  Musculoskeletal: Positive for arthralgias and gait problem. Negative for back pain, joint swelling and myalgias.  Skin:       Chronic mild erythema to legs  Neurological: Negative for dizziness, tremors, seizures, syncope, facial asymmetry, speech difficulty, weakness, light-headedness, numbness and headaches.  Psychiatric/Behavioral: Positive for confusion. Negative for agitation and behavioral problems.    Immunization History  Administered Date(s) Administered  . Influenza, High Dose Seasonal PF 02/18/2017, 03/22/2019  . Influenza,inj,Quad PF,6+ Mos 03/24/2018  . Influenza-Unspecified 03/04/2016  . Moderna SARS-COVID-2 Vaccination 06/04/2019, 07/03/2019  . Pneumococcal Conjugate-13 05/08/2014  . Pneumococcal Polysaccharide-23 10/08/2017  . Td 07/09/2002, 01/16/2013  . Zoster 06/30/2010  .  Zoster Recombinat (Shingrix) 11/18/2017, 01/17/2018   Pertinent  Health Maintenance Due  Topic Date Due  . INFLUENZA VACCINE  12/23/2019  . DEXA SCAN  Completed  . PNA vac Low Risk Adult  Completed   Fall Risk  07/04/2019 12/21/2018 10/18/2018 06/14/2018 02/08/2018  Falls in the past year? 0 0 0 0 No  Number falls in past yr: 0 0 0 0 -  Injury with Fall? 0 0 0 0 -  Risk Factor Category  - - - - -  Risk for fall due to : - Impaired mobility - - -  Follow up - Falls evaluation completed;Falls prevention discussed - - -   Functional Status Survey:    Vitals:   01/18/20 1027  Weight: 232 lb (105.2 kg)   Body mass  index is 37.45 kg/m.  Wt Readings from Last 3 Encounters:  01/18/20 232 lb (105.2 kg)  12/13/19 (!) 234 lb 9.6 oz (106.4 kg)  11/01/19 236 lb 12.8 oz (107.4 kg)    Physical Exam Vitals and nursing note reviewed.  Constitutional:      General: She is not in acute distress.    Appearance: She is not diaphoretic.     Comments: obese  HENT:     Head: Normocephalic and atraumatic.     Mouth/Throat:     Mouth: Mucous membranes are moist.     Pharynx: Oropharynx is clear.  Neck:     Vascular: No JVD.  Cardiovascular:     Rate and Rhythm: Normal rate and regular rhythm.     Heart sounds: No murmur heard.   Pulmonary:     Effort: Pulmonary effort is normal. No respiratory distress.     Breath sounds: Normal breath sounds. No wheezing.  Abdominal:     General: Bowel sounds are normal.     Palpations: Abdomen is soft.  Musculoskeletal:     Right lower leg: Edema (+1) present.     Left lower leg: Edema (+1) present.  Skin:    General: Skin is warm and dry.     Findings: No erythema ( ).  Neurological:     General: No focal deficit present.     Mental Status: She is alert. Mental status is at baseline.     Comments: Oriented x 2   Psychiatric:        Mood and Affect: Mood normal.     Labs reviewed: Recent Labs    03/01/19 0000 10/18/19 0000  NA 140 139  K 4.2 3.9  CL  --  102  CO2  --  24*  BUN 16 13  CREATININE 0.8 0.8  CALCIUM  --  9.2   Recent Labs    10/18/19 0000  AST 17  ALT 9  ALKPHOS 103  ALBUMIN 3.7   Recent Labs    10/18/19 0000  WBC 6.9  NEUTROABS 3  HGB 11.6*  HCT 35*  PLT 232   Lab Results  Component Value Date   TSH 3.77 10/18/2019   Lab Results  Component Value Date   HGBA1C 6.1 03/01/2019   Lab Results  Component Value Date   CHOL 200 10/18/2019   HDL 44 10/18/2019   LDLCALC 129 10/18/2019   TRIG 137 10/18/2019    Significant Diagnostic Results in last 30 days:  No results found.  Assessment/Plan  1. Vascular dementia  without behavioral disturbance ([place]) Doing well in the skilled care unit. Continues to ambulate with a walker and therefore likely benefits from Aricept  10 mg qhs, will continue   2. Obstructive sleep apnea on CPAP Continue cpap qhs  3. Venous insufficiency Edema is managed well with compression hose and Lasix 40 mg qd   4. Acquired hypothyroidism Lab Results  Component Value Date   TSH 3.77 10/18/2019   Continue Synthroid 25 mcg qd  5. Depression, major, recurrent, in partial remission (HCC) Mood is stable, would continue Cymbalta and not taper due to risk of destabilization   6. Obesity (BMI 30-39.9) Trending down at this time Encourage physical activity when able.    Family/ staff Communication: discussed with the resident and her nurse  Labs/tests ordered:  NA

## 2020-02-12 ENCOUNTER — Ambulatory Visit (INDEPENDENT_AMBULATORY_CARE_PROVIDER_SITE_OTHER): Payer: Medicare Other | Admitting: *Deleted

## 2020-02-12 DIAGNOSIS — I4430 Unspecified atrioventricular block: Secondary | ICD-10-CM

## 2020-02-12 LAB — CUP PACEART REMOTE DEVICE CHECK
Battery Remaining Longevity: 112 mo
Battery Remaining Percentage: 95.5 %
Battery Voltage: 2.96 V
Brady Statistic AP VP Percent: 71 %
Brady Statistic AP VS Percent: 1 %
Brady Statistic AS VP Percent: 28 %
Brady Statistic AS VS Percent: 1 %
Brady Statistic RA Percent Paced: 69 %
Brady Statistic RV Percent Paced: 98 %
Date Time Interrogation Session: 20210921063538
Implantable Lead Implant Date: 20151005
Implantable Lead Implant Date: 20151005
Implantable Lead Location: 753859
Implantable Lead Location: 753860
Implantable Pulse Generator Implant Date: 20151005
Lead Channel Impedance Value: 450 Ohm
Lead Channel Impedance Value: 590 Ohm
Lead Channel Pacing Threshold Amplitude: 0.75 V
Lead Channel Pacing Threshold Amplitude: 1.125 V
Lead Channel Pacing Threshold Pulse Width: 0.5 ms
Lead Channel Pacing Threshold Pulse Width: 0.5 ms
Lead Channel Sensing Intrinsic Amplitude: 12 mV
Lead Channel Sensing Intrinsic Amplitude: 4.7 mV
Lead Channel Setting Pacing Amplitude: 1 V
Lead Channel Setting Pacing Amplitude: 2.125
Lead Channel Setting Pacing Pulse Width: 0.5 ms
Lead Channel Setting Sensing Sensitivity: 4 mV
Pulse Gen Model: 2240
Pulse Gen Serial Number: 7666288

## 2020-02-15 NOTE — Progress Notes (Signed)
Remote pacemaker transmission.   

## 2020-02-21 ENCOUNTER — Encounter: Payer: Self-pay | Admitting: Adult Health

## 2020-02-21 ENCOUNTER — Telehealth: Payer: Self-pay | Admitting: Internal Medicine

## 2020-02-21 ENCOUNTER — Non-Acute Institutional Stay (SKILLED_NURSING_FACILITY): Payer: Medicare Other | Admitting: Adult Health

## 2020-02-21 DIAGNOSIS — F0150 Vascular dementia without behavioral disturbance: Secondary | ICD-10-CM | POA: Diagnosis not present

## 2020-02-21 DIAGNOSIS — F3341 Major depressive disorder, recurrent, in partial remission: Secondary | ICD-10-CM

## 2020-02-21 NOTE — Telephone Encounter (Signed)
Pt son called in and stated pt is in a skilled Home and really hard to transport pt to office.  They would like to know if the appt with Lovena Le on Monday 10/1 is necessary to come in the office.  Pt is at [facility] and have medical assistance on site.  They only want to come in office for appts if it is medically necessary going forward     Best number [phone]- Jeb Dolinski

## 2020-02-21 NOTE — Telephone Encounter (Signed)
Spoke with the patient's son who is wondering whether it is medically necessary for the patient's appointment tomorrow with Dr. Lovena Le to be in person. I advised him that it was a physical pacer check which indicates an in office appointment. He states that the patient is now in skilled nursing facility and is seen daily by medical professionals. He states that they will be able to bring the patient in if it is medically necessary. If it is not medically necessary then he would like for the appointment to be remote.

## 2020-02-21 NOTE — Telephone Encounter (Signed)
That would be up to Dr. Lovena Le if he thinks this visit is ok virtual.  If Dr. Lovena Le thinks it is, we are able to schedule a remote transmission (allow up to 24 hours for the transmission to complete). So if virtual, the apt. Would need to be moved until sometime next week due to scheduling a remote the day before the apt.

## 2020-02-21 NOTE — Progress Notes (Signed)
Location:  Occupational psychologist of Service:  SNF (31) Provider:  Cindi Carbon, [place] [phone]   Royal Hawthorn, NP  Patient Care Team: Royal Hawthorn, NP as PCP - General (Nurse Practitioner) Sueanne Margarita, MD as PCP - Sleep Medicine (Sleep Medicine) Evans Lance, MD as PCP - Cardiology (Cardiology) Justice Britain, MD as Consulting Physician (Orthopedic Surgery) Gaynelle Arabian, MD as Consulting Physician (Orthopedic Surgery) Garvin Fila, MD as Consulting Physician (Neurology) Evans Lance, MD as Consulting Physician (Cardiology) Jettie Booze, MD as Consulting Physician (Interventional Cardiology) Shon Hough, MD as Consulting Physician (Ophthalmology) Community, [facility] ([place])  Extended Emergency Contact Information Primary Emergency Contact: [place],JED Address: [place], [place] Phone: [phone] Mobile Phone: [phone] Relation: Son Secondary Emergency Contact: [place] Mobile Phone: [phone] Relation: Daughter  Code Status:  DNR Goals of care: Advanced Directive information Advanced Directives 10/09/2019  Does Patient Have a Medical Advance Directive? Yes  Type of Paramedic of [place];Out of facility DNR (pink MOST or yellow form)  Does patient want to make changes to medical advance directive? No - Patient declined  Copy of [place] in Chart? Yes - validated most recent copy scanned in chart (See row information)  Pre-existing out of facility DNR order (yellow form or pink MOST form) Pink MOST/Yellow Form most recent copy in chart - Physician notified to receive inpatient order     Chief Complaint  Patient presents with   Medical Management of Chronic Issues    HPI:  Pt is a 86 y.o. female seen today for medical management of chronic diseases.    [name] reports today that she is feeling "constricted" and "isolated".  She misses her house, her friends, and freedom to go out in her care when she wants to. She would like to participate in more activities and eat with friends that can talk with her. She notes that most of the residents on the skilled hall are not able to communicate with her. She feels depressed or sad at times of the loss of independence. She is not having any issues sleeping or issues with her appetite. She has very short term memory loss and at times forgets her location and what led to her stay in skilled care.   Past Medical History:  Diagnosis Date   Breast cancer ([place])    Colon polyps    Coronary artery disease 06/2006   STEMI inferior with BMS to mid RCA with mid basal inferior wall HK and luminal irrgularities elsewhere   Fracture of femoral neck, right ([place]) 06/13/2011   Fracture of humeral head, right, closed 06/13/2011   GERD (gastroesophageal reflux disease)    Hemorrhoids    Hyperlipemia    Hypertension    LBBB (left bundle branch block)    Macular degeneration    Myocardial infarction ([place])    Obstructive sleep apnea on CPAP    Osteopenia    Osteopenia    Pacemaker 02/25/14   Rhinitis    Rotator cuff syndrome    SAH (subarachnoid hemorrhage) ([place]) 02/22/14   Stokes Adams attack 02/22/14   Ulnar neuropathy    Umbilical hernia    Unstable gait    Past Surgical History:  Procedure Laterality Date   BREAST LUMPECTOMY     CATARACT EXTRACTION, BILATERAL     Per [facility]   CORONARY ANGIOPLASTY WITH STENT PLACEMENT  06/2006   Mid RCA BMS   HIP ARTHROPLASTY  06/06/2011   Procedure: ARTHROPLASTY BIPOLAR HIP;  Surgeon: Gearlean Alf;  Location: [facility];  Service: Orthopedics;  Laterality: Right;   PERMANENT PACEMAKER INSERTION N/A 02/25/2014   Procedure: PERMANENT PACEMAKER INSERTION;  Surgeon: Evans Lance, MD;  Location: [facility] CATH LAB;  Service: Cardiovascular;   Laterality: N/A;   SHOULDER SURGERY     TONSILLECTOMY     Per [facility]    WRIST SURGERY      Allergies  Allergen Reactions   Chlorpheniramine Anaphylaxis   Codeine Anaphylaxis   Nystatin Rash    redness   Amlodipine     Per [facility] Records     Outpatient Encounter Medications as of 02/21/2020  Medication Sig   acetaminophen (TYLENOL) 500 MG tablet Take 500 mg by mouth every 8 (eight) hours as needed.    aspirin EC 81 MG tablet Take 81 mg by mouth daily.   Cholecalciferol (VITAMIN D3) 2000 UNITS TABS Take 3,000 Units by mouth daily.    Coenzyme Q10 (COQ10) 200 MG CAPS Take 1 tablet by mouth in the morning, at noon, and at bedtime.    donepezil (ARICEPT) 10 MG tablet Take 1 tablet (10 mg total) by mouth at bedtime.   DULoxetine (CYMBALTA) 60 MG capsule Take 60 mg by mouth daily.   furosemide (LASIX) 40 MG tablet Take 40 mg by mouth daily. And prn edema   levothyroxine (SYNTHROID, LEVOTHROID) 25 MCG tablet Take 25 mcg by mouth daily before breakfast.    losartan (COZAAR) 100 MG tablet Take 100 mg by mouth daily.   mineral oil-hydrophilic petrolatum (AQUAPHOR) ointment Apply topically as needed for dry skin.   Multiple Vitamins-Minerals (PRESERVISION AREDS 2+MULTI VIT PO) Take by mouth.   nebivolol (BYSTOLIC) 10 MG tablet Take 1 tablet (10 mg total) by mouth daily.   nitroGLYCERIN (NITROSTAT) 0.4 MG SL tablet Place 0.4 mg under the tongue every 5 (five) minutes as needed for chest pain.   potassium chloride SA (K-DUR,KLOR-CON) 20 MEQ tablet Take 20 mEq by mouth daily.    sodium fluoride (PREVIDENT 5000 PLUS) 1.1 % CREA dental cream Place 1 application onto teeth every evening.   vitamin B-12 (CYANOCOBALAMIN) 1000 MCG tablet Take 1,000 mcg by mouth daily.   No facility-administered encounter medications on file as of 02/21/2020.    Review of Systems  Constitutional: Negative for activity change, appetite change, chills, diaphoresis,  fatigue, fever and unexpected weight change.  Musculoskeletal: Positive for arthralgias (knees hurt) and gait problem. Negative for back pain.  Psychiatric/Behavioral: Positive for confusion and dysphoric mood. Negative for agitation, behavioral problems, hallucinations, self-injury, sleep disturbance and suicidal ideas. The patient is not nervous/anxious and is not hyperactive.     Immunization History  Administered Date(s) Administered   Influenza, High Dose Seasonal PF 02/18/2017, 03/22/2019   Influenza,inj,Quad PF,6+ Mos 03/24/2018   Influenza-Unspecified 03/04/2016   Moderna SARS-COVID-2 Vaccination 06/04/2019, 07/03/2019   Pneumococcal Conjugate-13 05/08/2014   Pneumococcal Polysaccharide-23 10/08/2017   Td 07/09/2002, 01/16/2013   Zoster 06/30/2010   Zoster Recombinat (Shingrix) 11/18/2017, 01/17/2018   Pertinent  Health Maintenance Due  Topic Date Due   INFLUENZA VACCINE  12/23/2019   DEXA SCAN  Completed   PNA vac Low Risk Adult  Completed   Fall Risk  07/04/2019 12/21/2018 10/18/2018 06/14/2018 02/08/2018  Falls in the past year? 0 0 0 0 No  Number falls in past  yr: 0 0 0 0 -  Injury with Fall? 0 0 0 0 -  Risk Factor Category  - - - - -  Risk for fall due to : - Impaired mobility - - -  Follow up - Falls evaluation completed;Falls prevention discussed - - -   Functional Status Survey:    Vitals:   02/21/20 1648  Weight: 232 lb (105.2 kg)   Body mass index is 37.45 kg/m. Physical Exam Vitals and nursing note reviewed.  Neurological:     General: No focal deficit present.     Mental Status: She is alert. Mental status is at baseline.     Comments: Oriented to self and place but not time or situation   Psychiatric:     Comments: Reflective of her life Tearful at times      Labs reviewed: Recent Labs    03/01/19 0000 10/18/19 0000  NA 140 139  K 4.2 3.9  CL  --  102  CO2  --  24*  BUN 16 13  CREATININE 0.8 0.8  CALCIUM  --  9.2   Recent  Labs    10/18/19 0000  AST 17  ALT 9  ALKPHOS 103  ALBUMIN 3.7   Recent Labs    10/18/19 0000  WBC 6.9  NEUTROABS 3  HGB 11.6*  HCT 35*  PLT 232   Lab Results  Component Value Date   TSH 3.77 10/18/2019   Lab Results  Component Value Date   HGBA1C 6.1 03/01/2019   Lab Results  Component Value Date   CHOL 200 10/18/2019   HDL 44 10/18/2019   LDLCALC 129 10/18/2019   TRIG 137 10/18/2019    Significant Diagnostic Results in last 30 days:  CUP PACEART REMOTE DEVICE CHECK  Result Date: 02/12/2020 Scheduled remote reviewed. Normal device function. AF burden <1%, all less than 1 min Next remote 91 days- JBox, RN/CVRS   Assessment/Plan 1. Vascular dementia without behavioral disturbance ([place]) MMSE 10/11/19 28/30 Progressive decline in cognition and physical function c/w the disease. Continue supportive care in the skilled environment.   2. Depression, major, recurrent, in partial remission ([place]) She is having issues with sadness regarding loss of independence and due to her short term memory loss she forgets the reasoning for her move to skilled care. I have talked with the nurses about connecting her with friends in [place], participating in activities, and possibly having a caregiver work with her and take her out for meals or shopping if her family is agreeable.     Family/ staff Communication: resident   Labs/tests ordered:  NA

## 2020-02-22 ENCOUNTER — Telehealth (INDEPENDENT_AMBULATORY_CARE_PROVIDER_SITE_OTHER): Payer: Medicare Other | Admitting: Internal Medicine

## 2020-02-22 DIAGNOSIS — I459 Conduction disorder, unspecified: Secondary | ICD-10-CM

## 2020-02-22 NOTE — Progress Notes (Signed)
Electrophysiology TeleHealth Note   Due to national recommendations of social distancing due to COVID 19, an audio/video telehealth visit is felt to be most appropriate for this patient at this time.  See MyChart message from today for the patient's consent to telehealth for [facility].   Date:  02/22/2020   ID:  [name], DOB March 12, 1929, MRN [id number]  Location: patient's home  Provider location: [address], [place]  Evaluation Performed: Follow-up visit  PCP:  Royal Hawthorn, NP  Cardiologist:  Cristopher Peru, MD  Electrophysiologist:  Dr Lovena Le  Chief Complaint:  "I am doing ok."  History of Present Illness:    [name] is a 86 y.o. female who presents via audio/video conferencing for a telehealth visit today.  She is bit tired of being in her apartment. She has not been sick. No chest pain or sob or palpitations or syncope. The patient is otherwise without complaint today.  The patient denies symptoms of fevers, chills, cough, or new SOB worrisome for COVID 19.  Past Medical History:  Diagnosis Date  . Breast cancer ([place])   . Colon polyps   . Coronary artery disease 06/2006   STEMI inferior with BMS to mid RCA with mid basal inferior wall HK and luminal irrgularities elsewhere  . Fracture of femoral neck, right ([place]) 06/13/2011  . Fracture of humeral head, right, closed 06/13/2011  . GERD (gastroesophageal reflux disease)   . Hemorrhoids   . Hyperlipemia   . Hypertension   . LBBB (left bundle branch block)   . Macular degeneration   . Myocardial infarction ([place])   . Obstructive sleep apnea on CPAP   . Osteopenia   . Osteopenia   . Pacemaker 02/25/14  . Rhinitis   . Rotator cuff syndrome   . SAH (subarachnoid hemorrhage) ([place]) 02/22/14  . 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 Ulnar neuropathy   . Umbilical hernia   . Unstable gait     Past Surgical History:  Procedure Laterality Date  . BREAST LUMPECTOMY    . CATARACT EXTRACTION,  BILATERAL     Per [facility]  . CORONARY ANGIOPLASTY WITH STENT PLACEMENT  06/2006   Mid RCA BMS  . HIP ARTHROPLASTY  06/06/2011   Procedure: ARTHROPLASTY BIPOLAR HIP;  Surgeon: Gearlean Alf;  [facility];  Service: Orthopedics;  Laterality: Right;  . PERMANENT PACEMAKER INSERTION N/A 02/25/2014   Procedure: PERMANENT PACEMAKER INSERTION;  Surgeon: Evans Lance, MD;  Location: [facility] CATH LAB;  Service: Cardiovascular;  Laterality: N/A;  . SHOULDER SURGERY    . TONSILLECTOMY     Per [facility] Records   . WRIST SURGERY      Current Outpatient Medications  Medication Sig Dispense Refill  . acetaminophen (TYLENOL) 500 MG tablet Take 500 mg by mouth every 8 (eight) hours as needed.     Marland Kitchen aspirin EC 81 MG tablet Take 81 mg by mouth daily.    . Cholecalciferol (VITAMIN D3) 2000 UNITS TABS Take 3,000 Units by mouth daily.     . Coenzyme Q10 (COQ10) 200 MG CAPS Take 1 tablet by mouth in the morning, at noon, and at bedtime.     . donepezil (ARICEPT) 10 MG tablet Take 1 tablet (10 mg total) by mouth at bedtime. 30 tablet 11  . DULoxetine (CYMBALTA) 60 MG capsule Take 60 mg by mouth daily.    . furosemide (LASIX) 40 MG tablet Take 40 mg by mouth daily.  And prn edema    . levothyroxine (SYNTHROID, LEVOTHROID) 25 MCG tablet Take 25 mcg by mouth daily before breakfast.     . losartan (COZAAR) 100 MG tablet Take 100 mg by mouth daily.    . mineral oil-hydrophilic petrolatum (AQUAPHOR) ointment Apply topically as needed for dry skin.    . Multiple Vitamins-Minerals (PRESERVISION AREDS 2+MULTI VIT PO) Take by mouth.    . nebivolol (BYSTOLIC) 10 MG tablet Take 1 tablet (10 mg total) by mouth daily. 30 tablet 5  . nitroGLYCERIN (NITROSTAT) 0.4 MG SL tablet Place 0.4 mg under the tongue every 5 (five) minutes as needed for chest pain.    . potassium chloride SA (K-DUR,KLOR-CON) 20 MEQ tablet Take 20 mEq by mouth daily.     . sodium fluoride (PREVIDENT 5000 PLUS) 1.1 % CREA dental  cream Place 1 application onto teeth every evening.    . vitamin B-12 (CYANOCOBALAMIN) 1000 MCG tablet Take 1,000 mcg by mouth daily.     No current facility-administered medications for this visit.    Allergies:   Chlorpheniramine, Codeine, Nystatin, and Amlodipine   Social History:  The patient  reports that she has never smoked. She has never used smokeless tobacco. She reports current alcohol use. She reports that she does not use drugs.   Family History:  The patient's  family history includes CAD in her father; CVA in her sister; Heart attack (age of onset: 44) in her father.   ROS:  Please see the history of present illness.   All other systems are personally reviewed and negative.    Exam:    Vital Signs:  There were no vitals taken for this visit.   Labs/Other Tests and Data Reviewed:    Recent Labs: 10/18/2019: ALT 9; BUN 13; Creatinine 0.8; Hemoglobin 11.6; Platelets 232; Potassium 3.9; Sodium 139; TSH 3.77   Wt Readings from Last 3 Encounters:  02/21/20 232 lb (105.2 kg)  01/18/20 232 lb (105.2 kg)  12/13/19 (!) 234 lb 9.6 oz (106.4 kg)     Other studies personally reviewed: Additional studies/ records that were reviewed today include: Last device remote is reviewed from [facility] PDF dated 9/21 which reveals normal device function, no arrhythmias    ASSESSMENT & PLAN:    1.  CHB - she is pacemaker dependent with no symptoms after PPM insertion. 2. PPM - her [employer] DDD PM is working normally.  3. Sleep apnea - she remains on CPAP.  4. CAD - she is s/p MI and denies any anginal symptoms.  Follow-up:  1 year Next remote: 12/21  Current medicines are reviewed at length with the patient today.   The patient does not have concerns regarding her medicines.  The following changes were made today:  none  Labs/ tests ordered today include: none No orders of the defined types were placed in this encounter.    Patient Risk:  after full review of this patients  clinical status, I feel that they are at moderate risk at this time.  Today, I have spent 15 minutes with the patient with telehealth technology discussing all of the above .    Signed, Cristopher Peru, MD  02/22/2020 4:03 PM     [address] [place] [address] [phone] (office) [phone] (fax)

## 2020-02-25 ENCOUNTER — Telehealth: Payer: Self-pay | Admitting: Interventional Cardiology

## 2020-02-25 NOTE — Telephone Encounter (Signed)
Pt recently seen by Dr. Lovena Le 02/22/20. I will send to Dr. Tanna Furry nurse.

## 2020-02-25 NOTE — Telephone Encounter (Signed)
Faxed office notes as requested.

## 2020-02-25 NOTE — Telephone Encounter (Signed)
Misty from [facility] called and stated they are needing the last office notes and any updates on meds or any new orders   Best number [phone] Fax number [phone]

## 2020-03-18 DIAGNOSIS — K219 Gastro-esophageal reflux disease without esophagitis: Secondary | ICD-10-CM | POA: Diagnosis not present

## 2020-03-18 DIAGNOSIS — R1312 Dysphagia, oropharyngeal phase: Secondary | ICD-10-CM | POA: Diagnosis not present

## 2020-03-18 DIAGNOSIS — F0150 Vascular dementia without behavioral disturbance: Secondary | ICD-10-CM | POA: Diagnosis not present

## 2020-03-20 DIAGNOSIS — R1312 Dysphagia, oropharyngeal phase: Secondary | ICD-10-CM | POA: Diagnosis not present

## 2020-03-20 DIAGNOSIS — K219 Gastro-esophageal reflux disease without esophagitis: Secondary | ICD-10-CM | POA: Diagnosis not present

## 2020-03-20 DIAGNOSIS — F0150 Vascular dementia without behavioral disturbance: Secondary | ICD-10-CM | POA: Diagnosis not present

## 2020-03-21 DIAGNOSIS — F0150 Vascular dementia without behavioral disturbance: Secondary | ICD-10-CM | POA: Diagnosis not present

## 2020-03-21 DIAGNOSIS — K219 Gastro-esophageal reflux disease without esophagitis: Secondary | ICD-10-CM | POA: Diagnosis not present

## 2020-03-21 DIAGNOSIS — R1312 Dysphagia, oropharyngeal phase: Secondary | ICD-10-CM | POA: Diagnosis not present

## 2020-03-24 ENCOUNTER — Other Ambulatory Visit: Payer: Self-pay

## 2020-03-24 ENCOUNTER — Encounter: Payer: Self-pay | Admitting: Family Medicine

## 2020-03-24 ENCOUNTER — Telehealth: Payer: Self-pay

## 2020-03-24 ENCOUNTER — Ambulatory Visit (INDEPENDENT_AMBULATORY_CARE_PROVIDER_SITE_OTHER): Payer: Medicare Other | Admitting: Family Medicine

## 2020-03-24 ENCOUNTER — Telehealth: Payer: Self-pay | Admitting: Family Medicine

## 2020-03-24 DIAGNOSIS — M1712 Unilateral primary osteoarthritis, left knee: Secondary | ICD-10-CM

## 2020-03-24 DIAGNOSIS — F0150 Vascular dementia without behavioral disturbance: Secondary | ICD-10-CM | POA: Diagnosis not present

## 2020-03-24 DIAGNOSIS — R1312 Dysphagia, oropharyngeal phase: Secondary | ICD-10-CM | POA: Diagnosis not present

## 2020-03-24 DIAGNOSIS — K219 Gastro-esophageal reflux disease without esophagitis: Secondary | ICD-10-CM | POA: Diagnosis not present

## 2020-03-24 NOTE — Telephone Encounter (Signed)
No new orders

## 2020-03-24 NOTE — Telephone Encounter (Signed)
Left this info on Misty's voice mail.

## 2020-03-24 NOTE — Telephone Encounter (Signed)
Gel injection approval request.

## 2020-03-24 NOTE — Telephone Encounter (Signed)
Misty from [facility] called in to see if patient has new orders for knee

## 2020-03-24 NOTE — Telephone Encounter (Signed)
Please advise 

## 2020-03-24 NOTE — Progress Notes (Signed)
Office Visit Note   Patient: [name]           Date of Birth: January 18, 1929           MRN: [id number] Visit Date: 03/24/2020 Requested by: Royal Hawthorn, NP [place],  [place] [address] PCP: Royal Hawthorn, NP  Subjective: Chief Complaint  Patient presents with   Left Knee - Pain    S/p cortisone injection in December of 2020. Her pain returned the end of August. Requesting another cortisone injection today. Ambulates with the aid of a walker.    HPI: He is here with recurrent left knee pain.  Injection in December 2020 gave her good relief lasting until about August.  Her knee has been swollen and painful, hard to bear weight.  She would like to try another injection.                ROS:   All other systems were reviewed and are negative.  Objective: Vital Signs: There were no vitals taken for this visit.  Physical Exam:  General:  Alert and oriented, in no acute distress. Pulm:  Breathing unlabored. Psy:  Normal mood, congruent affect.  Left knee: 1-2+ effusion with no warmth.  Limited active range of motion.  Imaging: No results found.  Assessment & Plan: 1.  Left knee DJD with effusion -Discussed options and elected to inject again with cortisone today since she got good results last time.  We will request approval for gel injection for future use if needed.     Procedures: Left knee steroid injection: After sterile prep with Betadine, injected 3 cc 1% lidocaine without epinephrine then 40 mg methylprednisolone from superolateral approach, a flash of clear yellow synovial fluid was obtained prior to injection confirming intra-articular placement.    PMFS History: Patient Active Problem List   Diagnosis Date Noted   Acquired hypothyroidism 11/01/2019   Depression, major, recurrent, in partial remission ([place]) 10/22/2019   Chronic diastolic heart failure ([place]) 07/04/2019   Morbid obesity ([place]) 07/04/2019   Dry eyes 07/04/2019    Vascular dementia without behavioral disturbance ([place]) 07/04/2019   Onychomycosis 07/04/2019   Functional urinary incontinence 10/21/2017   B12 deficiency 08/11/2017   Personal history of breast cancer 08/11/2017   Age-related osteoporosis without current pathological fracture 08/11/2017   Old MI (myocardial infarction) 03/27/2015   Venous insufficiency 06/10/2014   Aortic insufficiency 05/28/2014   Unstable gait    Subarachnoid hemorrhage ([place]) 02/25/2014   Pacemaker 02/25/2014   Syncope and collapse 02/22/2014   Stokes Adams attack 02/22/2014   Coronary atherosclerosis of native coronary artery 07/27/2013   Obstructive sleep apnea on CPAP 02/26/2013   Obesity (BMI 30-39.9) 02/26/2013   Myocardial infarction ([place])    Rhinitis    Rotator cuff syndrome    Hyperlipemia    Anemia    GERD (gastroesophageal reflux disease)    Fracture of femoral neck, right ([place]) 06/13/2011   Fracture of humeral head, right, closed 06/13/2011   HTN (hypertension) 06/13/2011   Coronary artery disease 06/24/2006   Past Medical History:  Diagnosis Date   Breast cancer ([place])    Colon polyps    Coronary artery disease 06/2006   STEMI inferior with BMS to mid RCA with mid basal inferior wall HK and luminal irrgularities elsewhere   Fracture of femoral neck, right ([place]) 06/13/2011   Fracture of humeral head, right, closed 06/13/2011   GERD (gastroesophageal reflux disease)    Hemorrhoids  Hyperlipemia    Hypertension    LBBB (left bundle branch block)    Macular degeneration    Myocardial infarction ([place])    Obstructive sleep apnea on CPAP    Osteopenia    Osteopenia    Pacemaker 02/25/14   Rhinitis    Rotator cuff syndrome    SAH (subarachnoid hemorrhage) ([place]) 02/22/14   Stokes Adams attack 02/22/14   Ulnar neuropathy    Umbilical hernia    Unstable gait     Family History  Problem Relation Age of Onset   Heart attack Father 39   CAD  Father    CVA Sister     Past Surgical History:  Procedure Laterality Date   BREAST LUMPECTOMY     CATARACT EXTRACTION, BILATERAL     Per [facility]   CORONARY ANGIOPLASTY WITH STENT PLACEMENT  06/2006   Mid RCA BMS   HIP ARTHROPLASTY  06/06/2011   Procedure: ARTHROPLASTY BIPOLAR HIP;  Surgeon: Gearlean Alf;  Location[facility];  Service: Orthopedics;  Laterality: Right;   PERMANENT PACEMAKER INSERTION N/A 02/25/2014   Procedure: PERMANENT PACEMAKER INSERTION;  Surgeon: Evans Lance, MD;  Location: [facility] CATH LAB;  Service: Cardiovascular;  Laterality: N/A;   SHOULDER SURGERY     TONSILLECTOMY     Per [facility] Records    WRIST SURGERY     Social History   Occupational History   Not on file  Tobacco Use   Smoking status: Never Smoker   Smokeless tobacco: Never Used  Vaping Use   Vaping Use: Never used  Substance and Sexual Activity   Alcohol use: Yes    Alcohol/week: 0.0 standard drinks    Comment: occasional wine   Drug use: Never   Sexual activity: Not on file

## 2020-03-25 DIAGNOSIS — K219 Gastro-esophageal reflux disease without esophagitis: Secondary | ICD-10-CM | POA: Diagnosis not present

## 2020-03-25 DIAGNOSIS — R1312 Dysphagia, oropharyngeal phase: Secondary | ICD-10-CM | POA: Diagnosis not present

## 2020-03-25 DIAGNOSIS — F0150 Vascular dementia without behavioral disturbance: Secondary | ICD-10-CM | POA: Diagnosis not present

## 2020-03-26 DIAGNOSIS — R1312 Dysphagia, oropharyngeal phase: Secondary | ICD-10-CM | POA: Diagnosis not present

## 2020-03-26 DIAGNOSIS — F0150 Vascular dementia without behavioral disturbance: Secondary | ICD-10-CM | POA: Diagnosis not present

## 2020-03-26 DIAGNOSIS — K219 Gastro-esophageal reflux disease without esophagitis: Secondary | ICD-10-CM | POA: Diagnosis not present

## 2020-03-27 ENCOUNTER — Telehealth: Payer: Self-pay

## 2020-03-27 NOTE — Telephone Encounter (Signed)
Noted  

## 2020-03-27 NOTE — Telephone Encounter (Signed)
Submitted VOB, Monovisc, left knee.

## 2020-04-01 DIAGNOSIS — F0150 Vascular dementia without behavioral disturbance: Secondary | ICD-10-CM | POA: Diagnosis not present

## 2020-04-01 DIAGNOSIS — K219 Gastro-esophageal reflux disease without esophagitis: Secondary | ICD-10-CM | POA: Diagnosis not present

## 2020-04-01 DIAGNOSIS — R1312 Dysphagia, oropharyngeal phase: Secondary | ICD-10-CM | POA: Diagnosis not present

## 2020-04-03 DIAGNOSIS — Z23 Encounter for immunization: Secondary | ICD-10-CM | POA: Diagnosis not present

## 2020-04-04 DIAGNOSIS — K219 Gastro-esophageal reflux disease without esophagitis: Secondary | ICD-10-CM | POA: Diagnosis not present

## 2020-04-04 DIAGNOSIS — R1312 Dysphagia, oropharyngeal phase: Secondary | ICD-10-CM | POA: Diagnosis not present

## 2020-04-04 DIAGNOSIS — F0150 Vascular dementia without behavioral disturbance: Secondary | ICD-10-CM | POA: Diagnosis not present

## 2020-04-15 ENCOUNTER — Encounter: Payer: Self-pay | Admitting: Internal Medicine

## 2020-04-15 NOTE — Progress Notes (Signed)
This encounter was created in error - please disregard.

## 2020-04-21 ENCOUNTER — Encounter: Payer: Self-pay | Admitting: Adult Health

## 2020-04-21 ENCOUNTER — Non-Acute Institutional Stay (SKILLED_NURSING_FACILITY): Payer: Medicare Other | Admitting: Adult Health

## 2020-04-21 DIAGNOSIS — M25562 Pain in left knee: Secondary | ICD-10-CM | POA: Diagnosis not present

## 2020-04-21 DIAGNOSIS — W19XXXA Unspecified fall, initial encounter: Secondary | ICD-10-CM

## 2020-04-21 NOTE — Progress Notes (Signed)
Location:  Occupational psychologist of Service:  SNF (31) Provider:  Cindi Carbon, ANP [place] [phone]  Gayland Curry, DO  Patient Care Team: Gayland Curry, DO as PCP - General (Geriatric Medicine) Sueanne Margarita, MD as PCP - Sleep Medicine (Sleep Medicine) Evans Lance, MD as PCP - Cardiology (Cardiology) Justice Britain, MD as Consulting Physician (Orthopedic Surgery) Gaynelle Arabian, MD as Consulting Physician (Orthopedic Surgery) Garvin Fila, MD as Consulting Physician (Neurology) Evans Lance, MD as Consulting Physician (Cardiology) Jettie Booze, MD as Consulting Physician (Interventional Cardiology) Shon Hough, MD as Consulting Physician (Ophthalmology) Community, [facility] ([place]) Royal Hawthorn, NP as Nurse Practitioner (Nurse Practitioner)  Extended Emergency Contact Information Primary Emergency Contact: Schar,JED Address: [place], [place] of [place] Phone: [phone] Mobile Phone: [phone] Relation: Son Secondary Emergency Contact: [place] Mobile Phone: [phone] Relation: Daughter  Code Status:  DNR Goals of care: Advanced Directive information Advanced Directives 10/09/2019  Does Patient Have a Medical Advance Directive? Yes  Type of Paramedic of [place];Out of facility DNR (pink MOST or yellow form)  Does patient want to make changes to medical advance directive? No - Patient declined  Copy of [place] in Chart? Yes - validated most recent copy scanned in chart (See row information)  Pre-existing out of facility DNR order (yellow form or pink MOST form) Pink MOST/Yellow Form most recent copy in chart - Physician notified to receive inpatient order     Chief Complaint  Patient presents with   Acute Visit    left knee pain    HPI:  Pt is a 86 y.o. female  seen today for an acute visit for left knee pain. Ms. Dack was in the BR and leaned forward to pick something up and slid to the floor. She landed on her bottom. The staff got her up with three people and a gait belt. Later she began to feel left knee pain. She has a hx of chronic OA to the left knee and had an injection on 11/1.  She typically is ambulating with a walker but at this time she is using a wheelchair because putting weight on the left knee causes pain. There is no deformity, redness, or edema at this time.    Past Medical History:  Diagnosis Date   Breast cancer ([place])    Colon polyps    Coronary artery disease 06/2006   STEMI inferior with BMS to mid RCA with mid basal inferior wall HK and luminal irrgularities elsewhere   Fracture of femoral neck, right ([place]) 06/13/2011   Fracture of humeral head, right, closed 06/13/2011   GERD (gastroesophageal reflux disease)    Hemorrhoids    Hyperlipemia    Hypertension    LBBB (left bundle branch block)    Macular degeneration    Myocardial infarction ([place])    Obstructive sleep apnea on CPAP    Osteopenia    Osteopenia    Pacemaker 02/25/14   Rhinitis    Rotator cuff syndrome    SAH (subarachnoid hemorrhage) ([place]) 02/22/14   Stokes Adams attack 02/22/14   Ulnar neuropathy    Umbilical hernia    Unstable gait    Past Surgical History:  Procedure Laterality Date   BREAST LUMPECTOMY     CATARACT EXTRACTION, BILATERAL     Per [facility] Records  CORONARY ANGIOPLASTY WITH STENT PLACEMENT  06/2006   Mid RCA BMS   HIP ARTHROPLASTY  06/06/2011   Procedure: ARTHROPLASTY BIPOLAR HIP;  Surgeon: Gearlean Alf;  Location: [facility];  Service: Orthopedics;  Laterality: Right;   PERMANENT PACEMAKER INSERTION N/A 02/25/2014   Procedure: PERMANENT PACEMAKER INSERTION;  Surgeon: Evans Lance, MD;  Location: [facility] CATH LAB;  Service: Cardiovascular;  Laterality: N/A;   SHOULDER SURGERY     TONSILLECTOMY     Per  [facility] Records    WRIST SURGERY      Allergies  Allergen Reactions   Chlorpheniramine Anaphylaxis   Codeine Anaphylaxis   Nystatin Rash    redness   Amlodipine     Per [facility] Records     Outpatient Encounter Medications as of 04/21/2020  Medication Sig   acetaminophen (TYLENOL) 500 MG tablet Take 500 mg by mouth every 8 (eight) hours as needed.    aspirin EC 81 MG tablet Take 81 mg by mouth daily.   Cholecalciferol (VITAMIN D3) 2000 UNITS TABS Take 3,000 Units by mouth daily.    Coenzyme Q10 (COQ10) 200 MG CAPS Take 1 tablet by mouth in the morning, at noon, and at bedtime.    donepezil (ARICEPT) 10 MG tablet Take 1 tablet (10 mg total) by mouth at bedtime.   DULoxetine (CYMBALTA) 60 MG capsule Take 60 mg by mouth daily.   furosemide (LASIX) 40 MG tablet Take 40 mg by mouth daily. And prn edema   levothyroxine (SYNTHROID, LEVOTHROID) 25 MCG tablet Take 25 mcg by mouth daily before breakfast.    losartan (COZAAR) 100 MG tablet Take 100 mg by mouth daily.   mineral oil-hydrophilic petrolatum (AQUAPHOR) ointment Apply topically as needed for dry skin.   Multiple Vitamins-Minerals (PRESERVISION AREDS 2+MULTI VIT PO) Take by mouth.   nebivolol (BYSTOLIC) 10 MG tablet Take 1 tablet (10 mg total) by mouth daily.   nitroGLYCERIN (NITROSTAT) 0.4 MG SL tablet Place 0.4 mg under the tongue every 5 (five) minutes as needed for chest pain.   potassium chloride SA (K-DUR,KLOR-CON) 20 MEQ tablet Take 20 mEq by mouth daily.    sodium fluoride (PREVIDENT 5000 PLUS) 1.1 % CREA dental cream Place 1 application onto teeth every evening.   vitamin B-12 (CYANOCOBALAMIN) 1000 MCG tablet Take 1,000 mcg by mouth daily.   No facility-administered encounter medications on file as of 04/21/2020.    Review of Systems  Constitutional: Positive for activity change. Negative for appetite change, chills, diaphoresis, fatigue, fever and unexpected weight change.    Cardiovascular: Positive for leg swelling.  Musculoskeletal: Positive for gait problem.       Left knee pain    Immunization History  Administered Date(s) Administered   Influenza, High Dose Seasonal PF 02/18/2017, 03/22/2019   Influenza,inj,Quad PF,6+ Mos 03/24/2018   Influenza-Unspecified 03/04/2016   Moderna SARS-COVID-2 Vaccination 06/04/2019, 07/03/2019   Pneumococcal Conjugate-13 05/08/2014   Pneumococcal Polysaccharide-23 10/08/2017   Td 07/09/2002, 01/16/2013   Zoster 06/30/2010   Zoster Recombinat (Shingrix) 11/18/2017, 01/17/2018   Pertinent  Health Maintenance Due  Topic Date Due   INFLUENZA VACCINE  12/23/2019   DEXA SCAN  Completed   PNA vac Low Risk Adult  Completed   Fall Risk  07/04/2019 12/21/2018 10/18/2018 06/14/2018 02/08/2018  Falls in the past year? 0 0 0 0 No  Number falls in past yr: 0 0 0 0 -  Injury with Fall? 0 0 0 0 -  Risk Factor Category  - - - - -  Risk for fall due to : - Impaired mobility - - -  Follow up - Falls evaluation completed;Falls prevention discussed - - -   Functional Status Survey:    There were no vitals filed for this visit. There is no height or weight on file to calculate BMI. Physical Exam Constitutional:      Appearance: Normal appearance.  Musculoskeletal:     Right knee: Normal.     Left knee: No swelling, deformity, effusion, erythema, ecchymosis, lacerations, bony tenderness or crepitus. Normal range of motion. Tenderness present over the medial joint line. No LCL laxity, MCL laxity, ACL laxity or PCL laxity.Normal alignment, normal meniscus and normal patellar mobility. Normal pulse.     Instability Tests: Anterior drawer test negative.     Right lower leg: Edema present.     Left lower leg: Edema present.     Comments: Pain produced with anterior drawer test   Neurological:     Mental Status: She is alert.     Labs reviewed: Recent Labs    10/18/19 0000  NA 139  K 3.9  CL 102  CO2 24*  BUN 13   CREATININE 0.8  CALCIUM 9.2   Recent Labs    10/18/19 0000  AST 17  ALT 9  ALKPHOS 103  ALBUMIN 3.7   Recent Labs    10/18/19 0000  WBC 6.9  NEUTROABS 3  HGB 11.6*  HCT 35*  PLT 232   Lab Results  Component Value Date   TSH 3.77 10/18/2019   Lab Results  Component Value Date   HGBA1C 6.1 03/01/2019   Lab Results  Component Value Date   CHOL 200 10/18/2019   HDL 44 10/18/2019   LDLCALC 129 10/18/2019   TRIG 137 10/18/2019    Significant Diagnostic Results in last 30 days:  No results found.  Assessment/Plan 1. Fall, initial encounter Recommend using a grabber to pick up things (she has one) and calling for help with all transfers. Due to her underlying memory loss this is challenging  2. Acute pain of left knee Xray left knee Tylenol 650  Mg tid scheduled x 1 week and hold prn dosing while on scheduled course.       Labs/tests ordered:  Left knee xray

## 2020-04-25 ENCOUNTER — Telehealth: Payer: Self-pay

## 2020-04-25 NOTE — Telephone Encounter (Signed)
Approved for [facility]-Left knee Dr. [facility] No copay 20%OOP No prior auth required  [facility] to schedule @ next available

## 2020-04-28 NOTE — Telephone Encounter (Signed)
Called to schedule. Pt's Daughter said mom had a recent fall and injured her knee. She stated she is unsure how bad the swelling is but was told nothing was broken. Due to this, she wanted to call back to schedule gel injection.

## 2020-05-13 ENCOUNTER — Ambulatory Visit (INDEPENDENT_AMBULATORY_CARE_PROVIDER_SITE_OTHER): Payer: Medicare Other

## 2020-05-13 DIAGNOSIS — I4430 Unspecified atrioventricular block: Secondary | ICD-10-CM | POA: Diagnosis not present

## 2020-05-14 LAB — CUP PACEART REMOTE DEVICE CHECK
Battery Remaining Longevity: 104 mo
Battery Remaining Percentage: 89 %
Battery Voltage: 2.95 V
Brady Statistic AP VP Percent: 72 %
Brady Statistic AP VS Percent: 1 %
Brady Statistic AS VP Percent: 26 %
Brady Statistic AS VS Percent: 1 %
Brady Statistic RA Percent Paced: 71 %
Brady Statistic RV Percent Paced: 98 %
Date Time Interrogation Session: 20211221140751
Implantable Lead Implant Date: 20151005
Implantable Lead Implant Date: 20151005
Implantable Lead Location: 753859
Implantable Lead Location: 753860
Implantable Pulse Generator Implant Date: 20151005
Lead Channel Impedance Value: 440 Ohm
Lead Channel Impedance Value: 590 Ohm
Lead Channel Pacing Threshold Amplitude: 0.75 V
Lead Channel Pacing Threshold Amplitude: 1.125 V
Lead Channel Pacing Threshold Pulse Width: 0.5 ms
Lead Channel Pacing Threshold Pulse Width: 0.5 ms
Lead Channel Sensing Intrinsic Amplitude: 12 mV
Lead Channel Sensing Intrinsic Amplitude: 4.6 mV
Lead Channel Setting Pacing Amplitude: 1 V
Lead Channel Setting Pacing Amplitude: 2.125
Lead Channel Setting Pacing Pulse Width: 0.5 ms
Lead Channel Setting Sensing Sensitivity: 4 mV
Pulse Gen Model: 2240
Pulse Gen Serial Number: 7666288

## 2020-05-15 ENCOUNTER — Non-Acute Institutional Stay (SKILLED_NURSING_FACILITY): Payer: Medicare Other | Admitting: Adult Health

## 2020-05-15 ENCOUNTER — Encounter: Payer: Self-pay | Admitting: Adult Health

## 2020-05-15 DIAGNOSIS — M25562 Pain in left knee: Secondary | ICD-10-CM | POA: Diagnosis not present

## 2020-05-15 DIAGNOSIS — G8929 Other chronic pain: Secondary | ICD-10-CM | POA: Diagnosis not present

## 2020-05-15 DIAGNOSIS — I5032 Chronic diastolic (congestive) heart failure: Secondary | ICD-10-CM

## 2020-05-15 DIAGNOSIS — R2681 Unsteadiness on feet: Secondary | ICD-10-CM | POA: Diagnosis not present

## 2020-05-15 DIAGNOSIS — F0150 Vascular dementia without behavioral disturbance: Secondary | ICD-10-CM

## 2020-05-15 DIAGNOSIS — Z9989 Dependence on other enabling machines and devices: Secondary | ICD-10-CM | POA: Diagnosis not present

## 2020-05-15 DIAGNOSIS — G4733 Obstructive sleep apnea (adult) (pediatric): Secondary | ICD-10-CM | POA: Diagnosis not present

## 2020-05-15 DIAGNOSIS — I2510 Atherosclerotic heart disease of native coronary artery without angina pectoris: Secondary | ICD-10-CM

## 2020-05-15 DIAGNOSIS — E669 Obesity, unspecified: Secondary | ICD-10-CM

## 2020-05-15 NOTE — Progress Notes (Signed)
Location:  Occupational psychologist of Service:  SNF (31) Provider:  Cindi Carbon, ANP [place] [phone]   Gayland Curry, DO  Patient Care Team: Gayland Curry, DO as PCP - General (Geriatric Medicine) Sueanne Margarita, MD as PCP - Sleep Medicine (Sleep Medicine) Evans Lance, MD as PCP - Cardiology (Cardiology) Justice Britain, MD as Consulting Physician (Orthopedic Surgery) Gaynelle Arabian, MD as Consulting Physician (Orthopedic Surgery) Garvin Fila, MD as Consulting Physician (Neurology) Evans Lance, MD as Consulting Physician (Cardiology) Jettie Booze, MD as Consulting Physician (Interventional Cardiology) Shon Hough, MD as Consulting Physician (Ophthalmology) Community, [facility] ([place]) Royal Hawthorn, NP as Nurse Practitioner (Nurse Practitioner)  Extended Emergency Contact Information Primary Emergency Contact: Hammerstrom,JED Address: [place], [place] of [place] Phone: [phone] Mobile Phone: [phone] Relation: Son Secondary Emergency Contact: [place] Mobile Phone: [phone] Relation: Daughter  Code Status:  DNR Goals of care: Advanced Directive information Advanced Directives 10/09/2019  Does Patient Have a Medical Advance Directive? Yes  Type of Paramedic of [place];Out of facility DNR (pink MOST or yellow form)  Does patient want to make changes to medical advance directive? No - Patient declined  Copy of [place] in Chart? Yes - validated most recent copy scanned in chart (See row information)  Pre-existing out of facility DNR order (yellow form or pink MOST form) Pink MOST/Yellow Form most recent copy in chart - Physician notified to receive inpatient order     Chief Complaint  Patient presents with  . Medical Management of Chronic Issues    HPI:  Pt is a 86 y.o.  female seen today for medical management of chronic diseases.    She has left knee pain due to OA s/p injection on 11/1.  Later in Nov she fell and was not able to ambulate but since then she has improved and is now walking with a walker.  She reports her pain is mild and she does not feel she needs scheduled tylenol at this time.   She has dementia and has short term memory loss but remains able to communicate her needs and participate in activities.   There are no issues with her blood pressure, sob or chest pain.   She has chronic edema in both legs and wears compression hose.    Wears cpap each night and denies any issues sleeping or falling asleep in the day time unexpectedly.    She has gained 6 lbs in the past 3 months.  Wt Readings from Last 3 Encounters:  05/15/20 238 lb (108 kg)  02/21/20 232 lb (105.2 kg)  01/18/20 232 lb (105.2 kg)     Past Medical History:  Diagnosis Date  . Breast cancer ([place])   . Colon polyps   . Coronary artery disease 06/2006   STEMI inferior with BMS to mid RCA with mid basal inferior wall HK and luminal irrgularities elsewhere  . Fracture of femoral neck, right ([place]) 06/13/2011  . Fracture of humeral head, right, closed 06/13/2011  . GERD (gastroesophageal reflux disease)   . Hemorrhoids   . Hyperlipemia   . Hypertension   . LBBB (left bundle branch block)   . Macular degeneration   . Myocardial infarction ([place])   . Obstructive sleep apnea on CPAP   . Osteopenia   . Osteopenia   . Pacemaker 02/25/14  .  Rhinitis   . Rotator cuff syndrome   . SAH (subarachnoid hemorrhage) ([facility]) 02/22/14  . 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 Ulnar neuropathy   . Umbilical hernia   . Unstable gait    Past Surgical History:  Procedure Laterality Date  . BREAST LUMPECTOMY    . CATARACT EXTRACTION, BILATERAL     Per [facility] Records  . CORONARY ANGIOPLASTY WITH STENT PLACEMENT  06/2006   Mid RCA BMS  . HIP ARTHROPLASTY  06/06/2011   Procedure:  ARTHROPLASTY BIPOLAR HIP;  Surgeon: Loanne Drilling;  Location: [facility];  Service: Orthopedics;  Laterality: Right;  . PERMANENT PACEMAKER INSERTION N/A 02/25/2014   Procedure: PERMANENT PACEMAKER INSERTION;  Surgeon: Marinus Maw, MD;  Location: [facility] CATH LAB;  Service: Cardiovascular;  Laterality: N/A;  . SHOULDER SURGERY    . TONSILLECTOMY     Per [facility] Records   . WRIST SURGERY      Allergies  Allergen Reactions  . Chlorpheniramine Anaphylaxis  . Codeine Anaphylaxis  . Nystatin Rash    redness  . Amlodipine     Per [facility] Records     Outpatient Encounter Medications as of 05/15/2020  Medication Sig  . acetaminophen (TYLENOL) 500 MG tablet Take 500 mg by mouth every 8 (eight) hours as needed.   Marland Kitchen aspirin EC 81 MG tablet Take 81 mg by mouth daily.  . Cholecalciferol (VITAMIN D3) 2000 UNITS TABS Take 3,000 Units by mouth daily.   Marland Kitchen donepezil (ARICEPT) 10 MG tablet Take 1 tablet (10 mg total) by mouth at bedtime.  . DULoxetine (CYMBALTA) 60 MG capsule Take 60 mg by mouth daily.  . furosemide (LASIX) 40 MG tablet Take 40 mg by mouth daily. And prn edema  . levothyroxine (SYNTHROID, LEVOTHROID) 25 MCG tablet Take 25 mcg by mouth daily before breakfast.   . losartan (COZAAR) 100 MG tablet Take 100 mg by mouth daily.  . Multiple Vitamins-Minerals (PRESERVISION AREDS 2+MULTI VIT PO) Take by mouth.  . nebivolol (BYSTOLIC) 10 MG tablet Take 1 tablet (10 mg total) by mouth daily.  . potassium chloride SA (K-DUR,KLOR-CON) 20 MEQ tablet Take 20 mEq by mouth daily.   . sodium fluoride (PREVIDENT 5000 PLUS) 1.1 % CREA dental cream Place 1 application onto teeth every evening.  . mineral oil-hydrophilic petrolatum (AQUAPHOR) ointment Apply topically as needed for dry skin.  Marland Kitchen nitroGLYCERIN (NITROSTAT) 0.4 MG SL tablet Place 0.4 mg under the tongue every 5 (five) minutes as needed.  . vitamin B-12 (CYANOCOBALAMIN) 1000 MCG tablet Take 1,000 mcg by mouth daily.  .  [DISCONTINUED] Coenzyme Q10 (COQ10) 200 MG CAPS Take 1 tablet by mouth in the morning, at noon, and at bedtime.    No facility-administered encounter medications on file as of 05/15/2020.    Review of Systems  Constitutional: Negative for activity change, appetite change, chills, diaphoresis, fatigue and fever.       Gaining weight  HENT: Negative for congestion.   Respiratory: Negative for cough, shortness of breath and wheezing.   Cardiovascular: Positive for leg swelling. Negative for chest pain and palpitations.  Gastrointestinal: Negative for abdominal distention, abdominal pain, constipation and diarrhea.  Genitourinary: Negative for difficulty urinating and dysuria.  Musculoskeletal: Positive for arthralgias (left knee) and gait problem. Negative for back pain, joint swelling and myalgias.  Neurological: Negative for dizziness, tremors, seizures, syncope, facial asymmetry, speech difficulty, weakness, light-headedness, numbness and headaches.  Psychiatric/Behavioral: Positive for confusion. Negative for agitation, behavioral problems, dysphoric mood, hallucinations, self-injury,  sleep disturbance and suicidal ideas. The patient is not nervous/anxious and is not hyperactive.     Immunization History  Administered Date(s) Administered  . Influenza, High Dose Seasonal PF 02/18/2017, 03/22/2019, 03/14/2020  . Influenza,inj,Quad PF,6+ Mos 03/24/2018  . Influenza-Unspecified 03/04/2016, 03/18/2020  . Moderna Sars-Covid-2 Vaccination 06/04/2019, 07/03/2019  . Pneumococcal Conjugate-13 05/08/2014  . Pneumococcal Polysaccharide-23 10/08/2017  . Td 07/09/2002, 01/16/2013  . Zoster 06/30/2010  . Zoster Recombinat (Shingrix) 11/18/2017, 01/17/2018   Pertinent  Health Maintenance Due  Topic Date Due  . INFLUENZA VACCINE  Completed  . DEXA SCAN  Completed  . PNA vac Low Risk Adult  Completed   Fall Risk  07/04/2019 12/21/2018 10/18/2018 06/14/2018 02/08/2018  Falls in the past year? 0 0 0 0  No  Number falls in past yr: 0 0 0 0 -  Injury with Fall? 0 0 0 0 -  Risk Factor Category  - - - - -  Risk for fall due to : - Impaired mobility - - -  Follow up - Falls evaluation completed;Falls prevention discussed - - -   Functional Status Survey:    Vitals:   05/15/20 1201  Weight: 238 lb (108 kg)   Body mass index is 38.41 kg/m.  Wt Readings from Last 3 Encounters:  05/15/20 238 lb (108 kg)  02/21/20 232 lb (105.2 kg)  01/18/20 232 lb (105.2 kg)    Physical Exam Vitals and nursing note reviewed.  Constitutional:      General: She is not in acute distress.    Appearance: She is not diaphoretic.  HENT:     Head: Normocephalic and atraumatic.     Nose: No congestion.     Mouth/Throat:     Mouth: Mucous membranes are moist.  Eyes:     Conjunctiva/sclera: Conjunctivae normal.     Pupils: Pupils are equal, round, and reactive to light.  Neck:     Thyroid: No thyromegaly.     Vascular: No carotid bruit or JVD.  Cardiovascular:     Rate and Rhythm: Normal rate.     Heart sounds: Normal heart sounds. No murmur heard.   Pulmonary:     Effort: Pulmonary effort is normal. No respiratory distress.     Breath sounds: Normal breath sounds. No stridor.  Abdominal:     General: Bowel sounds are normal.     Palpations: Abdomen is soft.  Musculoskeletal:     Cervical back: No rigidity. No muscular tenderness.     Right lower leg: Edema (+1) present.     Left lower leg: Edema (+1) present.     Comments: Left knee with crepitus noted on ROM. No redness, warmth, or edema. NO pain on palpation or with ROM  Lymphadenopathy:     Cervical: No cervical adenopathy.  Skin:    General: Skin is warm and dry.  Neurological:     General: No focal deficit present.     Mental Status: She is alert. Mental status is at baseline.     Cranial Nerves: No cranial nerve deficit.  Psychiatric:        Mood and Affect: Mood normal.     Labs reviewed: Recent Labs    10/18/19 0000  NA  139  K 3.9  CL 102  CO2 24*  BUN 13  CREATININE 0.8  CALCIUM 9.2   Recent Labs    10/18/19 0000  AST 17  ALT 9  ALKPHOS 103  ALBUMIN 3.7   Recent Labs  10/18/19 0000  WBC 6.9  NEUTROABS 3  HGB 11.6*  HCT 35*  PLT 232   Lab Results  Component Value Date   TSH 3.77 10/18/2019   Lab Results  Component Value Date   HGBA1C 6.1 03/01/2019   Lab Results  Component Value Date   CHOL 200 10/18/2019   HDL 44 10/18/2019   LDLCALC 129 10/18/2019   TRIG 137 10/18/2019    Significant Diagnostic Results in last 30 days:  CUP PACEART REMOTE DEVICE CHECK  Result Date: 05/14/2020 Scheduled remote reviewed. Normal device function.  1 new AMS event logged 6 seconds; EGM suggest AT Next remote 91 days. HB   Assessment/Plan 1. Chronic pain of left knee Continues to bother her at times but she is now able to ambulate with her walker Continue Tylenol as needed and f/u with ortho if worsening.   2. Obstructive sleep apnea on CPAP No current sleep issues reported Continue cpap qhs   3. Chronic diastolic heart failure (HCC) Compensated. Weight gain is due to her intake Continue lasix 40 mg qd and Kdur 20 meq qd   4. Coronary artery disease involving native coronary artery of native heart without angina pectoris Continues on baby aspirin and Bystolic. No new issues.  Followed by cardiology   5. Vascular dementia without behavioral disturbance (HCC) MMSE 28/30 Poor recall Continues to benefit from Aricept and the skilled care environment   6. Obesity (BMI 30-39.9) Encourage smaller portions   7. Unstable gait Recommend walker with all ambulation due to left knee issues and unsteady gait     Family/ staff Communication: resident   Labs/tests ordered:  NA

## 2020-05-27 NOTE — Progress Notes (Signed)
Remote pacemaker transmission.   

## 2020-06-03 ENCOUNTER — Ambulatory Visit: Payer: Medicare Other | Admitting: Family Medicine

## 2020-06-18 ENCOUNTER — Other Ambulatory Visit: Payer: Self-pay | Admitting: Internal Medicine

## 2020-06-18 MED ORDER — LORAZEPAM 0.5 MG PO TABS: 0.5000 mg | ORAL_TABLET | Freq: Four times a day (QID) | ORAL | 0 refills | Status: DC | PRN

## 2020-07-10 ENCOUNTER — Encounter: Payer: Self-pay | Admitting: Adult Health

## 2020-07-10 ENCOUNTER — Non-Acute Institutional Stay (SKILLED_NURSING_FACILITY): Payer: Medicare Other | Admitting: Adult Health

## 2020-07-10 DIAGNOSIS — F4321 Adjustment disorder with depressed mood: Secondary | ICD-10-CM

## 2020-07-10 DIAGNOSIS — F5102 Adjustment insomnia: Secondary | ICD-10-CM

## 2020-07-10 DIAGNOSIS — R531 Weakness: Secondary | ICD-10-CM | POA: Diagnosis not present

## 2020-07-10 DIAGNOSIS — R251 Tremor, unspecified: Secondary | ICD-10-CM

## 2020-07-10 MED ORDER — LORAZEPAM 0.5 MG PO TABS: 0.2500 mg | ORAL_TABLET | Freq: Every day | ORAL | 1 refills | Status: DC

## 2020-07-10 NOTE — Addendum Note (Signed)
Addended by: Barnie Mort on: 07/10/2020 04:09 PM   Modules accepted: Orders

## 2020-07-10 NOTE — Progress Notes (Signed)
Location:  [facility] Room Number: 125-A Place of Service:  SNF (31) Provider:  Cindi Carbon, NP   Patient Care Team: Gayland Curry, DO as PCP - General (Geriatric Medicine) Sueanne Margarita, MD as PCP - Sleep Medicine (Sleep Medicine) Evans Lance, MD as PCP - Cardiology (Cardiology) Justice Britain, MD as Consulting Physician (Orthopedic Surgery) Gaynelle Arabian, MD as Consulting Physician (Orthopedic Surgery) Garvin Fila, MD as Consulting Physician (Neurology) Evans Lance, MD as Consulting Physician (Cardiology) Jettie Booze, MD as Consulting Physician (Interventional Cardiology) Shon Hough, MD as Consulting Physician (Ophthalmology) Community, [facility] ([place]) Royal Hawthorn, NP as Nurse Practitioner (Nurse Practitioner)  Extended Emergency Contact Information Primary Emergency Contact: Graefe,JED Address: [address], [place] Phone: [phone] Mobile Phone: [phone] Relation: Son Secondary Emergency Contact: [place] Mobile Phone: [phone] Relation: Daughter  Code Status:  DNR  Goals of care: Advanced Directive information Advanced Directives 07/10/2020  Does Patient Have a Medical Advance Directive? Yes  Type of Paramedic of [place];Out of facility DNR (pink MOST or yellow form)  Does patient want to make changes to medical advance directive? No - Patient declined  Copy of [place] in Chart? Yes - validated most recent copy scanned in chart (See row information)  Pre-existing out of facility DNR order (yellow form or pink MOST form) Yellow form placed in chart (order not valid for inpatient use)     Chief Complaint  Patient presents with  . Acute Visit    Insomnia     HPI:  Pt is a 87 y.o. female seen today for an acute visit for insomnia and grief. [name]  recently lost her husband of many years and is experiencing sadness, loneliness, and grief. She reports that she is eating but food has no taste. She is not sleeping well at night. Sometimes she wakes up anxious, confused, and is having bad nightmares. She feels like she is in denial about her late husband's death and is very emotional. She also has dementia and this may further confound her difficulty grieving. She is taking ativan as needed for anxiety which seems to be helping but is not using it with regularity.   Also her nurse reports she has a slight tremor to her left hand but it does not effect her eating or drinking. The tremor is present at rest and with movements. She is not having any rigidity or slow movements.   Her daughter in law is at the bedside and suggested that she participate in physical therapy as she is minimally walking now and has chronic knee pain. The weakness is not new but worse over the past two months. She feels she needs a goal and physical activity to help her with weakness and her loss.    Past Medical History:  Diagnosis Date  . Breast cancer ([place])   . Colon polyps   . Coronary artery disease 06/2006   STEMI inferior with BMS to mid RCA with mid basal inferior wall HK and luminal irrgularities elsewhere  . Fracture of femoral neck, right ([place]) 06/13/2011  . Fracture of humeral head, right, closed 06/13/2011  . GERD (gastroesophageal reflux disease)   . Hemorrhoids   . Hyperlipemia   . Hypertension   . LBBB (left bundle branch block)   . Macular degeneration   . Myocardial infarction ([place])   .  Obstructive sleep apnea on CPAP   . Osteopenia   . Osteopenia   . Pacemaker 02/25/14  . Rhinitis   . Rotator cuff syndrome   . SAH (subarachnoid hemorrhage) ([place]) 02/22/14  . 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 Ulnar neuropathy   . Umbilical hernia   . Unstable gait    Past Surgical History:  Procedure Laterality Date  . BREAST LUMPECTOMY    . CATARACT EXTRACTION,  BILATERAL     Per [facility]  . CORONARY ANGIOPLASTY WITH STENT PLACEMENT  06/2006   Mid RCA BMS  . HIP ARTHROPLASTY  06/06/2011   Procedure: ARTHROPLASTY BIPOLAR HIP;  Surgeon: Gearlean Alf;  Location: [facility];  Service: Orthopedics;  Laterality: Right;  . PERMANENT PACEMAKER INSERTION N/A 02/25/2014   Procedure: PERMANENT PACEMAKER INSERTION;  Surgeon: Evans Lance, MD;  Location: [facility] CATH LAB;  Service: Cardiovascular;  Laterality: N/A;  . SHOULDER SURGERY    . TONSILLECTOMY     Per [facility]   . WRIST SURGERY      Allergies  Allergen Reactions  . Chlorpheniramine Anaphylaxis  . Codeine Anaphylaxis  . Nystatin Rash    redness  . Amlodipine     Per [facility]     Outpatient Encounter Medications as of 07/10/2020  Medication Sig  . acetaminophen (TYLENOL) 500 MG tablet Take 500 mg by mouth every 8 (eight) hours as needed.   Marland Kitchen aspirin EC 81 MG tablet Take 81 mg by mouth daily.  . Carboxymethylcellulose Sodium (ARTIFICIAL TEARS OP) Apply 1 drop to eye as needed.  . Chlorhexidine Gluconate 4 % SOLN Apply 1 application topically daily. On Tuesday or Friday  . cholecalciferol (VITAMIN D) 25 MCG (1000 UNIT) tablet Take 3,000 Units by mouth daily. In the morning between 8-11 am  . donepezil (ARICEPT) 10 MG tablet Take 1 tablet (10 mg total) by mouth at bedtime.  . DULoxetine (CYMBALTA) 60 MG capsule Take 60 mg by mouth daily.  . furosemide (LASIX) 40 MG tablet Take 40 mg by mouth daily. And prn edema  . levothyroxine (SYNTHROID, LEVOTHROID) 25 MCG tablet Take 25 mcg by mouth daily before breakfast.   . LORazepam (ATIVAN) 0.5 MG tablet Take 1 tablet (0.5 mg total) by mouth every 6 (six) hours as needed for anxiety.  Marland Kitchen losartan (COZAAR) 100 MG tablet Take 100 mg by mouth daily.  . miconazole (MICOTIN) 2 % cream Apply 1 application topically as needed (Apply to rash under breasts and abdominal folds as needed).  . mineral oil-hydrophilic  petrolatum (AQUAPHOR) ointment Apply topically as needed for dry skin.  . Multiple Vitamins-Minerals (PRESERVISION AREDS 2+MULTI VIT PO) Take by mouth.  . nebivolol (BYSTOLIC) 10 MG tablet Take 1 tablet (10 mg total) by mouth daily.  . nitroGLYCERIN (NITROSTAT) 0.4 MG SL tablet Place 0.4 mg under the tongue every 5 (five) minutes as needed.  . potassium chloride SA (K-DUR,KLOR-CON) 20 MEQ tablet Take 20 mEq by mouth daily.   . sodium fluoride (PREVIDENT 5000 PLUS) 1.1 % CREA dental cream Place 1 application onto teeth every evening.  . vitamin B-12 (CYANOCOBALAMIN) 1000 MCG tablet Take 1,000 mcg by mouth daily.  . [DISCONTINUED] Cholecalciferol (VITAMIN D3) 2000 UNITS TABS Take 3,000 Units by mouth daily.    No facility-administered encounter medications on file as of 07/10/2020.    Review of Systems  Constitutional: Positive for activity change. Negative for appetite change, chills, diaphoresis, fatigue and unexpected weight change.  Respiratory: Negative for cough,  choking and shortness of breath.   Cardiovascular: Positive for leg swelling. Negative for chest pain and palpitations.  Musculoskeletal: Positive for gait problem. Negative for arthralgias, back pain, joint swelling, myalgias, neck pain and neck stiffness.  Neurological: Positive for tremors and weakness (both legs). Negative for dizziness, seizures, syncope, facial asymmetry, speech difficulty, light-headedness, numbness and headaches.  Psychiatric/Behavioral: Positive for confusion, dysphoric mood and sleep disturbance. Negative for agitation, behavioral problems, decreased concentration, hallucinations, self-injury and suicidal ideas. The patient is nervous/anxious. The patient is not hyperactive.     Immunization History  Administered Date(s) Administered  . Influenza, High Dose Seasonal PF 02/18/2017, 03/22/2019, 03/14/2020  . Influenza,inj,Quad PF,6+ Mos 03/24/2018  . Influenza-Unspecified 03/04/2016, 03/18/2020  .  Moderna SARS-COV2 Booster Vaccination 04/03/2020  . Moderna Sars-Covid-2 Vaccination 06/04/2019, 07/03/2019  . Pneumococcal Conjugate-13 05/08/2014  . Pneumococcal Polysaccharide-23 10/08/2017  . Td 07/09/2002, 01/16/2013  . Zoster 06/30/2010  . Zoster Recombinat (Shingrix) 11/18/2017, 01/17/2018   Pertinent  Health Maintenance Due  Topic Date Due  . INFLUENZA VACCINE  Completed  . DEXA SCAN  Completed  . PNA vac Low Risk Adult  Completed   Fall Risk  07/04/2019 12/21/2018 10/18/2018 06/14/2018 02/08/2018  Falls in the past year? 0 0 0 0 No  Number falls in past yr: 0 0 0 0 -  Injury with Fall? 0 0 0 0 -  Risk Factor Category  - - - - -  Risk for fall due to : - Impaired mobility - - -  Follow up - Falls evaluation completed;Falls prevention discussed - - -   Functional Status Survey:    Vitals:   07/10/20 0921  BP: (!) 158/80  Pulse: 67  Resp: 17  Temp: 97.8 F (36.6 C)  SpO2: 97%  Weight: 136 lb 3.2 oz (61.8 kg)   Body mass index is 21.98 kg/m. Physical Exam Vitals and nursing note reviewed.  Constitutional:      General: She is not in acute distress.    Appearance: She is not diaphoretic.  HENT:     Head: Normocephalic and atraumatic.  Neck:     Vascular: No JVD.  Cardiovascular:     Rate and Rhythm: Normal rate and regular rhythm.     Heart sounds: No murmur heard.   Pulmonary:     Effort: Pulmonary effort is normal. No respiratory distress.     Breath sounds: Normal breath sounds. No wheezing.  Skin:    General: Skin is warm and dry.  Neurological:     General: No focal deficit present.     Mental Status: She is alert. Mental status is at baseline.     Cranial Nerves: No cranial nerve deficit.     Motor: Weakness (BLE) present.     Comments: Tremor present at rest and with intention which is very mild in the left hand. No rigidity to BUE and BLE. NO bradykinesia or masked facies.   Psychiatric:     Comments: Tearful, flat     Labs reviewed: Recent  Labs    10/18/19 0000  NA 139  K 3.9  CL 102  CO2 24*  BUN 13  CREATININE 0.8  CALCIUM 9.2   Recent Labs    10/18/19 0000  AST 17  ALT 9  ALKPHOS 103  ALBUMIN 3.7   Recent Labs    10/18/19 0000  WBC 6.9  NEUTROABS 3  HGB 11.6*  HCT 35*  PLT 232   Lab Results  Component Value Date  TSH 3.77 10/18/2019   Lab Results  Component Value Date   HGBA1C 6.1 03/01/2019   Lab Results  Component Value Date   CHOL 200 10/18/2019   HDL 44 10/18/2019   LDLCALC 129 10/18/2019   TRIG 137 10/18/2019    Significant Diagnostic Results in last 30 days:  No results found.  Assessment/Plan  1. Grief reaction Due to loss of her husband.  If no improvement over the next month, consider additional meds.   2. Adjustment insomnia Schedule Ativan 0.25 mg qhs for sleep and anxiety.   3. Tremor Likely an essential tremor Recommend reduced intake of caffeine and ETOH. No bradykinesia, rigidity or other signs of PD.  Staff to report if not improving or worsening.   4. Weakness Wants to ambulate more and gain strength/help with knee pain PT eval and treat.   Family/ staff Communication: resident and her daughter in law were at the bedside.   Labs/tests ordered:  NA

## 2020-07-11 ENCOUNTER — Non-Acute Institutional Stay (SKILLED_NURSING_FACILITY): Payer: Medicare Other | Admitting: Adult Health

## 2020-07-11 ENCOUNTER — Encounter: Payer: Self-pay | Admitting: Adult Health

## 2020-07-11 DIAGNOSIS — B309 Viral conjunctivitis, unspecified: Secondary | ICD-10-CM | POA: Diagnosis not present

## 2020-07-11 NOTE — Progress Notes (Signed)
Location:  Occupational psychologist of Service:  SNF (31) Provider:  Cindi Carbon, ANP [place] [phone]   Gayland Curry, DO  Patient Care Team: Gayland Curry, DO as PCP - General (Geriatric Medicine) Sueanne Margarita, MD as PCP - Sleep Medicine (Sleep Medicine) Evans Lance, MD as PCP - Cardiology (Cardiology) Justice Britain, MD as Consulting Physician (Orthopedic Surgery) Gaynelle Arabian, MD as Consulting Physician (Orthopedic Surgery) Garvin Fila, MD as Consulting Physician (Neurology) Evans Lance, MD as Consulting Physician (Cardiology) Jettie Booze, MD as Consulting Physician (Interventional Cardiology) Shon Hough, MD as Consulting Physician (Ophthalmology) Community, [facility] ([place]) Royal Hawthorn, NP as Nurse Practitioner (Nurse Practitioner)  Extended Emergency Contact Information Primary Emergency Contact: Stem[place],JED Address: [place], [place] of [place] Phone: [phone] Mobile Phone: [phone] Relation: Son Secondary Emergency Contact: [place] Mobile Phone: [phone] Relation: Daughter  Code Status:  DNR Goals of care: Advanced Directive information Advanced Directives 07/10/2020  Does Patient Have a Medical Advance Directive? Yes  Type of Paramedic of [place];Out of facility DNR (pink MOST or yellow form)  Does patient want to make changes to medical advance directive? No - Patient declined  Copy of [place] in Chart? Yes - validated most recent copy scanned in chart (See row information)  Pre-existing out of facility DNR order (yellow form or pink MOST form) Yellow form placed in chart (order not valid for inpatient use)     Chief Complaint  Patient presents with  . Acute Visit    Eye redness    HPI:  Pt is a 87 y.o. female seen today for an acute visit for  eye redness CNA brought Ms. Battle to the front desk to report that her right eye lid is red and swollen. It itches. There is no fever purulent drainage. No change in vision.    Past Medical History:  Diagnosis Date  . Breast cancer ([place])   . Colon polyps   . Coronary artery disease 06/2006   STEMI inferior with BMS to mid RCA with mid basal inferior wall HK and luminal irrgularities elsewhere  . Fracture of femoral neck, right ([place]) 06/13/2011  . Fracture of humeral head, right, closed 06/13/2011  . GERD (gastroesophageal reflux disease)   . Hemorrhoids   . Hyperlipemia   . Hypertension   . LBBB (left bundle branch block)   . Macular degeneration   . Myocardial infarction ([place])   . Obstructive sleep apnea on CPAP   . Osteopenia   . Osteopenia   . Pacemaker 02/25/14  . Rhinitis   . Rotator cuff syndrome   . SAH (subarachnoid hemorrhage) ([place]) 02/22/14  . 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 Ulnar neuropathy   . Umbilical hernia   . Unstable gait    Past Surgical History:  Procedure Laterality Date  . BREAST LUMPECTOMY    . CATARACT EXTRACTION, BILATERAL     Per [facility]  . CORONARY ANGIOPLASTY WITH STENT PLACEMENT  06/2006   Mid RCA BMS  . HIP ARTHROPLASTY  06/06/2011   Procedure: ARTHROPLASTY BIPOLAR HIP;  Surgeon: Gearlean Alf;  [facility]: [facility];  Service: Orthopedics;  Laterality: Right;  . PERMANENT PACEMAKER INSERTION N/A 02/25/2014   Procedure: PERMANENT PACEMAKER INSERTION;  Surgeon: Evans Lance, MD;  Location: [facility] CATH LAB;  Service: Cardiovascular;  Laterality: N/A;  .  SHOULDER SURGERY    . TONSILLECTOMY     Per [facility]   . WRIST SURGERY      Allergies  Allergen Reactions  . Chlorpheniramine Anaphylaxis  . Codeine Anaphylaxis  . Nystatin Rash    redness  . Amlodipine     Per [facility]     Outpatient Encounter Medications as of 07/11/2020  Medication Sig  . acetaminophen (TYLENOL) 500 MG tablet Take 500 mg by mouth  every 8 (eight) hours as needed.   Marland Kitchen aspirin EC 81 MG tablet Take 81 mg by mouth daily.  . Carboxymethylcellulose Sodium (ARTIFICIAL TEARS OP) Apply 1 drop to eye as needed.  . Chlorhexidine Gluconate 4 % SOLN Apply 1 application topically daily. On Tuesday or Friday  . cholecalciferol (VITAMIN D) 25 MCG (1000 UNIT) tablet Take 3,000 Units by mouth daily. In the morning between 8-11 am  . donepezil (ARICEPT) 10 MG tablet Take 1 tablet (10 mg total) by mouth at bedtime.  . DULoxetine (CYMBALTA) 60 MG capsule Take 60 mg by mouth daily.  . furosemide (LASIX) 40 MG tablet Take 40 mg by mouth daily. And prn edema  . levothyroxine (SYNTHROID, LEVOTHROID) 25 MCG tablet Take 25 mcg by mouth daily before breakfast.   . LORazepam (ATIVAN) 0.5 MG tablet Take 1 tablet (0.5 mg total) by mouth every 6 (six) hours as needed for anxiety. (Patient taking differently: Take 0.25 mg by mouth every 6 (six) hours as needed for anxiety.)  . LORazepam (ATIVAN) 0.5 MG tablet Take 0.5 tablets (0.25 mg total) by mouth at bedtime.  Marland Kitchen losartan (COZAAR) 100 MG tablet Take 100 mg by mouth daily.  . miconazole (MICOTIN) 2 % cream Apply 1 application topically as needed (Apply to rash under breasts and abdominal folds as needed).  . mineral oil-hydrophilic petrolatum (AQUAPHOR) ointment Apply topically as needed for dry skin.  . Multiple Vitamins-Minerals (PRESERVISION AREDS 2+MULTI VIT PO) Take by mouth.  . nebivolol (BYSTOLIC) 10 MG tablet Take 1 tablet (10 mg total) by mouth daily.  . nitroGLYCERIN (NITROSTAT) 0.4 MG SL tablet Place 0.4 mg under the tongue every 5 (five) minutes as needed.  . potassium chloride SA (K-DUR,KLOR-CON) 20 MEQ tablet Take 20 mEq by mouth daily.   . sodium fluoride (PREVIDENT 5000 PLUS) 1.1 % CREA dental cream Place 1 application onto teeth every evening.  . vitamin B-12 (CYANOCOBALAMIN) 1000 MCG tablet Take 1,000 mcg by mouth daily.   No facility-administered encounter medications on file as of  07/11/2020.    Review of Systems  Eyes: Positive for redness and itching. Negative for photophobia, pain, discharge and visual disturbance.    Immunization History  Administered Date(s) Administered  . Influenza, High Dose Seasonal PF 02/18/2017, 03/22/2019, 03/14/2020  . Influenza,inj,Quad PF,6+ Mos 03/24/2018  . Influenza-Unspecified 03/04/2016, 03/18/2020  . Moderna SARS-COV2 Booster Vaccination 04/03/2020  . Moderna Sars-Covid-2 Vaccination 06/04/2019, 07/03/2019  . Pneumococcal Conjugate-13 05/08/2014  . Pneumococcal Polysaccharide-23 10/08/2017  . Td 07/09/2002, 01/16/2013  . Zoster 06/30/2010  . Zoster Recombinat (Shingrix) 11/18/2017, 01/17/2018   Pertinent  Health Maintenance Due  Topic Date Due  . INFLUENZA VACCINE  Completed  . DEXA SCAN  Completed  . PNA vac Low Risk Adult  Completed   Fall Risk  07/04/2019 12/21/2018 10/18/2018 06/14/2018 02/08/2018  Falls in the past year? 0 0 0 0 No  Number falls in past yr: 0 0 0 0 -  Injury with Fall? 0 0 0 0 -  Risk Factor Category  - - - - -  Risk for fall due to : - Impaired mobility - - -  Follow up - Falls evaluation completed;Falls prevention discussed - - -   Functional Status Survey:    There were no vitals filed for this visit. There is no height or weight on file to calculate BMI. Physical Exam Vitals and nursing note reviewed.  Constitutional:      Appearance: Normal appearance.  Eyes:     General:        Right eye: No discharge.        Left eye: No discharge.     Extraocular Movements: Extraocular movements intact.     Pupils: Pupils are equal, round, and reactive to light.     Comments: Right upper and lower lid with mild erythema and mild swelling. NO erythema to the sclera. Mild erythema to the right conjunctiva. No purulent drainage or tenderness.   Neurological:     Mental Status: She is alert.     Labs reviewed: Recent Labs    10/18/19 0000  NA 139  K 3.9  CL 102  CO2 24*  BUN 13  CREATININE  0.8  CALCIUM 9.2   Recent Labs    10/18/19 0000  AST 17  ALT 9  ALKPHOS 103  ALBUMIN 3.7   Recent Labs    10/18/19 0000  WBC 6.9  NEUTROABS 3  HGB 11.6*  HCT 35*  PLT 232   Lab Results  Component Value Date   TSH 3.77 10/18/2019   Lab Results  Component Value Date   HGBA1C 6.1 03/01/2019   Lab Results  Component Value Date   CHOL 200 10/18/2019   HDL 44 10/18/2019   LDLCALC 129 10/18/2019   TRIG 137 10/18/2019    Significant Diagnostic Results in last 30 days:  No results found.  Assessment/Plan  1. Viral conjunctivitis of right eye Vs early chalazion No purulent drainage or visual issues Warm compresses tid x 72 hrs Report of worsening or not improving. Keep hands away from the eye and clean    Family/ staff Communication:   Labs/tests ordered:  NA

## 2020-07-14 ENCOUNTER — Encounter: Payer: Self-pay | Admitting: Internal Medicine

## 2020-07-24 ENCOUNTER — Encounter: Payer: Self-pay | Admitting: Adult Health

## 2020-07-24 ENCOUNTER — Non-Acute Institutional Stay (SKILLED_NURSING_FACILITY): Payer: Medicare Other | Admitting: Adult Health

## 2020-07-24 DIAGNOSIS — E039 Hypothyroidism, unspecified: Secondary | ICD-10-CM | POA: Diagnosis not present

## 2020-07-24 DIAGNOSIS — R2681 Unsteadiness on feet: Secondary | ICD-10-CM

## 2020-07-24 DIAGNOSIS — F0150 Vascular dementia without behavioral disturbance: Secondary | ICD-10-CM

## 2020-07-24 DIAGNOSIS — I872 Venous insufficiency (chronic) (peripheral): Secondary | ICD-10-CM | POA: Diagnosis not present

## 2020-07-24 DIAGNOSIS — G250 Essential tremor: Secondary | ICD-10-CM

## 2020-07-24 DIAGNOSIS — F4321 Adjustment disorder with depressed mood: Secondary | ICD-10-CM | POA: Diagnosis not present

## 2020-07-24 DIAGNOSIS — I10 Essential (primary) hypertension: Secondary | ICD-10-CM | POA: Diagnosis not present

## 2020-07-24 NOTE — Progress Notes (Signed)
Location:   [facility] Room Number: 125-A Place of Service:  SNF (31) Provider:  Royal Hawthorn, NP    Patient Care Team: Gayland Curry, DO as PCP - General (Geriatric Medicine) Sueanne Margarita, MD as PCP - Sleep Medicine (Sleep Medicine) Evans Lance, MD as PCP - Cardiology (Cardiology) Justice Britain, MD as Consulting Physician (Orthopedic Surgery) Gaynelle Arabian, MD as Consulting Physician (Orthopedic Surgery) Garvin Fila, MD as Consulting Physician (Neurology) Evans Lance, MD as Consulting Physician (Cardiology) Jettie Booze, MD as Consulting Physician (Interventional Cardiology) Shon Hough, MD as Consulting Physician (Ophthalmology) Community, [facility] ([place]) Royal Hawthorn, NP as Nurse Practitioner (Nurse Practitioner)  Extended Emergency Contact Information Primary Emergency Contact: Doig,JED Address: [place], [place] Phone: [phone] Mobile Phone: [phone] Relation: Son Secondary Emergency Contact: Severn Mobile Phone: [phone] Relation: Daughter  Code Status:  DNR Goals of care: Advanced Directive information Advanced Directives 07/24/2020  Does Patient Have a Medical Advance Directive? Yes  Type of Paramedic of [place];Out of facility DNR (pink MOST or yellow form)  Does patient want to make changes to medical advance directive? No - Patient declined  Copy of [place] in Chart? Yes - validated most recent copy scanned in chart (See row information)  Pre-existing out of facility DNR order (yellow form or pink MOST form) -     Chief Complaint  Patient presents with  . Acute Visit    Tremor  . Medical Management of Chronic Issues    HPI:  Pt is a 87 y.o. female seen today for an acute visit for tremor and medical management of chronic diseases.    She lost her husband last month and continues to grieve. Her nurse states her mood is like a roller coaster. They are trying to encourage her to participate in activities. She is taking ativan at night to help with sleep and anxiety. It seems to be helping. She has dementia and at times forgets that her husband has passed and asks where he is located. She reports to me she feels guilty that maybe more could have been done if she had noticed something sooner. She discusses many wonderful memories she has with her husband. 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 Also wonders if she can move away somewhere else now that her husband has passed. Seems unaware of her limitations.   She has a mild tremor in her left hand that is not affecting her ADLs. Its present at rest and with intention. No rigidity or bradykinesia.   She is not walking due to pain in her knees and weight gain. PT ordered to help progress from the [facility] back to a walking program.    Past Medical History:  Diagnosis Date  . Breast cancer ([place])   . Colon polyps   . Coronary artery disease 06/2006   STEMI inferior with BMS to mid RCA with mid basal inferior wall HK and luminal irrgularities elsewhere  . Fracture of femoral neck, right ([place]) 06/13/2011  . Fracture of humeral head, right, closed 06/13/2011  . GERD (gastroesophageal reflux disease)   . Hemorrhoids   . Hyperlipemia   . Hypertension   . LBBB (left bundle branch block)   . Macular degeneration   . Myocardial infarction ([place])   . Obstructive sleep apnea on CPAP   .  Osteopenia   . Osteopenia   . Pacemaker 02/25/14  . Rhinitis   . Rotator cuff syndrome   . SAH (subarachnoid hemorrhage) ([place]) 02/22/14  . 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 Ulnar neuropathy   . Umbilical hernia   . Unstable gait    Past Surgical History:  Procedure Laterality Date  . BREAST LUMPECTOMY    . CATARACT EXTRACTION, BILATERAL     Per [facility]  . CORONARY  ANGIOPLASTY WITH STENT PLACEMENT  06/2006   Mid RCA BMS  . HIP ARTHROPLASTY  06/06/2011   Procedure: ARTHROPLASTY BIPOLAR HIP;  Surgeon: Gearlean Alf;  Location: [facility]S;  Service: Orthopedics;  Laterality: Right;  . PERMANENT PACEMAKER INSERTION N/A 02/25/2014   Procedure: PERMANENT PACEMAKER INSERTION;  Surgeon: Evans Lance, MD;  Location: [facility] CATH LAB;  Service: Cardiovascular;  Laterality: N/A;  . SHOULDER SURGERY    . TONSILLECTOMY     Per [facility]   . WRIST SURGERY      Allergies  Allergen Reactions  . Chlorpheniramine Anaphylaxis  . Codeine Anaphylaxis  . Nystatin Rash    redness  . Amlodipine     Per [facility]     Allergies as of 07/24/2020      Reactions   Chlorpheniramine Anaphylaxis   Codeine Anaphylaxis   Nystatin Rash   redness   Amlodipine    Per [facility]       Medication List       Accurate as of July 24, 2020  1:43 PM. If you have any questions, ask your nurse or doctor.        acetaminophen 500 MG tablet Commonly known as: TYLENOL Take 500 mg by mouth every 8 (eight) hours as needed.   ARTIFICIAL TEARS OP Apply 1 drop to eye as needed.   aspirin EC 81 MG tablet Take 81 mg by mouth daily.   Chlorhexidine Gluconate 4 % Soln Apply 1 application topically daily. On Tuesday or Friday   cholecalciferol 25 MCG (1000 UNIT) tablet Commonly known as: VITAMIN D Take 3,000 Units by mouth daily. In the morning between 8-11 am   donepezil 10 MG tablet Commonly known as: Aricept Take 1 tablet (10 mg total) by mouth at bedtime.   DULoxetine 60 MG capsule Commonly known as: CYMBALTA Take 60 mg by mouth daily.   furosemide 40 MG tablet Commonly known as: LASIX Take 40 mg by mouth daily. And prn edema   levothyroxine 25 MCG tablet Commonly known as: SYNTHROID Take 25 mcg by mouth daily before breakfast.   LORazepam 0.5 MG tablet Commonly known as: ATIVAN Take 1 tablet (0.5 mg total) by mouth every 6  (six) hours as needed for anxiety. What changed:   how much to take  when to take this  additional instructions  Another medication with the same name was removed. Continue taking this medication, and follow the directions you see here.   losartan 100 MG tablet Commonly known as: COZAAR Take 100 mg by mouth daily.   miconazole 2 % cream Commonly known as: MICOTIN Apply 1 application topically as needed (Apply to rash under breasts and abdominal folds as needed).   mineral oil-hydrophilic petrolatum ointment Apply topically as needed for dry skin.   nebivolol 10 MG tablet Commonly known as: Bystolic Take 1 tablet (10 mg total) by mouth daily.   nitroGLYCERIN 0.4 MG SL tablet Commonly known as: NITROSTAT Place 0.4 mg under the tongue every 5 (five)  minutes as needed.   potassium chloride SA 20 MEQ tablet Commonly known as: KLOR-CON Take 20 mEq by mouth daily.   PRESERVISION AREDS 2+MULTI VIT PO Take by mouth.   sodium fluoride 1.1 % Crea dental cream Commonly known as: PREVIDENT 5000 PLUS Place 1 application onto teeth every evening.   vitamin B-12 1000 MCG tablet Commonly known as: CYANOCOBALAMIN Take 1,000 mcg by mouth daily.       Review of Systems  Constitutional: Positive for activity change. Negative for appetite change, chills, diaphoresis, fatigue, fever and unexpected weight change.  HENT: Negative for congestion.   Respiratory: Negative for cough, shortness of breath and wheezing.   Cardiovascular: Positive for leg swelling. Negative for chest pain and palpitations.  Gastrointestinal: Negative for abdominal distention, abdominal pain, constipation and diarrhea.  Genitourinary: Negative for difficulty urinating and dysuria.  Musculoskeletal: Positive for arthralgias and gait problem. Negative for back pain, joint swelling and myalgias.  Skin: Negative for color change, pallor, rash and wound.  Neurological: Positive for tremors. Negative for dizziness,  seizures, syncope, facial asymmetry, speech difficulty, weakness, light-headedness, numbness and headaches.  Psychiatric/Behavioral: Positive for confusion and dysphoric mood. Negative for agitation, behavioral problems, self-injury, sleep disturbance and suicidal ideas. The patient is nervous/anxious.     Immunization History  Administered Date(s) Administered  . Influenza, High Dose Seasonal PF 02/18/2017, 03/22/2019, 03/14/2020  . Influenza,inj,Quad PF,6+ Mos 03/24/2018  . Influenza-Unspecified 03/04/2016, 03/18/2020  . Moderna SARS-COV2 Booster Vaccination 04/03/2020  . Moderna Sars-Covid-2 Vaccination 06/04/2019, 07/03/2019  . Pneumococcal Conjugate-13 05/08/2014  . Pneumococcal Polysaccharide-23 10/08/2017  . Td 07/09/2002, 01/16/2013  . Zoster 06/30/2010  . Zoster Recombinat (Shingrix) 11/18/2017, 01/17/2018   Pertinent  Health Maintenance Due  Topic Date Due  . INFLUENZA VACCINE  Completed  . DEXA SCAN  Completed  . PNA vac Low Risk Adult  Completed   Fall Risk  07/04/2019 12/21/2018 10/18/2018 06/14/2018 02/08/2018  Falls in the past year? 0 0 0 0 No  Number falls in past yr: 0 0 0 0 -  Injury with Fall? 0 0 0 0 -  Risk Factor Category  - - - - -  Risk for fall due to : - Impaired mobility - - -  Follow up - Falls evaluation completed;Falls prevention discussed - - -   Functional Status Survey:    Vitals:   07/24/20 0956  BP: (!) 158/80  Pulse: 67  Resp: 17  Temp: 97.8 F (36.6 C)  SpO2: 97%  Weight: 240 lb 14.4 oz (109.3 kg)  Height: 5\' 6"  (1.676 m)   Body mass index is 38.88 kg/m. Physical Exam Vitals and nursing note reviewed.  Constitutional:      General: She is not in acute distress.    Appearance: She is not diaphoretic.  HENT:     Head: Normocephalic and atraumatic.  Neck:     Vascular: No JVD.  Cardiovascular:     Rate and Rhythm: Normal rate and regular rhythm.     Heart sounds: No murmur heard.   Pulmonary:     Effort: Pulmonary effort is  normal. No respiratory distress.     Breath sounds: Normal breath sounds. No wheezing.  Abdominal:     General: Bowel sounds are normal. There is no distension.     Palpations: Abdomen is soft.     Tenderness: There is no abdominal tenderness.  Musculoskeletal:     Right lower leg: Edema (+1) present.     Left lower leg: Edema (+1) present.  Skin:    General: Skin is warm and dry.  Neurological:     General: No focal deficit present.     Mental Status: She is alert. Mental status is at baseline.     Comments: Tremor noted to the left hand at rest and when trying to hold a cup  Psychiatric:     Comments: Slightly depressed but improved from the last visit.      Labs reviewed: Recent Labs    10/18/19 0000  NA 139  K 3.9  CL 102  CO2 24*  BUN 13  CREATININE 0.8  CALCIUM 9.2   Recent Labs    10/18/19 0000  AST 17  ALT 9  ALKPHOS 103  ALBUMIN 3.7   Recent Labs    10/18/19 0000  WBC 6.9  NEUTROABS 3  HGB 11.6*  HCT 35*  PLT 232   Lab Results  Component Value Date   TSH 3.77 10/18/2019   Lab Results  Component Value Date   HGBA1C 6.1 03/01/2019   Lab Results  Component Value Date   CHOL 200 10/18/2019   HDL 44 10/18/2019   LDLCALC 129 10/18/2019   TRIG 137 10/18/2019    Significant Diagnostic Results in last 30 days:  No results found.  Assessment/Plan  1. Essential tremor Mild and slightly more noticeable due to anxiety.  No red flags at this pt Continue to monitor and if this worsens and affects her ADLs will address  2. Acquired hypothyroidism Lab Results  Component Value Date   TSH 3.77 10/18/2019   Continue Synthroid 25 mcg qd   3. Vascular dementia without behavioral disturbance ([place]) Requires skilled care due to functional losses, short term memory loss, and incontinence Continue Aricept 10 mg qd   4. Primary hypertension Slightly above goal, possibly due to anxiety Continue Bystolic 10 mg qd   5. Grief reaction Continues with  periods of anxiety and grief but has improved slightly  Recommend continuing the ativan for now to help her through this stage  6. Venous insufficiency Continue lasix 40 mg qd and compression hose   7. Unsteady gait Due to weakness, knee problems, dementia, weight gain PT eval pending   Family/ staff Communication: nurse and resident   Labs/tests ordered:  Labs due in May

## 2020-08-12 ENCOUNTER — Ambulatory Visit (INDEPENDENT_AMBULATORY_CARE_PROVIDER_SITE_OTHER): Payer: Medicare Other

## 2020-08-12 DIAGNOSIS — I4430 Unspecified atrioventricular block: Secondary | ICD-10-CM

## 2020-08-12 LAB — CUP PACEART REMOTE DEVICE CHECK
Battery Remaining Longevity: 104 mo
Battery Remaining Percentage: 89 %
Battery Voltage: 2.95 V
Brady Statistic AP VP Percent: 72 %
Brady Statistic AP VS Percent: 1 %
Brady Statistic AS VP Percent: 26 %
Brady Statistic AS VS Percent: 1 %
Brady Statistic RA Percent Paced: 71 %
Brady Statistic RV Percent Paced: 98 %
Date Time Interrogation Session: 20220321074414
Implantable Lead Implant Date: 20151005
Implantable Lead Implant Date: 20151005
Implantable Lead Location: 753859
Implantable Lead Location: 753860
Implantable Pulse Generator Implant Date: 20151005
Lead Channel Impedance Value: 450 Ohm
Lead Channel Impedance Value: 610 Ohm
Lead Channel Pacing Threshold Amplitude: 0.75 V
Lead Channel Pacing Threshold Amplitude: 1 V
Lead Channel Pacing Threshold Pulse Width: 0.5 ms
Lead Channel Pacing Threshold Pulse Width: 0.5 ms
Lead Channel Sensing Intrinsic Amplitude: 12 mV
Lead Channel Sensing Intrinsic Amplitude: 3.6 mV
Lead Channel Setting Pacing Amplitude: 1 V
Lead Channel Setting Pacing Amplitude: 2 V
Lead Channel Setting Pacing Pulse Width: 0.5 ms
Lead Channel Setting Sensing Sensitivity: 4 mV
Pulse Gen Model: 2240
Pulse Gen Serial Number: 7666288

## 2020-08-20 NOTE — Progress Notes (Signed)
Remote pacemaker transmission.   

## 2020-08-26 ENCOUNTER — Non-Acute Institutional Stay (SKILLED_NURSING_FACILITY): Payer: Medicare Other | Admitting: Orthopedic Surgery

## 2020-08-26 ENCOUNTER — Encounter: Payer: Self-pay | Admitting: Orthopedic Surgery

## 2020-08-26 DIAGNOSIS — M25562 Pain in left knee: Secondary | ICD-10-CM | POA: Diagnosis not present

## 2020-08-26 DIAGNOSIS — G8929 Other chronic pain: Secondary | ICD-10-CM

## 2020-08-26 DIAGNOSIS — F0150 Vascular dementia without behavioral disturbance: Secondary | ICD-10-CM | POA: Diagnosis not present

## 2020-08-26 DIAGNOSIS — I10 Essential (primary) hypertension: Secondary | ICD-10-CM | POA: Diagnosis not present

## 2020-08-26 DIAGNOSIS — F4321 Adjustment disorder with depressed mood: Secondary | ICD-10-CM

## 2020-08-26 DIAGNOSIS — I872 Venous insufficiency (chronic) (peripheral): Secondary | ICD-10-CM

## 2020-08-26 DIAGNOSIS — M25561 Pain in right knee: Secondary | ICD-10-CM

## 2020-08-26 DIAGNOSIS — R2681 Unsteadiness on feet: Secondary | ICD-10-CM

## 2020-08-26 DIAGNOSIS — E039 Hypothyroidism, unspecified: Secondary | ICD-10-CM | POA: Diagnosis not present

## 2020-08-26 DIAGNOSIS — E669 Obesity, unspecified: Secondary | ICD-10-CM

## 2020-08-26 NOTE — Progress Notes (Signed)
Location:   [facility] Room Number: 125-A Place of Service:  SNF (31) Provider:   Windell Moulding, NP    Patient Care Team: Gayland Curry, DO as PCP - General (Geriatric Medicine) Sueanne Margarita, MD as PCP - Sleep Medicine (Sleep Medicine) Evans Lance, MD as PCP - Cardiology (Cardiology) Justice Britain, MD as Consulting Physician (Orthopedic Surgery) Gaynelle Arabian, MD as Consulting Physician (Orthopedic Surgery) Garvin Fila, MD as Consulting Physician (Neurology) Evans Lance, MD as Consulting Physician (Cardiology) Jettie Booze, MD as Consulting Physician (Interventional Cardiology) Shon Hough, MD as Consulting Physician (Ophthalmology) Community, [facility] ([place]) Royal Hawthorn, NP as Nurse Practitioner (Nurse Practitioner)  Extended Emergency Contact Information Primary Emergency Contact: Stenerson,JED Address: [place], [place] Phone: [phone] Mobile Phone: [phone] Relation: Son Secondary Emergency Contact: [place] Mobile Phone: [phone] Relation: Daughter  Code Status:  DNR Goals of care: Advanced Directive information Advanced Directives 08/26/2020  Does Patient Have a Medical Advance Directive? Yes  Type of Paramedic of [place];Out of facility DNR (pink MOST or yellow form)  Does patient want to make changes to medical advance directive? No - Patient declined  Copy of [place] in Chart? Yes - validated most recent copy scanned in chart (See row information)  Pre-existing out of facility DNR order (yellow form or pink MOST form) -     Chief Complaint  Patient presents with  . Medical Management of Chronic Issues    Routine Visit.    HPI:  Pt is a 87 y.o. female seen today for medical management of chronic diseases. Today she is sitting in her room,  transferring from wheelchair to recliner. Continues to suffer from bilateral knee pain. Worse in the morning. Rated 4/10. Improves throughout the day with movement. Last month, PT was ordered and discontinued by son. He would like [facility]  to encourage her to do more physical activities. No recent falls or injuries.   She continues to grieve her husband who passed about 2 months prior. Today, she mentions him and became tearful. Reports they met when they were children. Due to her dementia, she has forgotten about his passing at times. Continues to take prn ativan at night, last dose given 04/04. No recent behavioral outbursts.   Next pacer check June 21.   Recent weight trends are as follows:  04/05- 247 lbs  03/06- 244.2 lbs  02/06- 136.2 lbs    Past Medical History:  Diagnosis Date  . Breast cancer ([place])   . Colon polyps   . Coronary artery disease 06/2006   STEMI inferior with BMS to mid RCA with mid basal inferior wall HK and luminal irrgularities elsewhere  . Fracture of femoral neck, right ([place]) 06/13/2011  . Fracture of humeral head, right, closed 06/13/2011  . GERD (gastroesophageal reflux disease)   . Hemorrhoids   . Hyperlipemia   . Hypertension   . LBBB (left bundle branch block)   . Macular degeneration   . Myocardial infarction ([place])   . Obstructive sleep apnea on CPAP   . Osteopenia   . Osteopenia   . Pacemaker 02/25/14  . Rhinitis   . Rotator cuff syndrome   . SAH (subarachnoid hemorrhage) ([place]) 02/22/14  . 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 Ulnar neuropathy   . Umbilical hernia   . Unstable gait  Past Surgical History:  Procedure Laterality Date  . BREAST LUMPECTOMY    . CATARACT EXTRACTION, BILATERAL     Per [facility]  . CORONARY ANGIOPLASTY WITH STENT PLACEMENT  06/2006   Mid RCA BMS  . HIP ARTHROPLASTY  06/06/2011   Procedure: ARTHROPLASTY BIPOLAR HIP;  Surgeon: Gearlean Alf;  Location: [facility]S;  Service: Orthopedics;  Laterality: Right;   . PERMANENT PACEMAKER INSERTION N/A 02/25/2014   Procedure: PERMANENT PACEMAKER INSERTION;  Surgeon: Evans Lance, MD;  Location: [facility] CATH LAB;  Service: Cardiovascular;  Laterality: N/A;  . SHOULDER SURGERY    . TONSILLECTOMY     Per [facility]   . WRIST SURGERY      Allergies  Allergen Reactions  . Chlorpheniramine Anaphylaxis  . Codeine Anaphylaxis  . Nystatin Rash    redness  . Amlodipine     Per [facility]     Allergies as of 08/26/2020      Reactions   Chlorpheniramine Anaphylaxis   Codeine Anaphylaxis   Nystatin Rash   redness   Amlodipine    Per [facility]       Medication List       Accurate as of August 26, 2020  3:52 PM. If you have any questions, ask your nurse or doctor.        acetaminophen 500 MG tablet Commonly known as: TYLENOL Take 500 mg by mouth every 8 (eight) hours as needed.   ARTIFICIAL TEARS OP Apply 1 drop to eye as needed.   aspirin EC 81 MG tablet Take 81 mg by mouth daily.   Chlorhexidine Gluconate 4 % Soln Apply 1 application topically daily. On Tuesday or Friday   cholecalciferol 25 MCG (1000 UNIT) tablet Commonly known as: VITAMIN D Take 3,000 Units by mouth daily. In the morning between 8-11 am   donepezil 10 MG tablet Commonly known as: Aricept Take 1 tablet (10 mg total) by mouth at bedtime.   DULoxetine 60 MG capsule Commonly known as: CYMBALTA Take 60 mg by mouth daily.   furosemide 40 MG tablet Commonly known as: LASIX Take 40 mg by mouth daily. And prn edema   levothyroxine 25 MCG tablet Commonly known as: SYNTHROID Take 25 mcg by mouth daily before breakfast.   LORazepam 0.5 MG tablet Commonly known as: ATIVAN Take 1 tablet (0.5 mg total) by mouth every 6 (six) hours as needed for anxiety.   losartan 100 MG tablet Commonly known as: COZAAR Take 100 mg by mouth daily.   miconazole 2 % cream Commonly known as: MICOTIN Apply 1 application topically as needed  (Apply to rash under breasts and abdominal folds as needed).   mineral oil-hydrophilic petrolatum ointment Apply topically as needed for dry skin.   nebivolol 10 MG tablet Commonly known as: Bystolic Take 1 tablet (10 mg total) by mouth daily.   nitroGLYCERIN 0.4 MG SL tablet Commonly known as: NITROSTAT Place 0.4 mg under the tongue every 5 (five) minutes as needed.   potassium chloride SA 20 MEQ tablet Commonly known as: KLOR-CON Take 20 mEq by mouth daily.   PRESERVISION AREDS 2+MULTI VIT PO Take by mouth.   sodium fluoride 1.1 % Crea dental cream Commonly known as: PREVIDENT 5000 PLUS Place 1 application onto teeth every evening.   vitamin B-12 1000 MCG tablet Commonly known as: CYANOCOBALAMIN Take 1,000 mcg by mouth daily.       Review of Systems  Constitutional: Negative for activity change, appetite  change, fatigue and fever.  HENT: Positive for hearing loss. Negative for congestion and trouble swallowing.   Eyes: Negative for visual disturbance.  Respiratory: Negative for cough, shortness of breath and wheezing.   Cardiovascular: Negative for chest pain and leg swelling.  Gastrointestinal: Negative for abdominal distention, abdominal pain, constipation, diarrhea and nausea.  Genitourinary: Negative for dysuria, frequency and hematuria.  Musculoskeletal: Positive for arthralgias, gait problem and myalgias.       Bilateral knee pain  Skin:       Dry skin  Neurological: Positive for tremors and weakness. Negative for dizziness and headaches.  Psychiatric/Behavioral: Positive for confusion, dysphoric mood and sleep disturbance. Negative for suicidal ideas. The patient is nervous/anxious.     Immunization History  Administered Date(s) Administered  . Influenza, High Dose Seasonal PF 02/18/2017, 03/22/2019, 03/14/2020  . Influenza,inj,Quad PF,6+ Mos 03/24/2018  . Influenza-Unspecified 03/04/2016, 03/18/2020  . Moderna SARS-COV2 Booster Vaccination 04/03/2020  .  Moderna Sars-Covid-2 Vaccination 06/04/2019, 07/03/2019  . Pneumococcal Conjugate-13 05/08/2014  . Pneumococcal Polysaccharide-23 10/08/2017  . Td 07/09/2002, 01/16/2013  . Zoster 06/30/2010  . Zoster Recombinat (Shingrix) 11/18/2017, 01/17/2018   Pertinent  Health Maintenance Due  Topic Date Due  . INFLUENZA VACCINE  12/22/2020  . DEXA SCAN  Completed  . PNA vac Low Risk Adult  Completed   Fall Risk  07/04/2019 12/21/2018 10/18/2018 06/14/2018 02/08/2018  Falls in the past year? 0 0 0 0 No  Number falls in past yr: 0 0 0 0 -  Injury with Fall? 0 0 0 0 -  Risk Factor Category  - - - - -  Risk for fall due to : - Impaired mobility - - -  Follow up - Falls evaluation completed;Falls prevention discussed - - -   Functional Status Survey:    Vitals:   08/26/20 1544  BP: (!) 168/82  Pulse: 61  Resp: 19  Temp: (!) 97.5 F (36.4 C)  SpO2: 97%  Weight: 247 lb (112 kg)  Height: $Remove'5\' 6"'[id number]$  (1.676 m)   Body mass index is 39.87 kg/m. Physical Exam Vitals reviewed.  Constitutional:      General: She is not in acute distress. HENT:     Head: Normocephalic.     Right Ear: There is no impacted cerumen.     Left Ear: There is no impacted cerumen.     Nose: Nose normal.     Mouth/Throat:     Mouth: Mucous membranes are moist.     Pharynx: No posterior oropharyngeal erythema.  Eyes:     General:        Right eye: No discharge.        Left eye: No discharge.  Cardiovascular:     Rate and Rhythm: Normal rate and regular rhythm.     Pulses: Normal pulses.     Heart sounds: Normal heart sounds. No murmur heard.   Pulmonary:     Effort: Pulmonary effort is normal. No respiratory distress.     Breath sounds: Normal breath sounds. No wheezing.  Abdominal:     General: Bowel sounds are normal. There is no distension.     Palpations: Abdomen is soft.     Tenderness: There is no abdominal tenderness.  Musculoskeletal:     Cervical back: Normal range of motion.     Right lower leg: Edema  present.     Left lower leg: Edema present.     Comments: Non-pitting  Lymphadenopathy:     Cervical: No cervical adenopathy.  Skin:    General: Skin is warm and dry.     Capillary Refill: Capillary refill takes less than 2 seconds.  Neurological:     General: No focal deficit present.     Mental Status: She is alert. Mental status is at baseline.     Motor: Weakness present.     Gait: Gait abnormal.     Comments: Left hand tremor. Wheelchair  Psychiatric:        Mood and Affect: Mood normal. Affect is not tearful.        Behavior: Behavior normal.        Cognition and Memory: Memory is impaired.     Comments: Tearful when discussing husband     Labs reviewed: Recent Labs    10/18/19 0000  NA 139  K 3.9  CL 102  CO2 24*  BUN 13  CREATININE 0.8  CALCIUM 9.2   Recent Labs    10/18/19 0000  AST 17  ALT 9  ALKPHOS 103  ALBUMIN 3.7   Recent Labs    10/18/19 0000  WBC 6.9  NEUTROABS 3  HGB 11.6*  HCT 35*  PLT 232   Lab Results  Component Value Date   TSH 3.77 10/18/2019   Lab Results  Component Value Date   HGBA1C 6.1 03/01/2019   Lab Results  Component Value Date   CHOL 200 10/18/2019   HDL 44 10/18/2019   LDLCALC 129 10/18/2019   TRIG 137 10/18/2019    Significant Diagnostic Results in last 30 days:  CUP PACEART REMOTE DEVICE CHECK  Result Date: 08/12/2020 Scheduled remote reviewed. Normal device function.  AF burden <1%, longest 10 secs. Next remote 91 days- [facility]/CV[facility]   Assessment/Plan 1. Vascular dementia without behavioral disturbance ([place]) - no recent behavioral outbursts - cont skilled care - cont aricept  2. Grief reaction - at times forgets husband has passed - tearful when mentioning him today - cont ativan 0.25 mg Qhs prn  3. Chronic pain of both knees - probably OA, began with pain in left knee - suspect unsteady gait due to left knee pain and increased weight gain have contributed to right knee being affected -PT advised  03/22, but declined by family - cont prn tylenol and ice for pain - recommend weight loss   4. Obesity (BMI 30-39.9) - BMI 39.7 - 11 lbs weight gain in past 2 months - recommend reducing calories to 1500 per day - recommend increased physical activity- light walking or swimming  5. Acquired hypothyroidism - stable with levothyroxine  6. Primary hypertension - some readings elevated > 150/90, suspect due to pain - cont losartan and bystolic  7. Venous insufficiency - non-pitting edema observed, cont lasix regimen  8. Unsteady gait - gait very slow due to knee pain - recommend PT eval    Family/ staff Communication: plan discussed with patient and nurse  Labs/tests ordered:  Suggest routine labs cbc/diff, cmp, tsh, lipid panel 09/2020

## 2020-09-26 ENCOUNTER — Non-Acute Institutional Stay (SKILLED_NURSING_FACILITY): Payer: Medicare Other | Admitting: Adult Health

## 2020-09-26 ENCOUNTER — Encounter: Payer: Self-pay | Admitting: Adult Health

## 2020-09-26 DIAGNOSIS — E039 Hypothyroidism, unspecified: Secondary | ICD-10-CM

## 2020-09-26 DIAGNOSIS — F0150 Vascular dementia without behavioral disturbance: Secondary | ICD-10-CM

## 2020-09-26 DIAGNOSIS — D649 Anemia, unspecified: Secondary | ICD-10-CM | POA: Diagnosis not present

## 2020-09-26 DIAGNOSIS — I5032 Chronic diastolic (congestive) heart failure: Secondary | ICD-10-CM

## 2020-09-26 DIAGNOSIS — E669 Obesity, unspecified: Secondary | ICD-10-CM | POA: Diagnosis not present

## 2020-09-26 DIAGNOSIS — I10 Essential (primary) hypertension: Secondary | ICD-10-CM | POA: Diagnosis not present

## 2020-09-26 NOTE — Progress Notes (Signed)
Location:    [facility] Room Number: 124 Place of Service:  SNF (31) Provider:  Royal Hawthorn, NP  Gayland Curry, DO  Patient Care Team: Gayland Curry, DO as PCP - General (Geriatric Medicine) Sueanne Margarita, MD as PCP - Sleep Medicine (Sleep Medicine) Evans Lance, MD as PCP - Cardiology (Cardiology) Justice Britain, MD as Consulting Physician (Orthopedic Surgery) Gaynelle Arabian, MD as Consulting Physician (Orthopedic Surgery) Garvin Fila, MD as Consulting Physician (Neurology) Evans Lance, MD as Consulting Physician (Cardiology) Jettie Booze, MD as Consulting Physician (Interventional Cardiology) Shon Hough, MD as Consulting Physician (Ophthalmology) Community, [facility] ([place]) Royal Hawthorn, NP as Nurse Practitioner (Nurse Practitioner)  Extended Emergency Contact Information Primary Emergency Contact: Villarin,JED Address: [place], [place] Phone: [phone] Mobile Phone: [phone] Relation: Son Secondary Emergency Contact: [place] Mobile Phone: [phone] Relation: Daughter  Code Status:  DNR Goals of care: Advanced Directive information Advanced Directives 09/26/2020  Does Patient Have a Medical Advance Directive? Yes  Type of Paramedic of [place];Out of facility DNR (pink MOST or yellow form)  Does patient want to make changes to medical advance directive? No - Patient declined  Copy of [place] in Chart? Yes - validated most recent copy scanned in chart (See row information)  Pre-existing out of facility DNR order (yellow form or pink MOST form) -     Chief Complaint  Patient presents with  . Medical Management of Chronic Issues    HPI:  Pt is a 87 y.o. female seen today for medical management of chronic diseases.    She seems to be in better spirits  after mourning the loss of her husband several months ago. She reports she is sleeping well and her appetite is "too good". She has been gaining weight, 5 lbs in the past two months.  She is mostly sedentary as she no longer walks due to knee issues. PT was ordered in March but later discontinued (per family request).  She is taking ativan for sleep at night since the loss of her husband.   Denies any knee or other pain at this time  Denies feeling depressed. She states she wants to go home and wonders how long she will live in skilled care. She has dementia and forgets what led up to her admission which was progressive memory loss.    Continues on cpap with no issues sleeping, no difficulty breathing, etc.      Past Medical History:  Diagnosis Date  . Breast cancer ([place])   . Colon polyps   . Coronary artery disease 06/2006   STEMI inferior with BMS to mid RCA with mid basal inferior wall HK and luminal irrgularities elsewhere  . Fracture of femoral neck, right ([place]) 06/13/2011  . Fracture of humeral head, right, closed 06/13/2011  . GERD (gastroesophageal reflux disease)   . Hemorrhoids   . Hyperlipemia   . Hypertension   . LBBB (left bundle branch block)   . Macular degeneration   . Myocardial infarction ([place])   . Obstructive sleep apnea on CPAP   . Osteopenia   . Osteopenia   . Pacemaker 02/25/14  . Rhinitis   . Rotator cuff syndrome   . SAH (subarachnoid hemorrhage) ([place]) 02/22/14  . 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 Ulnar neuropathy   . Umbilical hernia   .  Unstable gait    Past Surgical History:  Procedure Laterality Date  . BREAST LUMPECTOMY    . CATARACT EXTRACTION, BILATERAL     Per [facility]  . CORONARY ANGIOPLASTY WITH STENT PLACEMENT  06/2006   Mid RCA BMS  . HIP ARTHROPLASTY  06/06/2011   Procedure: ARTHROPLASTY BIPOLAR HIP;  Surgeon: Gearlean Alf;  Location: [facility];  Service: Orthopedics;  Laterality: Right;  . PERMANENT PACEMAKER INSERTION N/A  02/25/2014   Procedure: PERMANENT PACEMAKER INSERTION;  Surgeon: Evans Lance, MD;  Location: [facility] CATH LAB;  Service: Cardiovascular;  Laterality: N/A;  . SHOULDER SURGERY    . TONSILLECTOMY     Per [facility]   . WRIST SURGERY      Allergies  Allergen Reactions  . Chlorpheniramine Anaphylaxis  . Codeine Anaphylaxis  . Nystatin Rash    redness  . Amlodipine     Per [facility]     Allergies as of 09/26/2020       Reactions   Chlorpheniramine Anaphylaxis   Codeine Anaphylaxis   Nystatin Rash   redness   Amlodipine    Per [facility]         Medication List        Accurate as of Sep 26, 2020  9:18 AM. If you have any questions, ask your nurse or doctor.          acetaminophen 500 MG tablet Commonly known as: TYLENOL Take 500 mg by mouth every 8 (eight) hours as needed.   ARTIFICIAL TEARS OP Apply 1 drop to eye as needed.   aspirin EC 81 MG tablet Take 81 mg by mouth daily.   Chlorhexidine Gluconate 4 % Soln Apply 1 application topically daily. On Tuesday or Friday   cholecalciferol 25 MCG (1000 UNIT) tablet Commonly known as: VITAMIN D Take 3,000 Units by mouth daily. In the morning between 8-11 am   donepezil 10 MG tablet Commonly known as: Aricept Take 1 tablet (10 mg total) by mouth at bedtime.   DULoxetine 60 MG capsule Commonly known as: CYMBALTA Take 60 mg by mouth daily.   furosemide 40 MG tablet Commonly known as: LASIX Take 40 mg by mouth daily. And prn edema   levothyroxine 25 MCG tablet Commonly known as: SYNTHROID Take 25 mcg by mouth daily before breakfast.   LORazepam 0.5 MG tablet Commonly known as: ATIVAN Take 0.25 mg by mouth at bedtime. What changed: Another medication with the same name was removed. Continue taking this medication, and follow the directions you see here. Changed by: Royal Hawthorn, NP   losartan 100 MG tablet Commonly known as: COZAAR Take 100 mg by mouth daily.    miconazole 2 % cream Commonly known as: MICOTIN Apply 1 application topically as needed (Apply to rash under breasts and abdominal folds as needed).   mineral oil-hydrophilic petrolatum ointment Apply topically as needed for dry skin.   nebivolol 10 MG tablet Commonly known as: Bystolic Take 1 tablet (10 mg total) by mouth daily.   nitroGLYCERIN 0.4 MG SL tablet Commonly known as: NITROSTAT Place 0.4 mg under the tongue every 5 (five) minutes as needed.   potassium chloride SA 20 MEQ tablet Commonly known as: KLOR-CON Take 20 mEq by mouth daily.   PRESERVISION AREDS 2+MULTI VIT PO Take by mouth 2 (two) times daily.   sodium fluoride 1.1 % Crea dental cream Commonly known as: PREVIDENT 5000 PLUS Place 1 application onto teeth every evening.  vitamin B-12 1000 MCG tablet Commonly known as: CYANOCOBALAMIN Take 1,000 mcg by mouth daily.        Review of Systems  Constitutional: Positive for unexpected weight change. Negative for activity change, appetite change, chills, diaphoresis, fatigue and fever.  HENT: Negative for congestion.   Respiratory: Negative for cough, shortness of breath and wheezing.   Cardiovascular: Positive for leg swelling. Negative for chest pain and palpitations.  Gastrointestinal: Negative for abdominal distention, abdominal pain, constipation and diarrhea.  Genitourinary: Negative for difficulty urinating and dysuria.  Musculoskeletal: Positive for arthralgias and gait problem. Negative for back pain, joint swelling and myalgias.  Neurological: Negative for dizziness, tremors, seizures, syncope, facial asymmetry, speech difficulty, weakness, light-headedness, numbness and headaches.  Psychiatric/Behavioral: Positive for confusion. Negative for agitation, behavioral problems, dysphoric mood and sleep disturbance. The patient is not nervous/anxious.     Immunization History  Administered Date(s) Administered  . Influenza, High Dose Seasonal PF  02/18/2017, 03/22/2019, 03/14/2020  . Influenza,inj,Quad PF,6+ Mos 03/24/2018  . Influenza-Unspecified 03/04/2016, 03/18/2020  . Moderna SARS-COV2 Booster Vaccination 04/03/2020  . Moderna Sars-Covid-2 Vaccination 06/04/2019, 07/03/2019  . Pneumococcal Conjugate-13 05/08/2014  . Pneumococcal Polysaccharide-23 10/08/2017  . Td 07/09/2002, 01/16/2013  . Zoster 06/30/2010  . Zoster Recombinat (Shingrix) 11/18/2017, 01/17/2018   Pertinent  Health Maintenance Due  Topic Date Due  . INFLUENZA VACCINE  12/22/2020  . DEXA SCAN  Completed  . PNA vac Low Risk Adult  Completed   Fall Risk  07/04/2019 12/21/2018 10/18/2018 06/14/2018 02/08/2018  Falls in the past year? 0 0 0 0 No  Number falls in past yr: 0 0 0 0 -  Injury with Fall? 0 0 0 0 -  Risk Factor Category  - - - - -  Risk for fall due to : - Impaired mobility - - -  Follow up - Falls evaluation completed;Falls prevention discussed - - -   Functional Status Survey:    Vitals:   09/26/20 0909  BP: (!) 165/76  Pulse: 62  Resp: 16  Temp: (!) 97.5 F (36.4 C)  SpO2: 96%  Weight: 247 lb 9.6 oz (112.3 kg)  Height: 5\' 6"  (1.676 m)   Body mass index is 39.96 kg/m. Physical Exam Vitals and nursing note reviewed.  Constitutional:      General: She is not in acute distress.    Appearance: She is not diaphoretic.  HENT:     Head: Normocephalic and atraumatic.  Neck:     Vascular: No JVD.  Cardiovascular:     Rate and Rhythm: Normal rate and regular rhythm.     Heart sounds: No murmur heard.   Pulmonary:     Effort: Pulmonary effort is normal. No respiratory distress.     Breath sounds: Normal breath sounds. No wheezing.  Abdominal:     General: Bowel sounds are normal. There is no distension.     Palpations: Abdomen is soft.     Tenderness: There is no abdominal tenderness.  Musculoskeletal:     Cervical back: No rigidity or tenderness.     Right lower leg: Edema (+1) present.     Left lower leg: Edema (+1) present.      Comments: Erythema to ble which is chronic   Lymphadenopathy:     Cervical: No cervical adenopathy.  Skin:    General: Skin is warm and dry.  Neurological:     General: No focal deficit present.     Mental Status: She is alert. Mental status is at baseline.  Psychiatric:        Mood and Affect: Mood normal.     Labs reviewed: Recent Labs    10/18/19 0000  NA 139  K 3.9  CL 102  CO2 24*  BUN 13  CREATININE 0.8  CALCIUM 9.2   Recent Labs    10/18/19 0000  AST 17  ALT 9  ALKPHOS 103  ALBUMIN 3.7   Recent Labs    10/18/19 0000  WBC 6.9  NEUTROABS 3  HGB 11.6*  HCT 35*  PLT 232   Lab Results  Component Value Date   TSH 3.77 10/18/2019   Lab Results  Component Value Date   HGBA1C 6.1 03/01/2019   Lab Results  Component Value Date   CHOL 200 10/18/2019   HDL 44 10/18/2019   LDLCALC 129 10/18/2019   TRIG 137 10/18/2019    Significant Diagnostic Results in last 30 days:  No results found.  Assessment/Plan  1. Primary hypertension Elevated on first check with automatic cuff but recheck was 142/76.  Recommend manual bps Continue Bystolic and Cozaar  2. Chronic diastolic heart failure (HCC) Compensated on exam Her weight trending up seems to be more related to intake and sedentary lifestyle  3. Acquired hypothyroidism Needs TSH  4. Vascular dementia without behavioral disturbance ([place]) Has short term memory loss, last MMSE 28/30 Remains pleasant and appropriate for the current setting   5. Anemia, unspecified type Lab Results  Component Value Date   HGB 11.6 (A) 10/18/2019   Needs cbc  6. Obesity (BMI 30-39.9) Weight trending up Continue heart health diet with small portions   Labs/tests ordered:  TSH CBC BMP

## 2020-09-29 DIAGNOSIS — E038 Other specified hypothyroidism: Secondary | ICD-10-CM | POA: Diagnosis not present

## 2020-09-29 DIAGNOSIS — D508 Other iron deficiency anemias: Secondary | ICD-10-CM | POA: Diagnosis not present

## 2020-09-29 LAB — BASIC METABOLIC PANEL
BUN: 14 (ref 4–21)
CO2: 26 — AB (ref 13–22)
Chloride: 101 (ref 99–108)
Creatinine: 0.9 (ref 0.5–1.1)
Glucose: 1490
Potassium: 4.6 (ref 3.4–5.3)
Sodium: 141 (ref 137–147)

## 2020-09-29 LAB — CBC: RBC: 4.25 (ref 3.87–5.11)

## 2020-09-29 LAB — CBC AND DIFFERENTIAL
HCT: 38 (ref 36–46)
Hemoglobin: 12.6 (ref 12.0–16.0)
Platelets: 246 (ref 150–399)
WBC: 7.2

## 2020-09-29 LAB — TSH: TSH: 3.96 (ref 0.41–5.90)

## 2020-10-06 ENCOUNTER — Other Ambulatory Visit: Payer: Self-pay | Admitting: Adult Health

## 2020-10-06 MED ORDER — LORAZEPAM 0.5 MG PO TABS: 0.2500 mg | ORAL_TABLET | Freq: Every day | ORAL | 2 refills | Status: DC

## 2020-11-11 ENCOUNTER — Ambulatory Visit (INDEPENDENT_AMBULATORY_CARE_PROVIDER_SITE_OTHER): Payer: Medicare Other

## 2020-11-11 DIAGNOSIS — I4430 Unspecified atrioventricular block: Secondary | ICD-10-CM | POA: Diagnosis not present

## 2020-11-11 LAB — CUP PACEART REMOTE DEVICE CHECK
Battery Remaining Longevity: 40 mo
Battery Remaining Percentage: 35 %
Battery Voltage: 2.93 V
Brady Statistic AP VP Percent: 71 %
Brady Statistic AP VS Percent: 1 %
Brady Statistic AS VP Percent: 28 %
Brady Statistic AS VS Percent: 1 %
Brady Statistic RA Percent Paced: 70 %
Brady Statistic RV Percent Paced: 98 %
Date Time Interrogation Session: 20220621110018
Implantable Lead Implant Date: 20151005
Implantable Lead Implant Date: 20151005
Implantable Lead Location: 753859
Implantable Lead Location: 753860
Implantable Pulse Generator Implant Date: 20151005
Lead Channel Impedance Value: 410 Ohm
Lead Channel Impedance Value: 580 Ohm
Lead Channel Pacing Threshold Amplitude: 0.875 V
Lead Channel Pacing Threshold Amplitude: 1.75 V
Lead Channel Pacing Threshold Pulse Width: 0.5 ms
Lead Channel Pacing Threshold Pulse Width: 0.5 ms
Lead Channel Sensing Intrinsic Amplitude: 12 mV
Lead Channel Sensing Intrinsic Amplitude: 5 mV
Lead Channel Setting Pacing Amplitude: 1.125
Lead Channel Setting Pacing Amplitude: 3.25 V
Lead Channel Setting Pacing Pulse Width: 0.5 ms
Lead Channel Setting Sensing Sensitivity: 4 mV
Pulse Gen Model: 2240
Pulse Gen Serial Number: 7666288

## 2020-11-14 ENCOUNTER — Encounter: Payer: Self-pay | Admitting: Adult Health

## 2020-11-14 ENCOUNTER — Non-Acute Institutional Stay (SKILLED_NURSING_FACILITY): Payer: Medicare Other | Admitting: Adult Health

## 2020-11-14 DIAGNOSIS — I10 Essential (primary) hypertension: Secondary | ICD-10-CM

## 2020-11-14 DIAGNOSIS — F0150 Vascular dementia without behavioral disturbance: Secondary | ICD-10-CM

## 2020-11-14 DIAGNOSIS — G4733 Obstructive sleep apnea (adult) (pediatric): Secondary | ICD-10-CM | POA: Diagnosis not present

## 2020-11-14 DIAGNOSIS — E669 Obesity, unspecified: Secondary | ICD-10-CM | POA: Diagnosis not present

## 2020-11-14 DIAGNOSIS — R739 Hyperglycemia, unspecified: Secondary | ICD-10-CM | POA: Diagnosis not present

## 2020-11-14 DIAGNOSIS — M810 Age-related osteoporosis without current pathological fracture: Secondary | ICD-10-CM

## 2020-11-14 DIAGNOSIS — I872 Venous insufficiency (chronic) (peripheral): Secondary | ICD-10-CM | POA: Diagnosis not present

## 2020-11-14 DIAGNOSIS — Z9989 Dependence on other enabling machines and devices: Secondary | ICD-10-CM | POA: Diagnosis not present

## 2020-11-14 NOTE — Progress Notes (Signed)
Location:  [facility] psychologist of Service:  SNF (31) Provider:   Cindi Carbon, [place] [phone]   Virgie Dad, MD  Patient Care Team: Virgie Dad, MD as PCP - General (Internal Medicine) Sueanne Margarita, MD as PCP - Sleep Medicine (Sleep Medicine) Evans Lance, MD as PCP - Cardiology (Cardiology) Justice Britain, MD as Consulting Physician (Orthopedic Surgery) Gaynelle Arabian, MD as Consulting Physician (Orthopedic Surgery) Garvin Fila, MD as Consulting Physician (Neurology) Evans Lance, MD as Consulting Physician (Cardiology) Jettie Booze, MD as Consulting Physician (Interventional Cardiology) Shon Hough, MD as Consulting Physician (Ophthalmology) Community, [facility] ([place]) Royal Hawthorn, NP as Nurse Practitioner (Nurse Practitioner)  Extended Emergency Contact Information Primary Emergency Contact: [place],JED Address: [place], [place] Phone: [phone] Mobile Phone: [phone] Relation: Son Secondary Emergency Contact: [place] Mobile Phone: [phone] Relation: Daughter  Code Status:  DNR Goals of care: Advanced Directive information Advanced Directives 09/26/2020  Does Patient Have a Medical Advance Directive? Yes  Type of Paramedic of [place];Out of facility DNR (pink MOST or yellow form)  Does patient want to make changes to medical advance directive? No - Patient declined  Copy of [place] in Chart? Yes - validated most recent copy scanned in chart (See row information)  Pre-existing out of facility DNR order (yellow form or pink MOST form) -     Chief Complaint  Patient presents with   Medical Management of Chronic Issues    HPI:  Pt is a 87 y.o. female seen today for medical management of chronic diseases.  PMH significant for breast ca,  CAD with stent, subarachnoid hemorrhage, dementia, incontinence, obesity, OP, HTN, diastolic CHF, GERD, pacemaker, among others   She continues to grieve after the loss of her husband but overall she is doing better. Gets sad and lonely at times at night. Sleeps well, takes ativan each night.  Participates in activities like painting  Continues to gain weight, loves sweets  Wt Readings from Last 3 Encounters:  09/26/20 247 lb 9.6 oz (112.3 kg)  08/26/20 247 lb (112 kg)  07/24/20 240 lb 14.4 oz (109.3 kg)   Has chronic unchanged edema in both legs and wears compression hose  No sob, pnd, cp etc     Past Medical History:  Diagnosis Date   Breast cancer ([place])    Colon polyps    Coronary artery disease 06/2006   STEMI inferior with BMS to mid RCA with mid basal inferior wall HK and luminal irrgularities elsewhere   Fracture of femoral neck, right ([place]) 06/13/2011   Fracture of humeral head, right, closed 06/13/2011   GERD (gastroesophageal reflux disease)    Hemorrhoids    Hyperlipemia    Hypertension    LBBB (left bundle branch block)    Macular degeneration    Myocardial infarction ([place])    Obstructive sleep apnea on CPAP    Osteopenia    Osteopenia    Pacemaker 02/25/14   Rhinitis    Rotator cuff syndrome    SAH (subarachnoid hemorrhage) ([place]) 02/22/14   Stokes Adams attack 02/22/14   Ulnar neuropathy    Umbilical hernia    Unstable gait    Past Surgical History:  Procedure Laterality Date   BREAST LUMPECTOMY     CATARACT EXTRACTION, BILATERAL     [facility]   CORONARY ANGIOPLASTY WITH STENT PLACEMENT  06/2006   Mid RCA BMS   HIP ARTHROPLASTY  06/06/2011   Procedure: ARTHROPLASTY BIPOLAR HIP;  Surgeon: Gearlean Alf;  Location: [facility];  Service: Orthopedics;  Laterality: Right;   PERMANENT PACEMAKER INSERTION N/A 02/25/2014   Procedure: PERMANENT PACEMAKER INSERTION;  Surgeon: Evans Lance, MD;  Location: [facility] CATH LAB;  Service: Cardiovascular;   Laterality: N/A;   SHOULDER SURGERY     TONSILLECTOMY     Per [facility]    WRIST SURGERY      Allergies  Allergen Reactions   Chlorpheniramine Anaphylaxis   Codeine Anaphylaxis   Nystatin Rash    redness   Amlodipine     Per [facility]     Outpatient Encounter Medications as of 11/14/2020  Medication Sig   cholecalciferol (VITAMIN D) 25 MCG (1000 UNIT) tablet Take 3,000 Units by mouth daily. In the morning between 8-11 am   LORazepam (ATIVAN) 0.5 MG tablet Take 0.5 tablets (0.25 mg total) by mouth at bedtime.   nebivolol (BYSTOLIC) 10 MG tablet Take 1 tablet (10 mg total) by mouth daily.   vitamin B-12 (CYANOCOBALAMIN) 1000 MCG tablet Take 1,000 mcg by mouth daily.   acetaminophen (TYLENOL) 500 MG tablet Take 500 mg by mouth every 8 (eight) hours as needed.    aspirin EC 81 MG tablet Take 81 mg by mouth daily.   Carboxymethylcellulose Sodium (ARTIFICIAL TEARS OP) Apply 1 drop to eye as needed.   Chlorhexidine Gluconate 4 % SOLN Apply 1 application topically daily. On Tuesday or Friday   donepezil (ARICEPT) 10 MG tablet Take 1 tablet (10 mg total) by mouth at bedtime.   DULoxetine (CYMBALTA) 60 MG capsule Take 60 mg by mouth daily.   furosemide (LASIX) 40 MG tablet Take 40 mg by mouth daily. And prn edema   levothyroxine (SYNTHROID, LEVOTHROID) 25 MCG tablet Take 25 mcg by mouth daily before breakfast.    losartan (COZAAR) 100 MG tablet Take 100 mg by mouth daily.   miconazole (MICOTIN) 2 % cream Apply 1 application topically as needed (Apply to rash under breasts and abdominal folds as needed).   mineral oil-hydrophilic petrolatum (AQUAPHOR) ointment Apply topically as needed for dry skin.   Multiple Vitamins-Minerals (PRESERVISION AREDS 2+MULTI VIT PO) Take by mouth 2 (two) times daily.   nitroGLYCERIN (NITROSTAT) 0.4 MG SL tablet Place 0.4 mg under the tongue every 5 (five) minutes as needed.   potassium chloride SA (K-DUR,KLOR-CON) 20 MEQ tablet Take  20 mEq by mouth daily.    sodium fluoride (PREVIDENT 5000 PLUS) 1.1 % CREA dental cream Place 1 application onto teeth every evening.   No facility-administered encounter medications on file as of 11/14/2020.    Review of Systems  Constitutional:  Negative for activity change, appetite change, chills, diaphoresis, fatigue, fever and unexpected weight change.  HENT:  Negative for congestion.   Respiratory:  Negative for cough, shortness of breath and wheezing.   Cardiovascular:  Positive for leg swelling. Negative for chest pain and palpitations.  Gastrointestinal:  Negative for abdominal distention, abdominal pain, constipation and diarrhea.  Genitourinary:  Negative for difficulty urinating and dysuria.  Musculoskeletal:  Positive for gait problem. Negative for arthralgias, back pain, joint swelling and myalgias.  Skin:  Negative for wound.  Neurological:  Negative for dizziness, tremors, seizures, syncope, facial asymmetry, speech difficulty, weakness, light-headedness, numbness and headaches.  Psychiatric/Behavioral:  Positive for confusion. Negative for agitation, behavioral problems and  sleep disturbance. The patient is not nervous/anxious.    Immunization History  Administered Date(s) Administered   Influenza, High Dose Seasonal PF 02/18/2017, 03/22/2019, 03/14/2020   Influenza,inj,Quad PF,6+ Mos 03/24/2018   Influenza-Unspecified 03/04/2016, 03/18/2020   Moderna SARS-COV2 Booster Vaccination 04/03/2020   Moderna Sars-Covid-2 Vaccination 06/04/2019, 07/03/2019   Pneumococcal Conjugate-13 05/08/2014   Pneumococcal Polysaccharide-23 10/08/2017   Td 07/09/2002, 01/16/2013   Zoster Recombinat (Shingrix) 11/18/2017, 01/17/2018   Zoster, Live 06/30/2010   Pertinent  Health Maintenance Due  Topic Date Due   INFLUENZA VACCINE  12/22/2020   DEXA SCAN  Completed   PNA vac Low Risk Adult  Completed   Fall Risk  07/04/2019 12/21/2018 10/18/2018 06/14/2018 02/08/2018  Falls in the past year?  0 0 0 0 No  Number falls in past yr: 0 0 0 0 -  Injury with Fall? 0 0 0 0 -  Risk Factor Category  - - - - -  Risk for fall due to : - Impaired mobility - - -  Follow up - Falls evaluation completed;Falls prevention discussed - - -   Functional Status Survey:    There were no vitals filed for this visit. There is no height or weight on file to calculate BMI. Physical Exam Vitals and nursing note reviewed.  Constitutional:      General: She is not in acute distress.    Appearance: She is not diaphoretic.  HENT:     Head: Normocephalic and atraumatic.     Mouth/Throat:     Mouth: Mucous membranes are moist.     Pharynx: Oropharynx is clear.  Neck:     Vascular: No JVD.  Cardiovascular:     Rate and Rhythm: Normal rate and regular rhythm.     Heart sounds: No murmur heard. Pulmonary:     Effort: Pulmonary effort is normal. No respiratory distress.     Breath sounds: Normal breath sounds. No wheezing.  Abdominal:     General: Bowel sounds are normal. There is no distension.     Palpations: Abdomen is soft.     Tenderness: There is no abdominal tenderness.  Musculoskeletal:     Cervical back: No rigidity or tenderness.     Right lower leg: Edema (+1) present.     Left lower leg: Edema (+1) present.  Lymphadenopathy:     Cervical: No cervical adenopathy.  Skin:    General: Skin is warm and dry.  Neurological:     Mental Status: She is alert and oriented to person, place, and time.  Psychiatric:        Mood and Affect: Mood normal.    Labs reviewed: No results for input(s): NA, K, CL, CO2, GLUCOSE, BUN, CREATININE, CALCIUM, MG, PHOS in the last 8760 hours. No results for input(s): AST, ALT, ALKPHOS, BILITOT, PROT, ALBUMIN in the last 8760 hours. No results for input(s): WBC, NEUTROABS, HGB, HCT, MCV, PLT in the last 8760 hours. Lab Results  Component Value Date   TSH 3.77 10/18/2019   Lab Results  Component Value Date   HGBA1C 6.1 03/01/2019   Lab Results   Component Value Date   CHOL 200 10/18/2019   HDL 44 10/18/2019   LDLCALC 129 10/18/2019   TRIG 137 10/18/2019    Significant Diagnostic Results in last 30 days:  CUP PACEART REMOTE DEVICE CHECK  Result Date: 11/11/2020 Scheduled remote reviewed. Normal device function.  AT burden <1%. Longest episode 16 sec. Next remote 91 days. LH   Assessment/Plan 1. Vascular  dementia without behavioral disturbance (HCC) Moderate Progressive decline in cognition and physical function c/w the disease. Continue supportive care in the skilled environment.  2. Obstructive sleep apnea on CPAP No current issues Uses cpap qhs  3. Obesity (BMI 30-39.9) Recommend reduce portions, weight trending up   4. Age-related osteoporosis without current pathological fracture No longer weight bearing, would not benefit form dexa scans, meds etc.   5. Venous insufficiency Keep legs elevated Give lasix daily and as needed  Continue compression hose  6. Primary hypertension Controlled Continue bystolic, losartan  7. Hyperglycemia Fasting 148 Not really able to do diet changes due to her memory loss Continue to monitor.

## 2020-12-01 NOTE — Progress Notes (Signed)
Remote pacemaker transmission.   

## 2020-12-15 ENCOUNTER — Encounter: Payer: Self-pay | Admitting: Internal Medicine

## 2020-12-15 ENCOUNTER — Non-Acute Institutional Stay (SKILLED_NURSING_FACILITY): Payer: Medicare Other | Admitting: Internal Medicine

## 2020-12-15 DIAGNOSIS — I10 Essential (primary) hypertension: Secondary | ICD-10-CM | POA: Diagnosis not present

## 2020-12-15 DIAGNOSIS — F0150 Vascular dementia without behavioral disturbance: Secondary | ICD-10-CM

## 2020-12-15 DIAGNOSIS — G4733 Obstructive sleep apnea (adult) (pediatric): Secondary | ICD-10-CM | POA: Diagnosis not present

## 2020-12-15 DIAGNOSIS — Z9989 Dependence on other enabling machines and devices: Secondary | ICD-10-CM | POA: Diagnosis not present

## 2020-12-15 DIAGNOSIS — I5032 Chronic diastolic (congestive) heart failure: Secondary | ICD-10-CM

## 2020-12-15 DIAGNOSIS — E039 Hypothyroidism, unspecified: Secondary | ICD-10-CM | POA: Diagnosis not present

## 2020-12-15 DIAGNOSIS — G250 Essential tremor: Secondary | ICD-10-CM

## 2020-12-15 NOTE — Progress Notes (Signed)
Location:   [facility] Room Number: [id number] Place of Service:  SNF [phone]) Provider:  Veleta Miners MD  Virgie Dad, MD  Patient Care Team: Virgie Dad, MD as PCP - General (Internal Medicine) Sueanne Margarita, MD as PCP - Sleep Medicine (Sleep Medicine) Evans Lance, MD as PCP - Cardiology (Cardiology) Justice Britain, MD as Consulting Physician (Orthopedic Surgery) Gaynelle Arabian, MD as Consulting Physician (Orthopedic Surgery) Garvin Fila, MD as Consulting Physician (Neurology) Evans Lance, MD as Consulting Physician (Cardiology) Jettie Booze, MD as Consulting Physician (Interventional Cardiology) Shon Hough, MD as Consulting Physician (Ophthalmology) Community, [facility] ([place]) Royal Hawthorn, NP as Nurse Practitioner (Nurse Practitioner)  Extended Emergency Contact Information Primary Emergency Contact: [place],JED Address: [address], [place] of [place] Phone: [phone] Mobile Phone: [phone] Relation: Son Secondary Emergency Contact: [place] Mobile Phone: [phone] Relation: Daughter  Code Status:  DNR Goals of care: Advanced Directive information Advanced Directives 12/15/2020  Does Patient Have a Medical Advance Directive? Yes  Type of Paramedic of [place];Out of facility DNR (pink MOST or yellow form)  Does patient want to make changes to medical advance directive? No - Patient declined  Copy of [place] in Chart? Yes - validated most recent copy scanned in chart (See row information)  Pre-existing out of facility DNR order (yellow form or pink MOST form) Yellow form placed in chart (order not valid for inpatient use)     Chief Complaint  Patient presents with   Medical Management of Chronic Issues   Health Maintenance    #4 covid vaccine    HPI:  Pt is a 87 y.o.  female seen today for medical management of chronic diseases.    Patient has h/o Chronic Diastolic CHF, Urinary Incontinence, HTN, Osteoporosis, HLD, B 12 Def, anemia, s/p Breast cancer and Vascular Dementia h/o SAH OSA  Doing well per Nurses. She denied any issue Has been on Ativan to help with her Anxiety Has gained some weight Doe shave Swelling in her legs Wear Ted hoses No SOB or Cough Mostly wheelchair Bound but does walk with her walker sometimes with Mild Assist  Past Medical History:  Diagnosis Date   Breast cancer ([place])    Colon polyps    Coronary artery disease 06/2006   STEMI inferior with BMS to mid RCA with mid basal inferior wall HK and luminal irrgularities elsewhere   Fracture of femoral neck, right ([place]) 06/13/2011   Fracture of humeral head, right, closed 06/13/2011   GERD (gastroesophageal reflux disease)    Hemorrhoids    Hyperlipemia    Hypertension    LBBB (left bundle branch block)    Macular degeneration    Myocardial infarction ([place])    Obstructive sleep apnea on CPAP    Osteopenia    Osteopenia    Pacemaker 02/25/14   Rhinitis    Rotator cuff syndrome    SAH (subarachnoid hemorrhage) ([place]) 02/22/14   Stokes Adams attack 02/22/14   Ulnar neuropathy    Umbilical hernia    Unstable gait    Past Surgical History:  Procedure Laterality Date   BREAST LUMPECTOMY     CATARACT EXTRACTION, BILATERAL     Per [facility]   CORONARY ANGIOPLASTY WITH STENT PLACEMENT  06/2006   Mid RCA BMS   HIP ARTHROPLASTY  06/06/2011   Procedure: ARTHROPLASTY  BIPOLAR HIP;  Surgeon: Dione Plover Aluisio;  Location: [facility];  Service: Orthopedics;  Laterality: Right;   PERMANENT PACEMAKER INSERTION N/A 02/25/2014   Procedure: PERMANENT PACEMAKER INSERTION;  Surgeon: Evans Lance, MD;  Location: [facility] CATH LAB;  Service: Cardiovascular;  Laterality: N/A;   SHOULDER SURGERY     TONSILLECTOMY     Per [facility]    WRIST SURGERY      Allergies  Allergen  Reactions   Chlorpheniramine Anaphylaxis   Codeine Anaphylaxis   Nystatin Rash    redness   Amlodipine     Per [facility]     Allergies as of 12/15/2020       Reactions   Chlorpheniramine Anaphylaxis   Codeine Anaphylaxis   Nystatin Rash   redness   Amlodipine    Per [facility]         Medication List        Accurate as of December 15, 2020 10:42 AM. If you have any questions, ask your nurse or doctor.          acetaminophen 500 MG tablet Commonly known as: TYLENOL Take 500 mg by mouth every 8 (eight) hours as needed.   ARTIFICIAL TEARS OP Apply 1 drop to eye as needed.   aspirin EC 81 MG tablet Take 81 mg by mouth daily.   Chlorhexidine Gluconate 4 % Soln Apply 1 application topically daily. On Tuesday or Friday   cholecalciferol 25 MCG (1000 UNIT) tablet Commonly known as: VITAMIN D Take 3,000 Units by mouth daily. In the morning between 8-11 am   donepezil 10 MG tablet Commonly known as: Aricept Take 1 tablet (10 mg total) by mouth at bedtime.   DULoxetine 60 MG capsule Commonly known as: CYMBALTA Take 60 mg by mouth daily.   furosemide 40 MG tablet Commonly known as: LASIX Take 40 mg by mouth daily. And prn edema   levothyroxine 25 MCG tablet Commonly known as: SYNTHROID Take 25 mcg by mouth daily before breakfast.   LORazepam 0.5 MG tablet Commonly known as: ATIVAN Take 0.5 tablets (0.25 mg total) by mouth at bedtime.   losartan 100 MG tablet Commonly known as: COZAAR Take 100 mg by mouth daily.   miconazole 2 % cream Commonly known as: MICOTIN Apply 1 application topically as needed (Apply to rash under breasts and abdominal folds as needed).   mineral oil-hydrophilic petrolatum ointment Apply topically as needed for dry skin.   nebivolol 10 MG tablet Commonly known as: Bystolic Take 1 tablet (10 mg total) by mouth daily.   nitroGLYCERIN 0.4 MG SL tablet Commonly known as: NITROSTAT Place 0.4 mg under  the tongue every 5 (five) minutes as needed.   potassium chloride SA 20 MEQ tablet Commonly known as: KLOR-CON Take 20 mEq by mouth daily.   PRESERVISION AREDS 2+MULTI VIT PO Take by mouth 2 (two) times daily.   sodium fluoride 1.1 % Crea dental cream Commonly known as: PREVIDENT 5000 PLUS Place 1 application onto teeth every evening.   vitamin B-12 1000 MCG tablet Commonly known as: CYANOCOBALAMIN Take 1,000 mcg by mouth daily.        Review of Systems Review of Systems  Constitutional: Negative for activity change, appetite change, chills, diaphoresis, fatigue and fever.  HENT: Negative for mouth sores, postnasal drip, rhinorrhea, sinus pain and sore throat.   Respiratory: Negative for apnea, cough, chest tightness, shortness of breath and wheezing.   Cardiovascular: Negative for chest pain, palpitations and  leg swelling.  Gastrointestinal: Negative for abdominal distention, abdominal pain, constipation, diarrhea, nausea and vomiting.  Genitourinary: Negative for dysuria and frequency.  Musculoskeletal: Negative for arthralgias, joint swelling and myalgias.  Skin: Negative for rash.  Neurological: Negative for dizziness, syncope, weakness, light-headedness and numbness.  Psychiatric/Behavioral: Negative for behavioral problems, and sleep disturbance.    Immunization History  Administered Date(s) Administered   Influenza, High Dose Seasonal PF 02/18/2017, 03/22/2019, 03/14/2020   Influenza,inj,Quad PF,6+ Mos 03/24/2018   Influenza-Unspecified 03/04/2016, 03/18/2020   Moderna SARS-COV2 Booster Vaccination 04/03/2020   Moderna Sars-Covid-2 Vaccination 06/04/2019, 07/03/2019   Pneumococcal Conjugate-13 05/08/2014   Pneumococcal Polysaccharide-23 10/08/2017   Td 07/09/2002, 01/16/2013   Zoster Recombinat (Shingrix) 11/18/2017, 01/17/2018   Zoster, Live 06/30/2010   Pertinent  Health Maintenance Due  Topic Date Due   INFLUENZA VACCINE  12/22/2020   DEXA SCAN  Completed    PNA vac Low Risk Adult  Completed   Fall Risk  07/04/2019 12/21/2018 10/18/2018 06/14/2018 02/08/2018  Falls in the past year? 0 0 0 0 No  Number falls in past yr: 0 0 0 0 -  Injury with Fall? 0 0 0 0 -  Risk Factor Category  - - - - -  Risk for fall due to : - Impaired mobility - - -  Follow up - Falls evaluation completed;Falls prevention discussed - - -   Functional Status Survey:    Vitals:   12/15/20 1022  BP: (!) 142/70  Pulse: 64  Resp: 18  Temp: (!) 97 F (36.1 C)  SpO2: 96%  Weight: 154 lb (69.9 kg)  Height: '5\' 6"'$  (1.676 m)   Body mass index is 24.86 kg/m. Physical Exam Constitutional:  Well-developed and well-nourished.  HENT:  Head: Normocephalic.  Mouth/Throat: Oropharynx is clear and moist.  Eyes: Pupils are equal, round, and reactive to light.  Neck: Neck supple.  Cardiovascular: Normal rate and normal heart sounds.  No murmur heard. Pulmonary/Chest: Effort normal and breath sounds normal. No respiratory distress. No wheezes. She has no rales.  Abdominal: Soft. Bowel sounds are normal. No distension. There is no tenderness. There is no rebound.  Musculoskeletal: Moderate Edema Bilateral Lymphadenopathy: none Neurological: No Focal deficits Does repeat Herself a lot But Pleasant Skin: Skin is warm and dry.  Psychiatric: Normal mood and affect. Behavior is normal. Thought content normal.   Labs reviewed: Recent Labs    09/29/20 0000  NA 141  K 4.6  CL 101  CO2 26*  BUN 14  CREATININE 0.9   No results for input(s): AST, ALT, ALKPHOS, BILITOT, PROT, ALBUMIN in the last 8760 hours. Recent Labs    09/29/20 0000  WBC 7.2  HGB 12.6  HCT 38  PLT 246   Lab Results  Component Value Date   TSH 3.96 09/29/2020   Lab Results  Component Value Date   HGBA1C 6.1 03/01/2019   Lab Results  Component Value Date   CHOL 200 10/18/2019   HDL 44 10/18/2019   LDLCALC 129 10/18/2019   TRIG 137 10/18/2019    Significant Diagnostic Results in last 30  days:  No results found.  Assessment/Plan  Vascular dementia without behavioral disturbance (HCC) Supportive care On Aspirin  Also on Aricept Was never started on Statin due to her age Obstructive sleep apnea on CPAP Tolerating CPAP Primary hypertension Stable in Losartan and Bystolic Acquired hypothyroidism TSH normal in 123456 Chronic diastolic heart failure (HCC) On Lasix Creat stable Essential tremor Supportive care B12 Def Continue Supplement Anxiety  On Ativan Will continue for now No Research scientist (life sciences) Communication:   Labs/tests ordered:

## 2021-01-06 ENCOUNTER — Non-Acute Institutional Stay (SKILLED_NURSING_FACILITY): Payer: Medicare Other | Admitting: Orthopedic Surgery

## 2021-01-06 ENCOUNTER — Encounter: Payer: Self-pay | Admitting: Orthopedic Surgery

## 2021-01-06 DIAGNOSIS — Z9989 Dependence on other enabling machines and devices: Secondary | ICD-10-CM | POA: Diagnosis not present

## 2021-01-06 DIAGNOSIS — G250 Essential tremor: Secondary | ICD-10-CM | POA: Diagnosis not present

## 2021-01-06 DIAGNOSIS — H6121 Impacted cerumen, right ear: Secondary | ICD-10-CM

## 2021-01-06 DIAGNOSIS — I10 Essential (primary) hypertension: Secondary | ICD-10-CM | POA: Diagnosis not present

## 2021-01-06 DIAGNOSIS — F3341 Major depressive disorder, recurrent, in partial remission: Secondary | ICD-10-CM | POA: Diagnosis not present

## 2021-01-06 DIAGNOSIS — F0150 Vascular dementia without behavioral disturbance: Secondary | ICD-10-CM

## 2021-01-06 DIAGNOSIS — I5032 Chronic diastolic (congestive) heart failure: Secondary | ICD-10-CM

## 2021-01-06 DIAGNOSIS — G4733 Obstructive sleep apnea (adult) (pediatric): Secondary | ICD-10-CM

## 2021-01-06 DIAGNOSIS — E039 Hypothyroidism, unspecified: Secondary | ICD-10-CM | POA: Diagnosis not present

## 2021-01-06 NOTE — Progress Notes (Signed)
Location:  [facility] Room Number: 124/A Place of Service:  SNF (31) Provider:  Yvonna Alanis, NP   Patient Care Team: Virgie Dad, MD as PCP - General (Internal Medicine) Sueanne Margarita, MD as PCP - Sleep Medicine (Sleep Medicine) Evans Lance, MD as PCP - Cardiology (Cardiology) Justice Britain, MD as Consulting Physician (Orthopedic Surgery) Gaynelle Arabian, MD as Consulting Physician (Orthopedic Surgery) Garvin Fila, MD as Consulting Physician (Neurology) Evans Lance, MD as Consulting Physician (Cardiology) Jettie Booze, MD as Consulting Physician (Interventional Cardiology) Shon Hough, MD as Consulting Physician (Ophthalmology) Community, [facility] ([place]) Royal Hawthorn, NP as Nurse Practitioner (Nurse Practitioner)  Extended Emergency Contact Information Primary Emergency Contact: [place],JED Address: [place], [place] of [place] Phone: [phone] Mobile Phone: [phone] Relation: Son Secondary Emergency Contact: [place] Mobile Phone: [phone] Relation: Daughter  Code Status:  DNR Goals of care: Advanced Directive information Advanced Directives 01/06/2021  Does Patient Have a Medical Advance Directive? Yes  Type of Paramedic of [place];Out of facility DNR (pink MOST or yellow form)  Does patient want to make changes to medical advance directive? No - Patient declined  Copy of [place] in Chart? Yes - validated most recent copy scanned in chart (See row information)  Pre-existing out of facility DNR order (yellow form or pink MOST form) Yellow form placed in chart (order not valid for inpatient use)     Chief Complaint  Patient presents with   Medical Management of Chronic Issues    Routine visit and discuss need for covid and flu vaccine or exclude    HPI:  Pt is a 87  y.o. female seen today for medical management of chronic diseases.    She currently resides on the skilled nursing unit at [facility] Past medical history includes: CAD, aortic insufficiency, HTN, MI, OSA, GERD, tremor, vascular dementia, s/p breast cancer, osteoporosis, obesity, and depression.   Vascular dementia- very pleasant, sometimes confused about husbands passing, remains social with others, MMSE 28/30 05/2020, no recent behavioral outbursts, Aricept 10 mg qhs OSA- CPAP qhs HTN- BUN/creat 14/0.9 123456, Bystolic daily, losartan 123XX123 mg daily  Hypothyroidism- TSH 3.96 09/29/2020, levothyroxine 25 mcg daily CHF- denies sob and weight fluctuations, BLE with edema, wears ted hose daily, furosemide 40 mg daily and prn Essential tremor- resting hand tremor noted during encounter, unchanged from last visit Anxiety/depression- no recent panic attacks, ativan 0.5 mg q hs, Cymbalta 60 mg daily   She complains of toe nail being too long. Requesting nurses trim them.   No recent falls, injuries, or behavioral outbursts.   Recent blood pressures:  No recent readings.   Recent weights:  08/01- 254 lbs  07/01- 255 lbs  06/01- 252.2 lbs  Nurse does not report any other concerns, vitals stable.   Past Medical History:  Diagnosis Date   Breast cancer ([place])    Colon polyps    Coronary artery disease 06/24/2006   STEMI inferior with BMS to mid RCA with mid basal inferior wall HK and luminal irrgularities elsewhere   Fracture of femoral neck, right ([place]) 06/13/2011   Fracture of humeral head, right, closed 06/13/2011   GERD (gastroesophageal reflux disease)    Hemorrhoids    Hyperlipemia    Hypertension    LBBB (left bundle branch block)    Macular degeneration    Myocardial infarction (  [place])    Obstructive sleep apnea on CPAP    Osteopenia    Osteopenia    Pacemaker 02/25/2014   Rhinitis    Rotator cuff syndrome    SAH (subarachnoid hemorrhage) ([place]) 02/22/2014   Stokes Adams  attack 02/22/2014   Ulnar neuropathy    Umbilical hernia    Unstable gait    Wears hearing aid    Per [facility] records   Past Surgical History:  Procedure Laterality Date   BREAST LUMPECTOMY     CATARACT EXTRACTION, BILATERAL     Per [facility]   CORONARY ANGIOPLASTY WITH STENT PLACEMENT  06/2006   Mid RCA BMS   HIP ARTHROPLASTY  06/06/2011   Procedure: ARTHROPLASTY BIPOLAR HIP;  Surgeon: Gearlean Alf;  Location: [facility];  Service: Orthopedics;  Laterality: Right;   PERMANENT PACEMAKER INSERTION N/A 02/25/2014   Procedure: PERMANENT PACEMAKER INSERTION;  Surgeon: Evans Lance, MD;  Location: [facility] CATH LAB;  Service: Cardiovascular;  Laterality: N/A;   SHOULDER SURGERY     TONSILLECTOMY     Per [facility]    WRIST SURGERY      Allergies  Allergen Reactions   Chlorpheniramine Anaphylaxis   Codeine Anaphylaxis   Nystatin Rash    redness   Amlodipine     Per [facility]     Outpatient Encounter Medications as of 01/06/2021  Medication Sig   acetaminophen (TYLENOL) 500 MG tablet Take 500 mg by mouth every 8 (eight) hours as needed.    aspirin EC 81 MG tablet Take 81 mg by mouth daily.   Carboxymethylcellulose Sodium (ARTIFICIAL TEARS OP) Apply 1 drop to eye as needed.   Chlorhexidine Gluconate 4 % SOLN Apply 1 application topically daily. On Tuesday or Friday   cholecalciferol (VITAMIN D) 25 MCG (1000 UNIT) tablet Take 3,000 Units by mouth daily. In the morning between 8-11 am   donepezil (ARICEPT) 10 MG tablet Take 1 tablet (10 mg total) by mouth at bedtime.   DULoxetine (CYMBALTA) 60 MG capsule Take 60 mg by mouth daily.   furosemide (LASIX) 40 MG tablet Take 40 mg by mouth daily. And prn edema   levothyroxine (SYNTHROID, LEVOTHROID) 25 MCG tablet Take 25 mcg by mouth daily before breakfast.    LORazepam (ATIVAN) 0.5 MG tablet Take 0.5 tablets (0.25 mg total) by mouth at bedtime.   losartan (COZAAR) 100 MG tablet Take 100 mg by mouth  daily.   miconazole (MICOTIN) 2 % cream Apply 1 application topically as needed (Apply to rash under breasts and abdominal folds as needed).   mineral oil-hydrophilic petrolatum (AQUAPHOR) ointment Apply topically as needed for dry skin.   Multiple Vitamins-Minerals (PRESERVISION AREDS 2+MULTI VIT PO) Take by mouth 2 (two) times daily.   nebivolol (BYSTOLIC) 10 MG tablet Take 1 tablet (10 mg total) by mouth daily.   nitroGLYCERIN (NITROSTAT) 0.4 MG SL tablet Place 0.4 mg under the tongue every 5 (five) minutes as needed.   potassium chloride SA (K-DUR,KLOR-CON) 20 MEQ tablet Take 20 mEq by mouth daily.    sodium fluoride (PREVIDENT 5000 PLUS) 1.1 % CREA dental cream Place 1 application onto teeth every evening.   UNABLE TO FIND CPAP Machine   vitamin B-12 (CYANOCOBALAMIN) 1000 MCG tablet Take 1,000 mcg by mouth daily.   No facility-administered encounter medications on file as of 01/06/2021.    Review of Systems  Constitutional:  Negative for activity change, appetite change, fatigue and fever.  HENT:  Negative for dental  problem, hearing loss and trouble swallowing.   Eyes:  Negative for visual disturbance.  Respiratory:  Negative for cough, shortness of breath and wheezing.   Cardiovascular:  Positive for leg swelling. Negative for chest pain.  Gastrointestinal:  Negative for abdominal distention, abdominal pain, constipation, diarrhea and nausea.  Genitourinary:  Negative for dysuria, frequency and hematuria.  Musculoskeletal:  Positive for arthralgias, gait problem and myalgias.       Bilateral knee pain  Skin: Negative.   Neurological:  Positive for tremors and weakness. Negative for dizziness and headaches.  Psychiatric/Behavioral:  Positive for confusion. Negative for dysphoric mood and sleep disturbance. The patient is not nervous/anxious.    Immunization History  Administered Date(s) Administered   Influenza, High Dose Seasonal PF 02/18/2017, 03/22/2019, 03/14/2020    Influenza,inj,Quad PF,6+ Mos 03/24/2018   Influenza-Unspecified 03/04/2016, 03/18/2020   Moderna SARS-COV2 Booster Vaccination 04/03/2020   Moderna Sars-Covid-2 Vaccination 06/04/2019, 07/03/2019   Pneumococcal Conjugate-13 05/08/2014   Pneumococcal Polysaccharide-23 10/08/2017   Td 07/09/2002, 01/16/2013   Zoster Recombinat (Shingrix) 11/18/2017, 01/17/2018   Zoster, Live 06/30/2010   Pertinent  Health Maintenance Due  Topic Date Due   INFLUENZA VACCINE  12/22/2020   DEXA SCAN  Completed   PNA vac Low Risk Adult  Completed   Fall Risk  07/04/2019 12/21/2018 10/18/2018 06/14/2018 02/08/2018  Falls in the past year? 0 0 0 0 No  Number falls in past yr: 0 0 0 0 -  Injury with Fall? 0 0 0 0 -  Risk Factor Category  - - - - -  Risk for fall due to : - Impaired mobility - - -  Follow up - Falls evaluation completed;Falls prevention discussed - - -   Functional Status Survey:    Vitals:   01/06/21 1039  BP: (!) 142/70  Pulse: 64  Resp: 18  Temp: (!) 97 F (36.1 C)  TempSrc: Temporal  SpO2: 96%  Weight: 255 lb (115.7 kg)  Height: '5\' 6"'$  (1.676 m)   Body mass index is 41.16 kg/m. Physical Exam Vitals reviewed.  Constitutional:      General: She is not in acute distress. HENT:     Head: Normocephalic.     Right Ear: There is impacted cerumen.     Left Ear: There is no impacted cerumen.     Nose: Nose normal.     Mouth/Throat:     Mouth: Mucous membranes are moist.  Eyes:     General:        Right eye: No discharge.        Left eye: No discharge.  Neck:     Thyroid: No thyroid mass or thyromegaly.     Vascular: No carotid bruit.  Cardiovascular:     Rate and Rhythm: Normal rate and regular rhythm.     Pulses: Normal pulses.     Heart sounds: Normal heart sounds. No murmur heard. Pulmonary:     Effort: Pulmonary effort is normal. No respiratory distress.     Breath sounds: Normal breath sounds. No wheezing.  Abdominal:     General: Bowel sounds are normal. There is  no distension.     Palpations: Abdomen is soft.     Tenderness: There is no abdominal tenderness.  Musculoskeletal:     Cervical back: Normal range of motion.     Right lower leg: Edema present.     Left lower leg: Edema present.     Comments: Non-pitting, ted hose on  Lymphadenopathy:  Cervical: No cervical adenopathy.  Skin:    General: Skin is warm and dry.     Capillary Refill: Capillary refill takes less than 2 seconds.  Neurological:     General: No focal deficit present.     Mental Status: She is alert. Mental status is at baseline.     Motor: Weakness present.     Gait: Gait abnormal.     Comments: Wheelchair  Psychiatric:        Mood and Affect: Mood normal.        Behavior: Behavior normal.        Cognition and Memory: Memory is impaired.    Labs reviewed: Recent Labs    09/29/20 0000  NA 141  K 4.6  CL 101  CO2 26*  BUN 14  CREATININE 0.9   No results for input(s): AST, ALT, ALKPHOS, BILITOT, PROT, ALBUMIN in the last 8760 hours. Recent Labs    09/29/20 0000  WBC 7.2  HGB 12.6  HCT 38  PLT 246   Lab Results  Component Value Date   TSH 3.96 09/29/2020   Lab Results  Component Value Date   HGBA1C 6.1 03/01/2019   Lab Results  Component Value Date   CHOL 200 10/18/2019   HDL 44 10/18/2019   LDLCALC 129 10/18/2019   TRIG 137 10/18/2019    Significant Diagnostic Results in last 30 days:  No results found.  Assessment/Plan 1. Vascular dementia without behavioral disturbance (HCC) - no behavioral outbursts - MMSE 28/30 05/2020 - cont Aricept 10 mg daily - cont skilled nursing care  2. Obstructive sleep apnea on CPAP - CPAP qhs  3. Primary hypertension - no readings for past month - start weekly blood pressures - cont benicar and losartan  4. Acquired hypothyroidism - TSH 3.96 09/29/2020 - cont levothyroxine 25 mcg daily  5. Chronic diastolic heart failure (HCC) - no sob or weight fluctuations, some BLE (non-pitting) - cont  furosemide 40 mg daily/prn - cont ted hose  6. Essential tremor - cont to monitor  7. Depression, major, recurrent, in partial remission ([place]) - forgets husband passed at times - cont Cymbalta 60 mg daily   8. Impacted cerumen of right ear - cannot visualize TM - debrox 5 gtts to right ear bid x 5 days - flush right ear with warm water when debrox complete    Family/ staff Communication: plan discussed with patient and nurse  Labs/tests ordered:  none

## 2021-01-13 DIAGNOSIS — Z23 Encounter for immunization: Secondary | ICD-10-CM | POA: Diagnosis not present

## 2021-01-15 ENCOUNTER — Encounter: Payer: Self-pay | Admitting: Adult Health

## 2021-01-15 ENCOUNTER — Non-Acute Institutional Stay (INDEPENDENT_AMBULATORY_CARE_PROVIDER_SITE_OTHER): Payer: Medicare Other | Admitting: Adult Health

## 2021-01-15 DIAGNOSIS — Z0000 Encounter for general adult medical examination without abnormal findings: Secondary | ICD-10-CM

## 2021-01-15 NOTE — Patient Instructions (Signed)
Ms. Orders , Thank you for taking time to come for your Medicare Wellness Visit. I appreciate your ongoing commitment to your health goals. Please review the following plan we discussed and let me know if I can assist you in the future.   Screening recommendations/referrals: Colonoscopy aged out Mammogram aged out Bone Density not ambulatory Recommended yearly ophthalmology/optometry visit for glaucoma screening and checkup Recommended yearly dental visit for hygiene and checkup  Vaccinations: Influenza vaccine due in Oct Pneumococcal vaccine up to date Tdap vaccine up to date Shingles vaccine done    Advanced directives: reviewed  Conditions/risks identified: fall and cardiac risk  Next appointment: 1 year   Preventive Care 34 Years and Older, Female Preventive care refers to lifestyle choices and visits with your health care provider that can promote health and wellness. What does preventive care include? A yearly physical exam. This is also called an annual well check. Dental exams once or twice a year. Routine eye exams. Ask your health care provider how often you should have your eyes checked. Personal lifestyle choices, including: Daily care of your teeth and gums. Regular physical activity. Eating a healthy diet. Avoiding tobacco and drug use. Limiting alcohol use. Practicing safe sex. Taking low-dose aspirin every day. Taking vitamin and mineral supplements as recommended by your health care provider. What happens during an annual well check? The services and screenings done by your health care provider during your annual well check will depend on your age, overall health, lifestyle risk factors, and family history of disease. Counseling  Your health care provider may ask you questions about your: Alcohol use. Tobacco use. Drug use. Emotional well-being. Home and relationship well-being. Sexual activity. Eating habits. History of falls. Memory and ability to  understand (cognition). Work and work Statistician. Reproductive health. Screening  You may have the following tests or measurements: Height, weight, and BMI. Blood pressure. Lipid and cholesterol levels. These may be checked every 5 years, or more frequently if you are over 102 years old. Skin check. Lung cancer screening. You may have this screening every year starting at age 40 if you have a 30-pack-year history of smoking and currently smoke or have quit within the past 15 years. Fecal occult blood test (FOBT) of the stool. You may have this test every year starting at age 35. Flexible sigmoidoscopy or colonoscopy. You may have a sigmoidoscopy every 5 years or a colonoscopy every 10 years starting at age 77. Hepatitis C blood test. Hepatitis B blood test. Sexually transmitted disease (STD) testing. Diabetes screening. This is done by checking your blood sugar (glucose) after you have not eaten for a while (fasting). You may have this done every 1-3 years. Bone density scan. This is done to screen for osteoporosis. You may have this done starting at age 25. Mammogram. This may be done every 1-2 years. Talk to your health care provider about how often you should have regular mammograms. Talk with your health care provider about your test results, treatment options, and if necessary, the need for more tests. Vaccines  Your health care provider may recommend certain vaccines, such as: Influenza vaccine. This is recommended every year. Tetanus, diphtheria, and acellular pertussis (Tdap, Td) vaccine. You may need a Td booster every 10 years. Zoster vaccine. You may need this after age 59. Pneumococcal 13-valent conjugate (PCV13) vaccine. One dose is recommended after age 3. Pneumococcal polysaccharide (PPSV23) vaccine. One dose is recommended after age 62. Talk to your health care provider about which screenings and  vaccines you need and how often you need them. This information is not  intended to replace advice given to you by your health care provider. Make sure you discuss any questions you have with your health care provider. Document Released: 06/06/2015 Document Revised: 01/28/2016 Document Reviewed: 03/11/2015 Elsevier Interactive Patient Education  2017 [place] Prevention in the Home Falls can cause injuries. They can happen to people of all ages. There are many things you can do to make your home safe and to help prevent falls. What can I do on the outside of my home? Regularly fix the edges of walkways and driveways and fix any cracks. Remove anything that might make you trip as you walk through a door, such as a raised step or threshold. Trim any bushes or trees on the path to your home. Use bright outdoor lighting. Clear any walking paths of anything that might make someone trip, such as rocks or tools. Regularly check to see if handrails are loose or broken. Make sure that both sides of any steps have handrails. Any raised decks and porches should have guardrails on the edges. Have any leaves, snow, or ice cleared regularly. Use sand or salt on walking paths during winter. Clean up any spills in your garage right away. This includes oil or grease spills. What can I do in the bathroom? Use night lights. Install grab bars by the toilet and in the tub and shower. Do not use towel bars as grab bars. Use non-skid mats or decals in the tub or shower. If you need to sit down in the shower, use a plastic, non-slip stool. Keep the floor dry. Clean up any water that spills on the floor as soon as it happens. Remove soap buildup in the tub or shower regularly. Attach bath mats securely with double-sided non-slip rug tape. Do not have throw rugs and other things on the floor that can make you trip. What can I do in the bedroom? Use night lights. Make sure that you have a light by your bed that is easy to reach. Do not use any sheets or blankets that are  too big for your bed. They should not hang down onto the floor. Have a firm chair that has side arms. You can use this for support while you get dressed. Do not have throw rugs and other things on the floor that can make you trip. What can I do in the kitchen? Clean up any spills right away. Avoid walking on wet floors. Keep items that you use a lot in easy-to-reach places. If you need to reach something above you, use a strong step stool that has a grab bar. Keep electrical cords out of the way. Do not use floor polish or wax that makes floors slippery. If you must use wax, use non-skid floor wax. Do not have throw rugs and other things on the floor that can make you trip. What can I do with my stairs? Do not leave any items on the stairs. Make sure that there are handrails on both sides of the stairs and use them. Fix handrails that are broken or loose. Make sure that handrails are as long as the stairways. Check any carpeting to make sure that it is firmly attached to the stairs. Fix any carpet that is loose or worn. Avoid having throw rugs at the top or bottom of the stairs. If you do have throw rugs, attach them to the floor with carpet tape. Make  sure that you have a light switch at the top of the stairs and the bottom of the stairs. If you do not have them, ask someone to add them for you. What else can I do to help prevent falls? Wear shoes that: Do not have high heels. Have rubber bottoms. Are comfortable and fit you well. Are closed at the toe. Do not wear sandals. If you use a stepladder: Make sure that it is fully opened. Do not climb a closed stepladder. Make sure that both sides of the stepladder are locked into place. Ask someone to hold it for you, if possible. Clearly mark and make sure that you can see: Any grab bars or handrails. First and last steps. Where the edge of each step is. Use tools that help you move around (mobility aids) if they are needed. These  include: Canes. Walkers. Scooters. Crutches. Turn on the lights when you go into a dark area. Replace any light bulbs as soon as they burn out. Set up your furniture so you have a clear path. Avoid moving your furniture around. If any of your floors are uneven, fix them. If there are any pets around you, be aware of where they are. Review your medicines with your doctor. Some medicines can make you feel dizzy. This can increase your chance of falling. Ask your doctor what other things that you can do to help prevent falls. This information is not intended to replace advice given to you by your health care provider. Make sure you discuss any questions you have with your health care provider. Document Released: 03/06/2009 Document Revised: 10/16/2015 Document Reviewed: 06/14/2014 Elsevier Interactive Patient Education  2017 [employer].

## 2021-01-15 NOTE — Progress Notes (Signed)
Subjective:   [name] is a 87 y.o. female who presents for Medicare Annual (Subsequent) preventive examination.  Review of Systems     Cardiac Risk Factors include: advanced age (>34mn, >>58women);diabetes mellitus;sedentary lifestyle     Objective:    Today's Vitals   01/15/21 1406  Weight: 254 lb 3.2 oz (115.3 kg)   Body mass index is 41.03 kg/m.  Advanced Directives 01/15/2021 01/06/2021 12/15/2020 09/26/2020 08/26/2020 07/24/2020 07/10/2020  Does Patient Have a Medical Advance Directive? - Yes Yes Yes Yes Yes Yes  Type of Advance Directive Living will;Healthcare Power of Attorney;Out of facility DNR (pink MOST or yellow form) [place] (pink [place]OST or yellow form) HT[place] of [place] DNR (pink MOST or yellow form) [place]ert[place] of [place]NR (pink MOST or yellow form) [place]Thompson's StationOut of facility DNR (pink MOST or yellow form) HFosstonOut of facility DNR (pink MOST or yellow form) H[place]ermont[place] of facility DNR (pink MOST or yellow form)  Does patient want to make changes to medical advance directive? - No - Patient declined No - Patient declined No - Patient declined No - Patient declined No - Patient declined No - Patient declined  Copy of H[place] Chart? - Yes - validated most recent copy scanned in chart (See row information) Yes - validated most recent copy scanned in chart (See row information) Yes - validated most recent copy scanned in chart (See row information) Yes - validated most recent copy scanned in chart (See row information) Yes - validated most recent copy scanned in chart (See row information) Yes - validated most recent copy scanned in chart (See row information)  Pre-existing out of facility DNR order (yellow form or pink MOST form) Yellow form placed in chart (order not valid for inpatient use) Yellow form  placed in chart (order not valid for inpatient use) Yellow form placed in chart (order not valid for inpatient use) - - - Yellow form placed in chart (order not valid for inpatient use)    Current Medications (verified) Outpatient Encounter Medications as of 01/15/2021  Medication Sig   cholecalciferol (VITAMIN D) 25 MCG (1000 UNIT) tablet Take 3,000 Units by mouth daily. In the morning between 8-11 am   nebivolol (BYSTOLIC) 10 MG tablet Take 1 tablet (10 mg total) by mouth daily.   acetaminophen (TYLENOL) 500 MG tablet Take 500 mg by mouth every 8 (eight) hours as needed.    aspirin EC 81 MG tablet Take 81 mg by mouth daily.   Carboxymethylcellulose Sodium (ARTIFICIAL TEARS OP) Apply 1 drop to eye as needed.   Chlorhexidine Gluconate 4 % SOLN Apply 1 application topically daily. On Tuesday or Friday   donepezil (ARICEPT) 10 MG tablet Take 1 tablet (10 mg total) by mouth at bedtime.   DULoxetine (CYMBALTA) 60 MG capsule Take 60 mg by mouth daily.   furosemide (LASIX) 40 MG tablet Take 40 mg by mouth daily. And prn edema   levothyroxine (SYNTHROID, LEVOTHROID) 25 MCG tablet Take 25 mcg by mouth daily before breakfast.    LORazepam (ATIVAN) 0.5 MG tablet Take 0.5 tablets (0.25 mg total) by mouth at bedtime.   losartan (COZAAR) 100 MG tablet Take 100 mg by mouth daily.   miconazole (MICOTIN) 2 % cream Apply 1 application topically as needed (Apply to rash under breasts and abdominal folds as needed).   mineral oil-hydrophilic petrolatum (AQUAPHOR)  ointment Apply topically as needed for dry skin.   Multiple Vitamins-Minerals (PRESERVISION AREDS 2+MULTI VIT PO) Take by mouth 2 (two) times daily.   nitroGLYCERIN (NITROSTAT) 0.4 MG SL tablet Place 0.4 mg under the tongue every 5 (five) minutes as needed.   potassium chloride SA (K-DUR,KLOR-CON) 20 MEQ tablet Take 20 mEq by mouth daily.    sodium fluoride (PREVIDENT 5000 PLUS) 1.1 % CREA dental cream Place 1 application onto teeth every evening.    UNABLE TO FIND CPAP Machine   vitamin B-12 (CYANOCOBALAMIN) 1000 MCG tablet Take 1,000 mcg by mouth daily.   No facility-administered encounter medications on file as of 01/15/2021.    Allergies (verified) Chlorpheniramine, Codeine, Nystatin, and Amlodipine   History: Past Medical History:  Diagnosis Date   Breast cancer ([place])    Colon polyps    Coronary artery disease 06/24/2006   STEMI inferior with BMS to mid RCA with mid basal inferior wall HK and luminal irrgularities elsewhere   Fracture of femoral neck, right ([place]) 06/13/2011   Fracture of humeral head, right, closed 06/13/2011   GERD (gastroesophageal reflux disease)    Hemorrhoids    Hyperlipemia    Hypertension    LBBB (left bundle branch block)    Macular degeneration    Myocardial infarction ([place])    Obstructive sleep apnea on CPAP    Osteopenia    Osteopenia    Pacemaker 02/25/2014   Rhinitis    Rotator cuff syndrome    SAH (subarachnoid hemorrhage) ([place]) 02/22/2014   [place] attack 02/22/2014   Ulnar neuropathy    Umbilical hernia    Unstable gait    Wears hearing aid    Per [facility] records   Past Surgical History:  Procedure Laterality Date   BREAST LUMPECTOMY     CATARACT EXTRACTION, BILATERAL     Per [facility]   CORONARY ANGIOPLASTY WITH STENT PLACEMENT  06/2006   Mid RCA BMS   HIP ARTHROPLASTY  06/06/2011   Procedure: ARTHROPLASTY BIPOLAR HIP;  Surgeon: Gearlean Alf;  Location: [facility] ORS;  Service: Orthopedics;  Laterality: Right;   PERMANENT PACEMAKER INSERTION N/A 02/25/2014   Procedure: PERMANENT PACEMAKER INSERTION;  Surgeon: Evans Lance, MD;  Location: [facility] CATH LAB;  Service: Cardiovascular;  Laterality: N/A;   SHOULDER SURGERY     TONSILLECTOMY     Per [facility]    WRIST SURGERY     Family History  Problem Relation Age of Onset   Heart attack Father 10   CAD Father    CVA Sister    Social History   Socioeconomic History   Marital status: Married     Spouse name: Not on file   Number of children: Not on file   Years of education: Not on file   Highest education level: Not on file  Occupational History   Not on file  Tobacco Use   Smoking status: Never   Smokeless tobacco: Never  Vaping Use   Vaping Use: Never used  Substance and Sexual Activity   Alcohol use: Yes    Alcohol/week: 0.0 standard drinks    Comment: occasional wine   Drug use: Never   Sexual activity: Not on file  Other Topics Concern   Not on file  Social History Narrative   Not on file   Social Determinants of Health   Financial Resource Strain: Not on file  Food Insecurity: Not on file  Transportation Needs: Not on file  Physical Activity: Not  on file  Stress: Not on file  Social Connections: Not on file    Tobacco Counseling Counseling given: Not Answered   Clinical Intake:     Pain : No/denies pain     BMI - recorded: 40 Nutritional Status: BMI > 30  Obese Nutritional Risks: None Diabetes: Yes CBG done?: No  How often do you need to have someone help you when you read instructions, pamphlets, or other written materials from your doctor or pharmacy?: 5 - Always  Diabetic NO  Interpreter Needed?: No  Information entered by :: Royal Hawthorn NP   Activities of Daily Living In your present state of health, do you have any difficulty performing the following activities: 01/15/2021  Hearing? Y  Vision? Y  Difficulty concentrating or making decisions? Y  Walking or climbing stairs? Y  Dressing or bathing? Y  Doing errands, shopping? Y  Preparing Food and eating ? Y  Using the Toilet? Y  In the past six months, have you accidently leaked urine? Y  Do you have problems with loss of bowel control? Y  Managing your Medications? Y  Some recent data might be hidden    Patient Care Team: Virgie Dad, MD as PCP - General (Internal Medicine) Sueanne Margarita, MD as PCP - Sleep Medicine (Sleep Medicine) Evans Lance, MD as PCP  - Cardiology (Cardiology) Justice Britain, MD as Consulting Physician (Orthopedic Surgery) Gaynelle Arabian, MD as Consulting Physician (Orthopedic Surgery) Garvin Fila, MD as Consulting Physician (Neurology) Evans Lance, MD as Consulting Physician (Cardiology) Jettie Booze, MD as Consulting Physician (Interventional Cardiology) Shon Hough, MD as Consulting Physician (Ophthalmology) Community[facility] ([place]) Royal Hawthorn, NP as Nurse Practitioner (Nurse Practitioner)  Indicate any recent Medical Services you may have received from other than [facility] providers in the past year (date may be approximate).     Assessment:   This is a routine wellness examination for [name].  Hearing/Vision screen No results found.  Dietary issues and exercise activities discussed: Current Exercise Habits: Structured exercise class, Frequency (Times/Week): 3, Intensity: Mild, Exercise limited by: orthopedic condition(s)   Goals Addressed             This Visit's Progress    DIET - REDUCE CALORIE INTAKE         Depression Screen PHQ 2/9 Scores 01/15/2021 07/04/2019 12/21/2018 10/18/2018 06/14/2018 02/08/2018 02/02/2018  PHQ - 2 Score 0 0 1 0 0 0 0    Fall Risk Fall Risk  01/15/2021 07/04/2019 12/21/2018 10/18/2018 06/14/2018  Falls in the past year? - 0 0 0 0  Number falls in past yr: 0 0 0 0 0  Injury with Fall? - 0 0 0 0  Risk Factor Category  - - - - -  Risk for fall due to : History of fall(s) - Impaired mobility - -  Follow up Falls evaluation completed;Follow up appointment - Falls evaluation completed;Falls prevention discussed - -    FALL RISK PREVENTION PERTAINING TO THE HOME:  Any stairs in or around the home? No  If so, are there any without handrails? Yes  Home free of loose throw rugs in walkways, pet beds, electrical cords, etc? Yes  Adequate lighting in your home to reduce risk of falls? Yes   ASSISTIVE DEVICES UTILIZED TO  PREVENT FALLS:  Life alert? No  Use of a cane, walker or w/c? Yes  Grab bars in the bathroom? Yes  Shower chair or bench in shower?  Yes  Elevated toilet seat or a handicapped toilet? Yes   TIMED UP AND GO:  Was the test performed? No .  Length of time to ambulate 10 feet: not able to walk  Gait unsteady with use of assistive device, provider informed and education provided.   Cognitive Function: MMSE - Mini Mental State Exam 12/21/2018 07/30/2017  Orientation to time 4 5  Orientation to Place 5 5  Registration 3 3  Attention/ Calculation 5 5  Recall 2 3  Language- name 2 objects 2 2  Language- repeat 1 1  Language- follow 3 step command 3 3  Language- read & follow direction 1 1  Write a sentence 1 1  Copy design 1 1  Total score 28 30   MMSE - Mini Mental State Exam 12/21/2018 07/30/2017  Orientation to time 4 5  Orientation to Place 5 5  Registration 3 3  Attention/ Calculation 5 5  Recall 2 3  Language- name 2 objects 2 2  Language- repeat 1 1  Language- follow 3 step command 3 3  Language- read & follow direction 1 1  Write a sentence 1 1  Copy design 1 1  Total score 28 30     6CIT Screen 01/15/2021  What Year? 0 points  What month? 0 points  What time? 0 points  Count back from 20 0 points  Months in reverse 0 points  Repeat phrase 0 points  Total Score 0            Immunizations Immunization History  Administered Date(s) Administered   Influenza, High Dose Seasonal PF 02/18/2017, 03/22/2019, 03/14/2020   Influenza,inj,Quad PF,6+ Mos 03/24/2018   Influenza-Unspecified 03/04/2016, 03/18/2020   Moderna SARS-COV2 Booster Vaccination 04/03/2020   Moderna Sars-Covid-2 Vaccination 06/04/2019, 07/03/2019   Pneumococcal Conjugate-13 05/08/2014   Pneumococcal Polysaccharide-23 10/08/2017   Td 07/09/2002, 01/16/2013   Zoster Recombinat (Shingrix) 11/18/2017, 01/17/2018   Zoster, Live 06/30/2010    TDAP status: Up to date  Flu Vaccine status: Up to  date  Pneumococcal vaccine status: Up to date  Covid-19 vaccine status: Completed vaccines  Qualifies for Shingles Vaccine? Yes   Zostavax completed Yes   Shingrix Completed?: Yes  Screening Tests Health Maintenance  Topic Date Due   COVID-19 Vaccine (4 - Booster for Moderna series) 08/01/2020   INFLUENZA VACCINE  12/22/2020   TETANUS/TDAP  01/17/2023   DEXA SCAN  Completed   PNA vac Low Risk Adult  Completed   Zoster Vaccines- Shingrix  Completed   HPV VACCINES  Aged Out    Health Maintenance  Health Maintenance Due  Topic Date Due   COVID-19 Vaccine (4 - Booster for Moderna series) 08/01/2020   INFLUENZA VACCINE  12/22/2020    Colorectal cancer screening: No longer required.   Mammogram status: No longer required due to age. Dexa scan not required not required due to non ambulatory status   Lung Cancer Screening: (Low Dose CT Chest recommended if Age 33-80 years, 30 pack-year currently smoking OR have quit w/in 15years.) does not qualify.   Lung Cancer Screening Referral: NA  Additional Screening:  Hepatitis C Screening: does not qualify; not Completed   Vision Screening: Recommended annual ophthalmology exams for early detection of glaucoma and other disorders of the eye. Is the patient up to date with their annual eye exam?  No  Who is the provider or what is the name of the office in which the patient attends annual eye exams? NA If pt is not  established with a provider, would they like to be referred to a provider to establish care? No .   Dental Screening: Recommended annual dental exams for proper oral hygiene  Community Resource Referral / Chronic Care Management: CRR required this visit?  No   CCM required this visit?  No      Plan:     I have personally reviewed and noted the following in the patient's chart:   Medical and social history Use of alcohol, tobacco or illicit drugs  Current medications and supplements including opioid  prescriptions.  Functional ability and status Nutritional status Physical activity Advanced directives List of other physicians Hospitalizations, surgeries, and ER visits in previous 12 months Vitals Screenings to include cognitive, depression, and falls Referrals and appointments  In addition, I have reviewed and discussed with patient certain preventive protocols, quality metrics, and best practice recommendations. A written personalized care plan for preventive services as well as general preventive health recommendations were provided to patient.     Royal Hawthorn, NP   01/15/2021   Nurse Notes: NA

## 2021-02-05 ENCOUNTER — Encounter: Payer: Self-pay | Admitting: Adult Health

## 2021-02-05 ENCOUNTER — Non-Acute Institutional Stay (SKILLED_NURSING_FACILITY): Payer: Medicare Other | Admitting: Adult Health

## 2021-02-05 DIAGNOSIS — F0150 Vascular dementia without behavioral disturbance: Secondary | ICD-10-CM | POA: Diagnosis not present

## 2021-02-05 DIAGNOSIS — N63 Unspecified lump in unspecified breast: Secondary | ICD-10-CM | POA: Diagnosis not present

## 2021-02-05 DIAGNOSIS — I10 Essential (primary) hypertension: Secondary | ICD-10-CM | POA: Diagnosis not present

## 2021-02-05 DIAGNOSIS — Z950 Presence of cardiac pacemaker: Secondary | ICD-10-CM

## 2021-02-05 DIAGNOSIS — I872 Venous insufficiency (chronic) (peripheral): Secondary | ICD-10-CM

## 2021-02-05 DIAGNOSIS — I5032 Chronic diastolic (congestive) heart failure: Secondary | ICD-10-CM

## 2021-02-05 DIAGNOSIS — E039 Hypothyroidism, unspecified: Secondary | ICD-10-CM | POA: Diagnosis not present

## 2021-02-05 NOTE — Progress Notes (Signed)
Location:  [facility] Room Number: [id number] Place of Service:  SNF [phone]) Provider:  Royal Hawthorn, NP   Patient Care Team: Virgie Dad, MD as PCP - General (Internal Medicine) Sueanne Margarita, MD as PCP - Sleep Medicine (Sleep Medicine) Evans Lance, MD as PCP - Cardiology (Cardiology) Justice Britain, MD as Consulting Physician (Orthopedic Surgery) Gaynelle Arabian, MD as Consulting Physician (Orthopedic Surgery) Garvin Fila, MD as Consulting Physician (Neurology) Evans Lance, MD as Consulting Physician (Cardiology) Jettie Booze, MD as Consulting Physician (Interventional Cardiology) Shon Hough, MD as Consulting Physician (Ophthalmology) Community, [facility] ([place]) Royal Hawthorn, NP as Nurse Practitioner (Nurse Practitioner)  Extended Emergency Contact Information Primary Emergency Contact: Ehrsam,JED Address: [place], [place] Phone: [phone] Mobile Phone: [phone] Relation: Son Secondary Emergency Contact: [place] Mobile Phone: [phone] Relation: Daughter  Code Status:  DNR Goals of care: Advanced Directive information Advanced Directives 02/05/2021  Does Patient Have a Medical Advance Directive? Yes  Type of Advance Directive Living will;Out of facility DNR (pink MOST or yellow form)  Does patient want to make changes to medical advance directive? No - Patient declined  Copy of [place] in Chart? -  Pre-existing out of facility DNR order (yellow form or pink MOST form) Yellow form placed in chart (order not valid for inpatient use)     Chief Complaint  Patient presents with   Medical Management of Chronic Issues    Routine visit. Discuss need for flu vaccine or exclude     HPI:  Pt is a 87 y.o. female seen today for medical management of chronic diseases.    PMH significant for breast  ca, CAD with stent, subarachnoid hemorrhage, dementia, incontinence, obesity, OP, HTN, diastolic CHF, GERD, pacemaker, among others. Has a hx of breast lump as well that her family declined mammogram for due to her age and dementia.   She went to the dentist today and is sleepy. Has no acute complaints. Spends most of the day in the [facility] and remains pleasant and can participate in activities.   Weight is above goal, pt likes to snack and eat sweets.  Wt Readings from Last 3 Encounters:  02/05/21 254 lb 6.4 oz (115.4 kg)  01/15/21 254 lb 3.2 oz (115.3 kg)  01/06/21 255 lb (115.7 kg)   Her husband died this year and she remains on ativan to help with anxiety and sleep. No reports of crying, insomnia, or anxiety  Has chronic lower ext edema with stasis dermatitis that is unchanged. Wears compression hose   Past Medical History:  Diagnosis Date   Breast cancer ([place])    Colon polyps    Coronary artery disease 06/24/2006   STEMI inferior with BMS to mid RCA with mid basal inferior wall HK and luminal irrgularities elsewhere   Fracture of femoral neck, right ([place]) 06/13/2011   Fracture of humeral head, right, closed 06/13/2011   GERD (gastroesophageal reflux disease)    Hemorrhoids    Hyperlipemia    Hypertension    LBBB (left bundle branch block)    Macular degeneration    Myocardial infarction ([place])    Obstructive sleep apnea on CPAP    Osteopenia    Osteopenia    Pacemaker 02/25/2014   Rhinitis    Rotator cuff syndrome    SAH (subarachnoid hemorrhage) ([place]) 02/22/2014   Stokes Adams attack  02/22/2014   Ulnar neuropathy    Umbilical hernia    Unstable gait    Wears hearing aid    Per [facility] records   Past Surgical History:  Procedure Laterality Date   BREAST LUMPECTOMY     CATARACT EXTRACTION, BILATERAL     Per [facility]   CORONARY ANGIOPLASTY WITH STENT PLACEMENT  06/2006   Mid RCA BMS   HIP ARTHROPLASTY  06/06/2011   Procedure: ARTHROPLASTY BIPOLAR HIP;   Surgeon: Gearlean Alf;  Location: [facility] ORS;  Service: Orthopedics;  Laterality: Right;   PERMANENT PACEMAKER INSERTION N/A 02/25/2014   Procedure: PERMANENT PACEMAKER INSERTION;  Surgeon: Evans Lance, MD;  Location: [facility] CATH LAB;  Service: Cardiovascular;  Laterality: N/A;   SHOULDER SURGERY     TONSILLECTOMY     Per [facility]    WRIST SURGERY      Allergies  Allergen Reactions   Chlorpheniramine Anaphylaxis   Codeine Anaphylaxis   Nystatin Rash    redness   Amlodipine     Per [facility]     Outpatient Encounter Medications as of 02/05/2021  Medication Sig   acetaminophen (TYLENOL) 500 MG tablet Take 500 mg by mouth every 8 (eight) hours as needed.    aspirin EC 81 MG tablet Take 81 mg by mouth daily.   Carboxymethylcellulose Sodium (ARTIFICIAL TEARS OP) Apply 1 drop to eye as needed.   Chlorhexidine Gluconate 4 % SOLN Apply 1 application topically daily. On Tuesday or Friday   cholecalciferol (VITAMIN D) 25 MCG (1000 UNIT) tablet Take 3,000 Units by mouth daily. In the morning between 8-11 am   donepezil (ARICEPT) 10 MG tablet Take 1 tablet (10 mg total) by mouth at bedtime.   DULoxetine (CYMBALTA) 60 MG capsule Take 60 mg by mouth daily.   furosemide (LASIX) 40 MG tablet Take 40 mg by mouth daily. And prn edema   levothyroxine (SYNTHROID, LEVOTHROID) 25 MCG tablet Take 25 mcg by mouth daily before breakfast.    LORazepam (ATIVAN) 0.5 MG tablet Take 0.5 tablets (0.25 mg total) by mouth at bedtime.   losartan (COZAAR) 100 MG tablet Take 100 mg by mouth daily.   miconazole (MICOTIN) 2 % cream Apply 1 application topically as needed (Apply to rash under breasts and abdominal folds as needed).   mineral oil-hydrophilic petrolatum (AQUAPHOR) ointment Apply topically as needed for dry skin.   Multiple Vitamins-Minerals (PRESERVISION AREDS 2+MULTI VIT PO) Take by mouth 2 (two) times daily.   nebivolol (BYSTOLIC) 10 MG tablet Take 1 tablet (10 mg total) by  mouth daily.   nitroGLYCERIN (NITROSTAT) 0.4 MG SL tablet Place 0.4 mg under the tongue every 5 (five) minutes as needed.   potassium chloride SA (K-DUR,KLOR-CON) 20 MEQ tablet Take 20 mEq by mouth daily.    sodium fluoride (PREVIDENT 5000 PLUS) 1.1 % CREA dental cream Place 1 application onto teeth every evening.   UNABLE TO FIND CPAP Machine   vitamin B-12 (CYANOCOBALAMIN) 1000 MCG tablet Take 1,000 mcg by mouth daily.   No facility-administered encounter medications on file as of 02/05/2021.    Review of Systems  Constitutional:  Negative for activity change, appetite change, chills, diaphoresis, fatigue, fever and unexpected weight change.  HENT:  Negative for congestion.   Respiratory:  Negative for cough, shortness of breath and wheezing.   Cardiovascular:  Positive for leg swelling. Negative for chest pain and palpitations.  Gastrointestinal:  Negative for abdominal distention, abdominal pain, constipation and diarrhea.  Genitourinary:  Negative for difficulty urinating and dysuria.  Musculoskeletal:  Positive for arthralgias and gait problem. Negative for back pain, joint swelling and myalgias.  Neurological:  Negative for dizziness, tremors, seizures, syncope, facial asymmetry, speech difficulty, weakness, light-headedness, numbness and headaches.  Psychiatric/Behavioral:  Positive for confusion. Negative for agitation, behavioral problems and sleep disturbance. The patient is not nervous/anxious.    Immunization History  Administered Date(s) Administered   Influenza, High Dose Seasonal PF 02/18/2017, 03/22/2019, 03/14/2020   Influenza,inj,Quad PF,6+ Mos 03/24/2018   Influenza-Unspecified 03/04/2016, 03/18/2020   Moderna SARS-COV2 Booster Vaccination 04/03/2020, 01/13/2021   Moderna Sars-Covid-2 Vaccination 06/04/2019, 07/03/2019   Pneumococcal Conjugate-13 05/08/2014   Pneumococcal Polysaccharide-23 10/08/2017   Td 07/09/2002, 01/16/2013   Zoster Recombinat (Shingrix)  11/18/2017, 01/17/2018   Zoster, Live 06/30/2010   Pertinent  Health Maintenance Due  Topic Date Due   INFLUENZA VACCINE  12/22/2020   DEXA SCAN  Completed   PNA vac Low Risk Adult  Completed   Fall Risk  01/15/2021 07/04/2019 12/21/2018 10/18/2018 06/14/2018  Falls in the past year? - 0 0 0 0  Number falls in past yr: 0 0 0 0 0  Injury with Fall? - 0 0 0 0  Risk Factor Category  - - - - -  Risk for fall due to : History of fall(s) - Impaired mobility - -  Follow up Falls evaluation completed;Follow up appointment - Falls evaluation completed;Falls prevention discussed - -   Functional Status Survey:    Vitals:   02/05/21 1056  BP: (!) 142/70  Pulse: 64  Resp: 18  Temp: (!) 97 F (36.1 C)  SpO2: 96%  Weight: 254 lb 6.4 oz (115.4 kg)  Height: '5\' 6"'$  (1.676 m)   Body mass index is 41.06 kg/m. Physical Exam Vitals and nursing note reviewed.  Constitutional:      General: She is not in acute distress.    Appearance: She is not diaphoretic.  HENT:     Head: Normocephalic and atraumatic.     Mouth/Throat:     Mouth: Mucous membranes are moist.     Pharynx: Oropharynx is clear.  Neck:     Vascular: No JVD.  Cardiovascular:     Rate and Rhythm: Normal rate and regular rhythm.     Heart sounds: No murmur heard. Pulmonary:     Effort: Pulmonary effort is normal. No respiratory distress.     Breath sounds: Normal breath sounds. No wheezing.  Abdominal:     General: Bowel sounds are normal. There is no distension.     Palpations: Abdomen is soft.     Tenderness: There is no abdominal tenderness.  Musculoskeletal:     Right lower leg: Edema present.     Left lower leg: Edema present.  Skin:    General: Skin is warm and dry.  Neurological:     General: No focal deficit present.     Mental Status: She is alert and oriented to person, place, and time. Mental status is at baseline.  Psychiatric:        Mood and Affect: Mood normal.    Labs reviewed: Recent Labs     09/29/20 0000  NA 141  K 4.6  CL 101  CO2 26*  BUN 14  CREATININE 0.9   No results for input(s): AST, ALT, ALKPHOS, BILITOT, PROT, ALBUMIN in the last 8760 hours. Recent Labs    09/29/20 0000  WBC 7.2  HGB 12.6  HCT 38  PLT 246  Lab Results  Component Value Date   TSH 3.96 09/29/2020   Lab Results  Component Value Date   HGBA1C 6.1 03/01/2019   Lab Results  Component Value Date   CHOL 200 10/18/2019   HDL 44 10/18/2019   LDLCALC 129 10/18/2019   TRIG 137 10/18/2019    Significant Diagnostic Results in last 30 days:  No results found.  Assessment/Plan  1. Breast lump On further work up due to her goals of care No weight loss or pain noted.   2. Primary hypertension Controlled Goal <150/90  3. Acquired hypothyroidism Lab Results  Component Value Date   TSH 3.96 09/29/2020   Continue Synthroid  4. Vascular dementia without behavioral disturbance (HCC) Moderate Progressive decline in cognition and physical function c/w the disease. Continue supportive care in the skilled environment. Continue Aricept  5. Pacemaker Due to CHB Pacemaker check 11/11/20  6. Venous insufficiency Continue lasix 40 mg qd and compression hose  7. Chronic diastolic heart failure (HCC) Compensated on diuretic.    Family/ staff Communication: nurse  Labs/tests ordered:  NA

## 2021-02-10 ENCOUNTER — Ambulatory Visit (INDEPENDENT_AMBULATORY_CARE_PROVIDER_SITE_OTHER): Payer: Medicare Other

## 2021-02-10 DIAGNOSIS — I4430 Unspecified atrioventricular block: Secondary | ICD-10-CM | POA: Diagnosis not present

## 2021-02-11 LAB — CUP PACEART REMOTE DEVICE CHECK
Battery Remaining Longevity: 37 mo
Battery Remaining Percentage: 33 %
Battery Voltage: 2.92 V
Brady Statistic AP VP Percent: 70 %
Brady Statistic AP VS Percent: 1 %
Brady Statistic AS VP Percent: 28 %
Brady Statistic AS VS Percent: 1 %
Brady Statistic RA Percent Paced: 69 %
Brady Statistic RV Percent Paced: 98 %
Date Time Interrogation Session: 20220921011159
Implantable Lead Implant Date: 20151005
Implantable Lead Implant Date: 20151005
Implantable Lead Location: 753859
Implantable Lead Location: 753860
Implantable Pulse Generator Implant Date: 20151005
Lead Channel Impedance Value: 410 Ohm
Lead Channel Impedance Value: 590 Ohm
Lead Channel Pacing Threshold Amplitude: 0.875 V
Lead Channel Pacing Threshold Amplitude: 1 V
Lead Channel Pacing Threshold Pulse Width: 0.5 ms
Lead Channel Pacing Threshold Pulse Width: 0.5 ms
Lead Channel Sensing Intrinsic Amplitude: 12 mV
Lead Channel Sensing Intrinsic Amplitude: 4.9 mV
Lead Channel Setting Pacing Amplitude: 1.125
Lead Channel Setting Pacing Amplitude: 2 V
Lead Channel Setting Pacing Pulse Width: 0.5 ms
Lead Channel Setting Sensing Sensitivity: 4 mV
Pulse Gen Model: 2240
Pulse Gen Serial Number: 7666288

## 2021-02-17 NOTE — Progress Notes (Signed)
Remote pacemaker transmission.   

## 2021-02-25 DIAGNOSIS — Z23 Encounter for immunization: Secondary | ICD-10-CM | POA: Diagnosis not present

## 2021-03-04 DIAGNOSIS — Z23 Encounter for immunization: Secondary | ICD-10-CM | POA: Diagnosis not present

## 2021-03-30 ENCOUNTER — Non-Acute Institutional Stay (SKILLED_NURSING_FACILITY): Payer: Medicare Other | Admitting: Internal Medicine

## 2021-03-30 ENCOUNTER — Encounter: Payer: Self-pay | Admitting: Internal Medicine

## 2021-03-30 DIAGNOSIS — Z9989 Dependence on other enabling machines and devices: Secondary | ICD-10-CM | POA: Diagnosis not present

## 2021-03-30 DIAGNOSIS — F3341 Major depressive disorder, recurrent, in partial remission: Secondary | ICD-10-CM | POA: Diagnosis not present

## 2021-03-30 DIAGNOSIS — G250 Essential tremor: Secondary | ICD-10-CM | POA: Diagnosis not present

## 2021-03-30 DIAGNOSIS — I5032 Chronic diastolic (congestive) heart failure: Secondary | ICD-10-CM | POA: Diagnosis not present

## 2021-03-30 DIAGNOSIS — F0150 Vascular dementia without behavioral disturbance: Secondary | ICD-10-CM

## 2021-03-30 DIAGNOSIS — I10 Essential (primary) hypertension: Secondary | ICD-10-CM | POA: Diagnosis not present

## 2021-03-30 DIAGNOSIS — G4733 Obstructive sleep apnea (adult) (pediatric): Secondary | ICD-10-CM

## 2021-03-30 DIAGNOSIS — E039 Hypothyroidism, unspecified: Secondary | ICD-10-CM

## 2021-03-30 NOTE — Progress Notes (Signed)
Location:    [facility] Room Number: 326 Place of Service:  SNF [id number]) Provider:  Veleta Miners MD  Virgie Dad, MD  Patient Care Team: Virgie Dad, MD as PCP - General (Internal Medicine) Sueanne Margarita, MD as PCP - Sleep Medicine (Sleep Medicine) Evans Lance, MD as PCP - Cardiology (Cardiology) Justice Britain, MD as Consulting Physician (Orthopedic Surgery) Gaynelle Arabian, MD as Consulting Physician (Orthopedic Surgery) Garvin Fila, MD as Consulting Physician (Neurology) Evans Lance, MD as Consulting Physician (Cardiology) Jettie Booze, MD as Consulting Physician (Interventional Cardiology) Shon Hough, MD as Consulting Physician (Ophthalmology) Community, [facility] ([place]) Royal Hawthorn, NP as Nurse Practitioner (Nurse Practitioner)  Extended Emergency Contact Information Primary Emergency Contact: Gr[place],JED Address: [place], [place] of [place] Phone: [phone] Mobile Phone: [phone] Relation: Son Secondary Emergency Contact: [place] Mobile Phone: [phone] Relation: Daughter  Code Status:  DNR Goals of care: Advanced Directive information Advanced Directives 03/30/2021  Does Patient Have a Medical Advance Directive? Yes  Type of Advance Directive Living will;Out of facility DNR (pink MOST or yellow form)  Does patient want to make changes to medical advance directive? No - Patient declined  Copy of [place] in Chart? -  Pre-existing out of facility DNR order (yellow form or pink MOST form) Yellow form placed in chart (order not valid for inpatient use)     Chief Complaint  Patient presents with   Medical Management of Chronic Issues    HPI:  Pt is a 87 y.o. female seen today for medical management of chronic diseases.    Patient has h/o Chronic Diastolic CHF, Urinary Incontinence, HTN,  Osteoporosis, HLD, B 12 Def, anemia, H/o  Breast cancer and Vascular Dementia  h/o SAH and OSA Has h/o Breast lump No Work up poer her request Doing well. Was c/o Some hoarseness. Denies Fever Cough or Sore throat Weight stable No New Nursing Issues No Falls Mostly Wheelchair Bound. Answers appropriately  Past Medical History:  Diagnosis Date   Breast cancer ([place])    Colon polyps    Coronary artery disease 06/24/2006   STEMI inferior with BMS to mid RCA with mid basal inferior wall HK and luminal irrgularities elsewhere   Fracture of femoral neck, right ([place]) 06/13/2011   Fracture of humeral head, right, closed 06/13/2011   GERD (gastroesophageal reflux disease)    Hemorrhoids    Hyperlipemia    Hypertension    LBBB (left bundle branch block)    Macular degeneration    Myocardial infarction ([place])    Obstructive sleep apnea on CPAP    Osteopenia    Osteopenia    Pacemaker 02/25/2014   Rhinitis    Rotator cuff syndrome    SAH (subarachnoid hemorrhage) ([place]) 02/22/2014   Stokes Adams attack 02/22/2014   Ulnar neuropathy    Umbilical hernia    Unstable gait    Wears hearing aid    Per [facility] records   Past Surgical History:  Procedure Laterality Date   BREAST LUMPECTOMY     CATARACT EXTRACTION, BILATERAL     Per [facility]   CORONARY ANGIOPLASTY WITH STENT PLACEMENT  06/2006   Mid RCA BMS   HIP ARTHROPLASTY  06/06/2011   Procedure: ARTHROPLASTY BIPOLAR HIP;  Surgeon: Gearlean Alf;  Location: [facility];  Service: Orthopedics;  Laterality: Right;   PERMANENT PACEMAKER  INSERTION N/A 02/25/2014   Procedure: PERMANENT PACEMAKER INSERTION;  Surgeon: Evans Lance, MD;  Location: [facility] CATH LAB;  Service: Cardiovascular;  Laterality: N/A;   SHOULDER SURGERY     TONSILLECTOMY     Per [facility]    WRIST SURGERY      Allergies  Allergen Reactions   Chlorpheniramine Anaphylaxis   Codeine Anaphylaxis   Nystatin Rash    redness   Amlodipine      Per [facility]     Allergies as of 03/30/2021       Reactions   Chlorpheniramine Anaphylaxis   Codeine Anaphylaxis   Nystatin Rash   redness   Amlodipine    Per [facility]         Medication List        Accurate as of March 30, 2021  2:56 PM. If you have any questions, ask your nurse or doctor.          acetaminophen 500 MG tablet Commonly known as: TYLENOL Take 500 mg by mouth every 8 (eight) hours as needed.   ARTIFICIAL TEARS OP Apply 1 drop to eye as needed.   aspirin EC 81 MG tablet Take 81 mg by mouth daily.   Chlorhexidine Gluconate 4 % Soln Apply 1 application topically daily. On Tuesday or Friday   cholecalciferol 25 MCG (1000 UNIT) tablet Commonly known as: VITAMIN D Take 3,000 Units by mouth daily. In the morning between 8-11 am   donepezil 10 MG tablet Commonly known as: Aricept Take 1 tablet (10 mg total) by mouth at bedtime.   DULoxetine 60 MG capsule Commonly known as: CYMBALTA Take 60 mg by mouth daily.   furosemide 40 MG tablet Commonly known as: LASIX Take 40 mg by mouth daily. And prn edema   levothyroxine 25 MCG tablet Commonly known as: SYNTHROID Take 25 mcg by mouth daily before breakfast.   LORazepam 0.5 MG tablet Commonly known as: ATIVAN Take 0.5 tablets (0.25 mg total) by mouth at bedtime.   losartan 100 MG tablet Commonly known as: COZAAR Take 100 mg by mouth daily.   miconazole 2 % cream Commonly known as: MICOTIN Apply 1 application topically as needed (Apply to rash under breasts and abdominal folds as needed).   mineral oil-hydrophilic petrolatum ointment Apply topically as needed for dry skin.   nebivolol 10 MG tablet Commonly known as: Bystolic Take 1 tablet (10 mg total) by mouth daily.   nitroGLYCERIN 0.4 MG SL tablet Commonly known as: NITROSTAT Place 0.4 mg under the tongue every 5 (five) minutes as needed.   potassium chloride SA 20 MEQ tablet Commonly known as:  KLOR-CON Take 20 mEq by mouth daily.   PRESERVISION AREDS 2+MULTI VIT PO Take by mouth 2 (two) times daily.   sodium fluoride 1.1 % Crea dental cream Commonly known as: PREVIDENT 5000 PLUS Place 1 application onto teeth every evening.   UNABLE TO FIND CPAP Machine   vitamin B-12 1000 MCG tablet Commonly known as: CYANOCOBALAMIN Take 1,000 mcg by mouth daily.        Review of Systems  Constitutional: Negative.   HENT:  Positive for voice change. Negative for sore throat.   Respiratory: Negative.    Cardiovascular:  Positive for leg swelling.  Gastrointestinal: Negative.   Genitourinary: Negative.   Musculoskeletal:  Positive for gait problem.  Skin: Negative.   Neurological:  Negative for dizziness.  Psychiatric/Behavioral:  Positive for confusion.    Immunization History  Administered  Date(s) Administered   Influenza, High Dose Seasonal PF 02/18/2017, 03/22/2019, 03/14/2020   Influenza,inj,Quad PF,6+ Mos 03/24/2018   Influenza-Unspecified 03/04/2016, 03/18/2020, 02/25/2021   Moderna SARS-COV2 Booster Vaccination 04/03/2020, 01/13/2021   Moderna Sars-Covid-2 Vaccination 06/04/2019, 07/03/2019, 03/04/2021   Pneumococcal Conjugate-13 05/08/2014   Pneumococcal Polysaccharide-23 10/08/2017   Td 07/09/2002, 01/16/2013   Zoster Recombinat (Shingrix) 11/18/2017, 01/17/2018   Zoster, Live 06/30/2010   Pertinent  Health Maintenance Due  Topic Date Due   INFLUENZA VACCINE  Completed   DEXA SCAN  Completed   Fall Risk 06/14/2018 10/18/2018 12/21/2018 07/04/2019 01/15/2021  Falls in the past year? 0 0 0 0 -  Was there an injury with Fall? 0 0 0 0 -  Fall Risk Category Calculator 0 0 0 0 -  Fall Risk Category Low Low Low Low -  Patient Fall Risk Level - Low fall risk - Low fall risk Moderate fall risk  Patient at Risk for Falls Due to - - Impaired mobility - History of fall(s)  Fall risk Follow up - - Falls evaluation completed;Falls prevention discussed - Falls evaluation  completed;Follow up appointment   Functional Status Survey:    Vitals:   03/30/21 1144  BP: (!) 145/73  Pulse: 68  Resp: 17  Temp: 98 F (36.7 C)  SpO2: 97%  Weight: 252 lb 3.2 oz (114.4 kg)  Height: 5' (1.524 m)   Body mass index is 49.25 kg/m. Physical Exam Vitals reviewed.  Constitutional:      Appearance: Normal appearance.  HENT:     Head: Normocephalic.     Nose: Nose normal.     Mouth/Throat:     Mouth: Mucous membranes are moist.     Pharynx: Oropharynx is clear.  Eyes:     Pupils: Pupils are equal, round, and reactive to light.  Cardiovascular:     Rate and Rhythm: Normal rate and regular rhythm.     Pulses: Normal pulses.  Pulmonary:     Effort: Pulmonary effort is normal.     Breath sounds: Normal breath sounds.  Abdominal:     General: Abdomen is flat. Bowel sounds are normal.     Palpations: Abdomen is soft.  Musculoskeletal:        General: Swelling present.     Cervical back: Neck supple.     Comments: Moderate Swelling bilateral  Skin:    General: Skin is warm.  Neurological:     General: No focal deficit present.     Mental Status: She is alert.  Psychiatric:        Mood and Affect: Mood normal.        Thought Content: Thought content normal.    Labs reviewed: Recent Labs    09/29/20 0000  NA 141  K 4.6  CL 101  CO2 26*  BUN 14  CREATININE 0.9   No results for input(s): AST, ALT, ALKPHOS, BILITOT, PROT, ALBUMIN in the last 8760 hours. Recent Labs    09/29/20 0000  WBC 7.2  HGB 12.6  HCT 38  PLT 246   Lab Results  Component Value Date   TSH 3.96 09/29/2020   Lab Results  Component Value Date   HGBA1C 6.1 03/01/2019   Lab Results  Component Value Date   CHOL 200 10/18/2019   HDL 44 10/18/2019   LDLCALC 129 10/18/2019   TRIG 137 10/18/2019    Significant Diagnostic Results in last 30 days:  No results found.  Assessment/Plan Primary hypertension On Losartan Bystolic  Acquired  hypothyroidism TSH normal in  5/22 Vascular dementia without behavioral disturbance (HCC) Supportive care On Aricept and Aspirin Not on statin Chronic diastolic heart failure (HCC) On Lasix Creat stable Obstructive sleep apnea on CPAP  Depression, major, recurrent, in partial remission (HCC) Continue Cymbalta Ativan PRN  Essential tremor B 12 def Continue Supplement   Family/ staff Communication:   Labs/tests ordered:

## 2021-04-13 ENCOUNTER — Non-Acute Institutional Stay (SKILLED_NURSING_FACILITY): Payer: Medicare Other | Admitting: Adult Health

## 2021-04-13 ENCOUNTER — Encounter: Payer: Self-pay | Admitting: Adult Health

## 2021-04-13 DIAGNOSIS — H1013 Acute atopic conjunctivitis, bilateral: Secondary | ICD-10-CM | POA: Diagnosis not present

## 2021-04-13 DIAGNOSIS — W19XXXA Unspecified fall, initial encounter: Secondary | ICD-10-CM | POA: Diagnosis not present

## 2021-04-13 DIAGNOSIS — I872 Venous insufficiency (chronic) (peripheral): Secondary | ICD-10-CM

## 2021-04-13 MED ORDER — LORATADINE 10 MG PO TABS: 10.0000 mg | ORAL_TABLET | Freq: Every day | ORAL | Status: DC

## 2021-04-13 NOTE — Progress Notes (Signed)
Location:  [facility]up[facility] psychologist of Service:  SNF (31) Provider:   Cindi Carbon, [place] [phone]   Virgie Dad, MD  Patient Care Team: Virgie Dad, MD as PCP - General (Internal Medicine) Sueanne Margarita, MD as PCP - Sleep Medicine (Sleep Medicine) Evans Lance, MD as PCP - Cardiology (Cardiology) Justice Britain, MD as Consulting Physician (Orthopedic Surgery) Gaynelle Arabian, MD as Consulting Physician (Orthopedic Surgery) Garvin Fila, MD as Consulting Physician (Neurology) Evans Lance, MD as Consulting Physician (Cardiology) Jettie Booze, MD as Consulting Physician (Interventional Cardiology) Shon Hough, MD as Consulting Physician (Ophthalmology) Community, [facility] ([employer]) Royal Hawthorn, NP as Nurse Practitioner (Nurse Practitioner)  Extended Emergency Contact Information Primary Emergency Contact: Housey,JED Address: [place], [place] Phone: [phone] Mobile Phone: [phone] Relation: Son Secondary Emergency Contact: [place] Mobile Phone: [phone] Relation: Daughter  Code Status:  DNR Goals of care: Advanced Directive information Advanced Directives 03/30/2021  Does Patient Have a Medical Advance Directive? Yes  Type of Advance Directive Living will;Out of facility DNR (pink MOST or yellow form)  Does patient want to make changes to medical advance directive? No - Patient declined  Copy of [place] in Chart? -  Pre-existing out of facility DNR order (yellow form or pink MOST form) Yellow form placed in chart (order not valid for inpatient use)     Chief Complaint  Patient presents with   Acute Visit    Fall, leg swelling, eyes are itchy     HPI:  Pt is a 87 y.o. female seen today for an acute visit for the above complaints. She fell on 11/20 out of her chair. She  hit the back of her head. No injury was noted and she did not sustain an injury. The nurse asked that I look at her legs due to swelling and redness. They are chronically this way due to venous stasis with dermatitis. The nurse on shift today does not feel they are any different. She is taking lasix daily and wears compression hose. No change in weight, sob, or doe. Prior to her fall she had been given 2 mg of ativan for teeth cleaning and had been sleepy off and on since then. BP is slightly elevated for this visit today but other numbers are WNL.  She is also reporting itchy red eyes bilaterally. No fever, cough, or drainage.    Past Medical History:  Diagnosis Date   Breast cancer ([place])    Colon polyps    Coronary artery disease 06/24/2006   STEMI inferior with BMS to mid RCA with mid basal inferior wall HK and luminal irrgularities elsewhere   Fracture of femoral neck, right ([place]) 06/13/2011   Fracture of humeral head, right, closed 06/13/2011   GERD (gastroesophageal reflux disease)    Hemorrhoids    Hyperlipemia    Hypertension    LBBB (left bundle branch block)    Macular degeneration    Myocardial infarction ([place])    Obstructive sleep apnea on CPAP    Osteopenia    Osteopenia    Pacemaker 02/25/2014   Rhinitis    Rotator cuff syndrome    SAH (subarachnoid hemorrhage) ([place]) 02/22/2014   Stokes Adams attack 02/22/2014   Ulnar neuropathy    Umbilical hernia    Unstable gait    Wears hearing aid  Per [facility] records   Past Surgical History:  Procedure Laterality Date   BREAST LUMPECTOMY     CATARACT EXTRACTION, BILATERAL     Per [facility]   CORONARY ANGIOPLASTY WITH STENT PLACEMENT  06/2006   Mid RCA BMS   HIP ARTHROPLASTY  06/06/2011   Procedure: ARTHROPLASTY BIPOLAR HIP;  Surgeon: Gearlean Alf;  Location: [facility] ORS;  Service: Orthopedics;  Laterality: Right;   PERMANENT PACEMAKER INSERTION N/A 02/25/2014   Procedure: PERMANENT PACEMAKER INSERTION;   Surgeon: Evans Lance, MD;  Location: [facility] CATH LAB;  Service: Cardiovascular;  Laterality: N/A;   SHOULDER SURGERY     TONSILLECTOMY     Per [facility]    WRIST SURGERY      Allergies  Allergen Reactions   Chlorpheniramine Anaphylaxis   Codeine Anaphylaxis   Nystatin Rash    redness   Amlodipine     Per [facility]     Outpatient Encounter Medications as of 04/13/2021  Medication Sig   loratadine (CLARITIN) 10 MG tablet Take 1 tablet (10 mg total) by mouth daily for 14 days.   acetaminophen (TYLENOL) 500 MG tablet Take 500 mg by mouth every 8 (eight) hours as needed.    aspirin EC 81 MG tablet Take 81 mg by mouth daily.   Carboxymethylcellulose Sodium (ARTIFICIAL TEARS OP) Apply 1 drop to eye as needed.   Chlorhexidine Gluconate 4 % SOLN Apply 1 application topically daily. On Tuesday or Friday   cholecalciferol (VITAMIN D) 25 MCG (1000 UNIT) tablet Take 3,000 Units by mouth daily. In the morning between 8-11 am   donepezil (ARICEPT) 10 MG tablet Take 1 tablet (10 mg total) by mouth at bedtime.   DULoxetine (CYMBALTA) 60 MG capsule Take 60 mg by mouth daily.   furosemide (LASIX) 40 MG tablet Take 40 mg by mouth daily. And prn edema   levothyroxine (SYNTHROID, LEVOTHROID) 25 MCG tablet Take 25 mcg by mouth daily before breakfast.    LORazepam (ATIVAN) 0.5 MG tablet Take 0.5 tablets (0.25 mg total) by mouth at bedtime.   losartan (COZAAR) 100 MG tablet Take 100 mg by mouth daily.   miconazole (MICOTIN) 2 % cream Apply 1 application topically as needed (Apply to rash under breasts and abdominal folds as needed).   mineral oil-hydrophilic petrolatum (AQUAPHOR) ointment Apply topically as needed for dry skin.   Multiple Vitamins-Minerals (PRESERVISION AREDS 2+MULTI VIT PO) Take by mouth 2 (two) times daily.   nebivolol (BYSTOLIC) 10 MG tablet Take 1 tablet (10 mg total) by mouth daily.   nitroGLYCERIN (NITROSTAT) 0.4 MG SL tablet Place 0.4 mg under the tongue  every 5 (five) minutes as needed.   potassium chloride SA (K-DUR,KLOR-CON) 20 MEQ tablet Take 20 mEq by mouth daily.    sodium fluoride (PREVIDENT 5000 PLUS) 1.1 % CREA dental cream Place 1 application onto teeth every evening.   UNABLE TO FIND CPAP Machine   vitamin B-12 (CYANOCOBALAMIN) 1000 MCG tablet Take 1,000 mcg by mouth daily.   No facility-administered encounter medications on file as of 04/13/2021.    Review of Systems  Constitutional:  Negative for activity change, appetite change, chills, diaphoresis, fatigue, fever and unexpected weight change.  HENT:  Positive for rhinorrhea. Negative for congestion, facial swelling, hearing loss, mouth sores, nosebleeds, postnasal drip, sinus pressure, sneezing, sore throat and trouble swallowing.   Eyes:  Positive for redness and itching.  Respiratory:  Negative for cough, shortness of breath and wheezing.   Cardiovascular:  Positive for leg swelling. Negative for chest pain and palpitations.  Gastrointestinal:  Negative for abdominal distention, abdominal pain, constipation and diarrhea.  Genitourinary:  Negative for difficulty urinating and dysuria.  Musculoskeletal:  Positive for gait problem. Negative for arthralgias, back pain, joint swelling and myalgias.  Skin:  Positive for rash.  Neurological:  Negative for dizziness, tremors, seizures, syncope, facial asymmetry, speech difficulty, weakness, light-headedness, numbness and headaches.  Psychiatric/Behavioral:  Positive for confusion. Negative for agitation and behavioral problems.    Immunization History  Administered Date(s) Administered   Influenza, High Dose Seasonal PF 02/18/2017, 03/22/2019, 03/14/2020   Influenza,inj,Quad PF,6+ Mos 03/24/2018   Influenza-Unspecified 03/04/2016, 03/18/2020, 02/25/2021   Moderna SARS-COV2 Booster Vaccination 04/03/2020, 01/13/2021   Moderna Sars-Covid-2 Vaccination 06/04/2019, 07/03/2019, 03/04/2021   Pneumococcal Conjugate-13 05/08/2014    Pneumococcal Polysaccharide-23 10/08/2017   Td 07/09/2002, 01/16/2013   Zoster Recombinat (Shingrix) 11/18/2017, 01/17/2018   Zoster, Live 06/30/2010   Pertinent  Health Maintenance Due  Topic Date Due   INFLUENZA VACCINE  Completed   DEXA SCAN  Completed   Fall Risk 06/14/2018 10/18/2018 12/21/2018 07/04/2019 01/15/2021  Falls in the past year? 0 0 0 0 -  Was there an injury with Fall? 0 0 0 0 -  Fall Risk Category Calculator 0 0 0 0 -  Fall Risk Category Low Low Low Low -  Patient Fall Risk Level - Low fall risk - Low fall risk Moderate fall risk  Patient at Risk for Falls Due to - - Impaired mobility - History of fall(s)  Fall risk Follow up - - Falls evaluation completed;Falls prevention discussed - Falls evaluation completed;Follow up appointment   Functional Status Survey:    Vitals:   04/13/21 1042  BP: (!) 160/54  Pulse: 64  Temp: (!) 97 F (36.1 C)  Weight: 255 lb (115.7 kg)   Body mass index is 49.8 kg/m. Physical Exam Vitals and nursing note reviewed.  Constitutional:      General: She is not in acute distress.    Appearance: She is not diaphoretic.  HENT:     Head: Normocephalic and atraumatic.     Mouth/Throat:     Mouth: Mucous membranes are moist.     Pharynx: Oropharynx is clear.  Eyes:     Conjunctiva/sclera: Conjunctivae normal.     Pupils: Pupils are equal, round, and reactive to light.     Comments: Erythema to bilateral upper and lower lids with bilateral clear drainage.   Neck:     Vascular: No JVD.  Cardiovascular:     Rate and Rhythm: Normal rate and regular rhythm.     Heart sounds: No murmur heard. Pulmonary:     Effort: Pulmonary effort is normal. No respiratory distress.     Breath sounds: Normal breath sounds. No wheezing.  Abdominal:     General: Abdomen is flat. Bowel sounds are normal.     Palpations: Abdomen is soft.  Musculoskeletal:        General: No swelling, tenderness, deformity or signs of injury.     Right lower leg: Edema  (+2 with chronic erythema) present.     Left lower leg: Edema (+2 with chronic erythema) present.  Skin:    General: Skin is warm and dry.  Neurological:     Mental Status: She is alert.     Comments: Very pleasant alert to self and place. Forgetful of the details of her care and the date.   Psychiatric:  Mood and Affect: Mood normal.    Labs reviewed: Recent Labs    09/29/20 0000  NA 141  K 4.6  CL 101  CO2 26*  BUN 14  CREATININE 0.9   No results for input(s): AST, ALT, ALKPHOS, BILITOT, PROT, ALBUMIN in the last 8760 hours. Recent Labs    09/29/20 0000  WBC 7.2  HGB 12.6  HCT 38  PLT 246   Lab Results  Component Value Date   TSH 3.96 09/29/2020   Lab Results  Component Value Date   HGBA1C 6.1 03/01/2019   Lab Results  Component Value Date   CHOL 200 10/18/2019   HDL 44 10/18/2019   LDLCALC 129 10/18/2019   TRIG 137 10/18/2019    Significant Diagnostic Results in last 30 days:  No results found.  Assessment/Plan 1. Fall, initial encounter Due to gait instability from dementia, obesity, knee issues along with large dose of ativan for dental procedure. Would recommend lower dose of possible due to her age.   2. Allergic conjunctivitis of both eyes  - loratadine (CLARITIN) 10 MG tablet; Take 1 tablet (10 mg total) by mouth daily for 14 days.  Dispense: 14 tablet -if not improving could try patanol   3. Venous insufficiency Her legs have not changed Recommend elevation and compression as per before Can have lasix as needed if worsening.      Labs/tests ordered:  NA

## 2021-04-24 ENCOUNTER — Other Ambulatory Visit: Payer: Self-pay | Admitting: Adult Health

## 2021-04-24 MED ORDER — LORAZEPAM 0.5 MG PO TABS: 0.2500 mg | ORAL_TABLET | Freq: Every day | ORAL | 2 refills | Status: DC

## 2021-04-27 ENCOUNTER — Non-Acute Institutional Stay (SKILLED_NURSING_FACILITY): Payer: Medicare Other | Admitting: Adult Health

## 2021-04-27 ENCOUNTER — Encounter: Payer: Self-pay | Admitting: Adult Health

## 2021-04-27 DIAGNOSIS — R051 Acute cough: Secondary | ICD-10-CM | POA: Diagnosis not present

## 2021-04-27 NOTE — Progress Notes (Signed)
Location:   [facility] Room Number: 124 Place of Service:  SNF [phone]) Provider:  Royal Hawthorn, NP  Virgie Dad, MD  Patient Care Team: Virgie Dad, MD as PCP - General (Internal Medicine) Sueanne Margarita, MD as PCP - Sleep Medicine (Sleep Medicine) Evans Lance, MD as PCP - Cardiology (Cardiology) Justice Britain, MD as Consulting Physician (Orthopedic Surgery) Gaynelle Arabian, MD as Consulting Physician (Orthopedic Surgery) Garvin Fila, MD as Consulting Physician (Neurology) Evans Lance, MD as Consulting Physician (Cardiology) Jettie Booze, MD as Consulting Physician (Interventional Cardiology) Shon Hough, MD as Consulting Physician (Ophthalmology) Community, [facility] ([place]) Royal Hawthorn, NP as Nurse Practitioner (Nurse Practitioner)  Extended Emergency Contact Information Primary Emergency Contact: [place],JED Address: [place], [place] of [place] Phone: [phone] Mobile Phone: [phone] Relation: Son Secondary Emergency Contact: [place] Mobile Phone: [phone] Relation: Daughter  Code Status:  DNR Goals of care: Advanced Directive information Advanced Directives 04/27/2021  Does Patient Have a Medical Advance Directive? Yes  Type of Advance Directive Living will;Out of facility DNR (pink MOST or yellow form)  Does patient want to make changes to medical advance directive? No - Patient declined  Copy of [place] in Chart? -  Pre-existing out of facility DNR order (yellow form or pink MOST form) Yellow form placed in chart (order not valid for inpatient use)     Chief Complaint  Patient presents with   Acute Visit    Cough    HPI:  Pt is a 87 y.o. female seen today for an acute visit for a cough. The cough has been present since 12/1.  She does not have any other associated symptoms such as  sputum production, fever, sore throat, or body aches. She is eating and drinking well. Alert and in her usual state of health. They did think she seemed tired at the end of last week but now she is feeling better. Rapid covid done and was negative 04/27/21   Past Medical History:  Diagnosis Date   Breast cancer ([place])    Colon polyps    Coronary artery disease 06/24/2006   STEMI inferior with BMS to mid RCA with mid basal inferior wall HK and luminal irrgularities elsewhere   Fracture of femoral neck, right ([place]) 06/13/2011   Fracture of humeral head, right, closed 06/13/2011   GERD (gastroesophageal reflux disease)    Hemorrhoids    Hyperlipemia    Hypertension    LBBB (left bundle branch block)    Macular degeneration    Myocardial infarction ([place])    Obstructive sleep apnea on CPAP    Osteopenia    Osteopenia    Pacemaker 02/25/2014   Rhinitis    Rotator cuff syndrome    SAH (subarachnoid hemorrhage) ([place]) 02/22/2014   Stokes Adams attack 02/22/2014   Ulnar neuropathy    Umbilical hernia    Unstable gait    Wears hearing aid    Per [facility] records   Past Surgical History:  Procedure Laterality Date   BREAST LUMPECTOMY     CATARACT EXTRACTION, BILATERAL     Per [facility]   CORONARY ANGIOPLASTY WITH STENT PLACEMENT  06/2006   Mid RCA BMS   HIP ARTHROPLASTY  06/06/2011   Procedure: ARTHROPLASTY BIPOLAR HIP;  Surgeon: Gearlean Alf;  Location: [facility];  Service: Orthopedics;  Laterality: Right;  PERMANENT PACEMAKER INSERTION N/A 02/25/2014   Procedure: PERMANENT PACEMAKER INSERTION;  Surgeon: Evans Lance, MD;  Location: [facility] CATH LAB;  Service: Cardiovascular;  Laterality: N/A;   SHOULDER SURGERY     TONSILLECTOMY     Per [facility]    WRIST SURGERY      Allergies  Allergen Reactions   Chlorpheniramine Anaphylaxis   Codeine Anaphylaxis   Nystatin Rash    redness   Amlodipine     Per [facility]     Allergies as of  04/27/2021       Reactions   Chlorpheniramine Anaphylaxis   Codeine Anaphylaxis   Nystatin Rash   redness   Amlodipine    Per [facility]         Medication List        Accurate as of April 27, 2021  2:04 PM. If you have any questions, ask your nurse or doctor.          acetaminophen 500 MG tablet Commonly known as: TYLENOL Take 500 mg by mouth every 8 (eight) hours as needed.   ARTIFICIAL TEARS OP Apply 1 drop to eye as needed.   aspirin EC 81 MG tablet Take 81 mg by mouth daily.   Chlorhexidine Gluconate 4 % Soln Apply 1 application topically daily. On Tuesday or Friday   cholecalciferol 25 MCG (1000 UNIT) tablet Commonly known as: VITAMIN D Take 3,000 Units by mouth daily. In the morning between 8-11 am   donepezil 10 MG tablet Commonly known as: Aricept Take 1 tablet (10 mg total) by mouth at bedtime.   DULoxetine 60 MG capsule Commonly known as: CYMBALTA Take 60 mg by mouth daily.   furosemide 40 MG tablet Commonly known as: LASIX Take 40 mg by mouth daily. And prn edema   levothyroxine 25 MCG tablet Commonly known as: SYNTHROID Take 25 mcg by mouth daily before breakfast.   loratadine 10 MG tablet Commonly known as: CLARITIN Take 1 tablet (10 mg total) by mouth daily for 14 days.   LORazepam 0.5 MG tablet Commonly known as: ATIVAN Take 0.5 tablets (0.25 mg total) by mouth at bedtime.   losartan 100 MG tablet Commonly known as: COZAAR Take 100 mg by mouth daily.   miconazole 2 % cream Commonly known as: MICOTIN Apply 1 application topically as needed (Apply to rash under breasts and abdominal folds as needed).   mineral oil-hydrophilic petrolatum ointment Apply topically as needed for dry skin.   nebivolol 10 MG tablet Commonly known as: Bystolic Take 1 tablet (10 mg total) by mouth daily.   nitroGLYCERIN 0.4 MG SL tablet Commonly known as: NITROSTAT Place 0.4 mg under the tongue every 5 (five) minutes as needed.    potassium chloride SA 20 MEQ tablet Commonly known as: KLOR-CON M Take 20 mEq by mouth daily.   PRESERVISION AREDS 2+MULTI VIT PO Take by mouth 2 (two) times daily.   Robafen DM 10-100 MG/5ML liquid Generic drug: Dextromethorphan-guaiFENesin Take 5 mLs by mouth every 6 (six) hours as needed.   sodium fluoride 1.1 % Crea dental cream Commonly known as: PREVIDENT 5000 PLUS Place 1 application onto teeth every evening.   UNABLE TO FIND CPAP Machine   vitamin B-12 1000 MCG tablet Commonly known as: CYANOCOBALAMIN Take 1,000 mcg by mouth daily.        Review of Systems  Constitutional:  Positive for fatigue. Negative for activity change, appetite change, chills, diaphoresis and fever.  HENT:  Negative  for facial swelling, hearing loss, sinus pressure, sinus pain, sneezing and sore throat.   Eyes:  Negative for pain, discharge and itching.  Respiratory:  Positive for cough. Negative for shortness of breath and wheezing.   Cardiovascular:  Positive for leg swelling. Negative for chest pain.  Gastrointestinal:  Negative for abdominal pain, constipation, diarrhea, nausea and vomiting.  Genitourinary:  Negative for dysuria and urgency.  Musculoskeletal:  Positive for arthralgias and gait problem. Negative for back pain, myalgias and neck pain.  Skin:  Negative for rash.  Neurological:  Negative for weakness.  Psychiatric/Behavioral:  Positive for confusion.    Immunization History  Administered Date(s) Administered   Influenza, High Dose Seasonal PF 02/18/2017, 03/22/2019, 03/14/2020   Influenza,inj,Quad PF,6+ Mos 03/24/2018   Influenza-Unspecified 03/04/2016, 03/18/2020, 02/25/2021   Moderna SARS-COV2 Booster Vaccination 04/03/2020, 01/13/2021   Moderna Sars-Covid-2 Vaccination 06/04/2019, 07/03/2019, 03/04/2021   Pneumococcal Conjugate-13 05/08/2014   Pneumococcal Polysaccharide-23 10/08/2017   Td 07/09/2002, 01/16/2013   Zoster Recombinat (Shingrix) 11/18/2017, 01/17/2018    Zoster, Live 06/30/2010   Pertinent  Health Maintenance Due  Topic Date Due   INFLUENZA VACCINE  Completed   DEXA SCAN  Completed   Fall Risk 06/14/2018 10/18/2018 12/21/2018 07/04/2019 01/15/2021  Falls in the past year? 0 0 0 0 -  Was there an injury with Fall? 0 0 0 0 -  Fall Risk Category Calculator 0 0 0 0 -  Fall Risk Category Low Low Low Low -  Patient Fall Risk Level - Low fall risk - Low fall risk Moderate fall risk  Patient at Risk for Falls Due to - - Impaired mobility - History of fall(s)  Fall risk Follow up - - Falls evaluation completed;Falls prevention discussed - Falls evaluation completed;Follow up appointment   Functional Status Survey:    Vitals:   04/27/21 1257  BP: (!) 154/77  Pulse: 70  Resp: 20  Temp: (!) 96.8 F (36 C)  SpO2: 95%  Weight: 255 lb 12.8 oz (116 kg)  Height: 5' (1.524 m)   Body mass index is 49.96 kg/m. Physical Exam Vitals and nursing note reviewed.  Constitutional:      General: She is not in acute distress.    Appearance: She is not diaphoretic.  HENT:     Head: Normocephalic and atraumatic.     Mouth/Throat:     Mouth: Mucous membranes are moist.     Pharynx: Oropharynx is clear.  Eyes:     Conjunctiva/sclera: Conjunctivae normal.     Pupils: Pupils are equal, round, and reactive to light.  Neck:     Vascular: No JVD.  Cardiovascular:     Rate and Rhythm: Normal rate and regular rhythm.     Heart sounds: No murmur heard. Pulmonary:     Effort: Pulmonary effort is normal. No respiratory distress.     Breath sounds: Normal breath sounds. No wheezing.  Skin:    General: Skin is warm and dry.  Neurological:     General: No focal deficit present.     Mental Status: She is alert. Mental status is at baseline.  Psychiatric:        Mood and Affect: Mood normal.    Labs reviewed: Recent Labs    09/29/20 0000  NA 141  K 4.6  CL 101  CO2 26*  BUN 14  CREATININE 0.9   No results for input(s): AST, ALT, ALKPHOS,  BILITOT, PROT, ALBUMIN in the last 8760 hours. Recent Labs    09/29/20 0000  WBC 7.2  HGB 12.6  HCT 38  PLT 246   Lab Results  Component Value Date   TSH 3.96 09/29/2020   Lab Results  Component Value Date   HGBA1C 6.1 03/01/2019   Lab Results  Component Value Date   CHOL 200 10/18/2019   HDL 44 10/18/2019   LDLCALC 129 10/18/2019   TRIG 137 10/18/2019    Significant Diagnostic Results in last 30 days:  No results found.  Assessment/Plan  1. Acute cough Rapid covid neg Cough is mild and dry No low 02 sats, fever, or abnormal breath sounds Continue to monitor if worsening or not improving check CXR Monitor vitals qshift x 72 hrs Continue robitussin dm per protocol for cough   Family/ staff Communication: nurse   Labs/tests ordered:   rapid covid

## 2021-04-28 ENCOUNTER — Encounter: Payer: Self-pay | Admitting: Adult Health

## 2021-05-12 ENCOUNTER — Ambulatory Visit (INDEPENDENT_AMBULATORY_CARE_PROVIDER_SITE_OTHER): Payer: Medicare Other

## 2021-05-12 ENCOUNTER — Non-Acute Institutional Stay (SKILLED_NURSING_FACILITY): Payer: Medicare Other | Admitting: Orthopedic Surgery

## 2021-05-12 ENCOUNTER — Encounter: Payer: Self-pay | Admitting: Orthopedic Surgery

## 2021-05-12 DIAGNOSIS — F0150 Vascular dementia without behavioral disturbance: Secondary | ICD-10-CM | POA: Diagnosis not present

## 2021-05-12 DIAGNOSIS — G4733 Obstructive sleep apnea (adult) (pediatric): Secondary | ICD-10-CM | POA: Diagnosis not present

## 2021-05-12 DIAGNOSIS — F3341 Major depressive disorder, recurrent, in partial remission: Secondary | ICD-10-CM | POA: Diagnosis not present

## 2021-05-12 DIAGNOSIS — E538 Deficiency of other specified B group vitamins: Secondary | ICD-10-CM

## 2021-05-12 DIAGNOSIS — H04123 Dry eye syndrome of bilateral lacrimal glands: Secondary | ICD-10-CM | POA: Diagnosis not present

## 2021-05-12 DIAGNOSIS — R55 Syncope and collapse: Secondary | ICD-10-CM | POA: Diagnosis not present

## 2021-05-12 DIAGNOSIS — E669 Obesity, unspecified: Secondary | ICD-10-CM

## 2021-05-12 DIAGNOSIS — E039 Hypothyroidism, unspecified: Secondary | ICD-10-CM

## 2021-05-12 DIAGNOSIS — I5032 Chronic diastolic (congestive) heart failure: Secondary | ICD-10-CM | POA: Diagnosis not present

## 2021-05-12 DIAGNOSIS — Z9989 Dependence on other enabling machines and devices: Secondary | ICD-10-CM

## 2021-05-12 DIAGNOSIS — I10 Essential (primary) hypertension: Secondary | ICD-10-CM

## 2021-05-12 DIAGNOSIS — H6122 Impacted cerumen, left ear: Secondary | ICD-10-CM

## 2021-05-12 LAB — CUP PACEART REMOTE DEVICE CHECK
Battery Remaining Longevity: 35 mo
Battery Remaining Percentage: 30 %
Battery Voltage: 2.9 V
Brady Statistic AP VP Percent: 69 %
Brady Statistic AP VS Percent: 1 %
Brady Statistic AS VP Percent: 29 %
Brady Statistic AS VS Percent: 1 %
Brady Statistic RA Percent Paced: 68 %
Brady Statistic RV Percent Paced: 98 %
Date Time Interrogation Session: 20221220020014
Implantable Lead Implant Date: 20151005
Implantable Lead Implant Date: 20151005
Implantable Lead Location: 753859
Implantable Lead Location: 753860
Implantable Pulse Generator Implant Date: 20151005
Lead Channel Impedance Value: 450 Ohm
Lead Channel Impedance Value: 610 Ohm
Lead Channel Pacing Threshold Amplitude: 0.75 V
Lead Channel Pacing Threshold Amplitude: 1.125 V
Lead Channel Pacing Threshold Pulse Width: 0.5 ms
Lead Channel Pacing Threshold Pulse Width: 0.5 ms
Lead Channel Sensing Intrinsic Amplitude: 12 mV
Lead Channel Sensing Intrinsic Amplitude: 5 mV
Lead Channel Setting Pacing Amplitude: 1 V
Lead Channel Setting Pacing Amplitude: 2.125
Lead Channel Setting Pacing Pulse Width: 0.5 ms
Lead Channel Setting Sensing Sensitivity: 4 mV
Pulse Gen Model: 2240
Pulse Gen Serial Number: 7666288

## 2021-05-12 NOTE — Progress Notes (Signed)
Location:  [facility] Room Number: 124 Place of Service:  SNF [phone]) Provider:  Windell Moulding, AGNP-C  Virgie Dad, MD  Patient Care Team: Virgie Dad, MD as PCP - General (Internal Medicine) Sueanne Margarita, MD as PCP - Sleep Medicine (Sleep Medicine) Evans Lance, MD as PCP - Cardiology (Cardiology) Justice Britain, MD as Consulting Physician (Orthopedic Surgery) Gaynelle Arabian, MD as Consulting Physician (Orthopedic Surgery) Garvin Fila, MD as Consulting Physician (Neurology) Evans Lance, MD as Consulting Physician (Cardiology) Jettie Booze, MD as Consulting Physician (Interventional Cardiology) Shon Hough, MD as Consulting Physician (Ophthalmology) Community, [facility] ([place]) Royal Hawthorn, NP as Nurse Practitioner (Nurse Practitioner)  Extended Emergency Contact Information Primary Emergency Contact: Duthie,JED Address: [place], [place] Phone: [phone] Mobile Phone: [phone] Relation: Son Secondary Emergency Contact: [place] Mobile Phone: [phone] Relation: Daughter  Code Status:  DNR Goals of care: Advanced Directive information Advanced Directives 04/27/2021  Does Patient Have a Medical Advance Directive? Yes  Type of Advance Directive Living will;Out of facility DNR (pink MOST or yellow form)  Does patient want to make changes to medical advance directive? No - Patient declined  Copy of [place] in Chart? -  Pre-existing out of facility DNR order (yellow form or pink MOST form) Yellow form placed in chart (order not valid for inpatient use)     Chief Complaint  Patient presents with   Medical Management of Chronic Issues    HPI:  Pt is a 87 y.o. female seen today for medical management of chronic diseases.    She continues to reside on the skilled nursing unit at [facility]  PMH: aortic insufficiency, diastolic heart failure, CAD, HTN, MI, OSA on CPAP, GERD, hypothyroidism, obesity, and unstable gait.   Vascular dementia- MMSE 28/30, CT head 2019 noted moderate atrophy with moderate small vessel ischemic changes of white matter, sometimes forgets husband has passed, no recent behavioral outbursts, remains on donepezil and lorazepam prn HTN- BUN/creat 14/0.9 09/29/2020, remains on losartan and bystolic Diastolic heart failure- no weight fluctuations, sob, some ankle edema, remains on furosemide and potassium daily OSA- CPAP qhs Depression- no changes in mood, remains on Cymbalta Hypothyroidism- TSH 3.96 09/29/2020, remains on levothyroxine B12 deficiency- remains on cyanocobalamin daily Dry eyes- reports eyes feeling dry and itchy, intermittent for 1 week, asking for drops Decreased hearing- reports minor hearing loss to left ear  No recent falls or injuries. Ambulates with wheelchair.   Recent blood pressures:  12/13- 123/67  12/07- 146/51, 166/67  Recent weights:  12/18- 254.3 lbs  11/20- 255 lbs  10/12- 252 lbs         Past Medical History:  Diagnosis Date   Breast cancer ([place])    Colon polyps    Coronary artery disease 06/24/2006   STEMI inferior with BMS to mid RCA with mid basal inferior wall HK and luminal irrgularities elsewhere   Fracture of femoral neck, right ([place]) 06/13/2011   Fracture of humeral head, right, closed 06/13/2011   GERD (gastroesophageal reflux disease)    Hemorrhoids    Hyperlipemia    Hypertension    LBBB (left bundle branch block)    Macular degeneration    Myocardial infarction ([place])    Obstructive sleep apnea on CPAP    Osteopenia    Osteopenia    Pacemaker 02/25/2014   Rhinitis  Rotator cuff syndrome    SAH (subarachnoid hemorrhage) ([place]) 02/22/2014   [place] attack 02/22/2014   Ulnar neuropathy    Umbilical hernia    Unstable gait    Wears hearing aid    Per [facility] records   Past  Surgical History:  Procedure Laterality Date   BREAST LUMPECTOMY     CATARACT EXTRACTION, BILATERAL     Per [facility]   CORONARY ANGIOPLASTY WITH STENT PLACEMENT  06/2006   Mid RCA BMS   HIP ARTHROPLASTY  06/06/2011   Procedure: ARTHROPLASTY BIPOLAR HIP;  Surgeon: Gearlean Alf;  Location: [facility] ORS;  Service: Orthopedics;  Laterality: Right;   PERMANENT PACEMAKER INSERTION N/A 02/25/2014   Procedure: PERMANENT PACEMAKER INSERTION;  Surgeon: Evans Lance, MD;  Location: [facility] CATH LAB;  Service: Cardiovascular;  Laterality: N/A;   SHOULDER SURGERY     TONSILLECTOMY     Per [facility]    WRIST SURGERY      Allergies  Allergen Reactions   Chlorpheniramine Anaphylaxis   Codeine Anaphylaxis   Nystatin Rash    redness   Amlodipine     Per [facility]     Outpatient Encounter Medications as of 05/12/2021  Medication Sig   acetaminophen (TYLENOL) 500 MG tablet Take 500 mg by mouth every 8 (eight) hours as needed.    aspirin EC 81 MG tablet Take 81 mg by mouth daily.   Carboxymethylcellulose Sodium (ARTIFICIAL TEARS OP) Apply 1 drop to eye as needed.   Chlorhexidine Gluconate 4 % SOLN Apply 1 application topically daily. On Tuesday or Friday   cholecalciferol (VITAMIN D) 25 MCG (1000 UNIT) tablet Take 3,000 Units by mouth daily. In the morning between 8-11 am   donepezil (ARICEPT) 10 MG tablet Take 1 tablet (10 mg total) by mouth at bedtime.   DULoxetine (CYMBALTA) 60 MG capsule Take 60 mg by mouth daily.   furosemide (LASIX) 40 MG tablet Take 40 mg by mouth daily. And prn edema   levothyroxine (SYNTHROID, LEVOTHROID) 25 MCG tablet Take 25 mcg by mouth daily before breakfast.    loratadine (CLARITIN) 10 MG tablet Take 1 tablet (10 mg total) by mouth daily for 14 days.   LORazepam (ATIVAN) 0.5 MG tablet Take 0.5 tablets (0.25 mg total) by mouth at bedtime.   losartan (COZAAR) 100 MG tablet Take 100 mg by mouth daily.   miconazole (MICOTIN) 2 % cream  Apply 1 application topically as needed (Apply to rash under breasts and abdominal folds as needed).   mineral oil-hydrophilic petrolatum (AQUAPHOR) ointment Apply topically as needed for dry skin.   Multiple Vitamins-Minerals (PRESERVISION AREDS 2+MULTI VIT PO) Take by mouth 2 (two) times daily.   nebivolol (BYSTOLIC) 10 MG tablet Take 1 tablet (10 mg total) by mouth daily.   nitroGLYCERIN (NITROSTAT) 0.4 MG SL tablet Place 0.4 mg under the tongue every 5 (five) minutes as needed.   potassium chloride SA (K-DUR,KLOR-CON) 20 MEQ tablet Take 20 mEq by mouth daily.    sodium fluoride (PREVIDENT 5000 PLUS) 1.1 % CREA dental cream Place 1 application onto teeth every evening.   UNABLE TO FIND CPAP Machine   vitamin B-12 (CYANOCOBALAMIN) 1000 MCG tablet Take 1,000 mcg by mouth daily.   No facility-administered encounter medications on file as of 05/12/2021.    Review of Systems  Constitutional:  Negative for activity change, appetite change, chills, fatigue and fever.  HENT:  Positive for hearing loss. Negative for trouble swallowing.   Eyes:  Positive for itching. Negative for pain and redness.  Respiratory:  Negative for cough, shortness of breath and wheezing.   Cardiovascular:  Positive for leg swelling. Negative for chest pain.  Gastrointestinal:  Positive for constipation. Negative for abdominal distention, abdominal pain, blood in stool, diarrhea and nausea.  Genitourinary:  Negative for dysuria, frequency and hematuria.  Musculoskeletal:  Positive for arthralgias and gait problem.  Skin:  Negative for rash and wound.  Neurological:  Positive for weakness. Negative for dizziness and headaches.  Psychiatric/Behavioral:  Positive for confusion and dysphoric mood. Negative for sleep disturbance. The patient is not nervous/anxious.    Immunization History  Administered Date(s) Administered   Influenza, High Dose Seasonal PF 02/18/2017, 03/22/2019, 03/14/2020   Influenza,inj,Quad PF,6+ Mos  03/24/2018   Influenza-Unspecified 03/04/2016, 03/18/2020, 02/25/2021   Moderna SARS-COV2 Booster Vaccination 04/03/2020, 01/13/2021   Moderna Sars-Covid-2 Vaccination 06/04/2019, 07/03/2019, 03/04/2021   Pneumococcal Conjugate-13 05/08/2014   Pneumococcal Polysaccharide-23 10/08/2017   Td 07/09/2002, 01/16/2013   Zoster Recombinat (Shingrix) 11/18/2017, 01/17/2018   Zoster, Live 06/30/2010   Pertinent  Health Maintenance Due  Topic Date Due   INFLUENZA VACCINE  Completed   DEXA SCAN  Completed   Fall Risk 06/14/2018 10/18/2018 12/21/2018 07/04/2019 01/15/2021  Falls in the past year? 0 0 0 0 -  Was there an injury with Fall? 0 0 0 0 -  Fall Risk Category Calculator 0 0 0 0 -  Fall Risk Category Low Low Low Low -  Patient Fall Risk Level - Low fall risk - Low fall risk Moderate fall risk  Patient at Risk for Falls Due to - - Impaired mobility - History of fall(s)  Fall risk Follow up - - Falls evaluation completed;Falls prevention discussed - Falls evaluation completed;Follow up appointment   Functional Status Survey:    Vitals:   05/12/21 1056  BP: 123/67  Pulse: 60  Resp: 15  Temp: (!) 97.2 F (36.2 C)  SpO2: 96%  Weight: 254 lb 4.8 oz (115.3 kg)   Body mass index is 49.66 kg/m. Physical Exam Vitals reviewed.  Constitutional:      General: She is not in acute distress.    Appearance: She is obese.  HENT:     Head: Normocephalic.     Right Ear: There is no impacted cerumen.     Left Ear: There is impacted cerumen.     Nose: Nose normal.     Mouth/Throat:     Mouth: Mucous membranes are moist.     Pharynx: No oropharyngeal exudate or posterior oropharyngeal erythema.  Eyes:     General:        Right eye: No discharge.        Left eye: No discharge.     Extraocular Movements: Extraocular movements intact.     Conjunctiva/sclera: Conjunctivae normal.     Pupils: Pupils are equal, round, and reactive to light.  Neck:     Vascular: No carotid bruit.   Cardiovascular:     Rate and Rhythm: Normal rate and regular rhythm.     Pulses: Normal pulses.     Heart sounds: Normal heart sounds. No murmur heard. Pulmonary:     Effort: No respiratory distress.     Breath sounds: Normal breath sounds. No wheezing.  Abdominal:     General: Bowel sounds are normal. There is no distension.     Palpations: Abdomen is soft.     Tenderness: There is no abdominal tenderness.  Musculoskeletal:     Cervical  back: Normal range of motion.     Right lower leg: Edema present.     Left lower leg: Edema present.     Comments: Non-pitting  Lymphadenopathy:     Cervical: No cervical adenopathy.  Skin:    General: Skin is warm and dry.     Capillary Refill: Capillary refill takes less than 2 seconds.  Neurological:     General: No focal deficit present.     Mental Status: She is alert. Mental status is at baseline.     Motor: Weakness present.     Gait: Gait abnormal.     Comments: wheelchair  Psychiatric:        Mood and Affect: Mood normal.        Behavior: Behavior normal.        Cognition and Memory: Memory is impaired.     Comments: Very pleasant, alert to self, familiar faces and place    Labs reviewed: Recent Labs    09/29/20 0000  NA 141  K 4.6  CL 101  CO2 26*  BUN 14  CREATININE 0.9   No results for input(s): AST, ALT, ALKPHOS, BILITOT, PROT, ALBUMIN in the last 8760 hours. Recent Labs    09/29/20 0000  WBC 7.2  HGB 12.6  HCT 38  PLT 246   Lab Results  Component Value Date   TSH 3.96 09/29/2020   Lab Results  Component Value Date   HGBA1C 6.1 03/01/2019   Lab Results  Component Value Date   CHOL 200 10/18/2019   HDL 44 10/18/2019   LDLCALC 129 10/18/2019   TRIG 137 10/18/2019    Significant Diagnostic Results in last 30 days:  No results found.  Assessment/Plan 1. Vascular dementia without behavioral disturbance ([place]) - no recent behavioral outbursts - cont skilled nursing care - cont donepezil and  lorazepam  2. Primary hypertension - controlled - cont losartan and Bystolic - cbc/diff - cmp  3. Chronic diastolic heart failure ([facility]) - no recent weight fluctuations or sob, non pitting edema present - cont furosemide  4. Obstructive sleep apnea on CPAP - cont CPAP qhs  5. Depression, major, recurrent, in partial remission ([place]) - no recent mood changes - cont Cymbalta  6. Acquired hypothyroidism - TSH normal - cont levothyroxine  7. B12 deficiency - cont supplement  8. Dry eyes - exam unremarkable - start artificial tears - [facility] protocol- Misty RN notified  9. Impacted cerumen of left ear - cannot visualize TM due to wax - start [facility] protocol- Misty RN notified  10. Obesity (BMI 30-39.9) - 07/2020 240 lbs - remains on regular diet - advised to limit snacking and sweets    Family/ staff Communication: plan discussed with patient and nurse  Labs/tests ordered:  cbc/diff, cmp

## 2021-05-14 DIAGNOSIS — I10 Essential (primary) hypertension: Secondary | ICD-10-CM | POA: Diagnosis not present

## 2021-05-14 LAB — CBC AND DIFFERENTIAL
HCT: 39 (ref 36–46)
Hemoglobin: 12.9 (ref 12.0–16.0)
Neutrophils Absolute: 4.7
Platelets: 244 (ref 150–399)
WBC: 8.4

## 2021-05-14 LAB — BASIC METABOLIC PANEL
BUN: 16 (ref 4–21)
CO2: 27 — AB (ref 13–22)
Chloride: 100 (ref 99–108)
Creatinine: 1 (ref 0.5–1.1)
Glucose: 118
Potassium: 4.5 (ref 3.4–5.3)
Sodium: 139 (ref 137–147)

## 2021-05-14 LAB — COMPREHENSIVE METABOLIC PANEL
Albumin: 3.9 (ref 3.5–5.0)
Calcium: 9.7 (ref 8.7–10.7)
GFR calc Af Amer: 61.3
GFR calc non Af Amer: 52.89
Globulin: 3.2

## 2021-05-14 LAB — HEPATIC FUNCTION PANEL
ALT: 11 (ref 7–35)
AST: 18 (ref 13–35)
Alkaline Phosphatase: 113 (ref 25–125)
Bilirubin, Total: 0.5

## 2021-05-14 LAB — CBC: RBC: 4.3 (ref 3.87–5.11)

## 2021-05-21 NOTE — Progress Notes (Signed)
Remote pacemaker transmission.   

## 2021-06-09 ENCOUNTER — Non-Acute Institutional Stay (SKILLED_NURSING_FACILITY): Payer: Medicare Other | Admitting: Orthopedic Surgery

## 2021-06-09 ENCOUNTER — Encounter: Payer: Self-pay | Admitting: Orthopedic Surgery

## 2021-06-09 DIAGNOSIS — G4733 Obstructive sleep apnea (adult) (pediatric): Secondary | ICD-10-CM

## 2021-06-09 DIAGNOSIS — F0150 Vascular dementia without behavioral disturbance: Secondary | ICD-10-CM

## 2021-06-09 DIAGNOSIS — I5032 Chronic diastolic (congestive) heart failure: Secondary | ICD-10-CM | POA: Diagnosis not present

## 2021-06-09 DIAGNOSIS — L602 Onychogryphosis: Secondary | ICD-10-CM | POA: Diagnosis not present

## 2021-06-09 DIAGNOSIS — B369 Superficial mycosis, unspecified: Secondary | ICD-10-CM

## 2021-06-09 DIAGNOSIS — E039 Hypothyroidism, unspecified: Secondary | ICD-10-CM | POA: Diagnosis not present

## 2021-06-09 DIAGNOSIS — E538 Deficiency of other specified B group vitamins: Secondary | ICD-10-CM | POA: Diagnosis not present

## 2021-06-09 DIAGNOSIS — I10 Essential (primary) hypertension: Secondary | ICD-10-CM

## 2021-06-09 DIAGNOSIS — H04123 Dry eye syndrome of bilateral lacrimal glands: Secondary | ICD-10-CM | POA: Diagnosis not present

## 2021-06-09 DIAGNOSIS — F3341 Major depressive disorder, recurrent, in partial remission: Secondary | ICD-10-CM | POA: Diagnosis not present

## 2021-06-09 DIAGNOSIS — Z9989 Dependence on other enabling machines and devices: Secondary | ICD-10-CM | POA: Diagnosis not present

## 2021-06-09 NOTE — Progress Notes (Signed)
Location:   [place] Room Number: 124 Place of Service:  SNF [phone]) Provider:  Windell Moulding, NP  Virgie Dad, MD  Patient Care Team: Virgie Dad, MD as PCP - General (Internal Medicine) Sueanne Margarita, MD as PCP - Sleep Medicine (Sleep Medicine) Evans Lance, MD as PCP - Cardiology (Cardiology) Justice Britain, MD as Consulting Physician (Orthopedic Surgery) Gaynelle Arabian, MD as Consulting Physician (Orthopedic Surgery) Garvin Fila, MD as Consulting Physician (Neurology) Evans Lance, MD as Consulting Physician (Cardiology) Jettie Booze, MD as Consulting Physician (Interventional Cardiology) Shon Hough, MD as Consulting Physician (Ophthalmology) Community, [facility] ([place]) Royal Hawthorn, NP as Nurse Practitioner (Nurse Practitioner)  Extended Emergency Contact Information Primary Emergency Contact: Dambrosia,JED Address: [place], [place] of [place] Phone: [phone] Mobile Phone: [phone] Relation: Son Secondary Emergency Contact: [place] Mobile Phone: [phone] Relation: Daughter  Code Status:  DNR Goals of care: Advanced Directive information Advanced Directives 06/09/2021  Does Patient Have a Medical Advance Directive? Yes  Type of Advance Directive Out of facility DNR (pink MOST or yellow form)  Does patient want to make changes to medical advance directive? No - Patient declined  Copy of [place] in Chart? -  Pre-existing out of facility DNR order (yellow form or pink MOST form) Yellow form placed in chart (order not valid for inpatient use)     Chief Complaint  Patient presents with   Medical Management of Chronic Issues    Routine follow up visit.   Health Maintenance    COVID booster.    HPI:  Pt is a 88 y.o. female seen today for medical management of chronic diseases.    She  continues to reside on the skilled nursing unit at [facility] PMH: aortic insufficiency, diastolic heart failure, CAD, HTN, MI, OSA on CPAP, GERD, hypothyroidism, obesity, and unstable gait.   Vascular dementia- MMSE 28/30, CT head 2019 noted moderate atrophy with moderate small vessel ischemic changes of white matter, sometimes forgets husband has passed, no recent behavioral outbursts, remains on donepezil and lorazepam prn HTN- BUN/creat 16/1.0 05/14/2021, remains on losartan and bystolic Diastolic heart failure- no weight fluctuations, sob, some ankle edema, wears ted hose daily, remains on furosemide and potassium daily, on regular diet OSA- CPAP qhs Depression- no changes in mood, remains on Cymbalta Hypothyroidism- TSH 3.96 09/29/2020, remains on levothyroxine B12 deficiency- hgb 12.9 05/14/2021 remains on cyanocobalamin daily Dry eyes- dry eyes improved, remains on artificial tears prn Rash- located to breasts and skin folds, resolved at this tim, remains on miconazole cream prn  No recent falls or injuries. Ambulates with wheelchair.   Recent blood pressures:  01/10- 133/59  01/03- 130/71  12/27- 143/60  Recent weights:  01/08- 257.2 lbs  12/04- 255.8 lbs  11/09- 256.1 lbs    Past Medical History:  Diagnosis Date   Breast cancer ([place])    Colon polyps    Coronary artery disease 06/24/2006   STEMI inferior with BMS to mid RCA with mid basal inferior wall HK and luminal irrgularities elsewhere   Fracture of femoral neck, right ([place]) 06/13/2011   Fracture of humeral head, right, closed 06/13/2011   GERD (gastroesophageal reflux disease)    Hemorrhoids    Hyperlipemia    Hypertension    LBBB (left bundle branch block)    Macular degeneration    Myocardial infarction ([place])  Obstructive sleep apnea on CPAP    Osteopenia    Osteopenia    Pacemaker 02/25/2014   Rhinitis    Rotator cuff syndrome    SAH (subarachnoid hemorrhage) ([place]) 02/22/2014   Stokes Adams attack  02/22/2014   Ulnar neuropathy    Umbilical hernia    Unstable gait    Wears hearing aid    Per [facility] records   Past Surgical History:  Procedure Laterality Date   BREAST LUMPECTOMY     CATARACT EXTRACTION, BILATERAL     Per [facility]   CORONARY ANGIOPLASTY WITH STENT PLACEMENT  06/2006   Mid RCA BMS   HIP ARTHROPLASTY  06/06/2011   Procedure: ARTHROPLASTY BIPOLAR HIP;  Surgeon: Gearlean Alf;  Location: [facility] ORS;  Service: Orthopedics;  Laterality: Right;   PERMANENT PACEMAKER INSERTION N/A 02/25/2014   Procedure: PERMANENT PACEMAKER INSERTION;  Surgeon: Evans Lance, MD;  Location: [facility] CATH LAB;  Service: Cardiovascular;  Laterality: N/A;   SHOULDER SURGERY     TONSILLECTOMY     Per [facility]    WRIST SURGERY      Allergies  Allergen Reactions   Chlorpheniramine Anaphylaxis   Codeine Anaphylaxis   Nystatin Rash    redness   Amlodipine     Per [facility]     Allergies as of 06/09/2021       Reactions   Chlorpheniramine Anaphylaxis   Codeine Anaphylaxis   Nystatin Rash   redness   Amlodipine    Per [facility]         Medication List        Accurate as of June 09, 2021  9:58 AM. If you have any questions, ask your nurse or doctor.          STOP taking these medications    loratadine 10 MG tablet Commonly known as: CLARITIN Stopped by: Yvonna Alanis, NP       TAKE these medications    acetaminophen 500 MG tablet Commonly known as: TYLENOL Take 500 mg by mouth every 8 (eight) hours as needed.   ARTIFICIAL TEARS OP Apply 1 drop to eye as needed.   aspirin EC 81 MG tablet Take 81 mg by mouth daily.   Chlorhexidine Gluconate 4 % Soln Apply 1 application topically daily. On Tuesday or Friday   cholecalciferol 25 MCG (1000 UNIT) tablet Commonly known as: VITAMIN D Take 3,000 Units by mouth daily. In the morning between 8-11 am   donepezil 10 MG tablet Commonly known as:  Aricept Take 1 tablet (10 mg total) by mouth at bedtime.   DULoxetine 60 MG capsule Commonly known as: CYMBALTA Take 60 mg by mouth daily.   furosemide 40 MG tablet Commonly known as: LASIX Take 40 mg by mouth daily. And prn edema   levothyroxine 25 MCG tablet Commonly known as: SYNTHROID Take 25 mcg by mouth daily before breakfast.   LORazepam 0.5 MG tablet Commonly known as: ATIVAN Take 0.5 tablets (0.25 mg total) by mouth at bedtime.   losartan 100 MG tablet Commonly known as: COZAAR Take 100 mg by mouth daily.   miconazole 2 % cream Commonly known as: MICOTIN Apply 1 application topically as needed (Apply to rash under breasts and abdominal folds as needed).   mineral oil-hydrophilic petrolatum ointment Apply topically as needed for dry skin.   nebivolol 10 MG tablet Commonly known as: Bystolic Take 1 tablet (10 mg total) by mouth daily.   nitroGLYCERIN 0.4  MG SL tablet Commonly known as: NITROSTAT Place 0.4 mg under the tongue every 5 (five) minutes as needed.   potassium chloride SA 20 MEQ tablet Commonly known as: KLOR-CON M Take 20 mEq by mouth daily.   PRESERVISION AREDS 2+MULTI VIT PO Take by mouth 2 (two) times daily.   sodium fluoride 1.1 % Crea dental cream Commonly known as: PREVIDENT 5000 PLUS Place 1 application onto teeth every evening.   UNABLE TO FIND CPAP Machine   vitamin B-12 1000 MCG tablet Commonly known as: CYANOCOBALAMIN Take 1,000 mcg by mouth daily.        Review of Systems  Constitutional:  Negative for activity change, appetite change, chills, fatigue and fever.  HENT:  Negative for congestion, hearing loss and trouble swallowing.   Eyes:  Negative for visual disturbance.  Respiratory:  Negative for cough, shortness of breath and wheezing.   Cardiovascular:  Positive for leg swelling. Negative for chest pain.  Gastrointestinal:  Negative for abdominal distention, abdominal pain, blood in stool, constipation, diarrhea,  nausea and vomiting.  Genitourinary:  Negative for dysuria, frequency and hematuria.  Musculoskeletal:  Positive for arthralgias and gait problem.  Skin:  Positive for rash.  Neurological:  Positive for weakness. Negative for dizziness and headaches.  Psychiatric/Behavioral:  Positive for confusion and dysphoric mood. Negative for sleep disturbance. The patient is not nervous/anxious.    Immunization History  Administered Date(s) Administered   Influenza, High Dose Seasonal PF 02/18/2017, 03/22/2019, 03/14/2020   Influenza,inj,Quad PF,6+ Mos 03/24/2018   Influenza-Unspecified 03/04/2016, 03/18/2020, 02/25/2021   Moderna SARS-COV2 Booster Vaccination 04/03/2020, 01/13/2021   Moderna Sars-Covid-2 Vaccination 06/04/2019, 07/03/2019, 03/04/2021   Pneumococcal Conjugate-13 05/08/2014   Pneumococcal Polysaccharide-23 10/08/2017   Td 07/09/2002, 01/16/2013   Zoster Recombinat (Shingrix) 11/18/2017, 01/17/2018   Zoster, Live 06/30/2010   Pertinent  Health Maintenance Due  Topic Date Due   INFLUENZA VACCINE  Completed   DEXA SCAN  Completed   Fall Risk 06/14/2018 10/18/2018 12/21/2018 07/04/2019 01/15/2021  Falls in the past year? 0 0 0 0 -  Was there an injury with Fall? 0 0 0 0 -  Fall Risk Category Calculator 0 0 0 0 -  Fall Risk Category Low Low Low Low -  Patient Fall Risk Level - Low fall risk - Low fall risk Moderate fall risk  Patient at Risk for Falls Due to - - Impaired mobility - History of fall(s)  Fall risk Follow up - - Falls evaluation completed;Falls prevention discussed - Falls evaluation completed;Follow up appointment   Functional Status Survey:    Vitals:   06/09/21 0940  BP: (!) 133/59  Pulse: 64  Resp: 17  Temp: (!) 97.3 F (36.3 C)  SpO2: 97%  Weight: 257 lb 3.2 oz (116.7 kg)  Height: 5' (1.524 m)   Body mass index is 50.23 kg/m. Physical Exam Vitals reviewed.  Constitutional:      General: She is not in acute distress.    Appearance: She is obese.   HENT:     Head: Normocephalic.     Right Ear: There is no impacted cerumen.     Left Ear: There is no impacted cerumen.     Nose: Nose normal.     Mouth/Throat:     Mouth: Mucous membranes are moist.  Eyes:     General:        Right eye: No discharge.        Left eye: No discharge.  Cardiovascular:     Rate and  Rhythm: Normal rate and regular rhythm.     Pulses: Normal pulses.     Heart sounds: Normal heart sounds. No murmur heard. Pulmonary:     Effort: Pulmonary effort is normal. No respiratory distress.     Breath sounds: Normal breath sounds. No wheezing.  Abdominal:     General: Bowel sounds are normal. There is no distension.     Palpations: Abdomen is soft.     Tenderness: There is no abdominal tenderness.  Musculoskeletal:     Right lower leg: Edema present.     Left lower leg: Edema present.     Comments: Non pitting, ted hose on  Feet:     Right foot:     Toenail Condition: Right toenails are abnormally thick. Fungal disease present.    Left foot:     Toenail Condition: Left toenails are abnormally thick. Fungal disease present. Skin:    General: Skin is warm and dry.     Capillary Refill: Capillary refill takes less than 2 seconds.     Comments: No excoriated skin under breasts or skin folds  Neurological:     General: No focal deficit present.     Mental Status: She is alert. Mental status is at baseline.     Motor: Weakness present.     Gait: Gait abnormal.     Comments: wheelchair  Psychiatric:        Mood and Affect: Mood normal.        Behavior: Behavior normal.        Cognition and Memory: Memory is impaired.     Comments: Very pleasant, follows commands, alert to self/place/situation    Labs reviewed: Recent Labs    09/29/20 0000  NA 141  K 4.6  CL 101  CO2 26*  BUN 14  CREATININE 0.9   No results for input(s): AST, ALT, ALKPHOS, BILITOT, PROT, ALBUMIN in the last 8760 hours. Recent Labs    09/29/20 0000  WBC 7.2  HGB 12.6  HCT 38   PLT 246   Lab Results  Component Value Date   TSH 3.96 09/29/2020   Lab Results  Component Value Date   HGBA1C 6.1 03/01/2019   Lab Results  Component Value Date   CHOL 200 10/18/2019   HDL 44 10/18/2019   LDLCALC 129 10/18/2019   TRIG 137 10/18/2019    Significant Diagnostic Results in last 30 days:  CUP PACEART REMOTE DEVICE CHECK  Result Date: 05/12/2021 Scheduled remote reviewed. Normal device function.  AF burden < 1 %, all < 1 min Next remote 91 days- JJB   Assessment/Plan 1. Vascular dementia without behavioral disturbance ([place]) - no recent behavioral outbursts - cont skilled nursing care - cont Aricept and Ativan prn  2. Primary hypertension - controlled - cont losartan and Bystolic  3. Chronic diastolic heart failure (HCC) - no weight fluctuations, sob, non pitting ankle edema - cont furosemide  4. Obstructive sleep apnea on CPAP -cont CPAP  5. Depression, major, recurrent, in partial remission (HCC) - no mood changes - cont Cymbalta  6. Acquired hypothyroidism - TSH normal - cont levothyroxine  7. B12 deficiency - hgb stable - cont cyanocobalamin  8. Dry eyes - improved - cont artificial tears  9. Fungal rash of torso - no excoriation under skin folds - cont miconazole prn  10. Onychauxis - toenails thick, long and yellow - recommend seeing podiatry    Family/ staff Communication: plan discussed with patient and nurse  Labs/tests ordered:  none

## 2021-07-06 ENCOUNTER — Non-Acute Institutional Stay (SKILLED_NURSING_FACILITY): Payer: Medicare Other | Admitting: Internal Medicine

## 2021-07-06 ENCOUNTER — Encounter: Payer: Self-pay | Admitting: Internal Medicine

## 2021-07-06 DIAGNOSIS — F3341 Major depressive disorder, recurrent, in partial remission: Secondary | ICD-10-CM | POA: Diagnosis not present

## 2021-07-06 DIAGNOSIS — E039 Hypothyroidism, unspecified: Secondary | ICD-10-CM

## 2021-07-06 DIAGNOSIS — F0150 Vascular dementia without behavioral disturbance: Secondary | ICD-10-CM

## 2021-07-06 DIAGNOSIS — I10 Essential (primary) hypertension: Secondary | ICD-10-CM

## 2021-07-06 DIAGNOSIS — G4733 Obstructive sleep apnea (adult) (pediatric): Secondary | ICD-10-CM | POA: Diagnosis not present

## 2021-07-06 DIAGNOSIS — Z9989 Dependence on other enabling machines and devices: Secondary | ICD-10-CM | POA: Diagnosis not present

## 2021-07-06 DIAGNOSIS — I5032 Chronic diastolic (congestive) heart failure: Secondary | ICD-10-CM

## 2021-07-06 DIAGNOSIS — E538 Deficiency of other specified B group vitamins: Secondary | ICD-10-CM | POA: Diagnosis not present

## 2021-07-06 NOTE — Progress Notes (Signed)
Location:    [place] Room Number: 161 Place of Service:  SNF [phone]) Provider:  Veleta Miners MD  Virgie Dad, MD  Patient Care Team: Virgie Dad, MD as PCP - General (Internal Medicine) Sueanne Margarita, MD as PCP - Sleep Medicine (Sleep Medicine) Evans Lance, MD as PCP - Cardiology (Cardiology) Justice Britain, MD as Consulting Physician (Orthopedic Surgery) Gaynelle Arabian, MD as Consulting Physician (Orthopedic Surgery) Garvin Fila, MD as Consulting Physician (Neurology) Evans Lance, MD as Consulting Physician (Cardiology) Jettie Booze, MD as Consulting Physician (Interventional Cardiology) Shon Hough, MD as Consulting Physician (Ophthalmology) Community, [facility] ([place]) Royal Hawthorn, NP as Nurse Practitioner (Nurse Practitioner)  Extended Emergency Contact Information Primary Emergency Contact: Sinn,JED Address: [place], [place] of [place] Phone: [phone] Mobile Phone: [phone] Relation: Son Secondary Emergency Contact: [place] Mobile Phone: [phone] Relation: Daughter  Code Status:  DNR Goals of care: Advanced Directive information Advanced Directives 07/06/2021  Does Patient Have a Medical Advance Directive? Yes  Type of Advance Directive Out of facility DNR (pink MOST or yellow form)  Does patient want to make changes to medical advance directive? No - Patient declined  Copy of [place] in Chart? -  Pre-existing out of facility DNR order (yellow form or pink MOST form) Yellow form placed in chart (order not valid for inpatient use)     Chief Complaint  Patient presents with   Medical Management of Chronic Issues    HPI:  Pt is a 88 y.o. female seen today for medical management of chronic diseases.    Patient has h/o Chronic Diastolic CHF, Urinary Incontinence, HTN,  Osteoporosis, HLD, B 12 Def, anemia, H/o  Breast cancer and Vascular Dementia  h/o SAH and OSA Has h/o Breast lump No Work up per her request  She had Tooth Pain few days ago and was seen by [facility] who started her on  On Amoxicillin She is feeling better since then   Otherwise She is stable. No new Nursing issues. No Behavior issues Her weight is stable Mostly Wheelchair Dependent No Falls Wt Readings from Last 3 Encounters:  07/06/21 252 lb 9.6 oz (114.6 kg)  06/09/21 257 lb 3.2 oz (116.7 kg)  05/12/21 254 lb 4.8 oz (115.3 kg)   Past Medical History:  Diagnosis Date   Breast cancer ([place])    Colon polyps    Coronary artery disease 06/24/2006   STEMI inferior with BMS to mid RCA with mid basal inferior wall HK and luminal irrgularities elsewhere   Fracture of femoral neck, right ([place]) 06/13/2011   Fracture of humeral head, right, closed 06/13/2011   GERD (gastroesophageal reflux disease)    Hemorrhoids    Hyperlipemia    Hypertension    LBBB (left bundle branch block)    Macular degeneration    Myocardial infarction ([place])    Obstructive sleep apnea on CPAP    Osteopenia    Osteopenia    Pacemaker 02/25/2014   Rhinitis    Rotator cuff syndrome    SAH (subarachnoid hemorrhage) ([place]) 02/22/2014   Stokes Adams attack 02/22/2014   Ulnar neuropathy    Umbilical hernia    Unstable gait    Wears hearing aid    Per [facility] records   Past Surgical History:  Procedure Laterality Date   BREAST LUMPECTOMY     CATARACT EXTRACTION, BILATERAL  Per [facility]   CORONARY ANGIOPLASTY WITH STENT PLACEMENT  06/2006   Mid RCA BMS   HIP ARTHROPLASTY  06/06/2011   Procedure: ARTHROPLASTY BIPOLAR HIP;  Surgeon: Gearlean Alf;  Location: [facility] ORS;  Service: Orthopedics;  Laterality: Right;   PERMANENT PACEMAKER INSERTION N/A 02/25/2014   Procedure: PERMANENT PACEMAKER INSERTION;  Surgeon: Evans Lance, MD;  Location: [facility] CATH LAB;  Service: Cardiovascular;  Laterality:  N/A;   SHOULDER SURGERY     TONSILLECTOMY     Per [facility]    WRIST SURGERY      Allergies  Allergen Reactions   Chlorpheniramine Anaphylaxis   Codeine Anaphylaxis   Nystatin Rash    redness   Amlodipine     Per [facility]     Allergies as of 07/06/2021       Reactions   Chlorpheniramine Anaphylaxis   Codeine Anaphylaxis   Nystatin Rash   redness   Amlodipine    Per [facility]         Medication List        Accurate as of July 06, 2021  1:10 PM. If you have any questions, ask your nurse or doctor.          acetaminophen 500 MG tablet Commonly known as: TYLENOL Take 500 mg by mouth every 8 (eight) hours as needed.   amoxicillin 500 MG tablet Commonly known as: AMOXIL Take 500 mg by mouth 3 (three) times daily.   ARTIFICIAL TEARS OP Apply 1 drop to eye as needed.   aspirin EC 81 MG tablet Take 81 mg by mouth daily.   Chlorhexidine Gluconate 4 % Soln Apply 1 application topically daily. On Tuesday or Friday   cholecalciferol 25 MCG (1000 UNIT) tablet Commonly known as: VITAMIN D Take 3,000 Units by mouth daily. In the morning between 8-11 am   donepezil 10 MG tablet Commonly known as: Aricept Take 1 tablet (10 mg total) by mouth at bedtime.   DULoxetine 60 MG capsule Commonly known as: CYMBALTA Take 60 mg by mouth daily.   furosemide 40 MG tablet Commonly known as: LASIX Take 40 mg by mouth daily. And prn edema   levothyroxine 25 MCG tablet Commonly known as: SYNTHROID Take 25 mcg by mouth daily before breakfast.   LORazepam 0.5 MG tablet Commonly known as: ATIVAN Take 0.5 tablets (0.25 mg total) by mouth at bedtime.   losartan 100 MG tablet Commonly known as: COZAAR Take 100 mg by mouth daily.   miconazole 2 % cream Commonly known as: MICOTIN Apply 1 application topically as needed (Apply to rash under breasts and abdominal folds as needed).   mineral oil-hydrophilic petrolatum  ointment Apply topically as needed for dry skin.   nebivolol 10 MG tablet Commonly known as: Bystolic Take 1 tablet (10 mg total) by mouth daily.   nitroGLYCERIN 0.4 MG SL tablet Commonly known as: NITROSTAT Place 0.4 mg under the tongue every 5 (five) minutes as needed.   potassium chloride SA 20 MEQ tablet Commonly known as: KLOR-CON M Take 20 mEq by mouth daily.   PRESERVISION AREDS 2+MULTI VIT PO Take by mouth 2 (two) times daily.   sodium fluoride 1.1 % Crea dental cream Commonly known as: PREVIDENT 5000 PLUS Place 1 application onto teeth every evening.   UNABLE TO FIND CPAP Machine   vitamin B-12 1000 MCG tablet Commonly known as: CYANOCOBALAMIN Take 1,000 mcg by mouth daily.  Review of Systems  Constitutional:  Negative for activity change and appetite change.  HENT: Negative.    Respiratory:  Negative for cough and shortness of breath.   Cardiovascular:  Positive for leg swelling.  Gastrointestinal:  Negative for constipation.  Genitourinary: Negative.   Musculoskeletal:  Positive for gait problem. Negative for arthralgias and myalgias.  Skin: Negative.   Neurological:  Negative for dizziness and weakness.  Psychiatric/Behavioral:  Positive for confusion. Negative for dysphoric mood and sleep disturbance.    Immunization History  Administered Date(s) Administered   Influenza, High Dose Seasonal PF 02/18/2017, 03/22/2019, 03/14/2020   Influenza,inj,Quad PF,6+ Mos 03/24/2018   Influenza-Unspecified 03/04/2016, 03/18/2020, 02/25/2021   Moderna SARS-COV2 Booster Vaccination 04/03/2020, 01/13/2021   Moderna Sars-Covid-2 Vaccination 06/04/2019, 07/03/2019, 03/04/2021   Pneumococcal Conjugate-13 05/08/2014   Pneumococcal Polysaccharide-23 10/08/2017   Td 07/09/2002, 01/16/2013   Zoster Recombinat (Shingrix) 11/18/2017, 01/17/2018   Zoster, Live 06/30/2010   Pertinent  Health Maintenance Due  Topic Date Due   INFLUENZA VACCINE  Completed   DEXA SCAN   Completed   Fall Risk 06/14/2018 10/18/2018 12/21/2018 07/04/2019 01/15/2021  Falls in the past year? 0 0 0 0 -  Was there an injury with Fall? 0 0 0 0 -  Fall Risk Category Calculator 0 0 0 0 -  Fall Risk Category Low Low Low Low -  Patient Fall Risk Level - Low fall risk - Low fall risk Moderate fall risk  Patient at Risk for Falls Due to - - Impaired mobility - History of fall(s)  Fall risk Follow up - - Falls evaluation completed;Falls prevention discussed - Falls evaluation completed;Follow up appointment   Functional Status Survey:    Vitals:   07/06/21 1303  BP: 129/73  Pulse: 65  Resp: 18  Temp: (!) 97 F (36.1 C)  SpO2: 95%  Weight: 252 lb 9.6 oz (114.6 kg)  Height: 5' (1.524 m)   Body mass index is 49.33 kg/m. Physical Exam Vitals reviewed.  Constitutional:      Appearance: Normal appearance.  HENT:     Head: Normocephalic.     Nose: Nose normal.     Mouth/Throat:     Mouth: Mucous membranes are moist.     Pharynx: Oropharynx is clear.  Eyes:     Pupils: Pupils are equal, round, and reactive to light.  Cardiovascular:     Rate and Rhythm: Normal rate and regular rhythm.     Pulses: Normal pulses.     Heart sounds: Normal heart sounds. No murmur heard. Pulmonary:     Effort: Pulmonary effort is normal.     Breath sounds: Normal breath sounds.  Abdominal:     General: Abdomen is flat. Bowel sounds are normal.     Palpations: Abdomen is soft.  Musculoskeletal:        General: Swelling present.     Cervical back: Neck supple.  Skin:    General: Skin is warm.  Neurological:     General: No focal deficit present.     Mental Status: She is alert.  Psychiatric:        Mood and Affect: Mood normal.        Thought Content: Thought content normal.    Labs reviewed: Recent Labs    09/29/20 0000 05/14/21 0000  NA 141 139  K 4.6 4.5  CL 101 100  CO2 26* 27*  BUN 14 16  CREATININE 0.9 1.0  CALCIUM  --  9.7   Recent Labs  05/14/21 0000  AST 18   ALT 11  ALKPHOS 113  ALBUMIN 3.9   Recent Labs    09/29/20 0000 05/14/21 0000  WBC 7.2 8.4  NEUTROABS  --  4.70  HGB 12.6 12.9  HCT 38 39  PLT 246 244   Lab Results  Component Value Date   TSH 3.96 09/29/2020   Lab Results  Component Value Date   HGBA1C 6.1 03/01/2019   Lab Results  Component Value Date   CHOL 200 10/18/2019   HDL 44 10/18/2019   LDLCALC 129 10/18/2019   TRIG 137 10/18/2019    Significant Diagnostic Results in last 30 days:  No results found.  Assessment/Plan 1. Vascular dementia without behavioral disturbance (HCC) Supportive care On Aricept and Aspirin Staying stable  2. Primary hypertension On Losartan and Bystolic  3. Chronic diastolic heart failure (HCC) Continue Lasix  4. Obstructive sleep apnea on CPAP ON CPAP  5. Depression, major, recurrent, in partial remission (HCC) Mood stable on Cymbalta and Ativan PRN  6. Acquired hypothyroidism TSH normal in 5/22  7. B12 deficiency On Supplement 8 Tooth Infection Amoxicillin per Dentist    Family/ staff Communication:   Labs/tests ordered:

## 2021-07-23 ENCOUNTER — Other Ambulatory Visit: Payer: Self-pay | Admitting: *Deleted

## 2021-07-23 MED ORDER — LORAZEPAM 0.5 MG PO TABS: 0.2500 mg | ORAL_TABLET | Freq: Every day | ORAL | 2 refills | Status: DC

## 2021-07-23 NOTE — Telephone Encounter (Signed)
Pharmacy requested refill.  ?Epic LR: 04/24/2021 ?Pended Rx and sent to [facility] for approval.  ?

## 2021-07-28 ENCOUNTER — Encounter: Payer: Self-pay | Admitting: Orthopedic Surgery

## 2021-07-28 ENCOUNTER — Non-Acute Institutional Stay (SKILLED_NURSING_FACILITY): Payer: Medicare Other | Admitting: Orthopedic Surgery

## 2021-07-28 DIAGNOSIS — E039 Hypothyroidism, unspecified: Secondary | ICD-10-CM | POA: Diagnosis not present

## 2021-07-28 DIAGNOSIS — F0150 Vascular dementia without behavioral disturbance: Secondary | ICD-10-CM

## 2021-07-28 DIAGNOSIS — Z9989 Dependence on other enabling machines and devices: Secondary | ICD-10-CM | POA: Diagnosis not present

## 2021-07-28 DIAGNOSIS — B369 Superficial mycosis, unspecified: Secondary | ICD-10-CM | POA: Diagnosis not present

## 2021-07-28 DIAGNOSIS — H6122 Impacted cerumen, left ear: Secondary | ICD-10-CM

## 2021-07-28 DIAGNOSIS — I10 Essential (primary) hypertension: Secondary | ICD-10-CM | POA: Diagnosis not present

## 2021-07-28 DIAGNOSIS — E538 Deficiency of other specified B group vitamins: Secondary | ICD-10-CM

## 2021-07-28 DIAGNOSIS — R634 Abnormal weight loss: Secondary | ICD-10-CM | POA: Diagnosis not present

## 2021-07-28 DIAGNOSIS — F3341 Major depressive disorder, recurrent, in partial remission: Secondary | ICD-10-CM | POA: Diagnosis not present

## 2021-07-28 DIAGNOSIS — I5032 Chronic diastolic (congestive) heart failure: Secondary | ICD-10-CM

## 2021-07-28 DIAGNOSIS — G4733 Obstructive sleep apnea (adult) (pediatric): Secondary | ICD-10-CM

## 2021-07-28 NOTE — Progress Notes (Signed)
?  Subjective:  ?  ? Patient ID: [name], female   DOB: 1928/09/01, 88 y.o.   MRN: [id number] ? ?HPI ? ?Pt seen today for routine visit. Resides of SNF unit at [facility] and was seen finishing a meal in the dining area.  ? ?Pt is oriented to self and place and has no acute complaints today. Very pleasant and cooperative. Ambulates with wheelchair. ? ?Recently seen by dentist in February for tooth pain and completed a course of amoxicillin. Reports that pain is significantly improved now, and she only notices it when she eats certain foods. Has a very good appetite.  ? ? ?Review of Systems  ?Constitutional:  Negative for activity change and appetite change.  ?HENT:  Positive for dental problem. Negative for mouth sores and trouble swallowing.   ?Respiratory:  Negative for shortness of breath.   ?Cardiovascular:  Positive for leg swelling. Negative for chest pain.  ?Genitourinary:  Negative for dysuria.  ?Musculoskeletal:  Positive for gait problem.  ?Psychiatric/Behavioral:  Positive for confusion. Negative for agitation and behavioral problems.   ? ?   ?Objective:  ? Physical Exam ?Constitutional:   ?   General: She is not in acute distress. ?   Appearance: Normal appearance.  ?HENT:  ?   Right Ear: There is no impacted cerumen.  ?   Left Ear: There is impacted cerumen.  ?Eyes:  ?   Conjunctiva/sclera: Conjunctivae normal.  ?Cardiovascular:  ?   Rate and Rhythm: Normal rate and regular rhythm.  ?   Pulses: Normal pulses.  ?   Heart sounds: Normal heart sounds. No murmur heard. ?Pulmonary:  ?   Effort: Pulmonary effort is normal.  ?   Breath sounds: Normal breath sounds.  ?Abdominal:  ?   General: Bowel sounds are normal.  ?   Palpations: Abdomen is soft.  ?   Tenderness: There is no abdominal tenderness.  ?Musculoskeletal:  ?   Right lower leg: Edema present.  ?   Left lower leg: Edema present.  ?   Comments: Non-pitting  ?Neurological:  ?   Mental Status: She is alert. Mental status is at baseline.  ?    Gait: Gait abnormal.  ?Psychiatric:     ?   Mood and Affect: Mood normal.     ?   Cognition and Memory: Memory is impaired.  ? ? ?   ?Assessment/ Plan:  ?   ?1. Vascular dementia without behavioral disturbance ([place]) ?- Ambulates with wheelchair, good appetite ?- MMSE 28/30 ?- Continue SNF setting ?- Continue Aricept ? ?2. Primary hypertension ?- Controlled ?- Continue losartan and bystolic ?- BUN 70/ Cr 1/0 05/14/2021 ? ?3. Acquired hypothyroidism ?- TSH 3.96 09/29/2020 ?- Continue synthroid ? ?4. B12 deficiency ?- Hgb 12.9 05/14/2021 ?- Continue cyanocobalamin ? ?5. Impacted cerumen of left ear ?- Debrox per [facility] protocol ? ?6. Chronic diastolic heart failure ?- Non-pitting edema to BLE ?- Continue ted hose ?- Continue lasix ? ?7. Depression ?- Continue Cymbalta ? ?8. Age-related osteoporosis ?- Continue Vitamin D ?   ?   ? ?   ?

## 2021-07-28 NOTE — Progress Notes (Signed)
Location:  [place] Room Number: 124 Place of Service:  SNF [phone]) Provider:  Windell Moulding, AGNP-C  Virgie Dad, MD  Patient Care Team: Virgie Dad, MD as PCP - General (Internal Medicine) Sueanne Margarita, MD as PCP - Sleep Medicine (Sleep Medicine) Evans Lance, MD as PCP - Cardiology (Cardiology) Justice Britain, MD as Consulting Physician (Orthopedic Surgery) Gaynelle Arabian, MD as Consulting Physician (Orthopedic Surgery) Garvin Fila, MD as Consulting Physician (Neurology) Evans Lance, MD as Consulting Physician (Cardiology) Jettie Booze, MD as Consulting Physician (Interventional Cardiology) Shon Hough, MD as Consulting Physician (Ophthalmology) Community, [facility] ([place]) Royal Hawthorn, NP as Nurse Practitioner (Nurse Practitioner)  Extended Emergency Contact Information Primary Emergency Contact: [place]on[place],JED Address: [place], [place] Phone: [phone] Mobile Phone: [phone] Relation: Son Secondary Emergency Contact: Kinsey Mobile Phone: [phone] Relation: Daughter  Code Status:  DNR Goals of care: Advanced Directive information Advanced Directives 07/06/2021  Does Patient Have a Medical Advance Directive? Yes  Type of Advance Directive Out of facility DNR (pink MOST or yellow form)  Does patient want to make changes to medical advance directive? No - Patient declined  Copy of [place] in Chart? -  Pre-existing out of facility DNR order (yellow form or pink MOST form) Yellow form placed in chart (order not valid for inpatient use)     Chief Complaint  Patient presents with   Medical Management of Chronic Issues    HPI:  Pt is a 88 y.o. female seen today for medical management of chronic diseases.    She continues to reside on the skilled nursing unit at [facility] PMH: aortic  insufficiency, diastolic heart failure, CAD, HTN, MI, OSA on CPAP, GERD, hypothyroidism, obesity, h/o breast cancer and unstable gait.    Vascular dementia- MMSE 28/30, CT head 2019 noted moderate atrophy with moderate small vessel ischemic changes of white matter, no recent behavioral outbursts, remains on donepezil and lorazepam prn HTN- BUN/creat 16/1.0 05/14/2021, remains on losartan and bystolic Diastolic heart failure- no weight fluctuations, sob, mild ankle edema, wears ted hose daily, remains on furosemide and potassium daily, regular diet OSA- CPAP qhs Depression- no changes in mood, very social with others and attends activities offered, remains on Cymbalta Hypothyroidism- TSH 3.96 09/29/2020, remains on levothyroxine B12 deficiency- hgb 12.9 05/14/2021 remains on cyanocobalamin daily Fungal rash- redness under left breast/armpit x 4 weeks, remains on miconazole cream prn Weight loss- see weight trends below  No recent falls or injuries. Ambulates with wheelchair.   Recent blood pressures:  02/28- 126/88  02/21- 129/72  02/14- 111/70  Recent weights:  03/04- 249.4 lbs  02/01- 253.6 lbs  01/01- 259.8 lbs   Past Medical History:  Diagnosis Date   Breast cancer ([place])    Colon polyps    Coronary artery disease 06/24/2006   STEMI inferior with BMS to mid RCA with mid basal inferior wall HK and luminal irrgularities elsewhere   Fracture of femoral neck, right ([place]) 06/13/2011   Fracture of humeral head, right, closed 06/13/2011   GERD (gastroesophageal reflux disease)    Hemorrhoids    Hyperlipemia    Hypertension    LBBB (left bundle branch block)    Macular degeneration    Myocardial infarction ([place])    Obstructive sleep apnea on CPAP    Osteopenia    Osteopenia  Pacemaker 02/25/2014   Rhinitis    Rotator cuff syndrome    SAH (subarachnoid hemorrhage) ([place]) 02/22/2014   Stokes Adams attack 02/22/2014   Ulnar neuropathy    Umbilical hernia    Unstable gait     Wears hearing aid    Per [facility] records   Past Surgical History:  Procedure Laterality Date   BREAST LUMPECTOMY     CATARACT EXTRACTION, BILATERAL     Per [facility]   CORONARY ANGIOPLASTY WITH STENT PLACEMENT  06/2006   Mid RCA BMS   HIP ARTHROPLASTY  06/06/2011   Procedure: ARTHROPLASTY BIPOLAR HIP;  Surgeon: Gearlean Alf;  Location: [facility] ORS;  Service: Orthopedics;  Laterality: Right;   PERMANENT PACEMAKER INSERTION N/A 02/25/2014   Procedure: PERMANENT PACEMAKER INSERTION;  Surgeon: Evans Lance, MD;  Location: [facility] CATH LAB;  Service: Cardiovascular;  Laterality: N/A;   SHOULDER SURGERY     TONSILLECTOMY     Per [facility]    WRIST SURGERY      Allergies  Allergen Reactions   Chlorpheniramine Anaphylaxis   Codeine Anaphylaxis   Nystatin Rash    redness   Amlodipine     Per [facility]     Outpatient Encounter Medications as of 07/28/2021  Medication Sig   acetaminophen (TYLENOL) 500 MG tablet Take 500 mg by mouth every 8 (eight) hours as needed.    aspirin EC 81 MG tablet Take 81 mg by mouth daily.   Carboxymethylcellulose Sodium (ARTIFICIAL TEARS OP) Apply 1 drop to eye as needed.   Chlorhexidine Gluconate 4 % SOLN Apply 1 application topically daily. On Tuesday or Friday   cholecalciferol (VITAMIN D) 25 MCG (1000 UNIT) tablet Take 3,000 Units by mouth daily. In the morning between 8-11 am   donepezil (ARICEPT) 10 MG tablet Take 1 tablet (10 mg total) by mouth at bedtime.   DULoxetine (CYMBALTA) 60 MG capsule Take 60 mg by mouth daily.   furosemide (LASIX) 40 MG tablet Take 40 mg by mouth daily. And prn edema   levothyroxine (SYNTHROID, LEVOTHROID) 25 MCG tablet Take 25 mcg by mouth daily before breakfast.    LORazepam (ATIVAN) 0.5 MG tablet Take 0.5 tablets (0.25 mg total) by mouth at bedtime.   losartan (COZAAR) 100 MG tablet Take 100 mg by mouth daily.   miconazole (MICOTIN) 2 % cream Apply 1 application topically as needed  (Apply to rash under breasts and abdominal folds as needed).   mineral oil-hydrophilic petrolatum (AQUAPHOR) ointment Apply topically as needed for dry skin.   Multiple Vitamins-Minerals (PRESERVISION AREDS 2+MULTI VIT PO) Take by mouth 2 (two) times daily.   nebivolol (BYSTOLIC) 10 MG tablet Take 1 tablet (10 mg total) by mouth daily.   nitroGLYCERIN (NITROSTAT) 0.4 MG SL tablet Place 0.4 mg under the tongue every 5 (five) minutes as needed.   potassium chloride SA (K-DUR,KLOR-CON) 20 MEQ tablet Take 20 mEq by mouth daily.    sodium fluoride (PREVIDENT 5000 PLUS) 1.1 % CREA dental cream Place 1 application onto teeth every evening.   UNABLE TO FIND CPAP Machine   vitamin B-12 (CYANOCOBALAMIN) 1000 MCG tablet Take 1,000 mcg by mouth daily.   No facility-administered encounter medications on file as of 07/28/2021.    Review of Systems  Unable to perform ROS: Dementia   Immunization History  Administered Date(s) Administered   Influenza, High Dose Seasonal PF 02/18/2017, 03/22/2019, 03/14/2020   Influenza,inj,Quad PF,6+ Mos 03/24/2018   Influenza-Unspecified 03/04/2016, 03/18/2020, 02/25/2021  Moderna SARS-COV2 Booster Vaccination 04/03/2020, 01/13/2021   Moderna Sars-Covid-2 Vaccination 06/04/2019, 07/03/2019, 03/04/2021   Pneumococcal Conjugate-13 05/08/2014   Pneumococcal Polysaccharide-23 10/08/2017   Td 07/09/2002, 01/16/2013   Zoster Recombinat (Shingrix) 11/18/2017, 01/17/2018   Zoster, Live 06/30/2010   Pertinent  Health Maintenance Due  Topic Date Due   INFLUENZA VACCINE  Completed   DEXA SCAN  Completed   Fall Risk 06/14/2018 10/18/2018 12/21/2018 07/04/2019 01/15/2021  Falls in the past year? 0 0 0 0 -  Was there an injury with Fall? 0 0 0 0 -  Fall Risk Category Calculator 0 0 0 0 -  Fall Risk Category Low Low Low Low -  Patient Fall Risk Level - Low fall risk - Low fall risk Moderate fall risk  Patient at Risk for Falls Due to - - Impaired mobility - History of fall(s)   Fall risk Follow up - - Falls evaluation completed;Falls prevention discussed - Falls evaluation completed;Follow up appointment   Functional Status Survey:    Vitals:   07/28/21 1232  BP: 126/88  Pulse: 70  Resp: 18  Temp: 98.3 F (36.8 C)  SpO2: 92%  Weight: 249 lb 6.4 oz (113.1 kg)   Body mass index is 48.71 kg/m. Physical Exam Vitals reviewed.  Constitutional:      General: She is not in acute distress.    Appearance: She is obese.  HENT:     Head: Normocephalic.     Right Ear: There is no impacted cerumen.     Left Ear: There is impacted cerumen.     Nose: Nose normal.     Mouth/Throat:     Mouth: Mucous membranes are moist.  Eyes:     General:        Right eye: No discharge.        Left eye: No discharge.  Neck:     Vascular: No carotid bruit.  Cardiovascular:     Rate and Rhythm: Normal rate and regular rhythm.     Pulses: Normal pulses.     Heart sounds: Normal heart sounds.  Pulmonary:     Effort: Pulmonary effort is normal. No respiratory distress.     Breath sounds: Normal breath sounds. No wheezing.  Abdominal:     General: Bowel sounds are normal. There is no distension.     Palpations: Abdomen is soft.     Tenderness: There is no abdominal tenderness.  Musculoskeletal:     Cervical back: Neck supple.     Right lower leg: Edema present.     Left lower leg: Edema present.     Comments: Non pitting  Lymphadenopathy:     Cervical: No cervical adenopathy.  Skin:    General: Skin is warm and dry.     Capillary Refill: Capillary refill takes less than 2 seconds.     Findings: Rash present.     Comments: Excoriated skin under left breast auxilla  Neurological:     General: No focal deficit present.     Mental Status: She is alert. Mental status is at baseline.     Motor: Weakness present.     Gait: Gait abnormal.     Comments: wheelchair  Psychiatric:        Mood and Affect: Mood normal.        Behavior: Behavior normal.     Comments: Very  pleasant, follows commands, alert to self, person and place    Labs reviewed: Recent Labs    09/29/20 0000 05/14/21  0000  NA 141 139  K 4.6 4.5  CL 101 100  CO2 26* 27*  BUN 14 16  CREATININE 0.9 1.0  CALCIUM  --  9.7   Recent Labs    05/14/21 0000  AST 18  ALT 11  ALKPHOS 113  ALBUMIN 3.9   Recent Labs    09/29/20 0000 05/14/21 0000  WBC 7.2 8.4  NEUTROABS  --  4.70  HGB 12.6 12.9  HCT 38 39  PLT 246 244   Lab Results  Component Value Date   TSH 3.96 09/29/2020   Lab Results  Component Value Date   HGBA1C 6.1 03/01/2019   Lab Results  Component Value Date   CHOL 200 10/18/2019   HDL 44 10/18/2019   LDLCALC 129 10/18/2019   TRIG 137 10/18/2019    Significant Diagnostic Results in last 30 days:  No results found.  Assessment/Plan 1. Vascular dementia without behavioral disturbance (HCC) - no behavioral outbursts - ambulates with wheelchair - cont skilled nursing care - cont Aricept and Ativan  2. Primary hypertension - controlled  - cont losartan and Bystolic  3. Chronic diastolic heart failure (H[facility]) - no weight fluctuations or sob - non pitting edema to BLE - cont ted hose  - cont furosemide  4. Obstructive sleep apnea on CPAP - cont CPAP qhs  5. Depression, major, recurrent, in partial remission ([place]) - no mood changes, very social with others - cont Cymbalta  6. Acquired hypothyroidism - TSH normal - cont levothyroxine  7. B12 deficiency - hgb stable - cont cyanocobalamin  8. Fungal rash of torso - involved left breast and axilla x 4 weeks - recommend miconazole cream bid x 7 days, then prn  9. Weight loss - down 10 lbs since 05/2021 - cont monthly weights  10. Impacted cerumen of left ear - cannot visualize TM - start [facility] debrox protocol    Family/ staff Communication: plan discussed with patient and nurse  Labs/tests ordered:  none

## 2021-08-11 ENCOUNTER — Ambulatory Visit (INDEPENDENT_AMBULATORY_CARE_PROVIDER_SITE_OTHER): Payer: Medicare Other

## 2021-08-11 DIAGNOSIS — R55 Syncope and collapse: Secondary | ICD-10-CM

## 2021-08-13 LAB — CUP PACEART REMOTE DEVICE CHECK
Battery Remaining Longevity: 31 mo
Battery Remaining Percentage: 27 %
Battery Voltage: 2.89 V
Brady Statistic AP VP Percent: 68 %
Brady Statistic AP VS Percent: 1 %
Brady Statistic AS VP Percent: 30 %
Brady Statistic AS VS Percent: 1.2 %
Brady Statistic RA Percent Paced: 67 %
Brady Statistic RV Percent Paced: 97 %
Date Time Interrogation Session: 20230322203503
Implantable Lead Implant Date: 20151005
Implantable Lead Implant Date: 20151005
Implantable Lead Location: 753859
Implantable Lead Location: 753860
Implantable Pulse Generator Implant Date: 20151005
Lead Channel Impedance Value: 440 Ohm
Lead Channel Impedance Value: 590 Ohm
Lead Channel Pacing Threshold Amplitude: 0.75 V
Lead Channel Pacing Threshold Amplitude: 0.875 V
Lead Channel Pacing Threshold Pulse Width: 0.5 ms
Lead Channel Pacing Threshold Pulse Width: 0.5 ms
Lead Channel Sensing Intrinsic Amplitude: 12 mV
Lead Channel Sensing Intrinsic Amplitude: 5 mV
Lead Channel Setting Pacing Amplitude: 1 V
Lead Channel Setting Pacing Amplitude: 1.875
Lead Channel Setting Pacing Pulse Width: 0.5 ms
Lead Channel Setting Sensing Sensitivity: 4 mV
Pulse Gen Model: 2240
Pulse Gen Serial Number: 7666288

## 2021-08-18 ENCOUNTER — Encounter: Payer: Self-pay | Admitting: Orthopedic Surgery

## 2021-08-18 DIAGNOSIS — Z0000 Encounter for general adult medical examination without abnormal findings: Secondary | ICD-10-CM

## 2021-08-18 NOTE — Progress Notes (Signed)
This encounter was created in error - please disregard.

## 2021-08-25 NOTE — Progress Notes (Signed)
Remote pacemaker transmission.   

## 2021-08-27 ENCOUNTER — Encounter: Payer: Self-pay | Admitting: Adult Health

## 2021-08-27 ENCOUNTER — Non-Acute Institutional Stay (SKILLED_NURSING_FACILITY): Payer: Medicare Other | Admitting: Adult Health

## 2021-08-27 DIAGNOSIS — I459 Conduction disorder, unspecified: Secondary | ICD-10-CM

## 2021-08-27 DIAGNOSIS — G4733 Obstructive sleep apnea (adult) (pediatric): Secondary | ICD-10-CM | POA: Diagnosis not present

## 2021-08-27 DIAGNOSIS — F0150 Vascular dementia without behavioral disturbance: Secondary | ICD-10-CM

## 2021-08-27 DIAGNOSIS — I10 Essential (primary) hypertension: Secondary | ICD-10-CM | POA: Diagnosis not present

## 2021-08-27 DIAGNOSIS — I872 Venous insufficiency (chronic) (peripheral): Secondary | ICD-10-CM

## 2021-08-27 DIAGNOSIS — I5032 Chronic diastolic (congestive) heart failure: Secondary | ICD-10-CM | POA: Diagnosis not present

## 2021-08-27 DIAGNOSIS — Z9989 Dependence on other enabling machines and devices: Secondary | ICD-10-CM | POA: Diagnosis not present

## 2021-08-27 DIAGNOSIS — E039 Hypothyroidism, unspecified: Secondary | ICD-10-CM

## 2021-08-27 NOTE — Progress Notes (Signed)
?Location:  [place] ?Nursing Home Room Number: 188-C ?Place of Service:  SNF (31) ?Provider:  Royal Hawthorn, NP  ? ?Patient Care Team: ?Virgie Dad, MD as PCP - General (Internal Medicine) ?Sueanne Margarita, MD as PCP - Sleep Medicine (Sleep Medicine) ?Evans Lance, MD as PCP - Cardiology (Cardiology) ?Justice Britain, MD as Consulting Physician (Orthopedic Surgery) ?Gaynelle Arabian, MD as Consulting Physician (Orthopedic Surgery) ?Garvin Fila, MD as Consulting Physician (Neurology) ?Evans Lance, MD as Consulting Physician (Cardiology) ?Jettie Booze, MD as Consulting Physician (Interventional Cardiology) ?Shon Hough, MD as Consulting Physician (Ophthalmology) ?Community[facility] ([place]) ?Royal Hawthorn, NP as Nurse Practitioner (Nurse Practitioner) ? ?Extended Emergency Contact Information ?Primary Emergency Contact: Channing,JED ?Address: [address] ?         [place], [place] ?Home Phone: [phone] ?Mobile Phone: [phone] ?Relation: Son ?Secondary Emergency Contact: MCGARRY,LEE ?Mobile Phone: [phone] ?Relation: Daughter ? ?Code Status:  DNR ?Goals of care: Advanced Directive information ? ?  08/27/2021  ?  3:13 PM  ?Advanced Directives  ?Does Patient Have a Medical Advance Directive? Yes  ?Type of Advance Directive Out of facility DNR (pink MOST or yellow form)  ?Does patient want to make changes to medical advance directive? No - Patient declined  ?Pre-existing out of facility DNR order (yellow form or pink MOST form) Yellow form placed in chart (order not valid for inpatient use)  ? ? ? ?Chief Complaint  ?Patient presents with  ? Medical Management of Chronic Issues  ?  Routine visit   ? ? ?HPI:  ?[name] is a 88 y.o. female seen today for medical management of chronic diseases.   ? PMH significant for breast ca, CAD with stent, subarachnoid hemorrhage, dementia, incontinence, obesity, OP, HTN,  diastolic CHF, GERD, pacemaker, among others. Has a hx of breast lump as well that her family declined mammogram for due to her age and dementia.  ?She turned 37 in Dec and said that she is very grateful for her life and family. She lost her husband in the passed year and is still grieving the loss but seems to be doing better. She went through a brief period of weight loss but now her weight is now trending back up again. She is not compliant with dietary modifications.  ?Wt Readings from Last 3 Encounters:  ?08/27/21 252 lb (114.3 kg)  ?08/18/21 249 lb 6.4 oz (113.1 kg)  ?07/28/21 249 lb 6.4 oz (113.1 kg)  ? ?She is non ambulatory due to her knee pain. Says it only hurts occasionally. Very pleasant with dementia. No acute concerns ? ?Has a pacemaker due to syncope and stokes adams, had recent remove transmission.  ?Bps reviewed in matrix.  ?Blood Pressure: 111 / 67 mmHg ? ?Blood Pressure: 130 / 56 mmHg ? ?Blood Pressure: 128 / 58 mmHg ? ?Blood Pressure: 124 / 58 mmHg ?Past Medical History:  ?Diagnosis Date  ? Breast cancer ([place])   ? Colon polyps   ? Coronary artery disease 06/24/2006  ? STEMI inferior with BMS to mid RCA with mid basal inferior wall HK and luminal irrgularities elsewhere  ? Fracture of femoral neck, right ([place]) 06/13/2011  ? Fracture of humeral head, right, closed 06/13/2011  ? GERD (gastroesophageal reflux disease)   ? Hemorrhoids   ? Hyperlipemia   ? Hypertension   ? LBBB (left bundle branch block)   ? Macular degeneration   ? Myocardial infarction [facility])   ? Obstructive  sleep apnea on CPAP   ? Osteopenia   ? Osteopenia   ? Pacemaker 02/25/2014  ? Rhinitis   ? Rotator cuff syndrome   ? SAH (subarachnoid hemorrhage) ([place]) 02/22/2014  ? Stokes Adams attack 02/22/2014  ? Ulnar neuropathy   ? Umbilical hernia   ? Unstable gait   ? Wears hearing aid   ? Per [facility] records  ? ?Past Surgical History:  ?Procedure Laterality Date  ? BREAST LUMPECTOMY    ? CATARACT EXTRACTION, BILATERAL    ? Per [facility]  ? CORONARY ANGIOPLASTY WITH STENT PLACEMENT  06/2006  ? Mid RCA BMS  ? HIP ARTHROPLASTY  06/06/2011  ? Procedure: ARTHROPLASTY BIPOLAR HIP;  Surgeon: Gearlean Alf;  Location: [facility]S;  Service: Orthopedics;  Laterality: Right;  ? PERMANENT PACEMAKER INSERTION N/A 02/25/2014  ? Procedure: PERMANENT PACEMAKER INSERTION;  Surgeon: Evans Lance, MD;  [facility] CATH LAB;  Service: Cardiovascular;  Laterality: N/A;  ? SHOULDER SURGERY    ? TONSILLECTOMY    ? Per [facility] Records   ? WRIST SURGERY    ? ? ?Allergies  ?Allergen Reactions  ? Chlorpheniramine Anaphylaxis  ? Codeine Anaphylaxis  ? Nystatin Rash  ?  redness  ? Amlodipine   ?  Per [facility] Records   ? ? ?Outpatient Encounter Medications as of 08/27/2021  ?Medication Sig  ? acetaminophen (TYLENOL) 500 MG tablet Take 500 mg by mouth every 8 (eight) hours as needed.   ? aspirin EC 81 MG tablet Take 81 mg by mouth daily.  ? Carboxymethylcellulose Sodium (ARTIFICIAL TEARS OP) Apply 1 drop to eye as needed.  ? Chlorhexidine Gluconate 4 % SOLN Apply 1 application topically daily. On Tuesday or Friday  ? cholecalciferol (VITAMIN D) 25 MCG (1000 UNIT) tablet Take 3,000 Units by mouth daily. In the morning between 8-11 am  ? donepezil (ARICEPT) 10 MG tablet Take 1 tablet (10 mg total) by mouth at bedtime.  ? DULoxetine (CYMBALTA) 60 MG capsule Take 60 mg by mouth daily.  ? furosemide (LASIX) 40 MG tablet Take 40 mg by mouth daily. And prn edema  ? levothyroxine (SYNTHROID, LEVOTHROID) 25 MCG tablet Take 25 mcg by mouth daily before breakfast.   ? LORazepam (ATIVAN) 0.5 MG tablet Take 0.5 tablets (0.25 mg total) by mouth at bedtime.  ? losartan (COZAAR) 100 MG tablet Take 100 mg by mouth daily.  ? miconazole (MICOTIN) 2 % cream Apply 1 application topically as needed (Apply to rash under breasts and abdominal folds as needed).  ? mineral oil-hydrophilic petrolatum (AQUAPHOR) ointment Apply topically as needed for dry skin.  ? Multiple  Vitamins-Minerals (PRESERVISION AREDS 2+MULTI VIT PO) Take by mouth 2 (two) times daily.  ? nebivolol (BYSTOLIC) 10 MG tablet Take 1 tablet (10 mg total) by mouth daily.  ? nitroGLYCERIN (NITROSTAT) 0.4 MG SL tablet Place 0.4 mg under the tongue every 5 (five) minutes as needed.  ? potassium chloride SA (K-DUR,KLOR-CON) 20 MEQ tablet Take 20 mEq by mouth daily.   ? sodium fluoride (PREVIDENT 5000 PLUS) 1.1 % CREA dental cream Place 1 application onto teeth every evening.  ? UNABLE TO FIND CPAP Machine  ? vitamin B-12 (CYANOCOBALAMIN) 1000 MCG tablet Take 1,000 mcg by mouth daily.  ? ?No facility-administered encounter medications on file as of 08/27/2021.  ? ? ?Review of Systems  ?Constitutional:  Negative for activity change, appetite change, chills, diaphoresis, fatigue, fever and unexpected weight change.  ?HENT:  Negative for congestion.   ?  Respiratory:  Negative for cough, shortness of breath and wheezing.   ?Cardiovascular:  Positive for leg swelling. Negative for chest pain and palpitations.  ?Gastrointestinal:  Negative for abdominal distention, abdominal pain, constipation and diarrhea.  ?Genitourinary:  Negative for difficulty urinating and dysuria.  ?Musculoskeletal:  Positive for arthralgias and gait problem. Negative for back pain, joint swelling and myalgias.  ?Neurological:  Positive for tremors. Negative for dizziness, seizures, syncope, facial asymmetry, speech difficulty, weakness, light-headedness, numbness and headaches.  ?Psychiatric/Behavioral:  Positive for confusion. Negative for agitation and behavioral problems.   ? ?Immunization History  ?Administered Date(s) Administered  ? Influenza, High Dose Seasonal PF 02/18/2017, 03/22/2019, 03/14/2020  ? Influenza,inj,Quad PF,6+ Mos 03/24/2018  ? Influenza-Unspecified 03/04/2016, 03/18/2020, 02/25/2021  ? Moderna SARS-COV2 Booster Vaccination 04/03/2020, 01/13/2021  ? Moderna Sars-Covid-2 Vaccination 06/04/2019, 07/03/2019, 03/04/2021  ? Pneumococcal  Conjugate-13 05/08/2014  ? Pneumococcal Polysaccharide-23 10/08/2017  ? Td 07/09/2002, 01/16/2013  ? Zoster Recombinat (Shingrix) 11/18/2017, 01/17/2018  ? Zoster, Live 06/30/2010  ? ?Pertinent  Health

## 2021-08-27 NOTE — Assessment & Plan Note (Signed)
Not able to adapt to diet changes due to dementia ?

## 2021-08-27 NOTE — Assessment & Plan Note (Signed)
Continues on cpap qhs ?

## 2021-08-27 NOTE — Assessment & Plan Note (Signed)
MMSE 28/30 ?Remains pleasant and doing well in skilled care ?Continue aricept ?

## 2021-08-27 NOTE — Assessment & Plan Note (Signed)
S/p pacemaker

## 2021-08-27 NOTE — Assessment & Plan Note (Signed)
Lab Results  ?Component Value Date  ? TSH 3.96 09/29/2020  ? ?Continue synthroid ?Check TSH next month ?

## 2021-08-27 NOTE — Assessment & Plan Note (Signed)
Controlled.  

## 2021-08-27 NOTE — Assessment & Plan Note (Signed)
Continue compression hose and lasix.  ? ?

## 2021-08-27 NOTE — Assessment & Plan Note (Signed)
Compensated ?Continue current dose of lasix.  ?Followed by cardiology ?

## 2021-09-18 DIAGNOSIS — Z23 Encounter for immunization: Secondary | ICD-10-CM | POA: Diagnosis not present

## 2021-09-21 ENCOUNTER — Encounter: Payer: Self-pay | Admitting: Internal Medicine

## 2021-09-21 ENCOUNTER — Non-Acute Institutional Stay (SKILLED_NURSING_FACILITY): Payer: Medicare Other | Admitting: Internal Medicine

## 2021-09-21 DIAGNOSIS — E039 Hypothyroidism, unspecified: Secondary | ICD-10-CM | POA: Diagnosis not present

## 2021-09-21 DIAGNOSIS — I5032 Chronic diastolic (congestive) heart failure: Secondary | ICD-10-CM

## 2021-09-21 DIAGNOSIS — I10 Essential (primary) hypertension: Secondary | ICD-10-CM

## 2021-09-21 DIAGNOSIS — F0150 Vascular dementia without behavioral disturbance: Secondary | ICD-10-CM | POA: Diagnosis not present

## 2021-09-21 DIAGNOSIS — I872 Venous insufficiency (chronic) (peripheral): Secondary | ICD-10-CM | POA: Diagnosis not present

## 2021-09-21 DIAGNOSIS — Z9989 Dependence on other enabling machines and devices: Secondary | ICD-10-CM

## 2021-09-21 DIAGNOSIS — G4733 Obstructive sleep apnea (adult) (pediatric): Secondary | ICD-10-CM | POA: Diagnosis not present

## 2021-09-21 DIAGNOSIS — F3341 Major depressive disorder, recurrent, in partial remission: Secondary | ICD-10-CM

## 2021-09-21 DIAGNOSIS — E538 Deficiency of other specified B group vitamins: Secondary | ICD-10-CM | POA: Diagnosis not present

## 2021-09-21 NOTE — Progress Notes (Signed)
?Location:   [place] ?Nursing Home Room Number: 841 ?Place of Service:  SNF (31) ?Provider:  Veleta Miners MD  ? ?Virgie Dad, MD ? ?Patient Care Team: ?Virgie Dad, MD as PCP - General (Internal Medicine) ?Sueanne Margarita, MD as PCP - Sleep Medicine (Sleep Medicine) ?Evans Lance, MD as PCP - Cardiology (Cardiology) ?Justice Britain, MD as Consulting Physician (Orthopedic Surgery) ?Gaynelle Arabian, MD as Consulting Physician (Orthopedic Surgery) ?Garvin Fila, MD as Consulting Physician (Neurology) ?Evans Lance, MD as Consulting Physician (Cardiology) ?Jettie Booze, MD as Consulting Physician (Interventional Cardiology) ?Shon Hough, MD as Consulting Physician (Ophthalmology) ?Community, [facility] ([place]) ?Royal Hawthorn, NP as Nurse Practitioner (Nurse Practitioner) ? ?Extended Emergency Contact Information ?Primary Emergency Contact: Gibas,JED ?Address: [address] ?         [place], [place] ?Home Phone: [phone] ?Mobile Phone: [phone] ?Relation: Son ?Secondary Emergency Contact: MCGARRY,LEE ?Mobile Phone: [phone] ?Relation: Daughter ? ?Code Status:  DNR ?Goals of care: Advanced Directive information ? ?  09/21/2021  ? 10:45 AM  ?Advanced Directives  ?Does Patient Have a Medical Advance Directive? Yes  ?Type of Advance Directive Out of facility DNR (pink MOST or yellow form)  ?Does patient want to make changes to medical advance directive? No - Patient declined  ?Pre-existing out of facility DNR order (yellow form or pink MOST form) Yellow form placed in chart (order not valid for inpatient use)  ? ? ? ?Chief Complaint  ?Patient presents with  ? Medical Management of Chronic Issues  ? ? ?HPI:  ?Pt is a 88 y.o. female seen today for medical management of chronic diseases.   ? ?Patient has h/o Chronic Diastolic CHF, Urinary Incontinence, HTN, Osteoporosis, HLD, B 12 Def, anemia, H/o   Breast cancer and Vascular Dementia  ?h/o SAH and OSA ?Has h/o Breast lump No Work up per her request ? ? She is stable. No new Nursing issues. No Behavior issues ?Her weight is stable ?Mostly Stays in her Wheelchair ?No Falls ?Wt Readings from Last 3 Encounters:  ?09/21/21 252 lb (114.3 kg)  ?08/27/21 252 lb (114.3 kg)  ?08/18/21 249 lb 6.4 oz (113.1 kg)  ? ?Past Medical History:  ?Diagnosis Date  ? Breast cancer ([place])   ? Colon polyps   ? Coronary artery disease 06/24/2006  ? STEMI inferior with BMS to mid RCA with mid basal inferior wall HK and luminal irrgularities elsewhere  ? Fracture of femoral neck, right ([place]) 06/13/2011  ? Fracture of humeral head, right, closed 06/13/2011  ? GERD (gastroesophageal reflux disease)   ? Hemorrhoids   ? Hyperlipemia   ? Hypertension   ? LBBB (left bundle branch block)   ? Macular degeneration   ? Myocardial infarction [facility])   ? Obstructive sleep apnea on CPAP   ? Osteopenia   ? Osteopenia   ? Pacemaker 02/25/2014  ? Rhinitis   ? Rotator cuff syndrome   ? SAH (subarachnoid hemorrhage) ([place]) 02/22/2014  ? Stokes Adams attack 02/22/2014  ? Ulnar neuropathy   ? Umbilical hernia   ? Unstable gait   ? Wears hearing aid   ? Per [facility] records  ? ?Past Surgical History:  ?Procedure Laterality Date  ? BREAST LUMPECTOMY    ? CATARACT EXTRACTION, BILATERAL    ? Per [facility]  ? CORONARY ANGIOPLASTY WITH STENT PLACEMENT  06/2006  ? Mid RCA BMS  ? HIP ARTHROPLASTY  06/06/2011  ? Procedure:  ARTHROPLASTY BIPOLAR HIP;  Surgeon: Gearlean Alf;  Location: [facility] ORS;  Service: Orthopedics;  Laterality: Right;  ? PERMANENT PACEMAKER INSERTION N/A 02/25/2014  ? Procedure: PERMANENT PACEMAKER INSERTION;  Surgeon: Evans Lance, MD;  Location: [facility] CATH LAB;  Service: Cardiovascular;  Laterality: N/A;  ? SHOULDER SURGERY    ? TONSILLECTOMY    ? Per [facility]   ? WRIST SURGERY    ? ? ?Allergies  ?Allergen Reactions  ? Chlorpheniramine Anaphylaxis  ? Codeine Anaphylaxis  ?  Nystatin Rash  ?  redness  ? Amlodipine   ?  Per [facility]   ? ? ?Allergies as of 09/21/2021   ? ?   Reactions  ? Chlorpheniramine Anaphylaxis  ? Codeine Anaphylaxis  ? Nystatin Rash  ? redness  ? Amlodipine   ? Per [facility] Records   ? ?  ? ?  ?Medication List  ?  ? ?  ? Accurate as of Sep 21, 2021 10:49 AM. If you have any questions, ask your nurse or doctor.  ?  ?  ? ?  ? ?acetaminophen 500 MG tablet ?Commonly known as: TYLENOL ?Take 500 mg by mouth every 8 (eight) hours as needed. ?  ?ARTIFICIAL TEARS OP ?Apply 1 drop to eye as needed. ?  ?aspirin EC 81 MG tablet ?Take 81 mg by mouth daily. ?  ?Chlorhexidine Gluconate 4 % Soln ?Apply 1 application topically daily. On Tuesday or Friday ?  ?cholecalciferol 25 MCG (1000 UNIT) tablet ?Commonly known as: VITAMIN D ?Take 3,000 Units by mouth daily. In the morning between 8-11 am ?  ?donepezil 10 MG tablet ?Commonly known as: Aricept ?Take 1 tablet (10 mg total) by mouth at bedtime. ?  ?DULoxetine 60 MG capsule ?Commonly known as: CYMBALTA ?Take 60 mg by mouth daily. ?  ?furosemide 40 MG tablet ?Commonly known as: LASIX ?Take 40 mg by mouth daily. And prn edema ?  ?levothyroxine 25 MCG tablet ?Commonly known as: SYNTHROID ?Take 25 mcg by mouth daily before breakfast. ?  ?LORazepam 0.5 MG tablet ?Commonly known as: ATIVAN ?Take 0.5 tablets (0.25 mg total) by mouth at bedtime. ?  ?losartan 100 MG tablet ?Commonly known as: COZAAR ?Take 100 mg by mouth daily. ?  ?miconazole 2 % cream ?Commonly known as: MICOTIN ?Apply 1 application topically as needed (Apply to rash under breasts and abdominal folds as needed). ?  ?mineral oil-hydrophilic petrolatum ointment ?Apply topically as needed for dry skin. ?  ?nebivolol 10 MG tablet ?Commonly known as: Bystolic ?Take 1 tablet (10 mg total) by mouth daily. ?  ?nitroGLYCERIN 0.4 MG SL tablet ?Commonly known as: NITROSTAT ?Place 0.4 mg under the tongue every 5 (five) minutes as needed. ?  ?potassium chloride SA  20 MEQ tablet ?Commonly known as: KLOR-CON M ?Take 20 mEq by mouth daily. ?  ?PRESERVISION AREDS 2+MULTI VIT PO ?Take by mouth 2 (two) times daily. ?  ?sodium fluoride 1.1 % Crea dental cream ?Commonly known as: PREVIDENT 5000 PLUS ?Place 1 application onto teeth every evening. ?  ?UNABLE TO FIND ?CPAP Machine ?  ?vitamin B-12 1000 MCG tablet ?Commonly known as: CYANOCOBALAMIN ?Take 1,000 mcg by mouth daily. ?  ? ?  ? ? ?Review of Systems  ?Constitutional:  Negative for activity change and appetite change.  ?HENT: Negative.    ?Respiratory:  Negative for cough and shortness of breath.   ?Cardiovascular:  Positive for leg swelling.  ?Gastrointestinal:  Negative for constipation.  ?Genitourinary: Negative.   ?  Musculoskeletal:  Positive for gait problem. Negative for arthralgias and myalgias.  ?Skin: Negative.   ?Neurological:  Negative for dizziness and weakness.  ?Psychiatric/Behavioral:  Positive for confusion. Negative for dysphoric mood and sleep disturbance.   ? ?Immunization History  ?Administered Date(s) Administered  ? Influenza, High Dose Seasonal PF 02/18/2017, 03/22/2019, 03/14/2020  ? Influenza,inj,Quad PF,6+ Mos 03/24/2018  ? Influenza-Unspecified 03/04/2016, 03/18/2020, 02/25/2021  ? Moderna SARS-COV2 Booster Vaccination 04/03/2020, 01/13/2021  ? Moderna Sars-Covid-2 Vaccination 06/04/2019, 07/03/2019, 03/04/2021  ? Pneumococcal Conjugate-13 05/08/2014  ? Pneumococcal Polysaccharide-23 10/08/2017  ? Td 07/09/2002, 01/16/2013  ? Zoster Recombinat (Shingrix) 11/18/2017, 01/17/2018  ? Zoster, Live 06/30/2010  ? ?Pertinent  Health Maintenance Due  ?Topic Date Due  ? INFLUENZA VACCINE  12/22/2021  ? DEXA SCAN  Completed  ? ? ?  10/18/2018  ?  1:31 PM 12/21/2018  ? 11:33 AM 07/04/2019  ?  9:05 AM 01/15/2021  ?  1:46 PM 08/18/2021  ? 12:45 PM  ?Fall Risk  ?Falls in the past year? 0 0 0  1  ?Was there an injury with Fall? 0 0 0  0  ?Fall Risk Category Calculator 0 0 0  1  ?Fall Risk Category Low Low Low  Low   ?Patient Fall Risk Level Low fall risk  Low fall risk Moderate fall risk Moderate fall risk  ?Patient at Risk for Falls Due to  Impaired mobility  History of fall(s) History of fall(s);Impaired balance/gait;Imp

## 2021-10-05 ENCOUNTER — Other Ambulatory Visit: Payer: Self-pay | Admitting: Adult Health

## 2021-10-05 MED ORDER — LORAZEPAM 0.5 MG PO TABS: 0.2500 mg | ORAL_TABLET | Freq: Every day | ORAL | 2 refills | Status: DC

## 2021-10-19 DIAGNOSIS — I10 Essential (primary) hypertension: Secondary | ICD-10-CM | POA: Diagnosis not present

## 2021-10-19 LAB — BASIC METABOLIC PANEL
BUN: 15 (ref 4–21)
CO2: 29 — AB (ref 13–22)
Chloride: 104 (ref 99–108)
Creatinine: 1 (ref 0.5–1.1)
Glucose: 98
Potassium: 4 mEq/L (ref 3.5–5.1)
Sodium: 142 (ref 137–147)

## 2021-10-19 LAB — TSH: TSH: 4.37 (ref 0.41–5.90)

## 2021-10-19 LAB — HEPATIC FUNCTION PANEL
ALT: 7 U/L (ref 7–35)
AST: 14 (ref 13–35)
Alkaline Phosphatase: 104 (ref 25–125)
Bilirubin, Total: 1

## 2021-10-19 LAB — CBC AND DIFFERENTIAL
HCT: 34 — AB (ref 36–46)
Hemoglobin: 11.2 — AB (ref 12.0–16.0)
Platelets: 223 10*3/uL (ref 150–400)
WBC: 7.3

## 2021-10-19 LAB — CBC: RBC: 3.76 — AB (ref 3.87–5.11)

## 2021-10-19 LAB — COMPREHENSIVE METABOLIC PANEL
Albumin: 3.5 (ref 3.5–5.0)
Calcium: 9 (ref 8.7–10.7)
Globulin: 2.7

## 2021-10-30 ENCOUNTER — Encounter: Payer: Self-pay | Admitting: Adult Health

## 2021-10-30 ENCOUNTER — Non-Acute Institutional Stay (SKILLED_NURSING_FACILITY): Payer: Medicare Other | Admitting: Adult Health

## 2021-10-30 DIAGNOSIS — R059 Cough, unspecified: Secondary | ICD-10-CM | POA: Diagnosis not present

## 2021-10-30 DIAGNOSIS — R5383 Other fatigue: Secondary | ICD-10-CM | POA: Diagnosis not present

## 2021-10-30 DIAGNOSIS — R634 Abnormal weight loss: Secondary | ICD-10-CM

## 2021-10-30 MED ORDER — LORAZEPAM 0.5 MG PO TABS: 0.2500 mg | ORAL_TABLET | Freq: Every evening | ORAL | 2 refills | Status: DC | PRN

## 2021-10-30 NOTE — Progress Notes (Signed)
Location:  Occupational psychologist of Service:  SNF (31) Provider:   Cindi Carbon, [place] [phone]   Virgie Dad, MD  Patient Care Team: Virgie Dad, MD as PCP - General (Internal Medicine) Sueanne Margarita, MD as PCP - Sleep Medicine (Sleep Medicine) Evans Lance, MD as PCP - Cardiology (Cardiology) Justice Britain, MD as Consulting Physician (Orthopedic Surgery) Gaynelle Arabian, MD as Consulting Physician (Orthopedic Surgery) Garvin Fila, MD as Consulting Physician (Neurology) Evans Lance, MD as Consulting Physician (Cardiology) Jettie Booze, MD as Consulting Physician (Interventional Cardiology) Shon Hough, MD as Consulting Physician (Ophthalmology) Community, [facility] ([place]) Royal Hawthorn, NP as Nurse Practitioner (Nurse Practitioner)  Extended Emergency Contact Information Primary Emergency Contact: Betzler,JED Address: [place], [place] Phone: [phone] Mobile Phone: [phone] Relation: Son Secondary Emergency Contact: [place] Mobile Phone: [phone] Relation: Daughter  Code Status:  DNR Goals of care: Advanced Directive information    09/21/2021   10:45 AM  Advanced Directives  Does Patient Have a Medical Advance Directive? Yes  Type of Advance Directive Out of facility DNR (pink MOST or yellow form)  Does patient want to make changes to medical advance directive? No - Patient declined  Pre-existing out of facility DNR order (yellow form or pink MOST form) Yellow form placed in chart (order not valid for inpatient use)     Chief Complaint  Patient presents with   Acute Visit    lethargy    HPI:  Pt is a 88 y.o. female seen today for an acute visit for lethargy. She resides in the skilled care unit due to dementia and mobility issues. Nurse reports that for the past couple months she has  been sleeping more during the day. She has sleep apnea and uses cpap at night but we are not sure if she leaves it on all night (she is a poor historian).   Does have a hx of left breast cancer s/p lumpectomy. Has a noted breast lump in 2021 but her family declined mammogram due to her age and goals of care. She is non ambulatory and spends most of her time in the [facility]. Very pleasant and participates in activities.  The past two weeks the sleeping is worse. She takes ativan at night for sleep and anxiety which we started after the death of her husband. She has not appeared depressed. Had labs done recently which were WNL. She did have a cough this week and a rapid covid was done which was negative.  She is no longer coughing but states she does not feel good. She has had sick contacts on the unit. Vitals are WNL BP 122/80, 117/58, 156/80, HR 60s no fever. No decrease in 02 sats.  She has been losing weight, 16 lbs in the past 2 months.    Past Medical History:  Diagnosis Date   Breast cancer ([place])    Colon polyps    Coronary artery disease 06/24/2006   STEMI inferior with BMS to mid RCA with mid basal inferior wall HK and luminal irrgularities elsewhere   Fracture of femoral neck, right ([place]) 06/13/2011   Fracture of humeral head, right, closed 06/13/2011   GERD (gastroesophageal reflux disease)    Hemorrhoids    Hyperlipemia    Hypertension    LBBB (left bundle branch block)    Macular degeneration  Myocardial infarction ([place])    Obstructive sleep apnea on CPAP    Osteopenia    Osteopenia    Pacemaker 02/25/2014   Rhinitis    Rotator cuff syndrome    SAH (subarachnoid hemorrhage) ([place]) 02/22/2014   Stokes Adams attack 02/22/2014   Ulnar neuropathy    Umbilical hernia    Unstable gait    Wears hearing aid    Per [facility] records   Past Surgical History:  Procedure Laterality Date   BREAST LUMPECTOMY     CATARACT EXTRACTION, BILATERAL     Per [facility]    CORONARY ANGIOPLASTY WITH STENT PLACEMENT  06/2006   Mid RCA BMS   HIP ARTHROPLASTY  06/06/2011   Procedure: ARTHROPLASTY BIPOLAR HIP;  Surgeon: Gearlean Alf;  [facility];  Service: Orthopedics;  Laterality: Right;   PERMANENT PACEMAKER INSERTION N/A 02/25/2014   Procedure: PERMANENT PACEMAKER INSERTION;  Surgeon: Evans Lance, MD;  Location: [facility] CATH LAB;  Service: Cardiovascular;  Laterality: N/A;   SHOULDER SURGERY     TONSILLECTOMY     Per [facility] Records    WRIST SURGERY      Allergies  Allergen Reactions   Chlorpheniramine Anaphylaxis   Codeine Anaphylaxis   Nystatin Rash    redness   Amlodipine     Per [facility] Records     Outpatient Encounter Medications as of 10/30/2021  Medication Sig   acetaminophen (TYLENOL) 500 MG tablet Take 500 mg by mouth every 8 (eight) hours as needed.    aspirin EC 81 MG tablet Take 81 mg by mouth daily.   Carboxymethylcellulose Sodium (ARTIFICIAL TEARS OP) Apply 1 drop to eye as needed.   Chlorhexidine Gluconate 4 % SOLN Apply 1 application topically daily. On Tuesday or Friday   cholecalciferol (VITAMIN D) 25 MCG (1000 UNIT) tablet Take 3,000 Units by mouth daily. In the morning between 8-11 am   donepezil (ARICEPT) 10 MG tablet Take 1 tablet (10 mg total) by mouth at bedtime.   DULoxetine (CYMBALTA) 60 MG capsule Take 60 mg by mouth daily.   furosemide (LASIX) 40 MG tablet Take 40 mg by mouth daily. And prn edema   levothyroxine (SYNTHROID, LEVOTHROID) 25 MCG tablet Take 25 mcg by mouth daily before breakfast.    LORazepam (ATIVAN) 0.5 MG tablet Take 0.5 tablets (0.25 mg total) by mouth at bedtime.   losartan (COZAAR) 100 MG tablet Take 100 mg by mouth daily.   miconazole (MICOTIN) 2 % cream Apply 1 application topically as needed (Apply to rash under breasts and abdominal folds as needed).   mineral oil-hydrophilic petrolatum (AQUAPHOR) ointment Apply topically as needed for dry skin.   Multiple Vitamins-Minerals  (PRESERVISION AREDS 2+MULTI VIT PO) Take by mouth 2 (two) times daily.   nebivolol (BYSTOLIC) 10 MG tablet Take 1 tablet (10 mg total) by mouth daily.   nitroGLYCERIN (NITROSTAT) 0.4 MG SL tablet Place 0.4 mg under the tongue every 5 (five) minutes as needed.   potassium chloride SA (K-DUR,KLOR-CON) 20 MEQ tablet Take 20 mEq by mouth daily.    sodium fluoride (PREVIDENT 5000 PLUS) 1.1 % CREA dental cream Place 1 application onto teeth every evening.   UNABLE TO FIND CPAP Machine   vitamin B-12 (CYANOCOBALAMIN) 1000 MCG tablet Take 1,000 mcg by mouth daily.   No facility-administered encounter medications on file as of 10/30/2021.    Review of Systems  Constitutional:  Positive for activity change, appetite change, fatigue and unexpected weight change. Negative for  chills, diaphoresis and fever.  HENT:  Negative for congestion.   Respiratory:  Positive for cough. Negative for shortness of breath and wheezing.   Cardiovascular:  Positive for leg swelling (chronic). Negative for chest pain and palpitations.  Gastrointestinal:  Negative for abdominal distention, abdominal pain, constipation, diarrhea and nausea.  Genitourinary:  Negative for difficulty urinating, dysuria, flank pain, frequency and hematuria.  Musculoskeletal:  Positive for arthralgias and gait problem. Negative for back pain, joint swelling and myalgias.  Skin:  Negative for color change and rash.  Neurological:  Negative for dizziness, tremors, seizures, syncope, facial asymmetry, speech difficulty, weakness, light-headedness, numbness and headaches.  Psychiatric/Behavioral:  Positive for confusion (chronic memory loss). Negative for agitation and behavioral problems.     Immunization History  Administered Date(s) Administered   Influenza, High Dose Seasonal PF 02/18/2017, 03/22/2019, 03/14/2020   Influenza,inj,Quad PF,6+ Mos 03/24/2018   Influenza-Unspecified 03/04/2016, 03/18/2020, 02/25/2021   Moderna SARS-COV2 Booster  Vaccination 04/03/2020, 01/13/2021   Moderna Sars-Covid-2 Vaccination 06/04/2019, 07/03/2019, 03/04/2021   Pneumococcal Conjugate-13 05/08/2014   Pneumococcal Polysaccharide-23 10/08/2017   Td 07/09/2002, 01/16/2013   Zoster Recombinat (Shingrix) 11/18/2017, 01/17/2018   Zoster, Live 06/30/2010   Pertinent  Health Maintenance Due  Topic Date Due   INFLUENZA VACCINE  12/22/2021   DEXA SCAN  Completed      10/18/2018    1:31 PM 12/21/2018   11:33 AM 07/04/2019    9:05 AM 01/15/2021    1:46 PM 08/18/2021   12:45 PM  Fall Risk  Falls in the past year? 0 0 0  1  Was there an injury with Fall? 0 0 0  0  Fall Risk Category Calculator 0 0 0  1  Fall Risk Category Low Low Low  Low  Patient Fall Risk Level Low fall risk  Low fall risk Moderate fall risk Moderate fall risk  Patient at Risk for Falls Due to  Impaired mobility  History of fall(s) History of fall(s);Impaired balance/gait;Impaired mobility;Orthopedic patient  Patient at Risk for Falls Due to - Comments     chronic knee pain  Fall risk Follow up  Falls evaluation completed;Falls prevention discussed  Falls evaluation completed;Follow up appointment Education provided;Falls prevention discussed   Functional Status Survey:    Vitals:   10/30/21 1053  Weight: 236 lb 11.2 oz (107.4 kg)   Body mass index is 46.23 kg/m. Physical Exam Vitals and nursing note reviewed.  Constitutional:      General: She is not in acute distress.    Appearance: She is not diaphoretic.  HENT:     Head: Normocephalic and atraumatic.  Neck:     Vascular: No JVD.  Cardiovascular:     Rate and Rhythm: Normal rate and regular rhythm.     Heart sounds: No murmur heard. Pulmonary:     Effort: Pulmonary effort is normal. No respiratory distress.     Breath sounds: Normal breath sounds. No wheezing.  Skin:    General: Skin is warm and dry.  Neurological:     Mental Status: She is alert and oriented to person, place, and time.     Labs  reviewed: Recent Labs    05/14/21 0000 10/19/21 0000  NA 139 142  K 4.5 4.0  CL 100 104  CO2 27* 29*  BUN 16 15  CREATININE 1.0 1.0  CALCIUM 9.7 9.0   Recent Labs    05/14/21 0000 10/19/21 0000  AST 18 14  ALT 11 7  ALKPHOS 113 104  ALBUMIN  3.9 3.5   Recent Labs    05/14/21 0000 10/19/21 0000  WBC 8.4 7.3  NEUTROABS 4.70  --   HGB 12.9 11.2*  HCT 39 34*  PLT 244 223   Lab Results  Component Value Date   TSH 4.37 10/19/2021   Lab Results  Component Value Date   HGBA1C 6.1 03/01/2019   Lab Results  Component Value Date   CHOL 200 10/18/2019   HDL 44 10/18/2019   LDLCALC 129 10/18/2019   TRIG 137 10/18/2019    Significant Diagnostic Results in last 30 days:  No results found.  Assessment/Plan 1. Lethargy Going on for a couple of months per nurse, worse in the past two weeks.  Had labs done which were ok ?worsening dementia, issues with sleep apnea (has device but not sure of compliance), underlying pna due to wheezing on exam.  Will check Chest XRAY Will change ativan to prn sleep   2. Weight loss Due to reduced intake sleeping with meals Recent TSH  CBC CMP ok Does have a breast lump we are not working up per family request due to her dementia and age.  Having some wheezing on exam, will check Chest XRAY    Labs/tests ordered:  CXR

## 2021-11-10 ENCOUNTER — Ambulatory Visit (INDEPENDENT_AMBULATORY_CARE_PROVIDER_SITE_OTHER): Payer: Medicare Other

## 2021-11-10 ENCOUNTER — Non-Acute Institutional Stay (SKILLED_NURSING_FACILITY): Payer: Medicare Other | Admitting: Orthopedic Surgery

## 2021-11-10 ENCOUNTER — Encounter: Payer: Self-pay | Admitting: Orthopedic Surgery

## 2021-11-10 DIAGNOSIS — R400 Somnolence: Secondary | ICD-10-CM

## 2021-11-10 DIAGNOSIS — R634 Abnormal weight loss: Secondary | ICD-10-CM | POA: Diagnosis not present

## 2021-11-10 DIAGNOSIS — I10 Essential (primary) hypertension: Secondary | ICD-10-CM | POA: Diagnosis not present

## 2021-11-10 DIAGNOSIS — I4430 Unspecified atrioventricular block: Secondary | ICD-10-CM

## 2021-11-10 DIAGNOSIS — F3341 Major depressive disorder, recurrent, in partial remission: Secondary | ICD-10-CM

## 2021-11-10 DIAGNOSIS — G4733 Obstructive sleep apnea (adult) (pediatric): Secondary | ICD-10-CM | POA: Diagnosis not present

## 2021-11-10 DIAGNOSIS — E538 Deficiency of other specified B group vitamins: Secondary | ICD-10-CM

## 2021-11-10 DIAGNOSIS — Z9989 Dependence on other enabling machines and devices: Secondary | ICD-10-CM | POA: Diagnosis not present

## 2021-11-10 DIAGNOSIS — E039 Hypothyroidism, unspecified: Secondary | ICD-10-CM | POA: Diagnosis not present

## 2021-11-10 DIAGNOSIS — I5032 Chronic diastolic (congestive) heart failure: Secondary | ICD-10-CM | POA: Diagnosis not present

## 2021-11-10 DIAGNOSIS — F0150 Vascular dementia without behavioral disturbance: Secondary | ICD-10-CM

## 2021-11-10 LAB — CUP PACEART REMOTE DEVICE CHECK
Battery Remaining Longevity: 29 mo
Battery Remaining Percentage: 25 %
Battery Voltage: 2.86 V
Brady Statistic AP VP Percent: 66 %
Brady Statistic AP VS Percent: 1 %
Brady Statistic AS VP Percent: 29 %
Brady Statistic AS VS Percent: 2.8 %
Brady Statistic RA Percent Paced: 66 %
Brady Statistic RV Percent Paced: 95 %
Date Time Interrogation Session: 20230620080800
Implantable Lead Implant Date: 20151005
Implantable Lead Implant Date: 20151005
Implantable Lead Location: 753859
Implantable Lead Location: 753860
Implantable Pulse Generator Implant Date: 20151005
Lead Channel Impedance Value: 450 Ohm
Lead Channel Impedance Value: 580 Ohm
Lead Channel Pacing Threshold Amplitude: 0.75 V
Lead Channel Pacing Threshold Amplitude: 1 V
Lead Channel Pacing Threshold Pulse Width: 0.5 ms
Lead Channel Pacing Threshold Pulse Width: 0.5 ms
Lead Channel Sensing Intrinsic Amplitude: 12 mV
Lead Channel Sensing Intrinsic Amplitude: 5 mV
Lead Channel Setting Pacing Amplitude: 1 V
Lead Channel Setting Pacing Amplitude: 2 V
Lead Channel Setting Pacing Pulse Width: 0.5 ms
Lead Channel Setting Sensing Sensitivity: 4 mV
Pulse Gen Model: 2240
Pulse Gen Serial Number: 7666288

## 2021-11-10 NOTE — Progress Notes (Signed)
Location:  [facility] Room Number: 124/A Place of Service:  SNF (31) Provider: Yvonna Alanis, NP   Patient Care Team: Virgie Dad, MD as PCP - General (Internal Medicine) Sueanne Margarita, MD as PCP - Sleep Medicine (Sleep Medicine) Evans Lance, MD as PCP - Cardiology (Cardiology) Justice Britain, MD as Consulting Physician (Orthopedic Surgery) Gaynelle Arabian, MD as Consulting Physician (Orthopedic Surgery) Garvin Fila, MD as Consulting Physician (Neurology) Evans Lance, MD as Consulting Physician (Cardiology) Jettie Booze, MD as Consulting Physician (Interventional Cardiology) Shon Hough, MD as Consulting Physician (Ophthalmology) Community, [facility] ([place]) Royal Hawthorn, NP as Nurse Practitioner (Nurse Practitioner)  Extended Emergency Contact Information Primary Emergency Contact: Bellot,JED Address: [place], [place] Phone: [phone] Mobile Phone: [phone] Relation: Son Secondary Emergency Contact: [place] Mobile Phone: [phone] Relation: Daughter  Code Status:  DNR Goals of care: Advanced Directive information    11/10/2021   12:37 PM  Advanced Directives  Does Patient Have a Medical Advance Directive? Yes  Type of Advance Directive Out of facility DNR (pink MOST or yellow form)  Does patient want to make changes to medical advance directive? No - Patient declined  Pre-existing out of facility DNR order (yellow form or pink MOST form) Yellow form placed in chart (order not valid for inpatient use)     Chief Complaint  Patient presents with   Medical Management of Chronic Issues    Routine visit.    Quality Metric Gaps    Discuss the need for additional Covid booster, or post pone if patient refuses.     HPI:  Pt is a 88 y.o. female seen today for medical management of chronic diseases.    She  continues to reside on the skilled nursing unit at [facility] PMH: aortic insufficiency, diastolic heart failure, CAD, HTN, MI, OSA on CPAP, GERD, hypothyroidism, obesity, h/o breast cancer and unstable gait.   Sleepiness/fatigue- TSH 4.37, WBC 7.3, Hgb 11.2 (was 12.9) 10/19/2021, 06/01 CXR unremarkable, wearing CPAP qhs, easily aroused but still sleeping more often, she has not taken ativan for > 3 nights Vascular dementia- MMSE 28/30, CT head 2019 noted moderate atrophy with moderate small vessel ischemic changes of white matter, no recent behavioral outbursts, remains on donepezil and lorazepam prn HTN- BUN/creat 15/1.0 10/19/2021, remains on losartan and bystolic Diastolic heart failure- no weight fluctuations, sob, mild ankle edema, wears ted hose daily, remains on furosemide and potassium daily, regular diet OSA- CPAP qhs Depression- no changes in mood, continues to eat outside of room and attend social activities offered, remains on Cymbalta Hypothyroidism- TSH 4.37 10/19/2021, remains on levothyroxine B12 deficiency- hgb 11.2 10/19/2021, remains on cyanocobalamin daily Weight loss- see weight trends below, noted to be sleeping more  No recent falls or injuries. Ambulates with wheelchair.   Recent blood pressures:  06/13- 121/69  06/06- 122/80  06/05- 117/58  Recent weights:  06/01- 235.8 lbs  05/21- 248.8 lbs  04/05- 252 lbs   Past Medical History:  Diagnosis Date   Breast cancer ([place])    Colon polyps    Coronary artery disease 06/24/2006   STEMI inferior with BMS to mid RCA with mid basal inferior wall HK and luminal irrgularities elsewhere   Fracture of femoral neck, right ([place]) 06/13/2011   Fracture of humeral head, right, closed 06/13/2011   GERD (gastroesophageal reflux disease)  Hemorrhoids    Hyperlipemia    Hypertension    LBBB (left bundle branch block)    Macular degeneration    Myocardial infarction ([place])    Obstructive sleep apnea on CPAP    Osteopenia     Osteopenia    Pacemaker 02/25/2014   Rhinitis    Rotator cuff syndrome    SAH (subarachnoid hemorrhage) ([place]) 02/22/2014   Stokes Adams attack 02/22/2014   Ulnar neuropathy    Umbilical hernia    Unstable gait    Wears hearing aid    Per [facility] records   Past Surgical History:  Procedure Laterality Date   BREAST LUMPECTOMY     CATARACT EXTRACTION, BILATERAL     Per [facility]   CORONARY ANGIOPLASTY WITH STENT PLACEMENT  06/2006   Mid RCA BMS   HIP ARTHROPLASTY  06/06/2011   Procedure: ARTHROPLASTY BIPOLAR HIP;  Surgeon: Gearlean Alf;  Location: [facility] ORS;  Service: Orthopedics;  Laterality: Right;   PERMANENT PACEMAKER INSERTION N/A 02/25/2014   Procedure: PERMANENT PACEMAKER INSERTION;  Surgeon: Evans Lance, MD;  Location: [facility] CATH LAB;  Service: Cardiovascular;  Laterality: N/A;   SHOULDER SURGERY     TONSILLECTOMY     Per [facility]    WRIST SURGERY      Allergies  Allergen Reactions   Chlorpheniramine Anaphylaxis   Codeine Anaphylaxis   Nystatin Rash    redness   Amlodipine     Per [facility]     Outpatient Encounter Medications as of 11/10/2021  Medication Sig   acetaminophen (TYLENOL) 500 MG tablet Take 500 mg by mouth every 8 (eight) hours as needed.    aspirin EC 81 MG tablet Take 81 mg by mouth daily.   Carboxymethylcellulose Sodium (ARTIFICIAL TEARS OP) Apply 1 drop to eye as needed.   Chlorhexidine Gluconate 4 % SOLN Apply 1 application topically daily. On Tuesday or Friday   cholecalciferol (VITAMIN D) 25 MCG (1000 UNIT) tablet Take 3,000 Units by mouth daily. In the morning between 8-11 am   donepezil (ARICEPT) 10 MG tablet Take 1 tablet (10 mg total) by mouth at bedtime.   DULoxetine (CYMBALTA) 60 MG capsule Take 60 mg by mouth daily.   furosemide (LASIX) 40 MG tablet Take 40 mg by mouth daily. And prn edema   levothyroxine (SYNTHROID, LEVOTHROID) 25 MCG tablet Take 25 mcg by mouth daily before breakfast.     LORazepam (ATIVAN) 0.5 MG tablet Take 0.5 tablets (0.25 mg total) by mouth at bedtime as needed for anxiety.   losartan (COZAAR) 100 MG tablet Take 100 mg by mouth daily.   miconazole (MICOTIN) 2 % cream Apply 1 application topically as needed (Apply to rash under breasts and abdominal folds as needed).   mineral oil-hydrophilic petrolatum (AQUAPHOR) ointment Apply topically as needed for dry skin.   Multiple Vitamins-Minerals (PRESERVISION AREDS 2+MULTI VIT PO) Take by mouth 2 (two) times daily.   nebivolol (BYSTOLIC) 10 MG tablet Take 1 tablet (10 mg total) by mouth daily.   nitroGLYCERIN (NITROSTAT) 0.4 MG SL tablet Place 0.4 mg under the tongue every 5 (five) minutes as needed.   potassium chloride SA (K-DUR,KLOR-CON) 20 MEQ tablet Take 20 mEq by mouth daily.    sodium fluoride (PREVIDENT 5000 PLUS) 1.1 % CREA dental cream Place 1 application onto teeth every evening.   UNABLE TO FIND CPAP Machine   vitamin B-12 (CYANOCOBALAMIN) 1000 MCG tablet Take 1,000 mcg by mouth daily.   No facility-administered  encounter medications on file as of 11/10/2021.    Review of Systems  Constitutional:  Positive for fatigue. Negative for activity change, appetite change, chills and fever.  HENT:  Negative for congestion and trouble swallowing.   Eyes:  Negative for visual disturbance.  Respiratory:  Negative for cough and shortness of breath.   Cardiovascular:  Positive for leg swelling. Negative for chest pain.  Gastrointestinal:  Negative for abdominal distention, abdominal pain, constipation, diarrhea, nausea and vomiting.  Genitourinary:  Negative for dysuria, frequency, hematuria and vaginal bleeding.  Musculoskeletal:  Positive for arthralgias and gait problem.  Skin:  Negative for wound.  Neurological:  Positive for weakness. Negative for dizziness and headaches.  Psychiatric/Behavioral:  Positive for confusion and dysphoric mood. Negative for sleep disturbance. The patient is not nervous/anxious.      Immunization History  Administered Date(s) Administered   Influenza, High Dose Seasonal PF 02/18/2017, 03/22/2019, 03/14/2020   Influenza,inj,Quad PF,6+ Mos 03/24/2018   Influenza-Unspecified 03/04/2016, 03/18/2020, 02/25/2021   Moderna SARS-COV2 Booster Vaccination 04/03/2020, 01/13/2021   Moderna Sars-Covid-2 Vaccination 06/04/2019, 07/03/2019, 03/04/2021   Pneumococcal Conjugate-13 05/08/2014   Pneumococcal Polysaccharide-23 10/08/2017   Td 07/09/2002, 01/16/2013   Zoster Recombinat (Shingrix) 11/18/2017, 01/17/2018   Zoster, Live 06/30/2010   Pertinent  Health Maintenance Due  Topic Date Due   INFLUENZA VACCINE  12/22/2021   DEXA SCAN  Completed      10/18/2018    1:31 PM 12/21/2018   11:33 AM 07/04/2019    9:05 AM 01/15/2021    1:46 PM 08/18/2021   12:45 PM  Fall Risk  Falls in the past year? 0 0 0  1  Was there an injury with Fall? 0 0 0  0  Fall Risk Category Calculator 0 0 0  1  Fall Risk Category Low Low Low  Low  Patient Fall Risk Level Low fall risk  Low fall risk Moderate fall risk Moderate fall risk  Patient at Risk for Falls Due to  Impaired mobility  History of fall(s) History of fall(s);Impaired balance/gait;Impaired mobility;Orthopedic patient  Patient at Risk for Falls Due to - Comments     chronic knee pain  Fall risk Follow up  Falls evaluation completed;Falls prevention discussed  Falls evaluation completed;Follow up appointment Education provided;Falls prevention discussed   Functional Status Survey:    Vitals:   11/10/21 1232  BP: 121/69  Pulse: 63  Resp: 15  Temp: (!) 96.8 F (36 C)  SpO2: 98%  Weight: 235 lb 12.8 oz (107 kg)  Height: 5' (1.524 m)   Body mass index is 46.05 kg/m. Physical Exam Vitals reviewed.  Constitutional:      General: She is not in acute distress.    Comments: Pale complexion  HENT:     Head: Normocephalic.     Right Ear: There is no impacted cerumen.     Left Ear: There is no impacted cerumen.     Nose: Nose  normal.     Mouth/Throat:     Mouth: Mucous membranes are moist.  Eyes:     General:        Right eye: No discharge.        Left eye: No discharge.  Cardiovascular:     Rate and Rhythm: Normal rate and regular rhythm.     Pulses: Normal pulses.     Heart sounds: Normal heart sounds.  Pulmonary:     Effort: Pulmonary effort is normal. No respiratory distress.     Breath sounds: Normal breath  sounds. No wheezing.  Abdominal:     General: Bowel sounds are normal. There is no distension.     Palpations: Abdomen is soft.     Tenderness: There is no abdominal tenderness.  Musculoskeletal:     Cervical back: Neck supple.     Right lower leg: No edema.     Left lower leg: No edema.  Skin:    General: Skin is warm and dry.     Capillary Refill: Capillary refill takes less than 2 seconds.  Neurological:     General: No focal deficit present.     Mental Status: She is alert. Mental status is at baseline.     Motor: Weakness present.     Gait: Gait abnormal.     Comments: wheelchair  Psychiatric:        Mood and Affect: Mood normal.        Behavior: Behavior normal.        Cognition and Memory: Cognition is impaired. Memory is impaired.     Comments: Very pleasant, follows commands, alert to self/person/place     Labs reviewed: Recent Labs    05/14/21 0000 10/19/21 0000  NA 139 142  K 4.5 4.0  CL 100 104  CO2 27* 29*  BUN 16 15  CREATININE 1.0 1.0  CALCIUM 9.7 9.0   Recent Labs    05/14/21 0000 10/19/21 0000  AST 18 14  ALT 11 7  ALKPHOS 113 104  ALBUMIN 3.9 3.5   Recent Labs    05/14/21 0000 10/19/21 0000  WBC 8.4 7.3  NEUTROABS 4.70  --   HGB 12.9 11.2*  HCT 39 34*  PLT 244 223   Lab Results  Component Value Date   TSH 4.37 10/19/2021   Lab Results  Component Value Date   HGBA1C 6.1 03/01/2019   Lab Results  Component Value Date   CHOL 200 10/18/2019   HDL 44 10/18/2019   LDLCALC 129 10/18/2019   TRIG 137 10/18/2019    Significant  Diagnostic Results in last 30 days:  No results found.  Assessment/Plan 1. Sleepiness - ongoing - TSH 4.37, WBC 7.3, Hgb 11.2 (was 12.9) 10/19/2021 - CXR unremarkable - wears CPAP qhs - pale complexion- ? Blood loss/ drop in hgb - repeat cbc/diff  2. Vascular dementia without behavioral disturbance (HCC) - no behavioral outbursts - cont skilled nursing - cont Aricept  3. Primary hypertension - controlled  - cont losartan and Bystolic  4. Chronic diastolic heart failure (HCC) - compensated - cont ted hose and lasix  5. Obstructive sleep apnea on CPAP - last sleep study 2014 - cont CPAP qhs  - see above  6. Depression, major, recurrent, in partial remission (HCC) - no mood changes  7. Acquired hypothyroidism - TSH stable - cont levothyroxine  8. B12 deficiency - hgb stable - cont B12  9. Weight loss - stable - suspect due to increased sleepiness - cont monthly weights   Family/ staff Communication: plan discussed with patient and nurse  Labs/tests ordered:  repeat cbc/diff 11/12/2021

## 2021-11-12 DIAGNOSIS — I10 Essential (primary) hypertension: Secondary | ICD-10-CM | POA: Diagnosis not present

## 2021-11-12 LAB — CBC AND DIFFERENTIAL
HCT: 38 (ref 36–46)
Hemoglobin: 12.1 (ref 12.0–16.0)
Platelets: 213 10*3/uL (ref 150–400)
WBC: 8.1

## 2021-11-12 LAB — CBC: RBC: 4.14 (ref 3.87–5.11)

## 2021-11-25 NOTE — Progress Notes (Signed)
Remote pacemaker transmission.   

## 2021-12-08 ENCOUNTER — Encounter: Payer: Self-pay | Admitting: Orthopedic Surgery

## 2021-12-08 ENCOUNTER — Non-Acute Institutional Stay (SKILLED_NURSING_FACILITY): Payer: Medicare Other | Admitting: Orthopedic Surgery

## 2021-12-08 DIAGNOSIS — Z9989 Dependence on other enabling machines and devices: Secondary | ICD-10-CM | POA: Diagnosis not present

## 2021-12-08 DIAGNOSIS — F3341 Major depressive disorder, recurrent, in partial remission: Secondary | ICD-10-CM | POA: Diagnosis not present

## 2021-12-08 DIAGNOSIS — R400 Somnolence: Secondary | ICD-10-CM | POA: Diagnosis not present

## 2021-12-08 DIAGNOSIS — I5032 Chronic diastolic (congestive) heart failure: Secondary | ICD-10-CM | POA: Diagnosis not present

## 2021-12-08 DIAGNOSIS — R634 Abnormal weight loss: Secondary | ICD-10-CM

## 2021-12-08 DIAGNOSIS — I10 Essential (primary) hypertension: Secondary | ICD-10-CM

## 2021-12-08 DIAGNOSIS — G4733 Obstructive sleep apnea (adult) (pediatric): Secondary | ICD-10-CM | POA: Diagnosis not present

## 2021-12-08 DIAGNOSIS — F0150 Vascular dementia without behavioral disturbance: Secondary | ICD-10-CM

## 2021-12-08 DIAGNOSIS — E538 Deficiency of other specified B group vitamins: Secondary | ICD-10-CM | POA: Diagnosis not present

## 2021-12-08 DIAGNOSIS — E039 Hypothyroidism, unspecified: Secondary | ICD-10-CM | POA: Diagnosis not present

## 2021-12-08 NOTE — Progress Notes (Signed)
Location:  [place] Room Number: 124/A Place of Service:  SNF (31) Provider: Yvonna Alanis, NP  Patient Care Team: Virgie Dad, MD as PCP - General (Internal Medicine) Sueanne Margarita, MD as PCP - Sleep Medicine (Sleep Medicine) Evans Lance, MD as PCP - Cardiology (Cardiology) Justice Britain, MD as Consulting Physician (Orthopedic Surgery) Gaynelle Arabian, MD as Consulting Physician (Orthopedic Surgery) Garvin Fila, MD as Consulting Physician (Neurology) Evans Lance, MD as Consulting Physician (Cardiology) Jettie Booze, MD as Consulting Physician (Interventional Cardiology) Shon Hough, MD as Consulting Physician (Ophthalmology) Community, [facility] ([place]) Royal Hawthorn, NP as Nurse Practitioner (Nurse Practitioner)  Extended Emergency Contact Information Primary Emergency Contact: Lan[place],JED Address: [place], [place] of [place] Phone: [phone] Mobile Phone: [phone] Relation: Son Secondary Emergency Contact: [place] Mobile Phone: [phone] Relation: Daughter  Code Status:  DNR Goals of care: Advanced Directive information    12/08/2021   10:43 AM  Advanced Directives  Does Patient Have a Medical Advance Directive? Yes  Type of Advance Directive Out of facility DNR (pink MOST or yellow form)  Does patient want to make changes to medical advance directive? No - Patient declined  Pre-existing out of facility DNR order (yellow form or pink MOST form) Yellow form placed in chart (order not valid for inpatient use)     Chief Complaint  Patient presents with   Medical Management of Chronic Issues    Routine visit.    HPI:  Pt is a 88 y.o. female seen today for medical management of chronic diseases.    She continues to reside on the skilled nursing unit at [facility]. PMH: aortic insufficiency, diastolic heart  failure, CAD, HTN, MI, OSA on CPAP, GERD, hypothyroidism, obesity, h/o breast cancer and unstable gait.   Vascular dementia- MMSE 28/30, CT head 2019 noted moderate atrophy with moderate small vessel ischemic changes of white matter, no recent behavioral outbursts-snaps at staff at times, ambulates in wheelchair, remains on donepezil and lorazepam prn Sleepiness/fatigue- TSH 4.37, WBC 7.3, Hgb 11.2 (was 12.9) 10/19/2021, 06/01 CXR unremarkable, wearing CPAP qhs,still sleeping more during daytime- especially during meals HTN- BUN/creat 15/1.0 10/19/2021, remains on losartan and bystolic Diastolic heart failure- no weight fluctuations, sob, mild ankle edema, wears ted hose daily, remains on furosemide and potassium daily, regular diet OSA- CPAP qhs Depression- no changes in mood, still grieving husband who passed 05/2020- tearful today, remains on Cymbalta Hypothyroidism- TSH 4.37 10/19/2021, remains on levothyroxine B12 deficiency- hgb 11.2 10/19/2021, remains on cyanocobalamin daily Weight loss- see weight trends below, dentures loose- having trouble eating  No recent injuries or falls.   Recent blood pressures:  07/04- 125/64  06/27- 115/70  06/20- 106/40  Recent weights:  07/16- 235.2 lbs  06/18- 235.8 lbs  05/21- 148.8 lbs     Past Medical History:  Diagnosis Date   Breast cancer ([place])    Colon polyps    Coronary artery disease 06/24/2006   STEMI inferior with BMS to mid RCA with mid basal inferior wall HK and luminal irrgularities elsewhere   Fracture of femoral neck, right ([place]) 06/13/2011   Fracture of humeral head, right, closed 06/13/2011   GERD (gastroesophageal reflux disease)    Hemorrhoids    Hyperlipemia    Hypertension    LBBB (left bundle branch block)    Macular degeneration    Myocardial infarction ([place])  Obstructive sleep apnea on CPAP    Osteopenia    Osteopenia    Pacemaker 02/25/2014   Rhinitis    Rotator cuff syndrome    SAH (subarachnoid  hemorrhage) ([place]) 02/22/2014   Stokes Adams attack 02/22/2014   Ulnar neuropathy    Umbilical hernia    Unstable gait    Wears hearing aid    Per [facility] records   Past Surgical History:  Procedure Laterality Date   BREAST LUMPECTOMY     CATARACT EXTRACTION, BILATERAL     Per [facility]   CORONARY ANGIOPLASTY WITH STENT PLACEMENT  06/2006   Mid RCA BMS   HIP ARTHROPLASTY  06/06/2011   Procedure: ARTHROPLASTY BIPOLAR HIP;  Surgeon: Gearlean Alf;  Location: [facility] ORS;  Service: Orthopedics;  Laterality: Right;   PERMANENT PACEMAKER INSERTION N/A 02/25/2014   Procedure: PERMANENT PACEMAKER INSERTION;  Surgeon: Evans Lance, MD;  Location: [facility] CATH LAB;  Service: Cardiovascular;  Laterality: N/A;   SHOULDER SURGERY     TONSILLECTOMY     Per [facility]    WRIST SURGERY      Allergies  Allergen Reactions   Chlorpheniramine Anaphylaxis   Codeine Anaphylaxis   Nystatin Rash    redness   Amlodipine     Per [facility]     Outpatient Encounter Medications as of 12/08/2021  Medication Sig   acetaminophen (TYLENOL) 500 MG tablet Take 500 mg by mouth every 8 (eight) hours as needed.    aspirin EC 81 MG tablet Take 81 mg by mouth daily.   Carboxymethylcellulose Sodium (ARTIFICIAL TEARS OP) Apply 1 drop to eye as needed.   Chlorhexidine Gluconate 4 % SOLN Apply 1 application topically daily. On Tuesday or Friday   cholecalciferol (VITAMIN D) 25 MCG (1000 UNIT) tablet Take 3,000 Units by mouth daily. In the morning between 8-11 am   donepezil (ARICEPT) 10 MG tablet Take 1 tablet (10 mg total) by mouth at bedtime.   DULoxetine (CYMBALTA) 60 MG capsule Take 60 mg by mouth daily.   furosemide (LASIX) 40 MG tablet Take 40 mg by mouth daily. And prn edema   levothyroxine (SYNTHROID, LEVOTHROID) 25 MCG tablet Take 25 mcg by mouth daily before breakfast.    LORazepam (ATIVAN) 0.5 MG tablet Take 0.5 tablets (0.25 mg total) by mouth at bedtime as needed  for anxiety.   losartan (COZAAR) 100 MG tablet Take 100 mg by mouth daily.   miconazole (MICOTIN) 2 % cream Apply 1 application topically as needed (Apply to rash under breasts and abdominal folds as needed).   mineral oil-hydrophilic petrolatum (AQUAPHOR) ointment Apply topically as needed for dry skin.   Multiple Vitamins-Minerals (PRESERVISION AREDS 2+MULTI VIT PO) Take by mouth 2 (two) times daily.   nebivolol (BYSTOLIC) 10 MG tablet Take 1 tablet (10 mg total) by mouth daily.   nitroGLYCERIN (NITROSTAT) 0.4 MG SL tablet Place 0.4 mg under the tongue every 5 (five) minutes as needed.   potassium chloride SA (K-DUR,KLOR-CON) 20 MEQ tablet Take 20 mEq by mouth daily.    sodium fluoride (PREVIDENT 5000 PLUS) 1.1 % CREA dental cream Place 1 application onto teeth every evening.   UNABLE TO FIND CPAP Machine   vitamin B-12 (CYANOCOBALAMIN) 1000 MCG tablet Take 1,000 mcg by mouth daily.   No facility-administered encounter medications on file as of 12/08/2021.    Review of Systems  Unable to perform ROS: Dementia    Immunization History  Administered Date(s) Administered   Influenza,  High Dose Seasonal PF 02/18/2017, 03/22/2019, 03/14/2020   Influenza,inj,Quad PF,6+ Mos 03/24/2018   Influenza-Unspecified 03/04/2016, 03/18/2020, 02/25/2021   Moderna Covid-19 Vaccine Bivalent Booster 10yr & up 09/18/2021   Moderna SARS-COV2 Booster Vaccination 04/03/2020, 01/13/2021   Moderna Sars-Covid-2 Vaccination 06/04/2019, 07/03/2019, 03/04/2021   Pneumococcal Conjugate-13 05/08/2014   Pneumococcal Polysaccharide-23 10/08/2017   Td 07/09/2002, 01/16/2013   Zoster Recombinat (Shingrix) 11/18/2017, 01/17/2018   Zoster, Live 06/30/2010   Pertinent  Health Maintenance Due  Topic Date Due   INFLUENZA VACCINE  12/22/2021   DEXA SCAN  Completed      10/18/2018    1:31 PM 12/21/2018   11:33 AM 07/04/2019    9:05 AM 01/15/2021    1:46 PM 08/18/2021   12:45 PM  Fall Risk  Falls in the past year? 0 0  0  1  Was there an injury with Fall? 0 0 0  0  Fall Risk Category Calculator 0 0 0  1  Fall Risk Category Low Low Low  Low  Patient Fall Risk Level Low fall risk  Low fall risk Moderate fall risk Moderate fall risk  Patient at Risk for Falls Due to  Impaired mobility  History of fall(s) History of fall(s);Impaired balance/gait;Impaired mobility;Orthopedic patient  Patient at Risk for Falls Due to - Comments     chronic knee pain  Fall risk Follow up  Falls evaluation completed;Falls prevention discussed  Falls evaluation completed;Follow up appointment Education provided;Falls prevention discussed   Functional Status Survey:    Vitals:   12/08/21 1034  BP: 125/64  Pulse: 65  Resp: 16  Temp: 97.7 F (36.5 C)  SpO2: 93%  Weight: 235 lb 3.2 oz (106.7 kg)  Height: 5' (1.524 m)   Body mass index is 45.93 kg/m. Physical Exam Vitals reviewed.  Constitutional:      General: She is not in acute distress.    Appearance: She is obese.  HENT:     Head: Normocephalic.     Right Ear: There is no impacted cerumen.     Left Ear: There is no impacted cerumen.     Nose: Nose normal.     Mouth/Throat:     Mouth: Mucous membranes are moist.  Eyes:     General:        Right eye: No discharge.        Left eye: No discharge.  Cardiovascular:     Rate and Rhythm: Normal rate and regular rhythm.     Pulses: Normal pulses.     Heart sounds: Normal heart sounds.  Pulmonary:     Effort: Pulmonary effort is normal. No respiratory distress.     Breath sounds: Normal breath sounds. No wheezing.  Abdominal:     General: Bowel sounds are normal. There is no distension.     Palpations: Abdomen is soft.     Tenderness: There is no abdominal tenderness.  Musculoskeletal:     Cervical back: Neck supple.     Right lower leg: No edema.     Left lower leg: No edema.  Skin:    General: Skin is warm and dry.     Capillary Refill: Capillary refill takes less than 2 seconds.  Neurological:      General: No focal deficit present.     Mental Status: Mental status is at baseline.     Motor: Weakness present.     Gait: Gait abnormal.     Comments: wheelchair  Psychiatric:  Mood and Affect: Mood normal.        Behavior: Behavior normal.     Comments: Very pleasant, follows commands, alert to self/person/place     Labs reviewed: Recent Labs    05/14/21 0000 10/19/21 0000  NA 139 142  K 4.5 4.0  CL 100 104  CO2 27* 29*  BUN 16 15  CREATININE 1.0 1.0  CALCIUM 9.7 9.0   Recent Labs    05/14/21 0000 10/19/21 0000  AST 18 14  ALT 11 7  ALKPHOS 113 104  ALBUMIN 3.9 3.5   Recent Labs    05/14/21 0000 10/19/21 0000 11/12/21 0000  WBC 8.4 7.3 8.1  NEUTROABS 4.70  --   --   HGB 12.9 11.2* 12.1  HCT 39 34* 38  PLT 244 223 213   Lab Results  Component Value Date   TSH 4.37 10/19/2021   Lab Results  Component Value Date   HGBA1C 6.1 03/01/2019   Lab Results  Component Value Date   CHOL 200 10/18/2019   HDL 44 10/18/2019   LDLCALC 129 10/18/2019   TRIG 137 10/18/2019    Significant Diagnostic Results in last 30 days:  CUP PACEART REMOTE DEVICE CHECK  Result Date: 11/10/2021 Scheduled remote reviewed. Normal device function.  AF burden <1%, all < 1 min Next remote 91 days- JJB   Assessment/Plan 1. Vascular dementia without behavioral disturbance (HCC) - no behavioral outbursts - skilled nursing care - cont Aricept   2. Sleepiness - ongoing - sleeping through meals - TSH 4.37, WBC 7.3, Hgb 11.2 (was 12.9) 10/19/2021 - CXR unremarkable - wearing CPAP qhs - ? depression  3. Primary hypertension - controlled  - cont losartan and Bystolic  4. Chronic diastolic heart failure (HCC) - compensated - cont ted hose and furosemide  5. Obstructive sleep apnea on CPAP - cont CPAP qhs  6. Depression, major, recurrent, in partial remission ([place]) - tearful today, discussing husband - increased sleepiness - may consider Wellbutrin  - cont  Cymbalta  7. Acquired hypothyroidism - TSH stable - cont levothyroxine  8. B12 deficiency - cont B12  9. Weight loss - ongoing - down 13 lbs - ? depression - ? loose dentures - schedule with     Family/ staff Communication: plan discussed with patient and nurse  Labs/tests ordered:  none

## 2022-01-16 DIAGNOSIS — Z79899 Other long term (current) drug therapy: Secondary | ICD-10-CM | POA: Diagnosis not present

## 2022-01-16 LAB — BASIC METABOLIC PANEL
BUN: 13 (ref 4–21)
CO2: 27 — AB (ref 13–22)
Chloride: 105 (ref 99–108)
Creatinine: 0.9 (ref 0.5–1.1)
Glucose: 107
Potassium: 4 mEq/L (ref 3.5–5.1)
Sodium: 143 (ref 137–147)

## 2022-01-16 LAB — CBC AND DIFFERENTIAL
HCT: 34 — AB (ref 36–46)
Hemoglobin: 11.7 — AB (ref 12.0–16.0)
Platelets: 230 10*3/uL (ref 150–400)
WBC: 7.6

## 2022-01-16 LAB — COMPREHENSIVE METABOLIC PANEL
Albumin: 3.3 — AB (ref 3.5–5.0)
Calcium: 9.1 (ref 8.7–10.7)
Globulin: 2.9
eGFR: 60

## 2022-01-16 LAB — HEPATIC FUNCTION PANEL: Bilirubin, Total: 0.5

## 2022-01-16 LAB — CBC: RBC: 3.79 — AB (ref 3.87–5.11)

## 2022-01-18 ENCOUNTER — Encounter: Payer: Self-pay | Admitting: Internal Medicine

## 2022-01-19 ENCOUNTER — Non-Acute Institutional Stay (SKILLED_NURSING_FACILITY): Payer: Medicare Other | Admitting: Orthopedic Surgery

## 2022-01-19 ENCOUNTER — Encounter: Payer: Self-pay | Admitting: Orthopedic Surgery

## 2022-01-19 DIAGNOSIS — F0150 Vascular dementia without behavioral disturbance: Secondary | ICD-10-CM

## 2022-01-19 DIAGNOSIS — G4733 Obstructive sleep apnea (adult) (pediatric): Secondary | ICD-10-CM | POA: Diagnosis not present

## 2022-01-19 DIAGNOSIS — E538 Deficiency of other specified B group vitamins: Secondary | ICD-10-CM | POA: Diagnosis not present

## 2022-01-19 DIAGNOSIS — I10 Essential (primary) hypertension: Secondary | ICD-10-CM | POA: Diagnosis not present

## 2022-01-19 DIAGNOSIS — F3341 Major depressive disorder, recurrent, in partial remission: Secondary | ICD-10-CM | POA: Diagnosis not present

## 2022-01-19 DIAGNOSIS — Z9989 Dependence on other enabling machines and devices: Secondary | ICD-10-CM

## 2022-01-19 DIAGNOSIS — I5032 Chronic diastolic (congestive) heart failure: Secondary | ICD-10-CM

## 2022-01-19 DIAGNOSIS — I4430 Unspecified atrioventricular block: Secondary | ICD-10-CM

## 2022-01-19 DIAGNOSIS — E039 Hypothyroidism, unspecified: Secondary | ICD-10-CM

## 2022-01-19 NOTE — Progress Notes (Signed)
Location:   [place] [place] Room Number: 161-W Place of Service:  SNF [phone]) Provider:  Windell Moulding, NP  PCP: Virgie Dad, MD  Patient Care Team: Virgie Dad, MD as PCP - General (Internal Medicine) Sueanne Margarita, MD as PCP - Sleep Medicine (Sleep Medicine) Evans Lance, MD as PCP - Cardiology (Cardiology) Justice Britain, MD as Consulting Physician (Orthopedic Surgery) Gaynelle Arabian, MD as Consulting Physician (Orthopedic Surgery) Garvin Fila, MD as Consulting Physician (Neurology) Evans Lance, MD as Consulting Physician (Cardiology) Jettie Booze, MD as Consulting Physician (Interventional Cardiology) Shon Hough, MD as Consulting Physician (Ophthalmology) Community, [facility] ([place]) Royal Hawthorn, NP as Nurse Practitioner (Nurse Practitioner)  Extended Emergency Contact Information Primary Emergency Contact: [place],JED Address: [place], [place] Phone: [phone] Mobile Phone: [phone] Relation: Son Secondary Emergency Contact: [place] Mobile Phone: [phone] Relation: Daughter  Code Status:  DNR Goals of care: Advanced Directive information    01/19/2022   10:52 AM  Advanced Directives  Does Patient Have a Medical Advance Directive? Yes  Type of Advance Directive Out of facility DNR (pink MOST or yellow form)  Does patient want to make changes to medical advance directive? No - Patient declined     Chief Complaint  Patient presents with   Medical Management of Chronic Issues    Routine Visit.    Immunizations    Discuss the need for Covid Booster, and Infuenza vaccine.     HPI:  Pt is a 88 y.o. female seen today for medical management of chronic diseases.    She continues to reside on the skilled nursing unit at [facility] PMH: aortic insufficiency, diastolic heart failure, CAD, HTN, MI, OSA on  CPAP, GERD, hypothyroidism, obesity, h/o breast cancer and unstable gait.    Vascular dementia- MMSE 28/30, CT head 2019 noted moderate atrophy with moderate small vessel ischemic changes of white matter, no recent behavioral outbursts, sleeping more- workup unremarkable 11/2021, ambulates in wheelchair, remains on donepezil and lorazepam prn HTN- BUN/creat 13/0.9 01/16/2022, remains on losartan and Bystolic AV block- s/p pacemaker- checked 62/95/2841 Diastolic heart failure- no weight fluctuations, sob, mild ankle edema, wears ted hose daily, remains on furosemide and potassium daily, regular diet OSA- CPAP qhs Depression- no changes in mood, see above, grieves husband at times, remains on Cymbalta, Na + 143 01/16/2022 Hypothyroidism- TSH 4.37 10/19/2021, remains on levothyroxine B12 deficiency- hgb 11.7 01/16/2022, remains on cyanocobalamin daily  No recent falls or injuries. Ambulates with wheelchair.   Recent blood pressures:  08/22- 103/62  08/15- 127/60  08/08- 146/80  Recent weights:   08/02- 235 lbs  07/16- 235.2 lbs  06/01- 235.8 lbs       Past Medical History:  Diagnosis Date   Breast cancer ([place])    Colon polyps    Coronary artery disease 06/24/2006   STEMI inferior with BMS to mid RCA with mid basal inferior wall HK and luminal irrgularities elsewhere   Fracture of femoral neck, right ([place]) 06/13/2011   Fracture of humeral head, right, closed 06/13/2011   GERD (gastroesophageal reflux disease)    Hemorrhoids    Hyperlipemia    Hypertension    LBBB (left bundle branch block)    Macular degeneration    Myocardial infarction ([place])    Obstructive sleep apnea on CPAP    Osteopenia    Osteopenia    Pacemaker  02/25/2014   Rhinitis    Rotator cuff syndrome    SAH (subarachnoid hemorrhage) ([place]) 02/22/2014   Stokes Adams attack 02/22/2014   Ulnar neuropathy    Umbilical hernia    Unstable gait    Wears hearing aid    Per [facility] records   Past Surgical  History:  Procedure Laterality Date   BREAST LUMPECTOMY     CATARACT EXTRACTION, BILATERAL     Per [facility]   CORONARY ANGIOPLASTY WITH STENT PLACEMENT  06/2006   Mid RCA BMS   HIP ARTHROPLASTY  06/06/2011   Procedure: ARTHROPLASTY BIPOLAR HIP;  Surgeon: Gearlean Alf;  Location: [facility] ORS;  Service: Orthopedics;  Laterality: Right;   PERMANENT PACEMAKER INSERTION N/A 02/25/2014   Procedure: PERMANENT PACEMAKER INSERTION;  Surgeon: Evans Lance, MD;  Location[facility] C[facility] LAB;  Service: Cardiovascular;  Laterality: N/A;   SHOULDER SURGERY     TONSILLECTOMY     Per [facility]    WRIST SURGERY      Allergies  Allergen Reactions   Chlor-Trimeton [Chlorpheniramine] Anaphylaxis   Codeine Anaphylaxis   Nystatin Rash    redness   Amlodipine     Per [facility]     Allergies as of 01/19/2022       Reactions   Chlor-trimeton [chlorpheniramine] Anaphylaxis   Codeine Anaphylaxis   Nystatin Rash   redness   Amlodipine    Per [facility]         Medication List        Accurate as of January 19, 2022 10:52 AM. If you have any questions, ask your nurse or doctor.          STOP taking these medications    LORazepam 0.5 MG tablet Commonly known as: ATIVAN Stopped by: Yvonna Alanis, NP       TAKE these medications    acetaminophen 500 MG tablet Commonly known as: TYLENOL Take 500 mg by mouth every 8 (eight) hours as needed.   ARTIFICIAL TEARS OP Apply 1 drop to eye as needed.   aspirin EC 81 MG tablet Take 81 mg by mouth daily.   Chlorhexidine Gluconate 4 % Soln Apply 1 application topically daily. On Tuesday or Friday   cholecalciferol 25 MCG (1000 UNIT) tablet Commonly known as: VITAMIN D3 Take 3,000 Units by mouth daily. In the morning between 8-11 am   cyanocobalamin 1000 MCG tablet Commonly known as: VITAMIN B12 Take 1,000 mcg by mouth daily.   donepezil 10 MG tablet Commonly known as: Aricept Take 1  tablet (10 mg total) by mouth at bedtime.   DULoxetine 60 MG capsule Commonly known as: CYMBALTA Take 60 mg by mouth daily.   furosemide 40 MG tablet Commonly known as: LASIX Take 40 mg by mouth daily. And prn edema   furosemide 40 MG tablet Commonly known as: LASIX Take 40 mg by mouth as needed for edema or fluid.   levothyroxine 25 MCG tablet Commonly known as: SYNTHROID Take 25 mcg by mouth daily before breakfast.   losartan 100 MG tablet Commonly known as: COZAAR Take 100 mg by mouth daily.   miconazole 2 % cream Commonly known as: MICOTIN Apply 1 application topically as needed (Apply to rash under breasts and abdominal folds as needed).   mineral oil-hydrophilic petrolatum ointment Apply topically as needed for dry skin.   nebivolol 10 MG tablet Commonly known as: Bystolic Take 1 tablet (10 mg total) by mouth daily.  nitroGLYCERIN 0.4 MG SL tablet Commonly known as: NITROSTAT Place 0.4 mg under the tongue every 5 (five) minutes as needed.   potassium chloride SA 20 MEQ tablet Commonly known as: KLOR-CON M Take 20 mEq by mouth daily.   PRESERVISION AREDS 2+MULTI VIT PO Take by mouth 2 (two) times daily.   sodium fluoride 1.1 % Crea dental cream Commonly known as: PREVIDENT 5000 PLUS Place 1 application  onto teeth at bedtime.   UNABLE TO FIND CPAP Machine        Review of Systems  Unable to perform ROS: Dementia    Immunization History  Administered Date(s) Administered   Influenza, High Dose Seasonal PF 02/18/2017, 03/22/2019, 03/14/2020   Influenza,inj,Quad PF,6+ Mos 03/24/2018   Influenza-Unspecified 03/04/2016, 03/18/2020, 02/25/2021   Moderna Covid-19 Vaccine Bivalent Booster 51yr & up 09/18/2021   Moderna SARS-COV2 Booster Vaccination 04/03/2020, 01/13/2021   Moderna Sars-Covid-2 Vaccination 06/04/2019, 07/03/2019, 03/04/2021   Pneumococcal Conjugate-13 05/08/2014   Pneumococcal Polysaccharide-23 10/08/2017   Td 07/09/2002, 01/16/2013    Zoster Recombinat (Shingrix) 11/18/2017, 01/17/2018   Zoster, Live 06/30/2010   Pertinent  Health Maintenance Due  Topic Date Due   INFLUENZA VACCINE  12/22/2021   DEXA SCAN  Completed      10/18/2018    1:31 PM 12/21/2018   11:33 AM 07/04/2019    9:05 AM 01/15/2021    1:46 PM 08/18/2021   12:45 PM  Fall Risk  Falls in the past year? 0 0 0  1  Was there an injury with Fall? 0 0 0  0  Fall Risk Category Calculator 0 0 0  1  Fall Risk Category Low Low Low  Low  Patient Fall Risk Level Low fall risk  Low fall risk Moderate fall risk Moderate fall risk  Patient at Risk for Falls Due to  Impaired mobility  History of fall(s) History of fall(s);Impaired balance/gait;Impaired mobility;Orthopedic patient  Patient at Risk for Falls Due to - Comments     chronic knee pain  Fall risk Follow up  Falls evaluation completed;Falls prevention discussed  Falls evaluation completed;Follow up appointment Education provided;Falls prevention discussed   Functional Status Survey:    Vitals:   01/19/22 1044  BP: 103/62  Pulse: 67  Resp: 15  Temp: (!) 97.3 F (36.3 C)  SpO2: 97%  Weight: 236 lb 3.2 oz (107.1 kg)  Height: 5' (1.524 m)   Body mass index is 46.13 kg/m. Physical Exam Vitals reviewed.  Constitutional:      General: She is not in acute distress.    Comments: Normal complexion  HENT:     Head: Normocephalic.     Right Ear: There is no impacted cerumen.     Left Ear: There is no impacted cerumen.     Nose: Nose normal.     Mouth/Throat:     Mouth: Mucous membranes are moist.  Eyes:     General:        Right eye: No discharge.        Left eye: No discharge.  Cardiovascular:     Rate and Rhythm: Normal rate and regular rhythm.     Pulses: Normal pulses.     Heart sounds: Normal heart sounds.  Pulmonary:     Effort: Pulmonary effort is normal. No respiratory distress.     Breath sounds: Normal breath sounds. No wheezing or rales.  Abdominal:     General: Bowel sounds are  normal. There is no distension.     Palpations: Abdomen  is soft.     Tenderness: There is no abdominal tenderness.  Musculoskeletal:     Cervical back: Neck supple.     Right lower leg: Edema present.     Left lower leg: Edema present.     Comments: Non pitting  Skin:    General: Skin is warm and dry.     Capillary Refill: Capillary refill takes less than 2 seconds.  Neurological:     General: No focal deficit present.     Mental Status: She is alert. Mental status is at baseline.     Motor: Weakness present.     Gait: Gait abnormal.     Comments: wheelchair  Psychiatric:        Mood and Affect: Mood normal.        Behavior: Behavior normal.        Cognition and Memory: Cognition is impaired. Memory is impaired.     Comments: Very pleasant, follows commands, alert to self and familiar face     Labs reviewed: Recent Labs    05/14/21 0000 10/19/21 0000 01/16/22 0000  NA 139 142 143  K 4.5 4.0 4.0  CL 100 104 105  CO2 27* 29* 27*  BUN '16 15 13  '$ CREATININE 1.0 1.0 0.9  CALCIUM 9.7 9.0 9.1   Recent Labs    05/14/21 0000 10/19/21 0000 01/16/22 0000  AST 18 14  --   ALT 11 7  --   ALKPHOS 113 104  --   ALBUMIN 3.9 3.5 3.3*   Recent Labs    05/14/21 0000 10/19/21 0000 11/12/21 0000 01/16/22 0000  WBC 8.4 7.3 8.1 7.6  NEUTROABS 4.70  --   --   --   HGB 12.9 11.2* 12.1 11.7*  HCT 39 34* 38 34*  PLT 244 223 213 230   Lab Results  Component Value Date   TSH 4.37 10/19/2021   Lab Results  Component Value Date   HGBA1C 6.1 03/01/2019   Lab Results  Component Value Date   CHOL 200 10/18/2019   HDL 44 10/18/2019   LDLCALC 129 10/18/2019   TRIG 137 10/18/2019    Significant Diagnostic Results in last 30 days:  No results found.  Assessment/Plan 1. Vascular dementia without behavioral disturbance ([place]) - no recent behaviors - ambulates with wheelchair, able to feed self - cont skilled nursing - cont Aricept  2. Primary hypertension - controlled -  cont losartan and Bystolic  3. AV block - s/p pacemaker- checked 11/10/2021  4. Chronic diastolic heart failure (HCC) - compensated - mild non pitting edema to BLE - cont ted hose - cont furosemide  5. Obstructive sleep apnea on CPAP - cont CPAP qhs  6. Depression, major, recurrent, in partial remission ([place]) - sometimes tearful - cont Cymbalta  7. Acquired hypothyroidism - TSH stable - cont levothyroxine  8. B12 deficiency - hgb stable - cont B12    Family/ staff Communication: plan discussed with patient and nurse  Labs/tests ordered: none

## 2022-01-26 DIAGNOSIS — R293 Abnormal posture: Secondary | ICD-10-CM | POA: Diagnosis not present

## 2022-01-26 DIAGNOSIS — R278 Other lack of coordination: Secondary | ICD-10-CM | POA: Diagnosis not present

## 2022-01-26 DIAGNOSIS — R4189 Other symptoms and signs involving cognitive functions and awareness: Secondary | ICD-10-CM | POA: Diagnosis not present

## 2022-01-26 DIAGNOSIS — F01B0 Vascular dementia, moderate, without behavioral disturbance, psychotic disturbance, mood disturbance, and anxiety: Secondary | ICD-10-CM | POA: Diagnosis not present

## 2022-01-26 DIAGNOSIS — Z9181 History of falling: Secondary | ICD-10-CM | POA: Diagnosis not present

## 2022-01-29 DIAGNOSIS — F01B0 Vascular dementia, moderate, without behavioral disturbance, psychotic disturbance, mood disturbance, and anxiety: Secondary | ICD-10-CM | POA: Diagnosis not present

## 2022-01-29 DIAGNOSIS — R293 Abnormal posture: Secondary | ICD-10-CM | POA: Diagnosis not present

## 2022-01-29 DIAGNOSIS — R4189 Other symptoms and signs involving cognitive functions and awareness: Secondary | ICD-10-CM | POA: Diagnosis not present

## 2022-01-29 DIAGNOSIS — Z9181 History of falling: Secondary | ICD-10-CM | POA: Diagnosis not present

## 2022-01-29 DIAGNOSIS — R278 Other lack of coordination: Secondary | ICD-10-CM | POA: Diagnosis not present

## 2022-02-02 DIAGNOSIS — F01B0 Vascular dementia, moderate, without behavioral disturbance, psychotic disturbance, mood disturbance, and anxiety: Secondary | ICD-10-CM | POA: Diagnosis not present

## 2022-02-02 DIAGNOSIS — Z9181 History of falling: Secondary | ICD-10-CM | POA: Diagnosis not present

## 2022-02-02 DIAGNOSIS — R4189 Other symptoms and signs involving cognitive functions and awareness: Secondary | ICD-10-CM | POA: Diagnosis not present

## 2022-02-02 DIAGNOSIS — R293 Abnormal posture: Secondary | ICD-10-CM | POA: Diagnosis not present

## 2022-02-02 DIAGNOSIS — R278 Other lack of coordination: Secondary | ICD-10-CM | POA: Diagnosis not present

## 2022-02-04 DIAGNOSIS — R293 Abnormal posture: Secondary | ICD-10-CM | POA: Diagnosis not present

## 2022-02-04 DIAGNOSIS — F01B0 Vascular dementia, moderate, without behavioral disturbance, psychotic disturbance, mood disturbance, and anxiety: Secondary | ICD-10-CM | POA: Diagnosis not present

## 2022-02-04 DIAGNOSIS — R278 Other lack of coordination: Secondary | ICD-10-CM | POA: Diagnosis not present

## 2022-02-04 DIAGNOSIS — Z9181 History of falling: Secondary | ICD-10-CM | POA: Diagnosis not present

## 2022-02-04 DIAGNOSIS — R4189 Other symptoms and signs involving cognitive functions and awareness: Secondary | ICD-10-CM | POA: Diagnosis not present

## 2022-02-09 ENCOUNTER — Ambulatory Visit (INDEPENDENT_AMBULATORY_CARE_PROVIDER_SITE_OTHER): Payer: Medicare Other

## 2022-02-09 DIAGNOSIS — I4430 Unspecified atrioventricular block: Secondary | ICD-10-CM | POA: Diagnosis not present

## 2022-02-09 LAB — CUP PACEART REMOTE DEVICE CHECK
Battery Remaining Longevity: 26 mo
Battery Remaining Percentage: 22 %
Battery Voltage: 2.84 V
Brady Statistic AP VP Percent: 66 %
Brady Statistic AP VS Percent: 1.1 %
Brady Statistic AS VP Percent: 29 %
Brady Statistic AS VS Percent: 3.1 %
Brady Statistic RA Percent Paced: 66 %
Brady Statistic RV Percent Paced: 95 %
Date Time Interrogation Session: 20230919020024
Implantable Lead Implant Date: 20151005
Implantable Lead Implant Date: 20151005
Implantable Lead Location: 753859
Implantable Lead Location: 753860
Implantable Pulse Generator Implant Date: 20151005
Lead Channel Impedance Value: 490 Ohm
Lead Channel Impedance Value: 560 Ohm
Lead Channel Pacing Threshold Amplitude: 0.75 V
Lead Channel Pacing Threshold Amplitude: 1.125 V
Lead Channel Pacing Threshold Pulse Width: 0.5 ms
Lead Channel Pacing Threshold Pulse Width: 0.5 ms
Lead Channel Sensing Intrinsic Amplitude: 12 mV
Lead Channel Sensing Intrinsic Amplitude: 5 mV
Lead Channel Setting Pacing Amplitude: 1 V
Lead Channel Setting Pacing Amplitude: 2.125
Lead Channel Setting Pacing Pulse Width: 0.5 ms
Lead Channel Setting Sensing Sensitivity: 4 mV
Pulse Gen Model: 2240
Pulse Gen Serial Number: 7666288

## 2022-02-10 DIAGNOSIS — L821 Other seborrheic keratosis: Secondary | ICD-10-CM | POA: Diagnosis not present

## 2022-02-10 DIAGNOSIS — L814 Other melanin hyperpigmentation: Secondary | ICD-10-CM | POA: Diagnosis not present

## 2022-02-10 DIAGNOSIS — L820 Inflamed seborrheic keratosis: Secondary | ICD-10-CM | POA: Diagnosis not present

## 2022-02-11 DIAGNOSIS — F01B0 Vascular dementia, moderate, without behavioral disturbance, psychotic disturbance, mood disturbance, and anxiety: Secondary | ICD-10-CM | POA: Diagnosis not present

## 2022-02-11 DIAGNOSIS — Z9181 History of falling: Secondary | ICD-10-CM | POA: Diagnosis not present

## 2022-02-11 DIAGNOSIS — R293 Abnormal posture: Secondary | ICD-10-CM | POA: Diagnosis not present

## 2022-02-11 DIAGNOSIS — R4189 Other symptoms and signs involving cognitive functions and awareness: Secondary | ICD-10-CM | POA: Diagnosis not present

## 2022-02-11 DIAGNOSIS — R278 Other lack of coordination: Secondary | ICD-10-CM | POA: Diagnosis not present

## 2022-02-16 ENCOUNTER — Non-Acute Institutional Stay (SKILLED_NURSING_FACILITY): Payer: Medicare Other | Admitting: Internal Medicine

## 2022-02-16 DIAGNOSIS — F3341 Major depressive disorder, recurrent, in partial remission: Secondary | ICD-10-CM

## 2022-02-16 DIAGNOSIS — E538 Deficiency of other specified B group vitamins: Secondary | ICD-10-CM | POA: Diagnosis not present

## 2022-02-16 DIAGNOSIS — I10 Essential (primary) hypertension: Secondary | ICD-10-CM | POA: Diagnosis not present

## 2022-02-16 DIAGNOSIS — I5032 Chronic diastolic (congestive) heart failure: Secondary | ICD-10-CM | POA: Diagnosis not present

## 2022-02-16 DIAGNOSIS — F0150 Vascular dementia without behavioral disturbance: Secondary | ICD-10-CM

## 2022-02-16 DIAGNOSIS — Z9989 Dependence on other enabling machines and devices: Secondary | ICD-10-CM

## 2022-02-16 DIAGNOSIS — G4733 Obstructive sleep apnea (adult) (pediatric): Secondary | ICD-10-CM

## 2022-02-16 DIAGNOSIS — E039 Hypothyroidism, unspecified: Secondary | ICD-10-CM

## 2022-02-16 NOTE — Progress Notes (Signed)
Location:  [facility]up[facility] psychologist of Service:  SNF (31)  Provider:   Code Status: DNR Goals of Care:     01/19/2022   10:52 AM  Advanced Directives  Does Patient Have a Medical Advance Directive? Yes  Type of Advance Directive Out of facility DNR (pink MOST or yellow form)  Does patient want to make changes to medical advance directive? No - Patient declined     Chief Complaint  Patient presents with   Chronic Care Management    HPI: Patient is a 88 y.o. female seen today for medical management of chronic diseases.   Lives in SNF in [place]  Patient has h/o Chronic Diastolic CHF, Urinary Incontinence, HTN, Osteoporosis, HLD, B 12 Def, anemia, H/o  Breast cancer and Vascular Dementia  h/o SAH and OSA Has h/o Breast lump No Work up per her request    She is stable. No new Nursing issues. No Behavior issues Stays in her Wheelchair mostly Has lost weight since my last visit almost 10 lbs 249 to 228 lbs  But eating 75 % of her meals No Falls She did not have any complains except that she feels tired Past Medical History:  Diagnosis Date   Breast cancer ([place])    Colon polyps    Coronary artery disease 06/24/2006   STEMI inferior with BMS to mid RCA with mid basal inferior wall HK and luminal irrgularities elsewhere   Fracture of femoral neck, right ([employer]) 06/13/2011   Fracture of humeral head, right, closed 06/13/2011   GERD (gastroesophageal reflux disease)    Hemorrhoids    Hyperlipemia    Hypertension    LBBB (left bundle branch block)    Macular degeneration    Myocardial infarction ([place])    Obstructive sleep apnea on CPAP    Osteopenia    Osteopenia    Pacemaker 02/25/2014   Rhinitis    Rotator cuff syndrome    SAH (subarachnoid hemorrhage) ([place]) 02/22/2014   Stokes Adams attack 02/22/2014   Ulnar neuropathy    Umbilical hernia    Unstable gait    Vascular dementia without behavioral disturbance ([place]) 07/04/2019   MMSE 10/11/19 28/30,  06/08/20 28/30   Wears hearing aid    Per [facility] records    Past Surgical History:  Procedure Laterality Date   BREAST LUMPECTOMY     CATARACT EXTRACTION, BILATERAL     Per [facility]   CORONARY ANGIOPLASTY WITH STENT PLACEMENT  06/2006   Mid RCA BMS   HIP ARTHROPLASTY  06/06/2011   Procedure: ARTHROPLASTY BIPOLAR HIP;  Surgeon: Gearlean Alf;  Location: [facility];  Service: Orthopedics;  Laterality: Right;   PERMANENT PACEMAKER INSERTION N/A 02/25/2014   Procedure: PERMANENT PACEMAKER INSERTION;  Surgeon: Evans Lance, MD;  Location: [facility] CATH LAB;  Service: Cardiovascular;  Laterality: N/A;   SHOULDER SURGERY     TONSILLECTOMY     Per [facility]    WRIST SURGERY      Allergies  Allergen Reactions   Chlor-Trimeton [Chlorpheniramine] Anaphylaxis   Codeine Anaphylaxis   Nystatin Rash    redness   Amlodipine     Per [facility]     Outpatient Encounter Medications as of 02/16/2022  Medication Sig   acetaminophen (TYLENOL) 500 MG tablet Take 500 mg by mouth every 8 (eight) hours as needed.    aspirin EC 81 MG tablet Take 81 mg by mouth daily.   Carboxymethylcellulose Sodium (ARTIFICIAL TEARS OP)  Apply 1 drop to eye as needed.   Chlorhexidine Gluconate 4 % SOLN Apply 1 application topically daily. On Tuesday or Friday   cholecalciferol (VITAMIN D) 25 MCG (1000 UNIT) tablet Take 3,000 Units by mouth daily. In the morning between 8-11 am   donepezil (ARICEPT) 10 MG tablet Take 1 tablet (10 mg total) by mouth at bedtime.   DULoxetine (CYMBALTA) 60 MG capsule Take 60 mg by mouth daily.   furosemide (LASIX) 40 MG tablet Take 40 mg by mouth daily. And prn edema   furosemide (LASIX) 40 MG tablet Take 40 mg by mouth as needed for edema or fluid.   levothyroxine (SYNTHROID, LEVOTHROID) 25 MCG tablet Take 25 mcg by mouth daily before breakfast.    losartan (COZAAR) 100 MG tablet Take 100 mg by mouth daily.   miconazole (MICOTIN) 2 % cream Apply 1  application topically as needed (Apply to rash under breasts and abdominal folds as needed).   mineral oil-hydrophilic petrolatum (AQUAPHOR) ointment Apply topically as needed for dry skin.   Multiple Vitamins-Minerals (PRESERVISION AREDS 2+MULTI VIT PO) Take by mouth 2 (two) times daily.   nebivolol (BYSTOLIC) 10 MG tablet Take 1 tablet (10 mg total) by mouth daily.   nitroGLYCERIN (NITROSTAT) 0.4 MG SL tablet Place 0.4 mg under the tongue every 5 (five) minutes as needed.   potassium chloride SA (K-DUR,KLOR-CON) 20 MEQ tablet Take 20 mEq by mouth daily.    sodium fluoride (PREVIDENT 5000 PLUS) 1.1 % CREA dental cream Place 1 application  onto teeth at bedtime.   UNABLE TO FIND CPAP Machine   vitamin B-12 (CYANOCOBALAMIN) 1000 MCG tablet Take 1,000 mcg by mouth daily.   No facility-administered encounter medications on file as of 02/16/2022.    Review of Systems:  Review of Systems  Unable to perform ROS: Dementia    Health Maintenance  Topic Date Due   COVID-19 Vaccine (4 - Moderna series) 01/18/2022   TETANUS/TDAP  01/17/2023   Pneumonia Vaccine 57+ Years old  Completed   INFLUENZA VACCINE  Completed   DEXA SCAN  Completed   Zoster Vaccines- Shingrix  Completed   HPV VACCINES  Aged Out    Physical Exam: Vitals:   02/16/22 1814  BP: 108/68  Pulse: 63  Resp: 18  Temp: (!) 97.3 F (36.3 C)  SpO2: 92%  Weight: 228 lb (103.4 kg)   Body mass index is 44.53 kg/m. Physical Exam Vitals reviewed.  Constitutional:      Appearance: Normal appearance.  HENT:     Head: Normocephalic.     Nose: Nose normal.     Mouth/Throat:     Mouth: Mucous membranes are moist.     Pharynx: Oropharynx is clear.  Eyes:     Pupils: Pupils are equal, round, and reactive to light.  Cardiovascular:     Rate and Rhythm: Normal rate and regular rhythm.     Pulses: Normal pulses.     Heart sounds: Murmur heard.  Pulmonary:     Effort: Pulmonary effort is normal.     Breath sounds: Normal  breath sounds.  Abdominal:     General: Abdomen is flat. Bowel sounds are normal.     Palpations: Abdomen is soft.  Musculoskeletal:        General: Swelling present.     Cervical back: Neck supple.  Skin:    General: Skin is warm.  Neurological:     General: No focal deficit present.     Mental Status: She is  alert and oriented to person, place, and time.  Psychiatric:        Mood and Affect: Mood normal.        Thought Content: Thought content normal.     Labs reviewed: Basic Metabolic Panel: Recent Labs    05/14/21 0000 10/19/21 0000 01/16/22 0000  NA 139 142 143  K 4.5 4.0 4.0  CL 100 104 105  CO2 27* 29* 27*  BUN '16 15 13  '$ CREATININE 1.0 1.0 0.9  CALCIUM 9.7 9.0 9.1  TSH  --  4.37  --    Liver Function Tests: Recent Labs    05/14/21 0000 10/19/21 0000 01/16/22 0000  AST 18 14  --   ALT 11 7  --   ALKPHOS 113 104  --   ALBUMIN 3.9 3.5 3.3*   No results for input(s): "LIPASE", "AMYLASE" in the last 8760 hours. No results for input(s): "AMMONIA" in the last 8760 hours. CBC: Recent Labs    05/14/21 0000 10/19/21 0000 11/12/21 0000 01/16/22 0000  WBC 8.4 7.3 8.1 7.6  NEUTROABS 4.70  --   --   --   HGB 12.9 11.2* 12.1 11.7*  HCT 39 34* 38 34*  PLT 244 223 213 230   Lipid Panel: No results for input(s): "CHOL", "HDL", "LDLCALC", "TRIG", "CHOLHDL", "LDLDIRECT" in the last 8760 hours. Lab Results  Component Value Date   HGBA1C 6.1 03/01/2019    Procedures since last visit: No results found.  Assessment/Plan 1. Vascular dementia without behavioral disturbance (HCC) On Aricept and Aspirin Doing well in SNF  2. Primary hypertension on Losartan and Nabivolol 3. Chronic diastolic heart failure (HCC) On Lasix Creat stable  4. Obstructive sleep apnea on CPAP Compliant with CPAP  5. Depression, major, recurrent, in partial remission (HCC) Cymbalta  6. Acquired hypothyroidism TSH normal in 05/23  7. B12  deficiency Supplement    Labs/tests ordered:  * No order type specified * Next appt:  Visit date not found

## 2022-02-18 DIAGNOSIS — F01B0 Vascular dementia, moderate, without behavioral disturbance, psychotic disturbance, mood disturbance, and anxiety: Secondary | ICD-10-CM | POA: Diagnosis not present

## 2022-02-18 DIAGNOSIS — R293 Abnormal posture: Secondary | ICD-10-CM | POA: Diagnosis not present

## 2022-02-18 DIAGNOSIS — R4189 Other symptoms and signs involving cognitive functions and awareness: Secondary | ICD-10-CM | POA: Diagnosis not present

## 2022-02-18 DIAGNOSIS — R278 Other lack of coordination: Secondary | ICD-10-CM | POA: Diagnosis not present

## 2022-02-18 DIAGNOSIS — Z9181 History of falling: Secondary | ICD-10-CM | POA: Diagnosis not present

## 2022-02-23 NOTE — Progress Notes (Signed)
Remote pacemaker transmission.   

## 2022-02-24 DIAGNOSIS — F01B0 Vascular dementia, moderate, without behavioral disturbance, psychotic disturbance, mood disturbance, and anxiety: Secondary | ICD-10-CM | POA: Diagnosis not present

## 2022-02-24 DIAGNOSIS — R4189 Other symptoms and signs involving cognitive functions and awareness: Secondary | ICD-10-CM | POA: Diagnosis not present

## 2022-02-24 DIAGNOSIS — R278 Other lack of coordination: Secondary | ICD-10-CM | POA: Diagnosis not present

## 2022-02-24 DIAGNOSIS — R293 Abnormal posture: Secondary | ICD-10-CM | POA: Diagnosis not present

## 2022-02-24 DIAGNOSIS — Z9181 History of falling: Secondary | ICD-10-CM | POA: Diagnosis not present

## 2022-03-01 ENCOUNTER — Non-Acute Institutional Stay (SKILLED_NURSING_FACILITY): Payer: Medicare Other | Admitting: Adult Health

## 2022-03-01 ENCOUNTER — Encounter: Payer: Self-pay | Admitting: Adult Health

## 2022-03-01 DIAGNOSIS — L210 Seborrhea capitis: Secondary | ICD-10-CM | POA: Diagnosis not present

## 2022-03-01 MED ORDER — KETOCONAZOLE 1 % EX SHAM: 1.0000 "application " | MEDICATED_SHAMPOO | CUTANEOUS | 0 refills | Status: DC

## 2022-03-01 NOTE — Progress Notes (Signed)
Location:  [place] Room Number: 124A Place of Service:  SNF [phone]) Provider:  Royal Hawthorn, NP  Virgie Dad, MD  Patient Care Team: Virgie Dad, MD as PCP - General (Internal Medicine) Sueanne Margarita, MD as PCP - Sleep Medicine (Sleep Medicine) Evans Lance, MD as PCP - Cardiology (Cardiology) Justice Britain, MD as Consulting Physician (Orthopedic Surgery) Gaynelle Arabian, MD as Consulting Physician (Orthopedic Surgery) Garvin Fila, MD as Consulting Physician (Neurology) Evans Lance, MD as Consulting Physician (Cardiology) Jettie Booze, MD as Consulting Physician (Interventional Cardiology) Shon Hough, MD as Consulting Physician (Ophthalmology) Community, [facility] ([place]) Royal Hawthorn, NP as Nurse Practitioner (Nurse Practitioner)  Extended Emergency Contact Information Primary Emergency Contact: Hirschmann,JED Address: [place] [place], [place] Phone: [phone] Mobile Phone: [phone] Relation: Son Secondary Emergency Contact: [place] Mobile Phone: [phone] Relation: Daughter  Code Status:  DNR Goals of care: Advanced Directive information    01/19/2022   10:52 AM  Advanced Directives  Does Patient Have a Medical Advance Directive? Yes  Type of Advance Directive Out of facility DNR (pink MOST or yellow form)  Does patient want to make changes to medical advance directive? No - Patient declined     Chief Complaint  Patient presents with   Acute Visit    Rash    HPI:  Pt is a 88 y.o. female seen today for an acute visit for rash  Nurse reports red itchy rash to scalp communicated by SBAR. Pt has dementia and can not contribute to the hx, not sure how long it has been there.   No other complaints at this time.    Past Medical History:  Diagnosis Date   Breast cancer ([place])    Colon polyps     Coronary artery disease 06/24/2006   STEMI inferior with BMS to mid RCA with mid basal inferior wall HK and luminal irrgularities elsewhere   Fracture of femoral neck, right ([place]) 06/13/2011   Fracture of humeral head, right, closed 06/13/2011   GERD (gastroesophageal reflux disease)    Hemorrhoids    Hyperlipemia    Hypertension    LBBB (left bundle branch block)    Macular degeneration    Myocardial infarction ([place])    Obstructive sleep apnea on CPAP    Osteopenia    Osteopenia    Pacemaker 02/25/2014   Rhinitis    Rotator cuff syndrome    SAH (subarachnoid hemorrhage) ([place]) 02/22/2014   Stokes Adams attack 02/22/2014   Ulnar neuropathy    Umbilical hernia    Unstable gait    Wears hearing aid    Per [facility] records   Past Surgical History:  Procedure Laterality Date   BREAST LUMPECTOMY     CATARACT EXTRACTION, BILATERAL     Per [facility]   CORONARY ANGIOPLASTY WITH STENT PLACEMENT  06/2006   Mid RCA BMS   HIP ARTHROPLASTY  06/06/2011   Procedure: ARTHROPLASTY BIPOLAR HIP;  Surgeon: Gearlean Alf;  Location: [facility]S;  Service: Orthopedics;  Laterality: Right;   PERMANENT PACEMAKER INSERTION N/A 02/25/2014   Procedure: PERMANENT PACEMAKER INSERTION;  Surgeon: Evans Lance, MD;  Location: [facility] CATH LAB;  Service: Cardiovascular;  Laterality: N/A;   SHOULDER SURGERY     TONSILLECTOMY     Per [facility]    WRIST SURGERY      Allergies  Allergen Reactions   Chlor-Trimeton [Chlorpheniramine] Anaphylaxis   Codeine Anaphylaxis   Nystatin Rash    redness   Amlodipine     Per [facility]     Outpatient Encounter Medications as of 03/01/2022  Medication Sig   acetaminophen (TYLENOL) 500 MG tablet Take 500 mg by mouth every 8 (eight) hours as needed.    aspirin EC 81 MG tablet Take 81 mg by mouth daily.   Carboxymethylcellulose Sodium (ARTIFICIAL TEARS OP) Apply 1 drop to eye as needed.   Chlorhexidine Gluconate 4 % SOLN Apply 1  application topically daily. On Tuesday or Friday   cholecalciferol (VITAMIN D) 25 MCG (1000 UNIT) tablet Take 3,000 Units by mouth daily. In the morning between 8-11 am   donepezil (ARICEPT) 10 MG tablet Take 1 tablet (10 mg total) by mouth at bedtime.   DULoxetine (CYMBALTA) 60 MG capsule Take 60 mg by mouth daily.   furosemide (LASIX) 40 MG tablet Take 40 mg by mouth daily. And prn edema   furosemide (LASIX) 40 MG tablet Take 40 mg by mouth as needed for edema or fluid.   levothyroxine (SYNTHROID, LEVOTHROID) 25 MCG tablet Take 25 mcg by mouth daily before breakfast.    losartan (COZAAR) 100 MG tablet Take 100 mg by mouth daily.   miconazole (MICOTIN) 2 % cream Apply 1 application topically as needed (Apply to rash under breasts and abdominal folds as needed).   mineral oil-hydrophilic petrolatum (AQUAPHOR) ointment Apply topically as needed for dry skin.   Multiple Vitamins-Minerals (PRESERVISION AREDS 2+MULTI VIT PO) Take by mouth 2 (two) times daily.   nebivolol (BYSTOLIC) 10 MG tablet Take 1 tablet (10 mg total) by mouth daily.   nitroGLYCERIN (NITROSTAT) 0.4 MG SL tablet Place 0.4 mg under the tongue every 5 (five) minutes as needed.   potassium chloride SA (K-DUR,KLOR-CON) 20 MEQ tablet Take 20 mEq by mouth daily.    sodium fluoride (PREVIDENT 5000 PLUS) 1.1 % CREA dental cream Place 1 application  onto teeth at bedtime.   UNABLE TO FIND CPAP Machine   vitamin B-12 (CYANOCOBALAMIN) 1000 MCG tablet Take 1,000 mcg by mouth daily.   No facility-administered encounter medications on file as of 03/01/2022.    Review of Systems  Constitutional:  Negative for activity change, appetite change, chills, diaphoresis and fatigue.  Respiratory:  Negative for shortness of breath.   Musculoskeletal:  Positive for gait problem.  Skin:  Positive for rash.  Psychiatric/Behavioral:  Positive for confusion.     Immunization History  Administered Date(s) Administered   Influenza, High Dose Seasonal  PF 02/18/2017, 03/22/2019, 03/14/2020   Influenza,inj,Quad PF,6+ Mos 03/24/2018   Influenza-Unspecified 03/04/2016, 03/18/2020, 02/25/2021   Moderna Covid-19 Vaccine Bivalent Booster 4yr & up 09/18/2021   Moderna SARS-COV2 Booster Vaccination 04/03/2020, 01/13/2021   Moderna Sars-Covid-2 Vaccination 06/04/2019, 07/03/2019, 03/04/2021   Pneumococcal Conjugate-13 05/08/2014   Pneumococcal Polysaccharide-23 10/08/2017   Td 07/09/2002, 01/16/2013   Zoster Recombinat (Shingrix) 11/18/2017, 01/17/2018   Zoster, Live 06/30/2010   Pertinent  Health Maintenance Due  Topic Date Due   INFLUENZA VACCINE  12/22/2021   DEXA SCAN  Completed      10/18/2018    1:31 PM 12/21/2018   11:33 AM 07/04/2019    9:05 AM 01/15/2021    1:46 PM 08/18/2021   12:45 PM  [place] the past year? 0 0 0  1  Was there an injury with Fall? 0 0 0  0  Fall Risk Category Calculator  0 0 0  1  Fall Risk Category Low Low Low  Low  Patient Fall Risk Level Low fall risk  Low fall risk Moderate fall risk Moderate fall risk  Patient at Risk for Falls Due to  Impaired mobility  History of fall(s) History of fall(s);Impaired balance/gait;Impaired mobility;Orthopedic patient  Patient at Risk for Falls Due to - Comments     chronic knee pain  Fall risk Follow up  Falls evaluation completed;Falls prevention discussed  Falls evaluation completed;Follow up appointment Education provided;Falls prevention discussed   Functional Status Survey:    Vitals:   03/01/22 0914  BP: 108/68  Pulse: 63  Temp: (!) 97.3 F (36.3 C)  SpO2: 92%  Weight: 228 lb 9.6 oz (103.7 kg)  Height: 5' (1.524 m)   Body mass index is 44.65 kg/m. Physical Exam Vitals reviewed.  HENT:     Head:     Comments: Chronic alopecia Erythema to posterior scalp. Dry flaky scalp throughout. No purulent matter.  Neurological:     Mental Status: She is alert.     Labs reviewed: Recent Labs    05/14/21 0000 10/19/21 0000 01/16/22 0000  NA  139 142 143  K 4.5 4.0 4.0  CL 100 104 105  CO2 27* 29* 27*  BUN '16 15 13  '$ CREATININE 1.0 1.0 0.9  CALCIUM 9.7 9.0 9.1   Recent Labs    05/14/21 0000 10/19/21 0000 01/16/22 0000  AST 18 14  --   ALT 11 7  --   ALKPHOS 113 104  --   ALBUMIN 3.9 3.5 3.3*   Recent Labs    05/14/21 0000 10/19/21 0000 11/12/21 0000 01/16/22 0000  WBC 8.4 7.3 8.1 7.6  NEUTROABS 4.70  --   --   --   HGB 12.9 11.2* 12.1 11.7*  HCT 39 34* 38 34*  PLT 244 223 213 230   Lab Results  Component Value Date   TSH 4.37 10/19/2021   Lab Results  Component Value Date   HGBA1C 6.1 03/01/2019   Lab Results  Component Value Date   CHOL 200 10/18/2019   HDL 44 10/18/2019   LDLCALC 129 10/18/2019   TRIG 137 10/18/2019    Significant Diagnostic Results in last 30 days:  CUP PACEART REMOTE DEVICE CHECK  Result Date: 02/09/2022 Scheduled remote reviewed. Normal device function.  1 AMS, 6 sec (short PAT) Next remote 91 days- JJB   Assessment/Plan  1. Seborrhea capitis  - KETOCONAZOLE, TOPICAL, 1 % SHAM; Apply 1 application  topically 2 (two) times a week. Leave on for 15 min then rinse.; Refill: 0  Family/ staff Communication: resident   Labs/tests ordered:  NA

## 2022-03-03 ENCOUNTER — Encounter: Payer: Self-pay | Admitting: Orthopedic Surgery

## 2022-03-14 ENCOUNTER — Encounter: Payer: Self-pay | Admitting: Internal Medicine

## 2022-03-16 ENCOUNTER — Encounter: Payer: Self-pay | Admitting: Orthopedic Surgery

## 2022-03-16 ENCOUNTER — Non-Acute Institutional Stay (SKILLED_NURSING_FACILITY): Payer: Medicare Other | Admitting: Orthopedic Surgery

## 2022-03-16 DIAGNOSIS — N63 Unspecified lump in unspecified breast: Secondary | ICD-10-CM

## 2022-03-16 DIAGNOSIS — I10 Essential (primary) hypertension: Secondary | ICD-10-CM | POA: Diagnosis not present

## 2022-03-16 DIAGNOSIS — E039 Hypothyroidism, unspecified: Secondary | ICD-10-CM | POA: Diagnosis not present

## 2022-03-16 DIAGNOSIS — F3341 Major depressive disorder, recurrent, in partial remission: Secondary | ICD-10-CM | POA: Diagnosis not present

## 2022-03-16 DIAGNOSIS — E538 Deficiency of other specified B group vitamins: Secondary | ICD-10-CM | POA: Diagnosis not present

## 2022-03-16 DIAGNOSIS — F0150 Vascular dementia without behavioral disturbance: Secondary | ICD-10-CM | POA: Diagnosis not present

## 2022-03-16 DIAGNOSIS — I5032 Chronic diastolic (congestive) heart failure: Secondary | ICD-10-CM

## 2022-03-16 DIAGNOSIS — I4430 Unspecified atrioventricular block: Secondary | ICD-10-CM

## 2022-03-16 DIAGNOSIS — G4733 Obstructive sleep apnea (adult) (pediatric): Secondary | ICD-10-CM | POA: Diagnosis not present

## 2022-03-16 NOTE — Progress Notes (Signed)
Location:  [place] Room Number: 124/A Place of Service:  SNF [phone]) Provider:  Yvonna Alanis, NP   Virgie Dad, MD  Patient Care Team: Virgie Dad, MD as PCP - General (Internal Medicine) Sueanne Margarita, MD as PCP - Sleep Medicine (Sleep Medicine) Evans Lance, MD as PCP - Cardiology (Cardiology) Justice Britain, MD as Consulting Physician (Orthopedic Surgery) Gaynelle Arabian, MD as Consulting Physician (Orthopedic Surgery) Garvin Fila, MD as Consulting Physician (Neurology) Evans Lance, MD as Consulting Physician (Cardiology) Jettie Booze, MD as Consulting Physician (Interventional Cardiology) Shon Hough, MD as Consulting Physician (Ophthalmology) Community, [facility] ([place]) Royal Hawthorn, NP as Nurse Practitioner (Nurse Practitioner)  Extended Emergency Contact Information Primary Emergency Contact: Vandeberg,JED Address: [place], [place] of [place] Phone: [phone] Mobile Phone: [phone] Relation: Son Secondary Emergency Contact: [place] Mobile Phone: [phone] Relation: Daughter  Code Status:  DNR Goals of care: Advanced Directive information    01/19/2022   10:52 AM  Advanced Directives  Does Patient Have a Medical Advance Directive? Yes  Type of Advance Directive Out of facility DNR (pink MOST or yellow form)  Does patient want to make changes to medical advance directive? No - Patient declined     Chief Complaint  Patient presents with   Medical Management of Chronic Issues    HPI:  Pt is a 88 y.o. female seen today for medical management of chronic diseases.    She continues to reside on the skilled nursing unit at [facility] PMH: aortic insufficiency, diastolic heart failure, CAD, HTN, MI, OSA on CPAP, GERD, hypothyroidism, obesity, h/o breast cancer and unstable gait.    Vascular dementia- MMSE  28/30, CT head 2019 noted moderate atrophy with moderate small vessel ischemic changes of white matter, agitation noted with ADLs, ambulates in wheelchair, noted to be sleeping more- recent workup unremarkable, remains on donepezil  HTN- BUN/creat 13/0.9 01/16/2022, remains on losartan and Bystolic AV block- s/p pacemaker- checked 50/27/7412 Diastolic heart failure- no weight fluctuations, sob, mild ankle edema, wears ted hose daily, remains on furosemide and potassium daily OSA- CPAP qhs Depression- no changes in mood, grieves husband at times, remains on Cymbalta, Na + 143 01/16/2022 Hypothyroidism- TSH 4.37 10/19/2021, remains on levothyroxine B12 deficiency- hgb 11.7 01/16/2022, remains on cyanocobalamin daily Left breast lump- no workup per patient request, denies pain today  No recent falls or injuries.   Recent blood pressures:  10/17- 115/68  10/10- 114/65  09/26- 108/68  Recent weights:  10/01- 233.8 lbs  09/01- 227.5 lbs  08/01- 235 lbs         Past Medical History:  Diagnosis Date   Breast cancer ([place])    Colon polyps    Coronary artery disease 06/24/2006   STEMI inferior with BMS to mid RCA with mid basal inferior wall HK and luminal irrgularities elsewhere   Fracture of femoral neck, right ([place]) 06/13/2011   Fracture of humeral head, right, closed 06/13/2011   GERD (gastroesophageal reflux disease)    Hemorrhoids    Hyperlipemia    Hypertension    LBBB (left bundle branch block)    Macular degeneration    Myocardial infarction ([place])    Obstructive sleep apnea on CPAP    Osteopenia    Osteopenia    Pacemaker 02/25/2014   Rhinitis    Rotator cuff syndrome    SAH (subarachnoid hemorrhage) (  [place]) 02/22/2014   Stokes Adams attack 02/22/2014   Ulnar neuropathy    Umbilical hernia    Unstable gait    Vascular dementia without behavioral disturbance ([place]) 07/04/2019   MMSE 10/11/19 28/30, 06/08/20 28/30   Wears hearing aid    Per [facility] records   Past  Surgical History:  Procedure Laterality Date   BREAST LUMPECTOMY     CATARACT EXTRACTION, BILATERAL     Per [facility]   CORONARY ANGIOPLASTY WITH STENT PLACEMENT  06/2006   Mid RCA BMS   HIP ARTHROPLASTY  06/06/2011   Procedure: ARTHROPLASTY BIPOLAR HIP;  Surgeon: Gearlean Alf;  Location: [facility]S;  Service: Orthopedics;  Laterality: Right;   PERMANENT PACEMAKER INSERTION N/A 02/25/2014   Procedure: PERMANENT PACEMAKER INSERTION;  Surgeon: Evans Lance, MD;  Location: [facility] C[facility] LAB;  Service: Cardiovascular;  Laterality: N/A;   SHOULDER SURGERY     TONSILLECTOMY     Per [facility]    WRIST SURGERY      Allergies  Allergen Reactions   Chlor-Trimeton [Chlorpheniramine] Anaphylaxis   Codeine Anaphylaxis   Nystatin Rash    redness   Amlodipine     Per [facility]     Outpatient Encounter Medications as of 03/16/2022  Medication Sig   acetaminophen (TYLENOL) 500 MG tablet Take 500 mg by mouth every 8 (eight) hours as needed.    aspirin EC 81 MG tablet Take 81 mg by mouth daily.   Carboxymethylcellulose Sodium (ARTIFICIAL TEARS OP) Apply 1 drop to eye as needed.   Chlorhexidine Gluconate 4 % SOLN Apply 1 application topically daily. On Tuesday or Friday   cholecalciferol (VITAMIN D) 25 MCG (1000 UNIT) tablet Take 3,000 Units by mouth daily. In the morning between 8-11 am   donepezil (ARICEPT) 10 MG tablet Take 1 tablet (10 mg total) by mouth at bedtime.   DULoxetine (CYMBALTA) 60 MG capsule Take 60 mg by mouth daily.   furosemide (LASIX) 40 MG tablet Take 40 mg by mouth daily. And prn edema   furosemide (LASIX) 40 MG tablet Take 40 mg by mouth as needed for edema or fluid.   KETOCONAZOLE, TOPICAL, 1 % SHAM Apply 1 application  topically 2 (two) times a week. Leave on for 15 min then rinse.   levothyroxine (SYNTHROID, LEVOTHROID) 25 MCG tablet Take 25 mcg by mouth daily before breakfast.    losartan (COZAAR) 100 MG tablet Take 100 mg by mouth  daily.   miconazole (MICOTIN) 2 % cream Apply 1 application topically as needed (Apply to rash under breasts and abdominal folds as needed).   mineral oil-hydrophilic petrolatum (AQUAPHOR) ointment Apply topically as needed for dry skin.   Multiple Vitamins-Minerals (PRESERVISION AREDS 2+MULTI VIT PO) Take by mouth 2 (two) times daily.   nebivolol (BYSTOLIC) 10 MG tablet Take 1 tablet (10 mg total) by mouth daily.   nitroGLYCERIN (NITROSTAT) 0.4 MG SL tablet Place 0.4 mg under the tongue every 5 (five) minutes as needed.   potassium chloride SA (K-DUR,KLOR-CON) 20 MEQ tablet Take 20 mEq by mouth daily.    sodium fluoride (PREVIDENT 5000 PLUS) 1.1 % CREA dental cream Place 1 application  onto teeth at bedtime.   UNABLE TO FIND CPAP Machine   vitamin B-12 (CYANOCOBALAMIN) 1000 MCG tablet Take 1,000 mcg by mouth daily.   No facility-administered encounter medications on file as of 03/16/2022.    Review of Systems  Constitutional:  Negative for activity change, appetite change, chills, fatigue and  fever.  HENT:  Negative for congestion and trouble swallowing.   Eyes:  Negative for visual disturbance.  Respiratory:  Negative for cough, shortness of breath and wheezing.   Cardiovascular:  Positive for leg swelling. Negative for chest pain.  Gastrointestinal:  Negative for abdominal pain, constipation, diarrhea, nausea and vomiting.  Genitourinary:  Negative for dysuria and frequency.  Musculoskeletal:  Positive for arthralgias and gait problem.  Skin:  Negative for wound.  Neurological:  Positive for weakness. Negative for dizziness and headaches.  Psychiatric/Behavioral:  Positive for confusion and dysphoric mood. Negative for sleep disturbance. The patient is not nervous/anxious.     Immunization History  Administered Date(s) Administered   Influenza, High Dose Seasonal PF 02/18/2017, 03/22/2019, 03/14/2020   Influenza,inj,Quad PF,6+ Mos 03/24/2018   Influenza-Unspecified 03/04/2016,  03/18/2020, 02/25/2021, 02/26/2022   Moderna Covid-19 Vaccine Bivalent Booster 27yr & up 09/18/2021   Moderna SARS-COV2 Booster Vaccination 04/03/2020, 01/13/2021   Moderna Sars-Covid-2 Vaccination 06/04/2019, 07/03/2019, 03/04/2021   Pneumococcal Conjugate-13 05/08/2014   Pneumococcal Polysaccharide-23 10/08/2017   Td 07/09/2002, 01/16/2013   Zoster Recombinat (Shingrix) 11/18/2017, 01/17/2018   Zoster, Live 06/30/2010   Pertinent  Health Maintenance Due  Topic Date Due   INFLUENZA VACCINE  Completed   DEXA SCAN  Completed      10/18/2018    1:31 PM 12/21/2018   11:33 AM 07/04/2019    9:05 AM 01/15/2021    1:46 PM 08/18/2021   12:45 PM  [place]in the past year? 0 0 0  1  Was there an injury with Fall? 0 0 0  0  Fall Risk Category Calculator 0 0 0  1  Fall Risk Category Low Low Low  Low  Patient Fall Risk Level Low fall risk  Low fall risk Moderate fall risk Moderate fall risk  Patient at Risk for Falls Due to  Impaired mobility  History of fall(s) History of fall(s);Impaired balance/gait;Impaired mobility;Orthopedic patient  Patient at Risk for Falls Due to - Comments     chronic knee pain  Fall risk Follow up  Falls evaluation completed;Falls prevention discussed  Falls evaluation completed;Follow up appointment Education provided;Falls prevention discussed   Functional Status Survey:    Vitals:   03/16/22 1453  BP: 115/68  Pulse: 63  Resp: 17  Temp: (!) 97 F (36.1 C)  SpO2: 98%  Weight: 228 lb 9.6 oz (103.7 kg)   Body mass index is 44.65 kg/m. Physical Exam Vitals reviewed.  Constitutional:      General: She is not in acute distress.    Appearance: She is obese.  HENT:     Head: Normocephalic.     Right Ear: There is no impacted cerumen.     Left Ear: There is no impacted cerumen.     Nose: Nose normal.     Mouth/Throat:     Mouth: Mucous membranes are moist.  Eyes:     General:        Right eye: No discharge.        Left eye: No discharge.   Cardiovascular:     Rate and Rhythm: Normal rate and regular rhythm.     Pulses: Normal pulses.     Heart sounds: Normal heart sounds.  Pulmonary:     Effort: Pulmonary effort is normal. No respiratory distress.     Breath sounds: Normal breath sounds. No wheezing or rales.  Abdominal:     General: Bowel sounds are normal. There is no distension.  Tenderness: There is no abdominal tenderness.  Musculoskeletal:     Cervical back: Neck supple.     Right lower leg: Edema present.     Left lower leg: Edema present.     Comments: Non pitting, ted hose on  Skin:    General: Skin is warm and dry.     Capillary Refill: Capillary refill takes less than 2 seconds.  Neurological:     General: No focal deficit present.     Mental Status: Mental status is at baseline.     Motor: Weakness present.     Gait: Gait abnormal.     Comments: wheelchair  Psychiatric:        Mood and Affect: Mood normal.        Behavior: Behavior normal.     Comments: Very pleasant, follows commands, alert to self/person/place     Labs reviewed: Recent Labs    05/14/21 0000 10/19/21 0000 01/16/22 0000  NA 139 142 143  K 4.5 4.0 4.0  CL 100 104 105  CO2 27* 29* 27*  BUN '16 15 13  '$ CREATININE 1.0 1.0 0.9  CALCIUM 9.7 9.0 9.1   Recent Labs    05/14/21 0000 10/19/21 0000 01/16/22 0000  AST 18 14  --   ALT 11 7  --   ALKPHOS 113 104  --   ALBUMIN 3.9 3.5 3.3*   Recent Labs    05/14/21 0000 10/19/21 0000 11/12/21 0000 01/16/22 0000  WBC 8.4 7.3 8.1 7.6  NEUTROABS 4.70  --   --   --   HGB 12.9 11.2* 12.1 11.7*  HCT 39 34* 38 34*  PLT 244 223 213 230   Lab Results  Component Value Date   TSH 4.37 10/19/2021   Lab Results  Component Value Date   HGBA1C 6.1 03/01/2019   Lab Results  Component Value Date   CHOL 200 10/18/2019   HDL 44 10/18/2019   LDLCALC 129 10/18/2019   TRIG 137 10/18/2019    Significant Diagnostic Results in last 30 days:  No results  found.  Assessment/Plan 1. Vascular dementia without behavioral disturbance ([place]) - behaviors with ADLs - sleeping more- recent workup unremarkable - ambulates with wheelchair - cont donepezil  2. Primary hypertension - controlled - cont Bystolic and losartan  3. AV block - s/p pacemaker- checked 01/22/2022  4. Chronic diastolic heart failure (HCC) - compensated  - lung sounds clear, non pitting edema, weights stable - cont furosemide  5. Obstructive sleep apnea on CPAP - cont CPAP qhs  6. Depression, major, recurrent, in partial remission (HCC) - no mood changes - cont Cymbalta  7. Acquired hypothyroidism - cont levothyroxine  8. B12 deficiency - hgb stable - cont B12  9. Breast lump in female - no further workup per patient     Family/ staff Communication: plan discussed with patient and nurse  Labs/tests ordered:  none

## 2022-04-07 DIAGNOSIS — L820 Inflamed seborrheic keratosis: Secondary | ICD-10-CM | POA: Diagnosis not present

## 2022-04-07 DIAGNOSIS — L218 Other seborrheic dermatitis: Secondary | ICD-10-CM | POA: Diagnosis not present

## 2022-04-07 DIAGNOSIS — L570 Actinic keratosis: Secondary | ICD-10-CM | POA: Diagnosis not present

## 2022-04-07 DIAGNOSIS — I872 Venous insufficiency (chronic) (peripheral): Secondary | ICD-10-CM | POA: Diagnosis not present

## 2022-04-07 DIAGNOSIS — L814 Other melanin hyperpigmentation: Secondary | ICD-10-CM | POA: Diagnosis not present

## 2022-04-07 DIAGNOSIS — R229 Localized swelling, mass and lump, unspecified: Secondary | ICD-10-CM | POA: Diagnosis not present

## 2022-04-07 DIAGNOSIS — L853 Xerosis cutis: Secondary | ICD-10-CM | POA: Diagnosis not present

## 2022-04-20 ENCOUNTER — Encounter: Payer: Self-pay | Admitting: Orthopedic Surgery

## 2022-04-20 ENCOUNTER — Non-Acute Institutional Stay (SKILLED_NURSING_FACILITY): Payer: Medicare Other | Admitting: Orthopedic Surgery

## 2022-04-20 DIAGNOSIS — I5032 Chronic diastolic (congestive) heart failure: Secondary | ICD-10-CM

## 2022-04-20 DIAGNOSIS — F3341 Major depressive disorder, recurrent, in partial remission: Secondary | ICD-10-CM | POA: Diagnosis not present

## 2022-04-20 DIAGNOSIS — G4733 Obstructive sleep apnea (adult) (pediatric): Secondary | ICD-10-CM | POA: Diagnosis not present

## 2022-04-20 DIAGNOSIS — E039 Hypothyroidism, unspecified: Secondary | ICD-10-CM | POA: Diagnosis not present

## 2022-04-20 DIAGNOSIS — F0150 Vascular dementia without behavioral disturbance: Secondary | ICD-10-CM

## 2022-04-20 DIAGNOSIS — E538 Deficiency of other specified B group vitamins: Secondary | ICD-10-CM

## 2022-04-20 DIAGNOSIS — I10 Essential (primary) hypertension: Secondary | ICD-10-CM | POA: Diagnosis not present

## 2022-04-20 DIAGNOSIS — I4430 Unspecified atrioventricular block: Secondary | ICD-10-CM | POA: Diagnosis not present

## 2022-04-20 DIAGNOSIS — N63 Unspecified lump in unspecified breast: Secondary | ICD-10-CM | POA: Diagnosis not present

## 2022-04-20 DIAGNOSIS — I872 Venous insufficiency (chronic) (peripheral): Secondary | ICD-10-CM

## 2022-04-20 NOTE — Progress Notes (Unsigned)
Location:  [facility] Room Number: 124A Place of Service:  SNF [phone]) Provider: Windell Moulding, NP   Patient Care Team: Virgie Dad, MD as PCP - General (Internal Medicine) Sueanne Margarita, MD as PCP - Sleep Medicine (Sleep Medicine) Evans Lance, MD as PCP - Cardiology (Cardiology) Justice Britain, MD as Consulting Physician (Orthopedic Surgery) Gaynelle Arabian, MD as Consulting Physician (Orthopedic Surgery) Garvin Fila, MD as Consulting Physician (Neurology) Evans Lance, MD as Consulting Physician (Cardiology) Jettie Booze, MD as Consulting Physician (Interventional Cardiology) Shon Hough, MD as Consulting Physician (Ophthalmology) Community, [facility] ([place]) Royal Hawthorn, NP as Nurse Practitioner (Nurse Practitioner)  Extended Emergency Contact Information Primary Emergency Contact: Jeanpaul,JED Address: [place], [place] of [place] Phone: [phone] Mobile Phone: [phone] Relation: Son Secondary Emergency Contact: [place] Mobile Phone: [phone] Relation: Daughter  Code Status:  DNR Goals of care: Advanced Directive information    04/20/2022    2:28 PM  Advanced Directives  Does Patient Have a Medical Advance Directive? Yes  Type of Paramedic of [place];Out of facility DNR (pink MOST or yellow form)  Does patient want to make changes to medical advance directive? No - Patient declined  Copy of [place] in Chart? Yes - validated most recent copy scanned in chart (See row information)  Pre-existing out of facility DNR order (yellow form or pink MOST form) Yellow form placed in chart (order not valid for inpatient use)     Chief Complaint  Patient presents with   Medical Management of Chronic Issues    Routine Visit with provider on site at the following facility    Quality  Metric Gaps    To Discuss the following as listed to the Care Gaps as listed below.     HPI:  Pt is a 88 y.o. female seen today for medical management of chronic diseases.    She continues to reside on the skilled nursing unit at [facility] PMH: aortic insufficiency, diastolic heart failure, CAD, HTN, MI, OSA on CPAP, GERD, hypothyroidism, obesity, h/o breast cancer and unstable gait.    Vascular dementia- MMSE 28/30, CT head 2019 noted moderate atrophy with moderate small vessel ischemic changes of white matter, agitation with ADLs, forgets husband has passed at times, ambulates in wheelchair, remains on donepezil  HTN- BUN/creat 13/0.9 01/16/2022, remains on losartan and Bystolic AV block- s/p pacemaker- checked 44/07/4740 Diastolic heart failure- no weight fluctuations, remains on furosemide/potassium Venous insufficiency- see above OSA- CPAP qhs Depression- no changes in mood, remains on Cymbalta, Na + 143 01/16/2022 Hypothyroidism- TSH 4.37 10/19/2021, remains on levothyroxine B12 deficiency- hgb 11.7 01/16/2022, remains on cyanocobalamin daily Left breast lump- no workup per patient request, denies pain today  No recent falls or injuries.   Recent blood pressures:  11/28- 116/67  11/21- 121/80  11/07- 120/69  Recent weights:  11/01- 231 lbs  10/01- 233.8 lbs  09/01- 227.5 lbs  Past Medical History:  Diagnosis Date   Breast cancer ([place])    Colon polyps    Coronary artery disease 06/24/2006   STEMI inferior with BMS to mid RCA with mid basal inferior wall HK and luminal irrgularities elsewhere   Fracture of femoral neck, right ([place]) 06/13/2011   Fracture of humeral head, right, closed 06/13/2011   GERD (gastroesophageal reflux disease)    Hemorrhoids    Hyperlipemia  Hypertension    LBBB (left bundle branch block)    Macular degeneration    Myocardial infarction ([place])    Obstructive sleep apnea on CPAP    Osteopenia    Osteopenia    Pacemaker 02/25/2014    Rhinitis    Rotator cuff syndrome    SAH (subarachnoid hemorrhage) ([place]) 02/22/2014   Stokes Adams attack 02/22/2014   Ulnar neuropathy    Umbilical hernia    Unstable gait    Vascular dementia without behavioral disturbance ([place]) 07/04/2019   MMSE 10/11/19 28/30, 06/08/20 28/30   Wears hearing aid    Per [facility] records   Past Surgical History:  Procedure Laterality Date   BREAST LUMPECTOMY     CATARACT EXTRACTION, BILATERAL     Per [facility]   CORONARY ANGIOPLASTY WITH STENT PLACEMENT  06/2006   Mid RCA BMS   HIP ARTHROPLASTY  06/06/2011   Procedure: ARTHROPLASTY BIPOLAR HIP;  Surgeon: Gearlean Alf;  Location: [facility] ORS;  Service: Orthopedics;  Laterality: Right;   PERMANENT PACEMAKER INSERTION N/A 02/25/2014   Procedure: PERMANENT PACEMAKER INSERTION;  Surgeon: Evans Lance, MD;  Location: [facility] CATH LAB;  Service: Cardiovascular;  Laterality: N/A;   SHOULDER SURGERY     TONSILLECTOMY     Per [facility]    WRIST SURGERY      Allergies  Allergen Reactions   Chlor-Trimeton [Chlorpheniramine] Anaphylaxis   Codeine Anaphylaxis   Nystatin Rash    redness   Amlodipine     Per [facility]     Outpatient Encounter Medications as of 04/20/2022  Medication Sig   acetaminophen (TYLENOL) 500 MG tablet Take 500 mg by mouth every 8 (eight) hours as needed.    aspirin EC 81 MG tablet Take 81 mg by mouth daily.   Carboxymethylcellulose Sodium (ARTIFICIAL TEARS OP) Apply 1 drop to eye as needed.   Chlorhexidine Gluconate 4 % SOLN Apply 1 application topically daily. On Tuesday or Friday   cholecalciferol (VITAMIN D) 25 MCG (1000 UNIT) tablet Take 3,000 Units by mouth daily. In the morning between 8-11 am   donepezil (ARICEPT) 10 MG tablet Take 1 tablet (10 mg total) by mouth at bedtime.   DULoxetine (CYMBALTA) 60 MG capsule Take 60 mg by mouth daily.   furosemide (LASIX) 40 MG tablet Take 40 mg by mouth daily. And prn edema   furosemide (LASIX)  40 MG tablet Take 40 mg by mouth as needed for edema or fluid.   KETOCONAZOLE, TOPICAL, 1 % SHAM Apply 1 application  topically 2 (two) times a week. Leave on for 15 min then rinse.   levothyroxine (SYNTHROID, LEVOTHROID) 25 MCG tablet Take 25 mcg by mouth daily before breakfast.    losartan (COZAAR) 100 MG tablet Take 100 mg by mouth daily.   miconazole (MICOTIN) 2 % cream Apply 1 application topically as needed (Apply to rash under breasts and abdominal folds as needed).   mineral oil-hydrophilic petrolatum (AQUAPHOR) ointment Apply topically as needed for dry skin.   Multiple Vitamins-Minerals (PRESERVISION AREDS 2+MULTI VIT PO) Take by mouth 2 (two) times daily.   nebivolol (BYSTOLIC) 10 MG tablet Take 1 tablet (10 mg total) by mouth daily.   nitroGLYCERIN (NITROSTAT) 0.4 MG SL tablet Place 0.4 mg under the tongue every 5 (five) minutes as needed.   potassium chloride SA (K-DUR,KLOR-CON) 20 MEQ tablet Take 20 mEq by mouth daily.    sodium fluoride (PREVIDENT 5000 PLUS) 1.1 % CREA dental cream Place 1  application  onto teeth at bedtime.   vitamin B-12 (CYANOCOBALAMIN) 1000 MCG tablet Take 1,000 mcg by mouth daily.   [DISCONTINUED] UNABLE TO FIND CPAP Machine   No facility-administered encounter medications on file as of 04/20/2022.    Review of Systems  Constitutional:  Negative for activity change, appetite change, chills, fatigue and fever.  HENT:  Negative for congestion and trouble swallowing.   Eyes:  Negative for visual disturbance.  Respiratory:  Negative for cough, shortness of breath and wheezing.   Cardiovascular:  Negative for chest pain and leg swelling.  Gastrointestinal:  Negative for abdominal distention, abdominal pain, constipation, diarrhea, nausea and vomiting.  Genitourinary:  Negative for dysuria, frequency and hematuria.  Musculoskeletal:  Positive for arthralgias and gait problem.  Skin:  Negative for wound.  Neurological:  Positive for weakness. Negative for  dizziness and headaches.  Psychiatric/Behavioral:  Positive for confusion and dysphoric mood. Negative for sleep disturbance. The patient is not nervous/anxious.     Immunization History  Administered Date(s) Administered   Influenza, High Dose Seasonal PF 02/18/2017, 03/22/2019, 03/14/2020   Influenza,inj,Quad PF,6+ Mos 03/24/2018   Influenza-Unspecified 03/04/2016, 03/18/2020, 02/25/2021, 02/26/2022   Moderna Covid-19 Vaccine Bivalent Booster 28yr & up 09/18/2021, 03/29/2022   Moderna SARS-COV2 Booster Vaccination 04/03/2020, 01/13/2021   Moderna Sars-Covid-2 Vaccination 06/04/2019, 07/03/2019, 03/04/2021   Pneumococcal Conjugate-13 05/08/2014   Pneumococcal Polysaccharide-23 10/08/2017   Td 07/09/2002, 01/16/2013   Zoster Recombinat (Shingrix) 11/18/2017, 01/17/2018   Zoster, Live 06/30/2010   Pertinent  Health Maintenance Due  Topic Date Due   INFLUENZA VACCINE  Completed   DEXA SCAN  Completed      12/21/2018   11:33 AM 07/04/2019    9:05 AM 01/15/2021    1:46 PM 08/18/2021   12:45 PM 04/20/2022    2:25 PM  Fall Risk  Falls in the past year? 0 0  1 0  Was there an injury with Fall? 0 0  0 0  Fall Risk Category Calculator 0 0  1 0  Fall Risk Category Low Low  Low Low  Patient Fall Risk Level  Low fall risk Moderate fall risk Moderate fall risk Moderate fall risk  Patient at Risk for Falls Due to Impaired mobility  History of fall(s) History of fall(s);Impaired balance/gait;Impaired mobility;Orthopedic patient No Fall Risks;History of fall(s);Impaired balance/gait;Orthopedic patient;Impaired mobility  Patient at Risk for Falls Due to - Comments    chronic knee pain Chronic knee pain  Fall risk Follow up Falls evaluation completed;Falls prevention discussed  Falls evaluation completed;Follow up appointment Education provided;Falls prevention discussed Falls evaluation completed   Functional Status Survey:    Vitals:   04/20/22 1420  BP: 121/80  Pulse: 62  Resp: 16   Temp: 97.9 F (36.6 C)  SpO2: 97%  Weight: 211 lb 6 oz (95.9 kg)  Height: 5' (1.524 m)   Body mass index is 41.28 kg/m. Physical Exam Vitals reviewed.  Constitutional:      General: She is not in acute distress.    Appearance: She is obese.  HENT:     Head: Normocephalic.     Right Ear: There is no impacted cerumen.     Left Ear: There is no impacted cerumen.     Nose: Nose normal.     Mouth/Throat:     Mouth: Mucous membranes are moist.  Eyes:     General:        Right eye: No discharge.        Left eye: No discharge.  Cardiovascular:     Rate and Rhythm: Normal rate and regular rhythm.     Pulses: Normal pulses.     Heart sounds: Normal heart sounds.  Pulmonary:     Effort: Pulmonary effort is normal. No respiratory distress.     Breath sounds: Normal breath sounds. No wheezing.  Abdominal:     General: Bowel sounds are normal. There is no distension.     Palpations: Abdomen is soft.     Tenderness: There is no abdominal tenderness.  Musculoskeletal:     Cervical back: Neck supple.     Right lower leg: Edema present.     Left lower leg: Edema present.     Comments: Non pitting, ted hose on  Skin:    General: Skin is warm and dry.     Capillary Refill: Capillary refill takes less than 2 seconds.  Neurological:     General: No focal deficit present.     Mental Status: She is alert. Mental status is at baseline.     Motor: Weakness present.     Gait: Gait abnormal.  Psychiatric:        Mood and Affect: Mood normal.        Behavior: Behavior normal.     Comments: Very pleasant, forgetful, alert to self/person/place     Labs reviewed: Recent Labs    05/14/21 0000 10/19/21 0000 01/16/22 0000  NA 139 142 143  K 4.5 4.0 4.0  CL 100 104 105  CO2 27* 29* 27*  BUN '16 15 13  '$ CREATININE 1.0 1.0 0.9  CALCIUM 9.7 9.0 9.1   Recent Labs    05/14/21 0000 10/19/21 0000 01/16/22 0000  AST 18 14  --   ALT 11 7  --   ALKPHOS 113 104  --   ALBUMIN 3.9 3.5  3.3*   Recent Labs    05/14/21 0000 10/19/21 0000 11/12/21 0000 01/16/22 0000  WBC 8.4 7.3 8.1 7.6  NEUTROABS 4.70  --   --   --   HGB 12.9 11.2* 12.1 11.7*  HCT 39 34* 38 34*  PLT 244 223 213 230   Lab Results  Component Value Date   TSH 4.37 10/19/2021   Lab Results  Component Value Date   HGBA1C 6.1 03/01/2019   Lab Results  Component Value Date   CHOL 200 10/18/2019   HDL 44 10/18/2019   LDLCALC 129 10/18/2019   TRIG 137 10/18/2019    Significant Diagnostic Results in last 30 days:  No results found.  Assessment/Plan 1. Vascular dementia without behavioral disturbance (HCC) - agitation with ADLs - cont Aricept - cont skilled nursing  2. Morbid obesity (HCC) - BMI 41.28 - sedentary life due to age and chronic conditions - cont monthly weights  3. Primary hypertension - controlled - cont losartan and Bystolic  4. AV block - s/p pacemaker   5. Chronic diastolic heart failure (HCC) - compensated - cont furosemide  6. Venous insufficiency - cont ted hose and leg elevation  7. Obstructive sleep apnea on CPAP - cont CPAP QHS  8. Depression, major, recurrent, in partial remission (HCC) - cont Cymbalta  9. Acquired hypothyroidism - cont levothyroxine  10. B12 deficiency - cont B12  11. Breast lump in female - no further workup    Family/ staff Communication: plan discussed with patient and nurse  Labs/tests ordered:  none

## 2022-05-03 ENCOUNTER — Encounter: Payer: Self-pay | Admitting: Internal Medicine

## 2022-05-03 ENCOUNTER — Non-Acute Institutional Stay (SKILLED_NURSING_FACILITY): Payer: Medicare Other | Admitting: Internal Medicine

## 2022-05-03 DIAGNOSIS — E538 Deficiency of other specified B group vitamins: Secondary | ICD-10-CM | POA: Diagnosis not present

## 2022-05-03 DIAGNOSIS — F3341 Major depressive disorder, recurrent, in partial remission: Secondary | ICD-10-CM | POA: Diagnosis not present

## 2022-05-03 DIAGNOSIS — N63 Unspecified lump in unspecified breast: Secondary | ICD-10-CM | POA: Diagnosis not present

## 2022-05-03 DIAGNOSIS — I10 Essential (primary) hypertension: Secondary | ICD-10-CM

## 2022-05-03 DIAGNOSIS — I5032 Chronic diastolic (congestive) heart failure: Secondary | ICD-10-CM | POA: Diagnosis not present

## 2022-05-03 DIAGNOSIS — G4733 Obstructive sleep apnea (adult) (pediatric): Secondary | ICD-10-CM

## 2022-05-03 DIAGNOSIS — E039 Hypothyroidism, unspecified: Secondary | ICD-10-CM

## 2022-05-03 DIAGNOSIS — F0150 Vascular dementia without behavioral disturbance: Secondary | ICD-10-CM

## 2022-05-03 NOTE — Progress Notes (Signed)
Location:  [facility] Room Number: [place] of Service:  SNF [phone]) Provider:  Virgie Dad, MD   Virgie Dad, MD  Patient Care Team: Virgie Dad, MD as PCP - General (Internal Medicine) Sueanne Margarita, MD as PCP - Sleep Medicine (Sleep Medicine) Evans Lance, MD as PCP - Cardiology (Cardiology) Justice Britain, MD as Consulting Physician (Orthopedic Surgery) Gaynelle Arabian, MD as Consulting Physician (Orthopedic Surgery) Garvin Fila, MD as Consulting Physician (Neurology) Evans Lance, MD as Consulting Physician (Cardiology) Jettie Booze, MD as Consulting Physician (Interventional Cardiology) Shon Hough, MD as Consulting Physician (Ophthalmology) Community, [facility] ([place]) Royal Hawthorn, NP as Nurse Practitioner (Nurse Practitioner)  Extended Emergency Contact Information Primary Emergency Contact: Ghattas,JED Address: [place], [place] of [place] Phone: [phone] Mobile Phone: [phone] Relation: Son Secondary Emergency Contact: [place] Mobile Phone: [phone] Relation: Daughter  Code Status:  DNR  Goals of care: Advanced Directive information    05/03/2022   11:30 AM  Advanced Directives  Does Patient Have a Medical Advance Directive? Yes  Type of Paramedic of [place];Out of facility DNR (pink MOST or yellow form)  Does patient want to make changes to medical advance directive? No - Patient declined  Copy of [place] in Chart? Yes - validated most recent copy scanned in chart (See row information)     Chief Complaint  Patient presents with   Medical Management of Chronic Issues    Routine visit   Quality Metric Gaps    Discussed the need for AWV    HPI:  Pt is a 88 y.o. female seen today for medical management of chronic diseases.    Lives in SNF in  [place]   Patient has h/o Chronic Diastolic CHF, Urinary Incontinence, HTN, Osteoporosis, HLD, B 12 Def, anemia, H/o  Breast cancer and Vascular Dementia  h/o SAH and OSA Has h/o Breast lump No Work up per her request    She is stable. No new Nursing issues. No Behavior issues Pleasantly confused. Stays in her Wheelchair Her weight is stable No Falls Wt Readings from Last 3 Encounters:  05/03/22 231 lb 9.6 oz (105.1 kg)  04/20/22 211 lb 6 oz (95.9 kg)  03/16/22 228 lb 9.6 oz (103.7 kg)      Past Medical History:  Diagnosis Date   Breast cancer ([place])    Colon polyps    Coronary artery disease 06/24/2006   STEMI inferior with BMS to mid RCA with mid basal inferior wall HK and luminal irrgularities elsewhere   Fracture of femoral neck, right ([place]) 06/13/2011   Fracture of humeral head, right, closed 06/13/2011   GERD (gastroesophageal reflux disease)    Hemorrhoids    Hyperlipemia    Hypertension    LBBB (left bundle branch block)    Macular degeneration    Myocardial infarction ([place])    Obstructive sleep apnea on CPAP    Osteopenia    Osteopenia    Pacemaker 02/25/2014   Rhinitis    Rotator cuff syndrome    SAH (subarachnoid hemorrhage) ([place]) 02/22/2014   Stokes Adams attack 02/22/2014   Ulnar neuropathy    Umbilical hernia    Unstable gait    Vascular dementia without behavioral disturbance ([place]) 07/04/2019   MMSE 10/11/19 28/30, 06/08/20 28/30   Wears hearing aid    Per [facility] records  Past Surgical History:  Procedure Laterality Date   BREAST LUMPECTOMY     CATARACT EXTRACTION, BILATERAL     Per [facility]   CORONARY ANGIOPLASTY WITH STENT PLACEMENT  06/2006   Mid RCA BMS   HIP ARTHROPLASTY  06/06/2011   Procedure: ARTHROPLASTY BIPOLAR HIP;  Surgeon: Gearlean Alf;  Location: [facility] ORS;  Service: Orthopedics;  Laterality: Right;   PERMANENT PACEMAKER INSERTION N/A 02/25/2014   Procedure: PERMANENT PACEMAKER INSERTION;  Surgeon: Evans Lance, MD;   Location: [facility] CATH LAB;  Service: Cardiovascular;  Laterality: N/A;   SHOULDER SURGERY     TONSILLECTOMY     Per [facility]    WRIST SURGERY      Allergies  Allergen Reactions   Chlor-Trimeton [Chlorpheniramine] Anaphylaxis   Codeine Anaphylaxis   Nystatin Rash    redness   Amlodipine     Per [facility]     Outpatient Encounter Medications as of 05/03/2022  Medication Sig   acetaminophen (TYLENOL) 500 MG tablet Take 500 mg by mouth every 8 (eight) hours as needed.    aspirin EC 81 MG tablet Take 81 mg by mouth daily.   Carboxymethylcellulose Sodium (ARTIFICIAL TEARS OP) Apply 1 drop to eye as needed.   Chlorhexidine Gluconate 4 % SOLN Apply 1 application topically daily. On Tuesday or Friday   cholecalciferol (VITAMIN D) 25 MCG (1000 UNIT) tablet Take 3,000 Units by mouth daily. In the morning between 8-11 am   donepezil (ARICEPT) 10 MG tablet Take 1 tablet (10 mg total) by mouth at bedtime.   DULoxetine (CYMBALTA) 60 MG capsule Take 60 mg by mouth daily.   furosemide (LASIX) 40 MG tablet Take 40 mg by mouth daily. And prn edema   furosemide (LASIX) 40 MG tablet Take 40 mg by mouth as needed for edema or fluid.   KETOCONAZOLE, TOPICAL, 1 % SHAM Apply 1 application  topically 2 (two) times a week. Leave on for 15 min then rinse.   levothyroxine (SYNTHROID, LEVOTHROID) 25 MCG tablet Take 25 mcg by mouth daily before breakfast.    losartan (COZAAR) 100 MG tablet Take 100 mg by mouth daily.   miconazole (MICOTIN) 2 % cream Apply 1 application topically as needed (Apply to rash under breasts and abdominal folds as needed).   mineral oil-hydrophilic petrolatum (AQUAPHOR) ointment Apply topically as needed for dry skin.   Multiple Vitamins-Minerals (PRESERVISION AREDS 2+MULTI VIT PO) Take by mouth 2 (two) times daily.   nebivolol (BYSTOLIC) 10 MG tablet Take 1 tablet (10 mg total) by mouth daily.   nitroGLYCERIN (NITROSTAT) 0.4 MG SL tablet Place 0.4 mg under  the tongue every 5 (five) minutes as needed.   potassium chloride SA (K-DUR,KLOR-CON) 20 MEQ tablet Take 20 mEq by mouth daily.    sodium fluoride (PREVIDENT 5000 PLUS) 1.1 % CREA dental cream Place 1 application  onto teeth at bedtime.   vitamin B-12 (CYANOCOBALAMIN) 1000 MCG tablet Take 1,000 mcg by mouth daily.   No facility-administered encounter medications on file as of 05/03/2022.    Review of Systems  Unable to perform ROS: Dementia    Immunization History  Administered Date(s) Administered   Influenza, High Dose Seasonal PF 02/18/2017, 03/22/2019, 03/14/2020   Influenza,inj,Quad PF,6+ Mos 03/24/2018   Influenza-Unspecified 03/04/2016, 03/18/2020, 02/25/2021, 02/26/2022   Moderna Covid-19 Vaccine Bivalent Booster 28yr & up 09/18/2021, 03/29/2022   Moderna SARS-COV2 Booster Vaccination 04/03/2020, 01/13/2021   Moderna Sars-Covid-2 Vaccination 06/04/2019, 07/03/2019, 03/04/2021   Pneumococcal Conjugate-13 05/08/2014  Pneumococcal Polysaccharide-23 10/08/2017   Td 07/09/2002, 01/16/2013   Zoster Recombinat (Shingrix) 11/18/2017, 01/17/2018   Zoster, Live 06/30/2010   Pertinent  Health Maintenance Due  Topic Date Due   INFLUENZA VACCINE  Completed   DEXA SCAN  Completed      12/21/2018   11:33 AM 07/04/2019    9:05 AM 01/15/2021    1:46 PM 08/18/2021   12:45 PM 04/20/2022    2:25 PM  Fall Risk  Falls in the past year? 0 0  1 0  Was there an injury with Fall? 0 0  0 0  Fall Risk Category Calculator 0 0  1 0  Fall Risk Category Low Low  Low Low  Patient Fall Risk Level  Low fall risk Moderate fall risk Moderate fall risk Moderate fall risk  Patient at Risk for Falls Due to Impaired mobility  History of fall(s) History of fall(s);Impaired balance/gait;Impaired mobility;Orthopedic patient No Fall Risks;History of fall(s);Impaired balance/gait;Orthopedic patient;Impaired mobility  Patient at Risk for Falls Due to - Comments    chronic knee pain Chronic knee pain  Fall risk  Follow up Falls evaluation completed;Falls prevention discussed  Falls evaluation completed;Follow up appointment Education provided;Falls prevention discussed Falls evaluation completed   Functional Status Survey:    Vitals:   05/03/22 1125  Resp: 16  Temp: (!) 97.4 F (36.3 C)  SpO2: 95%  Weight: 231 lb 9.6 oz (105.1 kg)  Height: 5' (1.524 m)   Body mass index is 45.23 kg/m. Physical Exam Vitals reviewed.  Constitutional:      Appearance: Normal appearance.  HENT:     Head: Normocephalic.     Nose: Nose normal.     Mouth/Throat:     Mouth: Mucous membranes are moist.     Pharynx: Oropharynx is clear.  Eyes:     Pupils: Pupils are equal, round, and reactive to light.  Cardiovascular:     Rate and Rhythm: Normal rate and regular rhythm.     Pulses: Normal pulses.     Heart sounds: Normal heart sounds. No murmur heard. Pulmonary:     Effort: Pulmonary effort is normal.     Breath sounds: Normal breath sounds.  Abdominal:     General: Abdomen is flat. Bowel sounds are normal.     Palpations: Abdomen is soft.  Musculoskeletal:        General: Swelling present.     Cervical back: Neck supple.  Skin:    General: Skin is warm.  Neurological:     General: No focal deficit present.     Mental Status: She is alert.  Psychiatric:        Mood and Affect: Mood normal.        Thought Content: Thought content normal.     Labs reviewed: Recent Labs    05/14/21 0000 10/19/21 0000 01/16/22 0000  NA 139 142 143  K 4.5 4.0 4.0  CL 100 104 105  CO2 27* 29* 27*  BUN '16 15 13  '$ CREATININE 1.0 1.0 0.9  CALCIUM 9.7 9.0 9.1   Recent Labs    05/14/21 0000 10/19/21 0000 01/16/22 0000  AST 18 14  --   ALT 11 7  --   ALKPHOS 113 104  --   ALBUMIN 3.9 3.5 3.3*   Recent Labs    05/14/21 0000 10/19/21 0000 11/12/21 0000 01/16/22 0000  WBC 8.4 7.3 8.1 7.6  NEUTROABS 4.70  --   --   --   HGB 12.9 11.2*  12.1 11.7*  HCT 39 34* 38 34*  PLT 244 223 213 230   Lab  Results  Component Value Date   TSH 4.37 10/19/2021   Lab Results  Component Value Date   HGBA1C 6.1 03/01/2019   Lab Results  Component Value Date   CHOL 200 10/18/2019   HDL 44 10/18/2019   LDLCALC 129 10/18/2019   TRIG 137 10/18/2019    Significant Diagnostic Results in last 30 days:  No results found.  Assessment/Plan 1. Primary hypertension Cozaar and Nebvolol  2. Vascular dementia without behavioral disturbance (HCC) Complete ADL care Does feed herself Some behavior issues with ADLS On Aricept 3. Chronic diastolic heart failure (HCC) On Lasix  4. Obstructive sleep apnea on CPAP   5. Depression, major, recurrent, in partial remission (HCC) Cymbalta  6. Acquired hypothyroidism TSH normal in 5/23  7. B12 deficiency Supplement  8. Breast lump in female No Work up per [facility] staff Communication:   Labs/tests ordered:

## 2022-05-04 ENCOUNTER — Encounter: Payer: Self-pay | Admitting: Internal Medicine

## 2022-05-11 ENCOUNTER — Ambulatory Visit (INDEPENDENT_AMBULATORY_CARE_PROVIDER_SITE_OTHER): Payer: Medicare Other

## 2022-05-11 DIAGNOSIS — I4430 Unspecified atrioventricular block: Secondary | ICD-10-CM

## 2022-05-12 DIAGNOSIS — L570 Actinic keratosis: Secondary | ICD-10-CM | POA: Diagnosis not present

## 2022-05-12 DIAGNOSIS — L853 Xerosis cutis: Secondary | ICD-10-CM | POA: Diagnosis not present

## 2022-05-12 DIAGNOSIS — I872 Venous insufficiency (chronic) (peripheral): Secondary | ICD-10-CM | POA: Diagnosis not present

## 2022-05-12 DIAGNOSIS — R229 Localized swelling, mass and lump, unspecified: Secondary | ICD-10-CM | POA: Diagnosis not present

## 2022-05-14 LAB — CUP PACEART REMOTE DEVICE CHECK
Battery Remaining Longevity: 23 mo
Battery Remaining Percentage: 19 %
Battery Voltage: 2.83 V
Brady Statistic AP VP Percent: 67 %
Brady Statistic AP VS Percent: 1.1 %
Brady Statistic AS VP Percent: 28 %
Brady Statistic AS VS Percent: 2.9 %
Brady Statistic RA Percent Paced: 67 %
Brady Statistic RV Percent Paced: 95 %
Date Time Interrogation Session: 20231222015012
Implantable Lead Connection Status: 753985
Implantable Lead Connection Status: 753985
Implantable Lead Implant Date: 20151005
Implantable Lead Implant Date: 20151005
Implantable Lead Location: 753859
Implantable Lead Location: 753860
Implantable Pulse Generator Implant Date: 20151005
Lead Channel Impedance Value: 460 Ohm
Lead Channel Impedance Value: 580 Ohm
Lead Channel Pacing Threshold Amplitude: 0.75 V
Lead Channel Pacing Threshold Amplitude: 1.375 V
Lead Channel Pacing Threshold Pulse Width: 0.5 ms
Lead Channel Pacing Threshold Pulse Width: 0.5 ms
Lead Channel Sensing Intrinsic Amplitude: 12 mV
Lead Channel Sensing Intrinsic Amplitude: 5 mV
Lead Channel Setting Pacing Amplitude: 1 V
Lead Channel Setting Pacing Amplitude: 2.375
Lead Channel Setting Pacing Pulse Width: 0.5 ms
Lead Channel Setting Sensing Sensitivity: 4 mV
Pulse Gen Model: 2240
Pulse Gen Serial Number: 7666288

## 2022-06-07 NOTE — Progress Notes (Signed)
Remote pacemaker transmission.   

## 2022-06-10 ENCOUNTER — Non-Acute Institutional Stay (SKILLED_NURSING_FACILITY): Payer: Medicare Other | Admitting: Adult Health

## 2022-06-10 ENCOUNTER — Encounter: Payer: Self-pay | Admitting: Adult Health

## 2022-06-10 DIAGNOSIS — I5032 Chronic diastolic (congestive) heart failure: Secondary | ICD-10-CM

## 2022-06-10 DIAGNOSIS — I10 Essential (primary) hypertension: Secondary | ICD-10-CM

## 2022-06-10 DIAGNOSIS — Z853 Personal history of malignant neoplasm of breast: Secondary | ICD-10-CM

## 2022-06-10 DIAGNOSIS — E039 Hypothyroidism, unspecified: Secondary | ICD-10-CM

## 2022-06-10 DIAGNOSIS — F0150 Vascular dementia without behavioral disturbance: Secondary | ICD-10-CM

## 2022-06-10 DIAGNOSIS — Z950 Presence of cardiac pacemaker: Secondary | ICD-10-CM

## 2022-06-10 DIAGNOSIS — H1013 Acute atopic conjunctivitis, bilateral: Secondary | ICD-10-CM

## 2022-06-10 DIAGNOSIS — I872 Venous insufficiency (chronic) (peripheral): Secondary | ICD-10-CM

## 2022-06-10 DIAGNOSIS — G4733 Obstructive sleep apnea (adult) (pediatric): Secondary | ICD-10-CM

## 2022-06-10 MED ORDER — LORATADINE 10 MG PO TABS: 10.0000 mg | ORAL_TABLET | Freq: Every day | ORAL | 0 refills | Status: DC

## 2022-06-10 NOTE — Progress Notes (Signed)
Location:   [facility] Room Number: [place] of Service:  SNF [phone]) Provider:  Royal Hawthorn, NP  Virgie Dad, MD  [name] Care Team: Virgie Dad, MD as PCP - General (Internal Medicine) Sueanne Margarita, MD as PCP - Sleep Medicine (Sleep Medicine) Evans Lance, MD as PCP - Cardiology (Cardiology) Justice Britain, MD as Consulting Physician (Orthopedic Surgery) Gaynelle Arabian, MD as Consulting Physician (Orthopedic Surgery) Garvin Fila, MD as Consulting Physician (Neurology) Evans Lance, MD as Consulting Physician (Cardiology) Jettie Booze, MD as Consulting Physician (Interventional Cardiology) Shon Hough, MD as Consulting Physician (Ophthalmology) Community, [facility] ([place]) Royal Hawthorn, NP as Nurse Practitioner (Nurse Practitioner)  Extended Emergency Contact Information Primary Emergency Contact: Mcpherson,JED Address: [place], [place] Phone: [phone] Mobile Phone: [phone] Relation: Son Secondary Emergency Contact: [employer] Mobile Phone: [phone] Relation: Daughter  Code Status:  DNR Goals of care: Advanced Directive information    05/03/2022   11:30 AM  Advanced Directives  Does [name] Have a Medical Advance Directive? Yes  Type of Paramedic of [place];Out of facility DNR (pink MOST or yellow form)  Does [name] want to make changes to medical advance directive? No - [name] declined  Copy of [place] in Chart? Yes - validated most recent copy scanned in chart (See row information)     Chief Complaint  [name] presents with   Medical Management of Chronic Issues    Routine follow up   Immunizations    COVID booster due   Quality Metric Gaps    Medicare annual wellness due.    HPI:  Pt is a 89 y.o. female seen today for medical management of  chronic diseases.   PMH significant for breast ca, CAD with stent, subarachnoid hemorrhage, dementia, incontinence, obesity, OP, HTN, diastolic CHF, GERD, pacemaker, among others. Has a hx of breast lump as well that her family declined mammogram for due to her age and dementia.   She resides in skilled care with dementia. Needs help with ADLS but she is able to feed herself , communicate her needs, and participate in activities. At times she tries to avoid personal care or can become snappy verbally.   Continues on cpap. Sleeps more during the day over the past year. Denies any difficulty sleeping  BMI 45.7 Wt Readings from Last 3 Encounters:  06/10/22 234 lb (106.1 kg)  05/03/22 231 lb 9.6 oz (105.1 kg)  04/20/22 211 lb 6 oz (95.9 kg)   MMSE 28/30 03/02/22.  Reports dry itchy eyes.    Past Medical History:  Diagnosis Date   Breast cancer ([place])    Colon polyps    Coronary artery disease 06/24/2006   STEMI inferior with BMS to mid RCA with mid basal inferior wall HK and luminal irrgularities elsewhere   Fracture of femoral neck, right ([place]) 06/13/2011   Fracture of humeral head, right, closed 06/13/2011   GERD (gastroesophageal reflux disease)    Hemorrhoids    Hyperlipemia    Hypertension    LBBB (left bundle branch block)    Macular degeneration    Myocardial infarction ([place])    Obstructive sleep apnea on CPAP    Osteopenia    Osteopenia    Pacemaker 02/25/2014   Rhinitis    Rotator cuff syndrome    SAH (subarachnoid hemorrhage) ([place]) 02/22/2014  [place] attack 02/22/2014   Ulnar neuropathy    Umbilical hernia    Unstable gait    Vascular dementia without behavioral disturbance ([place]) 07/04/2019   MMSE 10/11/19 28/30, 06/08/20 28/30   Wears hearing aid    Per [facility] records   Past Surgical History:  Procedure Laterality Date   BREAST LUMPECTOMY     CATARACT EXTRACTION, BILATERAL     Per [facility]   CORONARY ANGIOPLASTY WITH STENT PLACEMENT   06/2006   Mid RCA BMS   HIP ARTHROPLASTY  06/06/2011   Procedure: ARTHROPLASTY BIPOLAR HIP;  Surgeon: Gearlean Alf;  Location: [facility] ORS;  Service: Orthopedics;  Laterality: Right;   PERMANENT PACEMAKER INSERTION N/A 02/25/2014   Procedure: PERMANENT PACEMAKER INSERTION;  Surgeon: Evans Lance, MD;  Location: [facility] CATH LAB;  Service: Cardiovascular;  Laterality: N/A;   SHOULDER SURGERY     TONSILLECTOMY     Per [facility]    WRIST SURGERY      Allergies  Allergen Reactions   Chlor-Trimeton [Chlorpheniramine] Anaphylaxis   Codeine Anaphylaxis   Nystatin Rash    redness   Amlodipine     Per [facility]     Allergies as of 06/10/2022       Reactions   Chlor-trimeton [chlorpheniramine] Anaphylaxis   Codeine Anaphylaxis   Nystatin Rash   redness   Amlodipine    Per [facility]         Medication List        Accurate as of June 10, 2022  1:27 PM. If you have any questions, ask your nurse or doctor.          acetaminophen 500 MG tablet Commonly known as: TYLENOL Take 500 mg by mouth every 8 (eight) hours as needed.   ammonium lactate 12 % cream Commonly known as: AMLACTIN Apply 1 Application topically at bedtime.   ARTIFICIAL TEARS OP Apply 1 drop to eye as needed.   aspirin EC 81 MG tablet Take 81 mg by mouth daily.   Chlorhexidine Gluconate 4 % Soln Apply 1 application topically daily. On Tuesday or Friday   cholecalciferol 25 MCG (1000 UNIT) tablet Commonly known as: VITAMIN D3 Take 3,000 Units by mouth daily. In the morning between 8-11 am   cyanocobalamin 1000 MCG tablet Commonly known as: VITAMIN B12 Take 1,000 mcg by mouth daily.   donepezil 10 MG tablet Commonly known as: Aricept Take 1 tablet (10 mg total) by mouth at bedtime.   DULoxetine 60 MG capsule Commonly known as: CYMBALTA Take 60 mg by mouth daily.   furosemide 40 MG tablet Commonly known as: LASIX Take 40 mg by mouth daily. And prn  edema   furosemide 40 MG tablet Commonly known as: LASIX Take 40 mg by mouth as needed for edema or fluid.   KETOCONAZOLE (TOPICAL) 1 % Sham Apply 1 application  topically 2 (two) times a week. Leave on for 15 min then rinse.   levothyroxine 25 MCG tablet Commonly known as: SYNTHROID Take 25 mcg by mouth daily before breakfast.   losartan 100 MG tablet Commonly known as: COZAAR Take 100 mg by mouth daily.   miconazole 2 % cream Commonly known as: MICOTIN Apply 1 application topically as needed (Apply to rash under breasts and abdominal folds as needed).   mineral oil-hydrophilic petrolatum ointment Apply topically as needed for dry skin.   CeraVe Daily Moisturizing Lotn Apply topically daily.   nebivolol 10 MG tablet Commonly  known as: Bystolic Take 1 tablet (10 mg total) by mouth daily.   nitroGLYCERIN 0.4 MG SL tablet Commonly known as: NITROSTAT Place 0.4 mg under the tongue every 5 (five) minutes as needed.   potassium chloride SA 20 MEQ tablet Commonly known as: KLOR-CON M Take 20 mEq by mouth daily.   PRESERVISION AREDS 2+MULTI VIT PO Take by mouth 2 (two) times daily.   Sarna Sensitive 1 % Lotn Generic drug: pramoxine Apply topically. Daily and as needed   sodium fluoride 1.1 % Crea dental cream Commonly known as: PREVIDENT 5000 PLUS Place 1 application  onto teeth at bedtime.        Review of Systems  Constitutional:  Positive for fatigue (sleeping more). Negative for activity change, appetite change, chills, diaphoresis, fever and unexpected weight change.  HENT:  Negative for congestion.   Eyes:  Positive for redness and itching.  Respiratory:  Negative for cough, shortness of breath and wheezing.   Cardiovascular:  Positive for leg swelling. Negative for chest pain and palpitations.  Gastrointestinal:  Negative for abdominal distention, abdominal pain, constipation and diarrhea.  Genitourinary:  Negative for difficulty urinating and dysuria.   Musculoskeletal:  Positive for gait problem. Negative for arthralgias, back pain, joint swelling and myalgias.  Skin:        Dry skin with chronic erythema to legs  Neurological:  Negative for dizziness, tremors, seizures, syncope, facial asymmetry, speech difficulty, weakness, light-headedness, numbness and headaches.  Psychiatric/Behavioral:  Positive for behavioral problems and confusion. Negative for agitation.     Immunization History  Administered Date(s) Administered   Influenza, High Dose Seasonal PF 02/18/2017, 03/22/2019, 03/14/2020   Influenza,inj,Quad PF,6+ Mos 03/24/2018   Influenza-Unspecified 03/04/2016, 03/18/2020, 02/25/2021, 02/26/2022   Moderna Covid-19 Vaccine Bivalent Booster 23yr & up 09/18/2021, 03/29/2022   Moderna SARS-COV2 Booster Vaccination 04/03/2020, 01/13/2021   Moderna Sars-Covid-2 Vaccination 06/04/2019, 07/03/2019, 03/04/2021   Pneumococcal Conjugate-13 05/08/2014   Pneumococcal Polysaccharide-23 10/08/2017   Td 07/09/2002, 01/16/2013   Zoster Recombinat (Shingrix) 11/18/2017, 01/17/2018   Zoster, Live 06/30/2010   Pertinent  Health Maintenance Due  Topic Date Due   INFLUENZA VACCINE  Completed   DEXA SCAN  Completed      12/21/2018   11:33 AM 07/04/2019    9:05 AM 01/15/2021    1:46 PM 08/18/2021   12:45 PM 04/20/2022    2:25 PM  Fall Risk  Falls in the past year? 0 0  1 0  Was there an injury with Fall? 0 0  0 0  Fall Risk Category Calculator 0 0  1 0  Fall Risk Category (Retired) Low Low  Low Low  (RETIRED) [name] Fall Risk Level  Low fall risk Moderate fall risk Moderate fall risk Moderate fall risk  [name] at Risk for Falls Due to Impaired mobility  History of fall(s) History of fall(s);Impaired balance/gait;Impaired mobility;Orthopedic [name] No Fall Risks;History of fall(s);Impaired balance/gait;Orthopedic [name];Impaired mobility  [name] at Risk for Falls Due to - Comments    chronic knee pain Chronic knee pain  Fall risk Follow  up Falls evaluation completed;Falls prevention discussed  Falls evaluation completed;Follow up appointment Education provided;Falls prevention discussed Falls evaluation completed   Functional Status Survey:    Vitals:   06/10/22 1256  BP: (!) 141/74  Pulse: 65  Resp: 16  Temp: (!) 97.5 F (36.4 C)  SpO2: 93%  Weight: 234 lb (106.1 kg)  Height: 5' (1.524 m)   Body mass index is 45.7 kg/m. Physical Exam Vitals and nursing note  reviewed.  Constitutional:      General: She is not in acute distress.    Appearance: She is not diaphoretic.  HENT:     Head: Normocephalic and atraumatic.     Mouth/Throat:     Mouth: Mucous membranes are moist.     Pharynx: Oropharynx is clear.  Eyes:     Conjunctiva/sclera: Conjunctivae normal.     Pupils: Pupils are equal, round, and reactive to light.     Comments: Eyes lids upper and lower with scaly erythema. No drainage. No swelling.   Neck:     Vascular: No JVD.  Cardiovascular:     Rate and Rhythm: Normal rate and regular rhythm.     Heart sounds: No murmur heard. Pulmonary:     Effort: Pulmonary effort is normal. No respiratory distress.     Breath sounds: Normal breath sounds. No wheezing.  Abdominal:     General: Bowel sounds are normal. There is no distension.     Palpations: Abdomen is soft.     Tenderness: There is no abdominal tenderness.  Musculoskeletal:     Comments: BLE edema +1 with chronic statis dermatitis  Skin:    General: Skin is warm and dry.  Neurological:     General: No focal deficit present.     Mental Status: She is alert. Mental status is at baseline.  Psychiatric:        Mood and Affect: Mood normal.     Labs reviewed: Recent Labs    10/19/21 0000 01/16/22 0000  NA 142 143  K 4.0 4.0  CL 104 105  CO2 29* 27*  BUN 15 13  CREATININE 1.0 0.9  CALCIUM 9.0 9.1   Recent Labs    10/19/21 0000 01/16/22 0000  AST 14  --   ALT 7  --   ALKPHOS 104  --   ALBUMIN 3.5 3.3*   Recent Labs     10/19/21 0000 11/12/21 0000 01/16/22 0000  WBC 7.3 8.1 7.6  HGB 11.2* 12.1 11.7*  HCT 34* 38 34*  PLT 223 213 230   Lab Results  Component Value Date   TSH 4.37 10/19/2021   Lab Results  Component Value Date   HGBA1C 6.1 03/01/2019   Lab Results  Component Value Date   CHOL 200 10/18/2019   HDL 44 10/18/2019   LDLCALC 129 10/18/2019   TRIG 137 10/18/2019    Significant Diagnostic Results in last 30 days:  CUP PACEART REMOTE DEVICE CHECK  Result Date: 05/14/2022 Scheduled remote reviewed. Normal device function.  1 AMS, 4sec in duration, PAT Next remote 91 days. LA   Assessment/Plan  1. Venous insufficiency Continue lasix daily, elevation, compression hose  2. Primary hypertension Controlled Continue   3. Chronic diastolic heart failure (HCC) No current symptoms Has chronic leg edema on lasix.   4. Obstructive sleep apnea on CPAP Continues to use cpap  5. Vascular dementia without behavioral disturbance (HCC) Progressive decline in cognition and physical function c/w the disease. Continue supportive care in the skilled environment. Continue aricept   6. Acquired hypothyroidism Continue Synthroid   7. Pacemaker Had remote check Dec 2023  8. Personal history of breast cancer Currently with left breast lump NO symptoms POA Declined work up due to age/dementia which is appropriate.   9. Allergic conjunctivitis of both eyes  - loratadine (CLARITIN) 10 MG tablet; Take 1 tablet (10 mg total) by mouth daily for 14 days.  Dispense: 14 tablet; Refill: 0

## 2022-07-05 ENCOUNTER — Non-Acute Institutional Stay (SKILLED_NURSING_FACILITY): Payer: Medicare Other | Admitting: Adult Health

## 2022-07-05 ENCOUNTER — Encounter: Payer: Self-pay | Admitting: Adult Health

## 2022-07-05 DIAGNOSIS — F3341 Major depressive disorder, recurrent, in partial remission: Secondary | ICD-10-CM | POA: Diagnosis not present

## 2022-07-05 DIAGNOSIS — R5383 Other fatigue: Secondary | ICD-10-CM

## 2022-07-05 NOTE — Progress Notes (Signed)
Location:  [place] Room Number: 124A Place of Service:  SNF [phone]) Provider:  Royal Hawthorn, NP  Virgie Dad, MD  Patient Care Team: Virgie Dad, MD as PCP - General (Internal Medicine) Sueanne Margarita, MD as PCP - Sleep Medicine (Sleep Medicine) Evans Lance, MD as PCP - Cardiology (Cardiology) Justice Britain, MD as Consulting Physician (Orthopedic Surgery) Gaynelle Arabian, MD as Consulting Physician (Orthopedic Surgery) Garvin Fila, MD as Consulting Physician (Neurology) Evans Lance, MD as Consulting Physician (Cardiology) Jettie Booze, MD as Consulting Physician (Interventional Cardiology) Shon Hough, MD as Consulting Physician (Ophthalmology) Community, [facility] ([place]) Royal Hawthorn, NP as Nurse Practitioner (Nurse Practitioner)  Extended Emergency Contact Information Primary Emergency Contact: Kilburg,JED Address: [place], [place] Phone: [phone] Mobile Phone: [phone] Relation: Son Secondary Emergency Contact: Hayden Mobile Phone: [phone] Relation: Daughter  Code Status:  DNR Goals of care: Advanced Directive information    07/05/2022    9:33 AM  Advanced Directives  Does Patient Have a Medical Advance Directive? Yes  Type of Advance Directive Out of facility DNR (pink MOST or yellow form)  Does patient want to make changes to medical advance directive? No - Patient declined     Chief Complaint  Patient presents with   Acute Visit    Depression.     HPI:  Pt is a 89 y.o. female seen today for an acute visit for depression  Ms. Wassink resides in the skilled care area due to dementia and functional limitations. She is very pleasant, propels in a [place] is able to make her needs known.   The nurse wrote an SBAR reporting increased sleeping, fatigue, less intake, and feelings of depression  and grief associated with the loss of her husband.  For my visit she denies anxiety. Denies reduced appetite or difficulty sleeping. Also denies SI. Does think about death and states she has her obituary written from years ago. She reports feeling sad because she had been with her husband since she was 44 and it was a big life change.   She also thinks her husband's mother is still alive and feels bad that she has not spoken with him.   MMSE 03/02/22 28/30   She is on cymbalta for a hx of depression and arthritis. Was on zoloft at one time but this was changed due to a pharmacogenetic profile.      Past Medical History:  Diagnosis Date   Breast cancer ([place])    Colon polyps    Coronary artery disease 06/24/2006   STEMI inferior with BMS to mid RCA with mid basal inferior wall HK and luminal irrgularities elsewhere   Fracture of femoral neck, right ([place]) 06/13/2011   Fracture of humeral head, right, closed 06/13/2011   GERD (gastroesophageal reflux disease)    Hemorrhoids    Hyperlipemia    Hypertension    LBBB (left bundle branch block)    Macular degeneration    Myocardial infarction ([place])    Obstructive sleep apnea on CPAP    Osteopenia    Osteopenia    Pacemaker 02/25/2014   Rhinitis    Rotator cuff syndrome    SAH (subarachnoid hemorrhage) ([place]) 02/22/2014   Stokes Adams attack 02/22/2014   Ulnar neuropathy    Umbilical hernia    Unstable gait    Vascular dementia without behavioral disturbance ([place]) 07/04/2019  MMSE 10/11/19 28/30, 06/08/20 28/30   Wears hearing aid    Per [facility] records   Past Surgical History:  Procedure Laterality Date   BREAST LUMPECTOMY     CATARACT EXTRACTION, BILATERAL     Per [facility]   CORONARY ANGIOPLASTY WITH STENT PLACEMENT  06/2006   Mid RCA BMS   HIP ARTHROPLASTY  06/06/2011   Procedure: ARTHROPLASTY BIPOLAR HIP;  Surgeon: Gearlean Alf;  Location: [facility] ORS;  Service: Orthopedics;  Laterality: Right;   PERMANENT  PACEMAKER INSERTION N/A 02/25/2014   Procedure: PERMANENT PACEMAKER INSERTION;  Surgeon: Evans Lance, MD;  Location: [facility] CATH LAB;  Service: Cardiovascular;  Laterality: N/A;   SHOULDER SURGERY     TONSILLECTOMY     Per [facility]    WRIST SURGERY      Allergies  Allergen Reactions   Chlor-Trimeton [Chlorpheniramine] Anaphylaxis   Codeine Anaphylaxis   Nystatin Rash    redness   Amlodipine     Per [facility]     Outpatient Encounter Medications as of 07/05/2022  Medication Sig   acetaminophen (TYLENOL) 500 MG tablet Take 500 mg by mouth every 8 (eight) hours as needed.    ammonium lactate (AMLACTIN) 12 % cream Apply 1 Application topically at bedtime.   aspirin EC 81 MG tablet Take 81 mg by mouth daily.   Carboxymethylcellulose Sodium (ARTIFICIAL TEARS OP) Apply 1 drop to eye as needed.   Chlorhexidine Gluconate 4 % SOLN Apply 1 application topically daily. On Tuesday or Friday   cholecalciferol (VITAMIN D) 25 MCG (1000 UNIT) tablet Take 3,000 Units by mouth daily. In the morning between 8-11 am   donepezil (ARICEPT) 10 MG tablet Take 1 tablet (10 mg total) by mouth at bedtime.   DULoxetine (CYMBALTA) 60 MG capsule Take 60 mg by mouth daily.   Emollient (CERAVE DAILY MOISTURIZING) LOTN Apply topically daily.   furosemide (LASIX) 40 MG tablet Take 40 mg by mouth daily. And prn edema   furosemide (LASIX) 40 MG tablet Take 40 mg by mouth as needed for edema or fluid.   KETOCONAZOLE, TOPICAL, 1 % SHAM Apply 1 application  topically 2 (two) times a week. Leave on for 15 min then rinse.   levothyroxine (SYNTHROID, LEVOTHROID) 25 MCG tablet Take 25 mcg by mouth daily before breakfast.    losartan (COZAAR) 100 MG tablet Take 100 mg by mouth daily.   miconazole (MICOTIN) 2 % cream Apply 1 application topically as needed (Apply to rash under breasts and abdominal folds as needed).   mineral oil-hydrophilic petrolatum (AQUAPHOR) ointment Apply topically as needed  for dry skin.   Multiple Vitamins-Minerals (PRESERVISION AREDS 2+MULTI VIT PO) Take by mouth 2 (two) times daily.   nebivolol (BYSTOLIC) 10 MG tablet Take 1 tablet (10 mg total) by mouth daily.   nitroGLYCERIN (NITROSTAT) 0.4 MG SL tablet Place 0.4 mg under the tongue every 5 (five) minutes as needed.   potassium chloride SA (K-DUR,KLOR-CON) 20 MEQ tablet Take 20 mEq by mouth daily.    pramoxine (SARNA SENSITIVE) 1 % LOTN Apply topically. Daily and as needed   sodium fluoride (PREVIDENT 5000 PLUS) 1.1 % CREA dental cream Place 1 application  onto teeth at bedtime.   vitamin B-12 (CYANOCOBALAMIN) 1000 MCG tablet Take 1,000 mcg by mouth daily.   [DISCONTINUED] loratadine (CLARITIN) 10 MG tablet Take 1 tablet (10 mg total) by mouth daily for 14 days.   No facility-administered encounter medications on file as of 07/05/2022.  Review of Systems  Constitutional:  Positive for activity change, appetite change and fatigue. Negative for chills, diaphoresis, fever and unexpected weight change.  HENT:  Negative for congestion.   Respiratory:  Negative for cough, shortness of breath and wheezing.   Cardiovascular:  Positive for leg swelling. Negative for chest pain and palpitations.  Gastrointestinal:  Negative for abdominal distention, abdominal pain, constipation and diarrhea.  Genitourinary:  Negative for difficulty urinating and dysuria.  Musculoskeletal:  Positive for gait problem. Negative for arthralgias, back pain, joint swelling and myalgias.  Neurological:  Negative for dizziness, tremors, seizures, syncope, facial asymmetry, speech difficulty, weakness, light-headedness, numbness and headaches.  Psychiatric/Behavioral:  Positive for confusion and dysphoric mood. Negative for agitation, behavioral problems and sleep disturbance. The patient is not nervous/anxious.     Immunization History  Administered Date(s) Administered   Influenza, High Dose Seasonal PF 02/18/2017, 03/22/2019,  03/14/2020   Influenza,inj,Quad PF,6+ Mos 03/24/2018   Influenza-Unspecified 03/04/2016, 03/18/2020, 02/25/2021, 02/26/2022   Moderna Covid-19 Vaccine Bivalent Booster 67yr & up 09/18/2021, 03/29/2022   Moderna SARS-COV2 Booster Vaccination 04/03/2020, 01/13/2021   Moderna Sars-Covid-2 Vaccination 06/04/2019, 07/03/2019, 03/04/2021   Pneumococcal Conjugate-13 05/08/2014   Pneumococcal Polysaccharide-23 10/08/2017   Td 07/09/2002, 01/16/2013   Zoster Recombinat (Shingrix) 11/18/2017, 01/17/2018   Zoster, Live 06/30/2010   Pertinent  Health Maintenance Due  Topic Date Due   INFLUENZA VACCINE  Completed   DEXA SCAN  Completed      12/21/2018   11:33 AM 07/04/2019    9:05 AM 01/15/2021    1:46 PM 08/18/2021   12:45 PM 04/20/2022    2:25 PM  Fall Risk  Falls in the past year? 0 0  1 0  Was there an injury with Fall? 0 0  0 0  Fall Risk Category Calculator 0 0  1 0  Fall Risk Category (Retired) Low Low  Low Low  (RETIRED) Patient Fall Risk Level  Low fall risk Moderate fall risk Moderate fall risk Moderate fall risk  Patient at Risk for Falls Due to Impaired mobility  History of fall(s) History of fall(s);Impaired balance/gait;Impaired mobility;Orthopedic patient No Fall Risks;History of fall(s);Impaired balance/gait;Orthopedic patient;Impaired mobility  Patient at Risk for Falls Due to - Comments    chronic knee pain Chronic knee pain  Fall risk Follow up Falls evaluation completed;Falls prevention discussed  Falls evaluation completed;Follow up appointment Education provided;Falls prevention discussed Falls evaluation completed   Functional Status Survey:    Vitals:   07/05/22 0928  BP: 130/72  Pulse: 63  Resp: 18  Temp: (!) 97.3 F (36.3 C)  SpO2: 96%  Weight: 232 lb 12.8 oz (105.6 kg)  Height: 5' (1.524 m)   Body mass index is 45.47 kg/m. Physical Exam Vitals and nursing note reviewed.  Constitutional:      General: She is not in acute distress.    Appearance: She is  not diaphoretic.  HENT:     Head: Normocephalic and atraumatic.  Neck:     Vascular: No JVD.  Cardiovascular:     Rate and Rhythm: Normal rate and regular rhythm.     Heart sounds: No murmur heard. Pulmonary:     Effort: Pulmonary effort is normal. No respiratory distress.     Breath sounds: Normal breath sounds. No wheezing.  Musculoskeletal:     Comments: BLE edema +2  with stasis dermatitis  Skin:    General: Skin is warm and dry.  Neurological:     General: No focal deficit present.  Mental Status: She is alert. Mental status is at baseline.  Psychiatric:        Mood and Affect: Mood normal.     Comments: Very pleasant and smiling today      Labs reviewed: Recent Labs    10/19/21 0000 01/16/22 0000  NA 142 143  K 4.0 4.0  CL 104 105  CO2 29* 27*  BUN 15 13  CREATININE 1.0 0.9  CALCIUM 9.0 9.1   Recent Labs    10/19/21 0000 01/16/22 0000  AST 14  --   ALT 7  --   ALKPHOS 104  --   ALBUMIN 3.5 3.3*   Recent Labs    10/19/21 0000 11/12/21 0000 01/16/22 0000  WBC 7.3 8.1 7.6  HGB 11.2* 12.1 11.7*  HCT 34* 38 34*  PLT 223 213 230   Lab Results  Component Value Date   TSH 4.37 10/19/2021   Lab Results  Component Value Date   HGBA1C 6.1 03/01/2019   Lab Results  Component Value Date   CHOL 200 10/18/2019   HDL 44 10/18/2019   LDLCALC 129 10/18/2019   TRIG 137 10/18/2019    Significant Diagnostic Results in last 30 days:  No results found.  Assessment/Plan  1. Depression, major, recurrent, in partial remission ([place]) Ongoing feelings of grief after the loss of her husband but she has periods of happiness, participates in activities, very pleasant Consider adding wellbutrin if labs are normal and this continues at the next visit.   2. Other fatigue Possibly due to progressing dementia and depression Will order labs Wears cpap nightly     Labs/tests ordered:  CBC BMP TSH in am

## 2022-07-06 DIAGNOSIS — R5383 Other fatigue: Secondary | ICD-10-CM | POA: Diagnosis not present

## 2022-07-06 LAB — COMPREHENSIVE METABOLIC PANEL
Calcium: 9.1 (ref 8.7–10.7)
eGFR: 51

## 2022-07-06 LAB — CBC AND DIFFERENTIAL
HCT: 37 (ref 36–46)
Hemoglobin: 11.6 — AB (ref 12.0–16.0)
Platelets: 225 10*3/uL (ref 150–400)
WBC: 8.8

## 2022-07-06 LAB — BASIC METABOLIC PANEL
BUN: 16 (ref 4–21)
CO2: 22 (ref 13–22)
Chloride: 99 (ref 99–108)
Creatinine: 1 (ref 0.5–1.1)
Glucose: 139
Potassium: 4.1 mEq/L (ref 3.5–5.1)
Sodium: 138 (ref 137–147)

## 2022-07-06 LAB — CBC: RBC: 3.86 — AB (ref 3.87–5.11)

## 2022-07-06 LAB — TSH: TSH: 1.77 (ref 0.41–5.90)

## 2022-07-07 ENCOUNTER — Non-Acute Institutional Stay (SKILLED_NURSING_FACILITY): Payer: Medicare Other | Admitting: Orthopedic Surgery

## 2022-07-07 ENCOUNTER — Encounter: Payer: Self-pay | Admitting: Orthopedic Surgery

## 2022-07-07 DIAGNOSIS — F0150 Vascular dementia without behavioral disturbance: Secondary | ICD-10-CM

## 2022-07-07 DIAGNOSIS — I509 Heart failure, unspecified: Secondary | ICD-10-CM

## 2022-07-07 DIAGNOSIS — R059 Cough, unspecified: Secondary | ICD-10-CM | POA: Diagnosis not present

## 2022-07-07 MED ORDER — FUROSEMIDE 20 MG PO TABS: 20.0000 mg | ORAL_TABLET | Freq: Every day | ORAL | 3 refills | Status: DC

## 2022-07-07 NOTE — Progress Notes (Signed)
Location:  [place] Room Number: 124/A Place of Service:  SNF (31) Provider:  Yvonna Alanis, NP   Patient Care Team: Virgie Dad, MD as PCP - General (Internal Medicine) Sueanne Margarita, MD as PCP - Sleep Medicine (Sleep Medicine) Evans Lance, MD as PCP - Cardiology (Cardiology) Justice Britain, MD as Consulting Physician (Orthopedic Surgery) Gaynelle Arabian, MD as Consulting Physician (Orthopedic Surgery) Garvin Fila, MD as Consulting Physician (Neurology) Evans Lance, MD as Consulting Physician (Cardiology) Jettie Booze, MD as Consulting Physician (Interventional Cardiology) Shon Hough, MD as Consulting Physician (Ophthalmology) Community, [facility] ([place]) Royal Hawthorn, NP as Nurse Practitioner (Nurse Practitioner)  Extended Emergency Contact Information Primary Emergency Contact: K[place],JED Address: [place], [place] Phone: [phone] Mobile Phone: [phone] Relation: Son Secondary Emergency Contact: Roy Mobile Phone: [phone] Relation: Daughter  Code Status:  DNR Goals of care: Advanced Directive information    07/05/2022    9:33 AM  Advanced Directives  Does Patient Have a Medical Advance Directive? Yes  Type of Advance Directive Out of facility DNR (pink MOST or yellow form)  Does patient want to make changes to medical advance directive? No - Patient declined     Chief Complaint  Patient presents with   Acute Visit    tachypnea    HPI:  Pt is a 89 y.o. female seen today for acute visit due to tachypnea.   She continues to reside on the skilled nursing unit at [facility] PMH: aortic insufficiency, diastolic heart failure, pacemaker, CAD, HTN, MI, OSA on CPAP, GERD, hypothyroidism, obesity, h/o breast cancer and unstable gait.    Increased somnolence > 1 month. 02/12 nursing reported  increased sleepiness during the day. Stat labs: WBC 8.8, hgb 11.6, hct 36.7, platelets 225, BUN/creat 16.2/1.03, Na+ 138, K+ 4.1, and TSH 1.77. Last night she was noted with RR of 24, O2 sat 97% on room air. Stat CXR ordered> results reveal mild CHF/pulmonary edema versus interstitial pneumonitis. She is a poor historian due to dementia. Nursing has noticed a dry cough this morning. Her weight has fluctuated about 2-3 lbs within past month. She is currently taking furosemide 40 mg daily for chronic CHF. Afebrile. Vitals stable.   Vascular dementia- MMSE 28/30 02/2022, CT head 2019 noted moderate atrophy with moderate small vessel ischemic changes of white matter, agitation with ADLs, forgets husband has passed at times, ambulates in wheelchair, remains on donepezil    Past Medical History:  Diagnosis Date   Breast cancer ([place])    Colon polyps    Coronary artery disease 06/24/2006   STEMI inferior with BMS to mid RCA with mid basal inferior wall HK and luminal irrgularities elsewhere   Fracture of femoral neck, right ([place]) 06/13/2011   Fracture of humeral head, right, closed 06/13/2011   GERD (gastroesophageal reflux disease)    Hemorrhoids    Hyperlipemia    Hypertension    LBBB (left bundle branch block)    Macular degeneration    Myocardial infarction ([place])    Obstructive sleep apnea on CPAP    Osteopenia    Osteopenia    Pacemaker 02/25/2014   Rhinitis    Rotator cuff syndrome    SAH (subarachnoid hemorrhage) ([place]) 02/22/2014   Stokes Adams attack 02/22/2014   Ulnar neuropathy    Umbilical hernia    Unstable gait    Vascular dementia  without behavioral disturbance ([place]) 07/04/2019   MMSE 10/11/19 28/30, 06/08/20 28/30   Wears hearing aid    Per [facility] records   Past Surgical History:  Procedure Laterality Date   BREAST LUMPECTOMY     CATARACT EXTRACTION, BILATERAL     Per [facility]   CORONARY ANGIOPLASTY WITH STENT PLACEMENT  06/2006   Mid RCA BMS   HIP  ARTHROPLASTY  06/06/2011   Procedure: ARTHROPLASTY BIPOLAR HIP;  Surgeon: Gearlean Alf;  Location: [facility] ORS;  Service: Orthopedics;  Laterality: Right;   PERMANENT PACEMAKER INSERTION N/A 02/25/2014   Procedure: PERMANENT PACEMAKER INSERTION;  Surgeon: Evans Lance, MD;  Location: [facility] CATH LAB;  Service: Cardiovascular;  Laterality: N/A;   SHOULDER SURGERY     TONSILLECTOMY     Per [facility]    WRIST SURGERY      Allergies  Allergen Reactions   Chlor-Trimeton [Chlorpheniramine] Anaphylaxis   Codeine Anaphylaxis   Nystatin Rash    redness   Amlodipine     Per [facility]     Outpatient Encounter Medications as of 07/07/2022  Medication Sig   acetaminophen (TYLENOL) 500 MG tablet Take 500 mg by mouth every 8 (eight) hours as needed.    ammonium lactate (AMLACTIN) 12 % cream Apply 1 Application topically at bedtime.   aspirin EC 81 MG tablet Take 81 mg by mouth daily.   Carboxymethylcellulose Sodium (ARTIFICIAL TEARS OP) Apply 1 drop to eye as needed.   Chlorhexidine Gluconate 4 % SOLN Apply 1 application topically daily. On Tuesday or Friday   cholecalciferol (VITAMIN D) 25 MCG (1000 UNIT) tablet Take 3,000 Units by mouth daily. In the morning between 8-11 am   donepezil (ARICEPT) 10 MG tablet Take 1 tablet (10 mg total) by mouth at bedtime.   DULoxetine (CYMBALTA) 60 MG capsule Take 60 mg by mouth daily.   Emollient (CERAVE DAILY MOISTURIZING) LOTN Apply topically daily.   furosemide (LASIX) 40 MG tablet Take 40 mg by mouth daily. And prn edema   furosemide (LASIX) 40 MG tablet Take 40 mg by mouth as needed for edema or fluid.   KETOCONAZOLE, TOPICAL, 1 % SHAM Apply 1 application  topically 2 (two) times a week. Leave on for 15 min then rinse.   levothyroxine (SYNTHROID, LEVOTHROID) 25 MCG tablet Take 25 mcg by mouth daily before breakfast.    losartan (COZAAR) 100 MG tablet Take 100 mg by mouth daily.   miconazole (MICOTIN) 2 % cream Apply 1 application  topically as needed (Apply to rash under breasts and abdominal folds as needed).   mineral oil-hydrophilic petrolatum (AQUAPHOR) ointment Apply topically as needed for dry skin.   Multiple Vitamins-Minerals (PRESERVISION AREDS 2+MULTI VIT PO) Take by mouth 2 (two) times daily.   nebivolol (BYSTOLIC) 10 MG tablet Take 1 tablet (10 mg total) by mouth daily.   nitroGLYCERIN (NITROSTAT) 0.4 MG SL tablet Place 0.4 mg under the tongue every 5 (five) minutes as needed.   potassium chloride SA (K-DUR,KLOR-CON) 20 MEQ tablet Take 20 mEq by mouth daily.    pramoxine (SARNA SENSITIVE) 1 % LOTN Apply topically. Daily and as needed   sodium fluoride (PREVIDENT 5000 PLUS) 1.1 % CREA dental cream Place 1 application  onto teeth at bedtime.   vitamin B-12 (CYANOCOBALAMIN) 1000 MCG tablet Take 1,000 mcg by mouth daily.   No facility-administered encounter medications on file as of 07/07/2022.    Review of Systems  Unable to perform ROS: Dementia  Immunization History  Administered Date(s) Administered   Influenza, High Dose Seasonal PF 02/18/2017, 03/22/2019, 03/14/2020   Influenza,inj,Quad PF,6+ Mos 03/24/2018   Influenza-Unspecified 03/04/2016, 03/18/2020, 02/25/2021, 02/26/2022   Moderna Covid-19 Vaccine Bivalent Booster 77yr & up 09/18/2021, 03/29/2022   Moderna SARS-COV2 Booster Vaccination 04/03/2020, 01/13/2021   Moderna Sars-Covid-2 Vaccination 06/04/2019, 07/03/2019, 03/04/2021   Pneumococcal Conjugate-13 05/08/2014   Pneumococcal Polysaccharide-23 10/08/2017   Td 07/09/2002, 01/16/2013   Zoster Recombinat (Shingrix) 11/18/2017, 01/17/2018   Zoster, Live 06/30/2010   Pertinent  Health Maintenance Due  Topic Date Due   INFLUENZA VACCINE  Completed   DEXA SCAN  Completed      12/21/2018   11:33 AM 07/04/2019    9:05 AM 01/15/2021    1:46 PM 08/18/2021   12:45 PM 04/20/2022    2:25 PM  Fall Risk  Falls in the past year? 0 0  1 0  Was there an injury with Fall? 0 0  0 0  Fall Risk  Category Calculator 0 0  1 0  Fall Risk Category (Retired) Low Low  Low Low  (RETIRED) Patient Fall Risk Level  Low fall risk Moderate fall risk Moderate fall risk Moderate fall risk  Patient at Risk for Falls Due to Impaired mobility  History of fall(s) History of fall(s);Impaired balance/gait;Impaired mobility;Orthopedic patient No Fall Risks;History of fall(s);Impaired balance/gait;Orthopedic patient;Impaired mobility  Patient at Risk for Falls Due to - Comments    chronic knee pain Chronic knee pain  Fall risk Follow up Falls evaluation completed;Falls prevention discussed  Falls evaluation completed;Follow up appointment Education provided;Falls prevention discussed Falls evaluation completed   Functional Status Survey:    Vitals:   07/07/22 1011  BP: 121/73  Pulse: 61  Resp: (!) 24  Temp: 97.7 F (36.5 C)  SpO2: 96%  Weight: 232 lb 12.8 oz (105.6 kg)  Height: 5' (1.524 m)   Body mass index is 45.47 kg/m. Physical Exam Vitals reviewed.  Constitutional:      Appearance: She is obese.  HENT:     Head: Normocephalic.     Right Ear: There is no impacted cerumen.     Left Ear: There is no impacted cerumen.     Nose: Nose normal.     Mouth/Throat:     Mouth: Mucous membranes are moist.  Eyes:     General:        Right eye: No discharge.        Left eye: No discharge.  Cardiovascular:     Rate and Rhythm: Normal rate and regular rhythm.     Pulses: Normal pulses.     Heart sounds: Normal heart sounds.     Comments: Pacemaker left upper chest Pulmonary:     Effort: Pulmonary effort is normal. No respiratory distress.     Breath sounds: Examination of the right-middle field reveals rhonchi. Examination of the left-middle field reveals rhonchi. Rhonchi and rales present. No wheezing.  Abdominal:     General: Bowel sounds are normal.     Palpations: Abdomen is soft.  Musculoskeletal:     Cervical back: Neck supple.     Right lower leg: Edema present.     Left lower leg:  Edema present.     Comments: Non pitting  Skin:    General: Skin is warm.     Capillary Refill: Capillary refill takes less than 2 seconds.  Neurological:     General: No focal deficit present.     Mental Status: She is alert. Mental  status is at baseline.     Motor: Weakness present.     Gait: Gait abnormal.  Psychiatric:        Mood and Affect: Mood normal.        Behavior: Behavior normal.     Labs reviewed: Recent Labs    10/19/21 0000 01/16/22 0000  NA 142 143  K 4.0 4.0  CL 104 105  CO2 29* 27*  BUN 15 13  CREATININE 1.0 0.9  CALCIUM 9.0 9.1   Recent Labs    10/19/21 0000 01/16/22 0000  AST 14  --   ALT 7  --   ALKPHOS 104  --   ALBUMIN 3.5 3.3*   Recent Labs    10/19/21 0000 11/12/21 0000 01/16/22 0000  WBC 7.3 8.1 7.6  HGB 11.2* 12.1 11.7*  HCT 34* 38 34*  PLT 223 213 230   Lab Results  Component Value Date   TSH 4.37 10/19/2021   Lab Results  Component Value Date   HGBA1C 6.1 03/01/2019   Lab Results  Component Value Date   CHOL 200 10/18/2019   HDL 44 10/18/2019   LDLCALC 129 10/18/2019   TRIG 137 10/18/2019    Significant Diagnostic Results in last 30 days:  No results found.  Assessment/Plan 1. Acute on chronic congestive heart failure, unspecified heart failure type ([place]) - increased somnolence, cough x 1 day - CXR indicated mild CHF/pulmonary edema versus interstitial pneumonitis - crackles to middle lobes on exam, non pitting ankle edema - weight have fluctuated 2-3 lbs within past month - cont furosemide 40 mg po QAM - start extra furosemide 40 mg po QAM x 2 days- give 02/14 and 02/15 - start extra furosemide 20 mg po daily give in afternoon> start 02/17 - increase KCL to 20 mg po bid  2 days> give 02/14 and 02/15 - bmp in 1 week - change monthly weights to weekly - consider changing diuretic to torsemide if symptoms unresolved  2. Vascular dementia without behavioral disturbance ([place]) - sometimes has increased  confusion/ grieving late husband> forgets he has passed - ambulates with w/c  - appetite fair - remains on Cymbalta for depression> Wellbutrin recommended if depression worsens behaviors - cont Aricept     Family/ staff Communication: plan discussed with patient and nurse  Labs/tests ordered:  bmp in 1 week

## 2022-07-13 ENCOUNTER — Encounter: Payer: Self-pay | Admitting: Orthopedic Surgery

## 2022-07-13 ENCOUNTER — Non-Acute Institutional Stay (SKILLED_NURSING_FACILITY): Payer: Medicare Other | Admitting: Orthopedic Surgery

## 2022-07-13 DIAGNOSIS — N63 Unspecified lump in unspecified breast: Secondary | ICD-10-CM | POA: Diagnosis not present

## 2022-07-13 DIAGNOSIS — G4733 Obstructive sleep apnea (adult) (pediatric): Secondary | ICD-10-CM

## 2022-07-13 DIAGNOSIS — E538 Deficiency of other specified B group vitamins: Secondary | ICD-10-CM | POA: Diagnosis not present

## 2022-07-13 DIAGNOSIS — I509 Heart failure, unspecified: Secondary | ICD-10-CM | POA: Diagnosis not present

## 2022-07-13 DIAGNOSIS — I4430 Unspecified atrioventricular block: Secondary | ICD-10-CM | POA: Diagnosis not present

## 2022-07-13 DIAGNOSIS — I872 Venous insufficiency (chronic) (peripheral): Secondary | ICD-10-CM | POA: Diagnosis not present

## 2022-07-13 DIAGNOSIS — E039 Hypothyroidism, unspecified: Secondary | ICD-10-CM | POA: Diagnosis not present

## 2022-07-13 DIAGNOSIS — F3341 Major depressive disorder, recurrent, in partial remission: Secondary | ICD-10-CM | POA: Diagnosis not present

## 2022-07-13 DIAGNOSIS — F0150 Vascular dementia without behavioral disturbance: Secondary | ICD-10-CM

## 2022-07-13 DIAGNOSIS — I10 Essential (primary) hypertension: Secondary | ICD-10-CM

## 2022-07-13 NOTE — Progress Notes (Signed)
Location:   [facility] Room Number: [id number] Place of Service:  SNF [phone]) Provider:  Windell Moulding, NP  PCP: Virgie Dad, MD  Patient Care Team: Virgie Dad, MD as PCP - General (Internal Medicine) Sueanne Margarita, MD as PCP - Sleep Medicine (Sleep Medicine) Evans Lance, MD as PCP - Cardiology (Cardiology) Justice Britain, MD as Consulting Physician (Orthopedic Surgery) Gaynelle Arabian, MD as Consulting Physician (Orthopedic Surgery) Garvin Fila, MD as Consulting Physician (Neurology) Evans Lance, MD as Consulting Physician (Cardiology) Jettie Booze, MD as Consulting Physician (Interventional Cardiology) Shon Hough, MD as Consulting Physician (Ophthalmology) Community, [facility] ([place]) Royal Hawthorn, NP as Nurse Practitioner (Nurse Practitioner)  Extended Emergency Contact Information Primary Emergency Contact: Fontanez,JED Address: [place], [place] Phone: [phone] Mobile Phone: [phone] Relation: Son Secondary Emergency Contact: [place] Mobile Phone: [phone] Relation: Daughter  Code Status:  DNR Goals of care: Advanced Directive information    07/13/2022   11:10 AM  Advanced Directives  Does Patient Have a Medical Advance Directive? Yes  Type of Advance Directive Out of facility DNR (pink MOST or yellow form)  Does patient want to make changes to medical advance directive? No - Patient declined     Chief Complaint  Patient presents with   Medical Management of Chronic Issues    Routine Visit.   Health Maintenance    Discuss the need for AWV.   Immunizations    Discuss the need for [employer].    HPI:  Pt is a 89 y.o. female seen today for medical management of chronic diseases.    She continues to reside on the skilled nursing unit at [facility] PMH: aortic insufficiency, diastolic heart  failure, pacemaker, CAD, HTN, MI, OSA on CPAP, GERD, hypothyroidism, obesity, h/o breast cancer and unstable gait.   Acute CHF- 02/12 CXR revealed mild CHF/pulmonary edema vs interstitial pneumonitis, also noted to have increased RR with dry cough and weight fluctuation, 02/14 she was given furosemide 40 mg BID x 2 days, daily furosemide increased to 40 mg, now on weekly weights, breathing improved/ cough resolved per nursing, BUN/creat 16/1.0 07/06/2022 Vascular dementia- MMSE 28/30 02/2022, CT head 2019 noted moderate atrophy with moderate small vessel ischemic changes of white matter, agitation with ADLs, forgets husband has passed, ambulates in wheelchair, remains on donepezil   HTN- BUN/creat BUN/creat 16/1.0 07/06/2022, remains on losartan and Bystolic AV block- s/p pacemaker- checked 05/11/2022 Venous insufficiency- see above OSA- CPAP qhs Depression- no changes in mood, remains on Cymbalta, Na + 138 07/06/2022 Hypothyroidism- TSH 1.77 (02/13)> was 4.37 10/19/2021, remains on levothyroxine B12 deficiency- hgb 11.6 07/06/2022, remains on cyanocobalamin daily Left breast lump- no workup per patient request, denies pain today  Recent blood pressures:  02/15- 123/75  02/14- 121/73   02/13- 120/60  Recent weights:  02/15- 231.8 lbs  02/11- 232.8 lbs  02/01- 233.2 lbs       Past Medical History:  Diagnosis Date   Breast cancer ([place])    Colon polyps    Coronary artery disease 06/24/2006   STEMI inferior with BMS to mid RCA with mid basal inferior wall HK and luminal irrgularities elsewhere   Fracture of femoral neck, right ([place]) 06/13/2011   Fracture of humeral head, right, closed 06/13/2011   GERD (gastroesophageal reflux disease)    Hemorrhoids    Hyperlipemia    Hypertension  LBBB (left bundle branch block)    Macular degeneration    Myocardial infarction ([place])    Obstructive sleep apnea on CPAP    Osteopenia    Osteopenia    Pacemaker 02/25/2014   Rhinitis     Rotator cuff syndrome    SAH (subarachnoid hemorrhage) ([place]) 02/22/2014   [place] attack 02/22/2014   Ulnar neuropathy    Umbilical hernia    Unstable gait    Vascular dementia without behavioral disturbance ([place]) 07/04/2019   MMSE 10/11/19 28/30, 06/08/20 28/30   Wears hearing aid    Per [facility] records   Past Surgical History:  Procedure Laterality Date   BREAST LUMPECTOMY     CATARACT EXTRACTION, BILATERAL     Per [facility]   CORONARY ANGIOPLASTY WITH STENT PLACEMENT  06/2006   Mid RCA BMS   HIP ARTHROPLASTY  06/06/2011   Procedure: ARTHROPLASTY BIPOLAR HIP;  Surgeon: Gearlean Alf;  Location: [facility] ORS;  Service: Orthopedics;  Laterality: Right;   PERMANENT PACEMAKER INSERTION N/A 02/25/2014   Procedure: PERMANENT PACEMAKER INSERTION;  Surgeon: Evans Lance, MD;  Location: [facility] CATH LAB;  Service: Cardiovascular;  Laterality: N/A;   SHOULDER SURGERY     TONSILLECTOMY     Per [facility]    WRIST SURGERY      Allergies  Allergen Reactions   Chlor-Trimeton [Chlorpheniramine] Anaphylaxis   Codeine Anaphylaxis   Nystatin Rash    redness   Amlodipine     Per [facility]     Allergies as of 07/13/2022       Reactions   Chlor-trimeton [chlorpheniramine] Anaphylaxis   Codeine Anaphylaxis   Nystatin Rash   redness   Amlodipine    Per [facility]         Medication List        Accurate as of July 13, 2022 11:10 AM. If you have any questions, ask your nurse or doctor.          acetaminophen 500 MG tablet Commonly known as: TYLENOL Take 500 mg by mouth every 8 (eight) hours as needed.   ammonium lactate 12 % cream Commonly known as: AMLACTIN Apply 1 Application topically at bedtime.   ARTIFICIAL TEARS OP Apply 1 drop to eye as needed.   aspirin EC 81 MG tablet Take 81 mg by mouth daily.   Chlorhexidine Gluconate 4 % Soln Apply 1 application topically daily. On Tuesday or Friday    cholecalciferol 25 MCG (1000 UT) tablet Generic drug: Cholecalciferol Take 3,000 Units by mouth daily. In the morning between 8-11 am   cyanocobalamin 1000 MCG tablet Commonly known as: VITAMIN B12 Take 1,000 mcg by mouth daily.   donepezil 10 MG tablet Commonly known as: Aricept Take 1 tablet (10 mg total) by mouth at bedtime.   DULoxetine 60 MG capsule Commonly known as: CYMBALTA Take 60 mg by mouth daily.   furosemide 40 MG tablet Commonly known as: LASIX Take 40 mg by mouth every morning. And prn edema What changed: Another medication with the same name was removed. Continue taking this medication, and follow the directions you see here. Changed by: Yvonna Alanis, NP   furosemide 40 MG tablet Commonly known as: LASIX Take 40 mg by mouth as needed for edema or fluid. What changed: Another medication with the same name was removed. Continue taking this medication, and follow the directions you see here. Changed by: Yvonna Alanis, NP   furosemide 20 MG  tablet Commonly known as: LASIX Take 20 mg by mouth every morning. What changed: Another medication with the same name was removed. Continue taking this medication, and follow the directions you see here. Changed by: Yvonna Alanis, NP   KETOCONAZOLE (TOPICAL) 1 % Sham Apply 1 application  topically 2 (two) times a week. Leave on for 15 min then rinse.   levothyroxine 25 MCG tablet Commonly known as: SYNTHROID Take 25 mcg by mouth daily before breakfast.   losartan 100 MG tablet Commonly known as: COZAAR Take 100 mg by mouth daily.   miconazole 2 % cream Commonly known as: MICOTIN Apply 1 application topically as needed (Apply to rash under breasts and abdominal folds as needed).   mineral oil-hydrophilic petrolatum ointment Apply topically as needed for dry skin.   CeraVe Daily Moisturizing Lotn Apply topically daily.   nebivolol 10 MG tablet Commonly known as: Bystolic Take 1 tablet (10 mg total) by mouth daily.    nitroGLYCERIN 0.4 MG SL tablet Commonly known as: NITROSTAT Place 0.4 mg under the tongue every 5 (five) minutes as needed.   ondansetron 4 MG tablet Commonly known as: ZOFRAN Take 4 mg by mouth every 6 (six) hours as needed for nausea or vomiting.   potassium chloride SA 20 MEQ tablet Commonly known as: KLOR-CON M Take 20 mEq by mouth daily.   PRESERVISION AREDS 2+MULTI VIT PO Take by mouth 2 (two) times daily.   Sarna Sensitive 1 % Lotn Generic drug: pramoxine Apply topically. Daily and as needed   Sarna Sensitive 1 % Lotn Generic drug: pramoxine Apply 1 Application topically every evening.   sodium fluoride 1.1 % Crea dental cream Commonly known as: PREVIDENT 5000 PLUS Place 1 application  onto teeth at bedtime.        Review of Systems  Unable to perform ROS: Dementia    Immunization History  Administered Date(s) Administered   Influenza, High Dose Seasonal PF 02/18/2017, 03/22/2019, 03/14/2020   Influenza,inj,Quad PF,6+ Mos 03/24/2018   Influenza-Unspecified 03/04/2016, 03/18/2020, 02/25/2021, 02/26/2022   Moderna Covid-19 Vaccine Bivalent Booster 33yr & up 09/18/2021, 03/29/2022   Moderna SARS-COV2 Booster Vaccination 04/03/2020, 01/13/2021   Moderna Sars-Covid-2 Vaccination 06/04/2019, 07/03/2019, 03/04/2021   Pneumococcal Conjugate-13 05/08/2014   Pneumococcal Polysaccharide-23 10/08/2017   Td 07/09/2002, 01/16/2013   Zoster Recombinat (Shingrix) 11/18/2017, 01/17/2018   Zoster, Live 06/30/2010   Pertinent  Health Maintenance Due  Topic Date Due   INFLUENZA VACCINE  Completed   DEXA SCAN  Completed      07/04/2019    9:05 AM 01/15/2021    1:46 PM 08/18/2021   12:45 PM 04/20/2022    2:25 PM 07/07/2022   10:36 AM  [place]in the past year? 0  1 0 0  Was there an injury with Fall? 0  0 0 0  Fall Risk Category Calculator 0  1 0 0  Fall Risk Category (Retired) Low  Low Low   (RETIRED) Patient Fall Risk Level Low fall risk Moderate fall risk  Moderate fall risk Moderate fall risk   Patient at Risk for Falls Due to  History of fall(s) History of fall(s);Impaired balance/gait;Impaired mobility;Orthopedic patient No Fall Risks;History of fall(s);Impaired balance/gait;Orthopedic patient;Impaired mobility History of fall(s)  Patient at Risk for Falls Due to - Comments   chronic knee pain Chronic knee pain   Fall risk Follow up  Falls evaluation completed;Follow up appointment Education provided;Falls prevention discussed Falls evaluation completed Falls evaluation completed   Functional Status Survey:  Vitals:   07/13/22 1101  BP: 123/75  Pulse: 65  Resp: (!) 23  Temp: 97.7 F (36.5 C)  SpO2: 95%  Weight: 231 lb 12.8 oz (105.1 kg)  Height: 5' (1.524 m)   Body mass index is 45.27 kg/m. Physical Exam Vitals reviewed.  Constitutional:      Appearance: She is obese.  HENT:     Head: Normocephalic.     Right Ear: There is no impacted cerumen.     Left Ear: There is no impacted cerumen.     Nose: Nose normal.     Mouth/Throat:     Mouth: Mucous membranes are moist.  Eyes:     General:        Right eye: No discharge.        Left eye: No discharge.  Cardiovascular:     Rate and Rhythm: Normal rate and regular rhythm.     Pulses: Normal pulses.     Heart sounds: Normal heart sounds.  Pulmonary:     Effort: Pulmonary effort is normal. No respiratory distress.     Breath sounds: No wheezing or rales.  Abdominal:     General: Bowel sounds are normal. There is no distension.     Palpations: Abdomen is soft.     Tenderness: There is no abdominal tenderness.  Musculoskeletal:     Cervical back: Neck supple.     Right lower leg: Edema present.     Left lower leg: Edema present.     Comments: Non pitting, ted hose on  Skin:    General: Skin is warm and dry.     Capillary Refill: Capillary refill takes less than 2 seconds.  Neurological:     General: No focal deficit present.     Mental Status: She is alert. Mental  status is at baseline.     Motor: Weakness present.     Gait: Gait abnormal.     Comments: wheelchair  Psychiatric:        Mood and Affect: Mood normal.        Behavior: Behavior normal.     Labs reviewed: Recent Labs    10/19/21 0000 01/16/22 0000 07/06/22 0000  NA 142 143 138  K 4.0 4.0 4.1  CL 104 105 99  CO2 29* 27* 22  BUN 15 13 16  $ CREATININE 1.0 0.9 1.0  CALCIUM 9.0 9.1 9.1   Recent Labs    10/19/21 0000 01/16/22 0000  AST 14  --   ALT 7  --   ALKPHOS 104  --   ALBUMIN 3.5 3.3*   Recent Labs    11/12/21 0000 01/16/22 0000 07/06/22 0000  WBC 8.1 7.6 8.8  HGB 12.1 11.7* 11.6*  HCT 38 34* 37  PLT 213 230 225   Lab Results  Component Value Date   TSH 1.77 07/06/2022   Lab Results  Component Value Date   HGBA1C 6.1 03/01/2019   Lab Results  Component Value Date   CHOL 200 10/18/2019   HDL 44 10/18/2019   LDLCALC 129 10/18/2019   TRIG 137 10/18/2019    Significant Diagnostic Results in last 30 days:  No results found.  Assessment/Plan 1. Acute on chronic congestive heart failure, unspecified heart failure type ([place]) - 02/12 CXR indicated mild CHF/pulmonary edema - 02/13 increased RR, pitting ankle edema, weight fluctuations, dry cough - completed furosemide 40 mg BID x 2 days - now on furosemide 40 mg po Qam - lung sounds clear, non pitting  ankle edema, no cough - cont weekly weights - consider torsemide in future if symptoms return  2. Vascular dementia without behavioral disturbance ([place]) - intermittent confusion> forgets husband has passed - ambulates with w/c - weights stable - sleeping more - cont Aricept - cont skilled nursing  3. Primary hypertension - controlled - cont cozaar and nebvolol  4. AV block - s/p pacemaker  5. Venous insufficiency - cont leg elevation and ted hose  6. Obstructive sleep apnea on CPAP - cont CPAP qhs  7. Depression, major, recurrent, in partial remission ([place]) - still has periods of  grieving husband> worsened by poor memory - consider Wellbutrin is somnolence worsens - cont Cymbalta  8. Acquired hypothyroidism - TSH stable - cont levothyroxine  9. B12 deficiency - vitamin B12  10. Breast lump in female - no further workup per family     Family/ staff Communication: plan discussed with patient and family   Labs/tests ordered: none

## 2022-07-15 DIAGNOSIS — Z79899 Other long term (current) drug therapy: Secondary | ICD-10-CM | POA: Diagnosis not present

## 2022-07-15 LAB — BASIC METABOLIC PANEL
BUN: 14 (ref 4–21)
CO2: 25 — AB (ref 13–22)
Chloride: 102 (ref 99–108)
Creatinine: 1 (ref 0.5–1.1)
Glucose: 110
Potassium: 4 mEq/L (ref 3.5–5.1)
Sodium: 140 (ref 137–147)

## 2022-07-15 LAB — COMPREHENSIVE METABOLIC PANEL
Calcium: 9.7 (ref 8.7–10.7)
eGFR: 51

## 2022-07-19 ENCOUNTER — Encounter: Payer: Self-pay | Admitting: Adult Health

## 2022-07-19 ENCOUNTER — Non-Acute Institutional Stay (SKILLED_NURSING_FACILITY): Payer: Medicare Other | Admitting: Adult Health

## 2022-07-19 DIAGNOSIS — R5383 Other fatigue: Secondary | ICD-10-CM

## 2022-07-19 DIAGNOSIS — L304 Erythema intertrigo: Secondary | ICD-10-CM

## 2022-07-19 DIAGNOSIS — N6321 Unspecified lump in the left breast, upper outer quadrant: Secondary | ICD-10-CM | POA: Diagnosis not present

## 2022-07-19 NOTE — Progress Notes (Signed)
Location:  Occupational psychologist of Service:  SNF (31) Provider:   Cindi Carbon, [place] [phone]   Virgie Dad, MD  Patient Care Team: Virgie Dad, MD as PCP - General (Internal Medicine) Sueanne Margarita, MD as PCP - Sleep Medicine (Sleep Medicine) Evans Lance, MD as PCP - Cardiology (Cardiology) Justice Britain, MD as Consulting Physician (Orthopedic Surgery) Gaynelle Arabian, MD as Consulting Physician (Orthopedic Surgery) Garvin Fila, MD as Consulting Physician (Neurology) Evans Lance, MD as Consulting Physician (Cardiology) Jettie Booze, MD as Consulting Physician (Interventional Cardiology) Shon Hough, MD as Consulting Physician (Ophthalmology) Community, [facility] ([place]) Royal Hawthorn, NP as Nurse Practitioner (Nurse Practitioner)  Extended Emergency Contact Information Primary Emergency Contact: [place],JED Address: [place], [place] Phone: [phone] Mobile Phone: [phone] Relation: Son Secondary Emergency Contact: [place] Mobile Phone: [phone] Relation: Daughter  Code Status:  DNR Goals of care: Advanced Directive information    07/13/2022   11:10 AM  Advanced Directives  Does Patient Have a Medical Advance Directive? Yes  Type of Advance Directive Out of facility DNR (pink MOST or yellow form)  Does patient want to make changes to medical advance directive? No - Patient declined     Chief Complaint  Patient presents with   Acute Visit    Rash fatigue    HPI:  Pt is a 89 y.o. female seen today for an acute visit for rash under left arm area. Present for several days. No itching or drainage.   Has left breast lump that is not painful. No open areas or drainage. Hx of breast ca. Family declined mammogram in the past.   Also has been falling asleep at the table through the day.  Needs more reminders and cuing. More confused over time. States she was playing tennis and walking a lot so she is having delusions. Very pleasant. Denies depression. Does feel sad over the loss of her husband. Can be redirected.   Treated for CHF earlier this month with increased lasix dosing. No current sob. Edema ok. Weight is down 6 lbs after treatment.   Wt Readings from Last 3 Encounters:  07/19/22 226 lb 12.8 oz (102.9 kg)  07/13/22 231 lb 12.8 oz (105.1 kg)  07/07/22 232 lb 12.8 oz (105.6 kg)      Past Medical History:  Diagnosis Date   Breast cancer ([place])    Colon polyps    Coronary artery disease 06/24/2006   STEMI inferior with BMS to mid RCA with mid basal inferior wall HK and luminal irrgularities elsewhere   Fracture of femoral neck, right ([place]) 06/13/2011   Fracture of humeral head, right, closed 06/13/2011   GERD (gastroesophageal reflux disease)    Hemorrhoids    Hyperlipemia    Hypertension    LBBB (left bundle branch block)    Macular degeneration    Myocardial infarction ([place])    Obstructive sleep apnea on CPAP    Osteopenia    Osteopenia    Pacemaker 02/25/2014   Rhinitis    Rotator cuff syndrome    SAH (subarachnoid hemorrhage) ([place]) 02/22/2014   Stokes Adams attack 02/22/2014   Ulnar neuropathy    Umbilical hernia    Unstable gait    Vascular dementia without behavioral disturbance ([place]) 07/04/2019   MMSE 10/11/19 28/30, 06/08/20 28/30   Wears hearing aid  Per [facility] records   Past Surgical History:  Procedure Laterality Date   BREAST LUMPECTOMY     CATARACT EXTRACTION, BILATERAL     Per [facility]   CORONARY ANGIOPLASTY WITH STENT PLACEMENT  06/2006   Mid RCA BMS   HIP ARTHROPLASTY  06/06/2011   Procedure: ARTHROPLASTY BIPOLAR HIP;  Surgeon: Gearlean Alf;  Location: [facility] ORS;  Service: Orthopedics;  Laterality: Right;   PERMANENT PACEMAKER INSERTION N/A 02/25/2014   Procedure: PERMANENT PACEMAKER INSERTION;  Surgeon: Evans Lance, MD;  Location: [facility] CATH LAB;  Service: Cardiovascular;  Laterality: N/A;   SHOULDER SURGERY     TONSILLECTOMY     Per [facility]    WRIST SURGERY      Allergies  Allergen Reactions   Chlor-Trimeton [Chlorpheniramine] Anaphylaxis   Codeine Anaphylaxis   Nystatin Rash    redness   Amlodipine     Per [facility]     Outpatient Encounter Medications as of 07/19/2022  Medication Sig   acetaminophen (TYLENOL) 500 MG tablet Take 500 mg by mouth every 8 (eight) hours as needed.    ammonium lactate (AMLACTIN) 12 % cream Apply 1 Application topically at bedtime.   aspirin EC 81 MG tablet Take 81 mg by mouth daily.   Carboxymethylcellulose Sodium (ARTIFICIAL TEARS OP) Apply 1 drop to eye as needed.   Chlorhexidine Gluconate 4 % SOLN Apply 1 application topically daily. On Tuesday or Friday   cholecalciferol (VITAMIN D) 25 MCG (1000 UNIT) tablet Take 3,000 Units by mouth daily. In the morning between 8-11 am   donepezil (ARICEPT) 10 MG tablet Take 1 tablet (10 mg total) by mouth at bedtime.   DULoxetine (CYMBALTA) 60 MG capsule Take 60 mg by mouth daily.   Emollient (CERAVE DAILY MOISTURIZING) LOTN Apply topically daily.   furosemide (LASIX) 20 MG tablet Take 20 mg by mouth every morning.   furosemide (LASIX) 40 MG tablet Take 40 mg by mouth every morning. And prn edema   furosemide (LASIX) 40 MG tablet Take 40 mg by mouth as needed for edema or fluid.   KETOCONAZOLE, TOPICAL, 1 % SHAM Apply 1 application  topically 2 (two) times a week. Leave on for 15 min then rinse.   levothyroxine (SYNTHROID, LEVOTHROID) 25 MCG tablet Take 25 mcg by mouth daily before breakfast.    losartan (COZAAR) 100 MG tablet Take 100 mg by mouth daily.   miconazole (MICOTIN) 2 % cream Apply 1 application topically as needed (Apply to rash under breasts and abdominal folds as needed).   mineral oil-hydrophilic petrolatum (AQUAPHOR) ointment Apply topically as needed for dry skin.    Multiple Vitamins-Minerals (PRESERVISION AREDS 2+MULTI VIT PO) Take by mouth 2 (two) times daily.   nebivolol (BYSTOLIC) 10 MG tablet Take 1 tablet (10 mg total) by mouth daily.   nitroGLYCERIN (NITROSTAT) 0.4 MG SL tablet Place 0.4 mg under the tongue every 5 (five) minutes as needed.   ondansetron (ZOFRAN) 4 MG tablet Take 4 mg by mouth every 6 (six) hours as needed for nausea or vomiting.   potassium chloride SA (K-DUR,KLOR-CON) 20 MEQ tablet Take 20 mEq by mouth daily.    pramoxine (SARNA SENSITIVE) 1 % LOTN Apply topically. Daily and as needed   pramoxine (SARNA SENSITIVE) 1 % LOTN Apply 1 Application topically every evening.   sodium fluoride (PREVIDENT 5000 PLUS) 1.1 % CREA dental cream Place 1 application  onto teeth at bedtime.   vitamin B-12 (CYANOCOBALAMIN) 1000 MCG tablet  Take 1,000 mcg by mouth daily.   No facility-administered encounter medications on file as of 07/19/2022.    Review of Systems  Constitutional:  Positive for fatigue. Negative for activity change, appetite change (falls asleep at meals), chills, diaphoresis, fever and unexpected weight change.  HENT:  Negative for congestion.   Respiratory:  Negative for cough, shortness of breath and wheezing.   Cardiovascular:  Positive for leg swelling. Negative for chest pain and palpitations.  Gastrointestinal:  Negative for abdominal distention, abdominal pain, constipation and diarrhea.  Genitourinary:  Negative for difficulty urinating and dysuria.  Musculoskeletal:  Positive for gait problem. Negative for arthralgias, back pain, joint swelling and myalgias.  Neurological:  Negative for dizziness, tremors, seizures, syncope, facial asymmetry, speech difficulty, weakness, light-headedness, numbness and headaches.  Psychiatric/Behavioral:  Positive for confusion. Negative for agitation, behavioral problems and sleep disturbance. The patient is not nervous/anxious.     Immunization History  Administered Date(s) Administered    Influenza, High Dose Seasonal PF 02/18/2017, 03/22/2019, 03/14/2020   Influenza,inj,Quad PF,6+ Mos 03/24/2018   Influenza-Unspecified 03/04/2016, 03/18/2020, 02/25/2021, 02/26/2022   Moderna Covid-19 Vaccine Bivalent Booster 71yr & up 09/18/2021, 03/29/2022   Moderna SARS-COV2 Booster Vaccination 04/03/2020, 01/13/2021   Moderna Sars-Covid-2 Vaccination 06/04/2019, 07/03/2019, 03/04/2021   Pneumococcal Conjugate-13 05/08/2014   Pneumococcal Polysaccharide-23 10/08/2017   Td 07/09/2002, 01/16/2013   Zoster Recombinat (Shingrix) 11/18/2017, 01/17/2018   Zoster, Live 06/30/2010   Pertinent  Health Maintenance Due  Topic Date Due   INFLUENZA VACCINE  Completed   DEXA SCAN  Completed      07/04/2019    9:05 AM 01/15/2021    1:46 PM 08/18/2021   12:45 PM 04/20/2022    2:25 PM 07/07/2022   10:36 AM  [place]ast Prairiein the past year? 0  1 0 0  Was there an injury with Fall? 0  0 0 0  Fall Risk Category Calculator 0  1 0 0  Fall Risk Category (Retired) Low  Low Low   (RETIRED) Patient Fall Risk Level Low fall risk Moderate fall risk Moderate fall risk Moderate fall risk   Patient at Risk for Falls Due to  History of fall(s) History of fall(s);Impaired balance/gait;Impaired mobility;Orthopedic patient No Fall Risks;History of fall(s);Impaired balance/gait;Orthopedic patient;Impaired mobility History of fall(s)  Patient at Risk for Falls Due to - Comments   chronic knee pain Chronic knee pain   Fall risk Follow up  Falls evaluation completed;Follow up appointment Education provided;Falls prevention discussed Falls evaluation completed Falls evaluation completed   Functional Status Survey:    Vitals:   07/19/22 1143  BP: 122/70  Pulse: 67  Resp: 20  Temp: 97.8 F (36.6 C)  SpO2: 97%  Weight: 226 lb 12.8 oz (102.9 kg)   Body mass index is 44.29 kg/m. Physical Exam Vitals and nursing note reviewed.  Constitutional:      General: She is not in acute distress.    Appearance: She  is not diaphoretic.  HENT:     Head: Normocephalic and atraumatic.  Neck:     Vascular: No JVD.  Cardiovascular:     Rate and Rhythm: Normal rate and regular rhythm.     Heart sounds: No murmur heard. Pulmonary:     Effort: Pulmonary effort is normal. No respiratory distress.     Breath sounds: Rales (slight left base) present. No wheezing.  Chest:     Chest wall: No lacerations, deformity, swelling, tenderness or crepitus.  Breasts:    Right: Normal.  Left: Mass and skin change present. No swelling, bleeding, inverted nipple, nipple discharge or tenderness.    Musculoskeletal:     Comments: BLE edema +1  Skin:    General: Skin is warm and dry.     Comments: Erythema to BLE which is chronic   Neurological:     General: No focal deficit present.     Mental Status: She is alert. Mental status is at baseline.     Labs reviewed: Recent Labs    10/19/21 0000 01/16/22 0000 07/06/22 0000  NA 142 143 138  K 4.0 4.0 4.1  CL 104 105 99  CO2 29* 27* 22  BUN '15 13 16  '$ CREATININE 1.0 0.9 1.0  CALCIUM 9.0 9.1 9.1   Recent Labs    10/19/21 0000 01/16/22 0000  AST 14  --   ALT 7  --   ALKPHOS 104  --   ALBUMIN 3.5 3.3*   Recent Labs    11/12/21 0000 01/16/22 0000 07/06/22 0000  WBC 8.1 7.6 8.8  HGB 12.1 11.7* 11.6*  HCT 38 34* 37  PLT 213 230 225   Lab Results  Component Value Date   TSH 1.77 07/06/2022   Lab Results  Component Value Date   HGBA1C 6.1 03/01/2019   Lab Results  Component Value Date   CHOL 200 10/18/2019   HDL 44 10/18/2019   LDLCALC 129 10/18/2019   TRIG 137 10/18/2019    Significant Diagnostic Results in last 30 days:  No results found.  Assessment/Plan 1. Intertrigo Note to skin fold at breast lump Apply miconazole bid x 7 days   2. Mass of upper outer quadrant of left breast No further work up per family request due to age/dementia  3. Other fatigue Unremarkable labs Vital WNL Could be sleeping more due to progression  in dementia and/or issue with sleep apnea. Due to her goals of care, age, and dementia further work up would not likely be helpful. In the visit I did not see enough in terms of signs of depression to consider med change at this time.     Family/ staff Communication: nurse  Labs/tests ordered:  NA

## 2022-07-23 ENCOUNTER — Non-Acute Institutional Stay (SKILLED_NURSING_FACILITY): Payer: Medicare Other | Admitting: Adult Health

## 2022-07-23 ENCOUNTER — Encounter: Payer: Self-pay | Admitting: Adult Health

## 2022-07-23 DIAGNOSIS — K047 Periapical abscess without sinus: Secondary | ICD-10-CM | POA: Diagnosis not present

## 2022-07-23 MED ORDER — TRAMADOL HCL 50 MG PO TABS: 50.0000 mg | ORAL_TABLET | Freq: Two times a day (BID) | ORAL | 0 refills | Status: AC | PRN

## 2022-07-23 MED ORDER — AMOXICILLIN-POT CLAVULANATE 875-125 MG PO TABS: 1.0000 | ORAL_TABLET | Freq: Two times a day (BID) | ORAL | 0 refills | Status: AC

## 2022-07-23 NOTE — Progress Notes (Unsigned)
Location:   [place] Room Number: [place] of Service:  SNF [phone]) Provider:  Royal Hawthorn, NP  Virgie Dad, MD  Patient Care Team: Virgie Dad, MD as PCP - General (Internal Medicine) Sueanne Margarita, MD as PCP - Sleep Medicine (Sleep Medicine) Evans Lance, MD as PCP - Cardiology (Cardiology) Justice Britain, MD as Consulting Physician (Orthopedic Surgery) Gaynelle Arabian, MD as Consulting Physician (Orthopedic Surgery) Garvin Fila, MD as Consulting Physician (Neurology) Evans Lance, MD as Consulting Physician (Cardiology) Jettie Booze, MD as Consulting Physician (Interventional Cardiology) Shon Hough, MD as Consulting Physician (Ophthalmology) Community, [facility] ([place]) Royal Hawthorn, NP as Nurse Practitioner (Nurse Practitioner)  Extended Emergency Contact Information Primary Emergency Contact: [place],JED Address: [place], [place] of [place] Phone: [phone] Mobile Phone: [phone] Relation: Son Secondary Emergency Contact: [place] Mobile Phone: [phone] Relation: Daughter  Code Status:  DNR Goals of care: Advanced Directive information    07/23/2022   11:18 AM  Advanced Directives  Does Patient Have a Medical Advance Directive? Yes  Type of Paramedic of [place];Out of facility DNR (pink MOST or yellow form)  Does patient want to make changes to medical advance directive? No - Patient declined  Copy of [place] in Chart? Yes - validated most recent copy scanned in chart (See row information)  Pre-existing out of facility DNR order (yellow form or pink MOST form) Yellow form placed in chart (order not valid for inpatient use)     Chief Complaint  Patient presents with   Acute Visit    Mouth pain    HPI:  Pt is a 89 y.o. female seen today for an acute  visit for mouth pain. Pt has had mouth pain for several days. At first the top portion and later the bottom portion. The pain is progressively worse and she is not wanting to eat. She also has a chipped tooth. She is not having a fever. No vomiting or mouth swelling.    Past Medical History:  Diagnosis Date   Breast cancer ([place])    Colon polyps    Coronary artery disease 06/24/2006   STEMI inferior with BMS to mid RCA with mid basal inferior wall HK and luminal irrgularities elsewhere   Fracture of femoral neck, right ([place]) 06/13/2011   Fracture of humeral head, right, closed 06/13/2011   GERD (gastroesophageal reflux disease)    Hemorrhoids    Hyperlipemia    Hypertension    LBBB (left bundle branch block)    Macular degeneration    Myocardial infarction ([place])    Obstructive sleep apnea on CPAP    Osteopenia    Osteopenia    Pacemaker 02/25/2014   Rhinitis    Rotator cuff syndrome    SAH (subarachnoid hemorrhage) ([place]) 02/22/2014   Stokes Adams attack 02/22/2014   Ulnar neuropathy    Umbilical hernia    Unstable gait    Vascular dementia without behavioral disturbance ([place]) 07/04/2019   MMSE 10/11/19 28/30, 06/08/20 28/30   Wears hearing aid    Per [facility] records   Past Surgical History:  Procedure Laterality Date   BREAST LUMPECTOMY     CATARACT EXTRACTION, BILATERAL     Per [facility]   CORONARY ANGIOPLASTY WITH STENT PLACEMENT  06/2006   Mid RCA BMS   HIP ARTHROPLASTY  06/06/2011  Procedure: ARTHROPLASTY BIPOLAR HIP;  Surgeon: Gearlean Alf;  Location: [facility];  Service: Orthopedics;  Laterality: Right;   PERMANENT PACEMAKER INSERTION N/A 02/25/2014   Procedure: PERMANENT PACEMAKER INSERTION;  Surgeon: Evans Lance, MD;  Location: [facility] CATH LAB;  Service: Cardiovascular;  Laterality: N/A;   SHOULDER SURGERY     TONSILLECTOMY     Per [facility]    WRIST SURGERY      Allergies  Allergen Reactions   Chlor-Trimeton [Chlorpheniramine]  Anaphylaxis   Codeine Anaphylaxis   Nystatin Rash    redness   Amlodipine     Per [facility]     Allergies as of 07/23/2022       Reactions   Chlor-trimeton [chlorpheniramine] Anaphylaxis   Codeine Anaphylaxis   Nystatin Rash   redness   Amlodipine    Per [facility]         Medication List        Accurate as of July 23, 2022 11:33 AM. If you have any questions, ask your nurse or doctor.          acetaminophen 500 MG tablet Commonly known as: TYLENOL Take 500 mg by mouth every 8 (eight) hours as needed.   ammonium lactate 12 % cream Commonly known as: AMLACTIN Apply 1 Application topically at bedtime.   ARTIFICIAL TEARS OP Apply 1 drop to eye as needed.   aspirin EC 81 MG tablet Take 81 mg by mouth daily.   Chlorhexidine Gluconate 4 % Soln Apply 1 application topically daily. On Tuesday or Friday   cholecalciferol 25 MCG (1000 UT) tablet Generic drug: Cholecalciferol Take 3,000 Units by mouth daily. In the morning between 8-11 am   cyanocobalamin 1000 MCG tablet Commonly known as: VITAMIN B12 Take 1,000 mcg by mouth daily.   donepezil 10 MG tablet Commonly known as: Aricept Take 1 tablet (10 mg total) by mouth at bedtime.   DULoxetine 60 MG capsule Commonly known as: CYMBALTA Take 60 mg by mouth daily.   furosemide 40 MG tablet Commonly known as: LASIX Take 40 mg by mouth every morning. And prn edema   furosemide 40 MG tablet Commonly known as: LASIX Take 40 mg by mouth as needed for edema or fluid.   furosemide 20 MG tablet Commonly known as: LASIX Take 20 mg by mouth. Daily in the afternoon   KETOCONAZOLE (TOPICAL) 1 % Sham Apply 1 application  topically 2 (two) times a week. Leave on for 15 min then rinse.   levothyroxine 25 MCG tablet Commonly known as: SYNTHROID Take 25 mcg by mouth daily before breakfast.   losartan 100 MG tablet Commonly known as: COZAAR Take 100 mg by mouth daily.   miconazole 2 %  cream Commonly known as: MICOTIN Apply 1 application topically as needed (Apply to rash under breasts and abdominal folds as needed).   mineral oil-hydrophilic petrolatum ointment Apply topically as needed for dry skin.   CeraVe Daily Moisturizing Lotn Apply topically daily.   nebivolol 10 MG tablet Commonly known as: Bystolic Take 1 tablet (10 mg total) by mouth daily.   nitroGLYCERIN 0.4 MG SL tablet Commonly known as: NITROSTAT Place 0.4 mg under the tongue every 5 (five) minutes as needed.   ondansetron 4 MG tablet Commonly known as: ZOFRAN Take 4 mg by mouth every 6 (six) hours as needed for nausea or vomiting.   potassium chloride SA 20 MEQ tablet Commonly known as: KLOR-CON M Take 20 mEq by mouth daily.  PRESERVISION AREDS 2+MULTI VIT PO Take by mouth 2 (two) times daily.   Sarna Sensitive 1 % Lotn Generic drug: pramoxine Apply topically. Daily and as needed   Sarna Sensitive 1 % Lotn Generic drug: pramoxine Apply 1 Application topically every evening.   sodium fluoride 1.1 % Crea dental cream Commonly known as: PREVIDENT 5000 PLUS Place 1 application  onto teeth at bedtime.        Review of Systems  Constitutional:  Negative for activity change, appetite change, chills, diaphoresis, fatigue, fever and unexpected weight change.  HENT:  Positive for dental problem. Negative for congestion, drooling, ear discharge, facial swelling, rhinorrhea, sore throat and trouble swallowing.   Respiratory:  Negative for cough and shortness of breath.   Cardiovascular:  Positive for leg swelling. Negative for chest pain and palpitations.  Gastrointestinal:  Negative for nausea and vomiting.    Immunization History  Administered Date(s) Administered   Influenza, High Dose Seasonal PF 02/18/2017, 03/22/2019, 03/14/2020   Influenza,inj,Quad PF,6+ Mos 03/24/2018   Influenza-Unspecified 03/04/2016, 03/18/2020, 02/25/2021, 02/26/2022   Moderna Covid-19 Vaccine Bivalent  Booster 69yr & up 09/18/2021, 03/29/2022   Moderna SARS-COV2 Booster Vaccination 04/03/2020, 01/13/2021   Moderna Sars-Covid-2 Vaccination 06/04/2019, 07/03/2019, 03/04/2021   Pneumococcal Conjugate-13 05/08/2014   Pneumococcal Polysaccharide-23 10/08/2017   Td 07/09/2002, 01/16/2013   Zoster Recombinat (Shingrix) 11/18/2017, 01/17/2018   Zoster, Live 06/30/2010   Pertinent  Health Maintenance Due  Topic Date Due   INFLUENZA VACCINE  Completed   DEXA SCAN  Completed      07/04/2019    9:05 AM 01/15/2021    1:46 PM 08/18/2021   12:45 PM 04/20/2022    2:25 PM 07/07/2022   10:36 AM  [place]in the past year? 0  1 0 0  Was there an injury with Fall? 0  0 0 0  Fall Risk Category Calculator 0  1 0 0  Fall Risk Category (Retired) Low  Low Low   (RETIRED) Patient Fall Risk Level Low fall risk Moderate fall risk Moderate fall risk Moderate fall risk   Patient at Risk for Falls Due to  History of fall(s) History of fall(s);Impaired balance/gait;Impaired mobility;Orthopedic patient No Fall Risks;History of fall(s);Impaired balance/gait;Orthopedic patient;Impaired mobility History of fall(s)  Patient at Risk for Falls Due to - Comments   chronic knee pain Chronic knee pain   Fall risk Follow up  Falls evaluation completed;Follow up appointment Education provided;Falls prevention discussed Falls evaluation completed Falls evaluation completed   Functional Status Survey:    Vitals:   07/23/22 1113  BP: (!) 132/58  Pulse: 75  Resp: 19  Temp: (!) 97.5 F (36.4 C)  SpO2: 97%  Weight: 228 lb 9.6 oz (103.7 kg)  Height: 5' (1.524 m)   Body mass index is 44.65 kg/m. Physical Exam Vitals and nursing note reviewed.  Constitutional:      General: She is not in acute distress.    Appearance: She is not diaphoretic.  HENT:     Head: Normocephalic and atraumatic.     Mouth/Throat:     Lips: Pink.     Mouth: Mucous membranes are moist.     Pharynx: Oropharynx is clear.   Neck:      Vascular: No JVD.  Cardiovascular:     Rate and Rhythm: Normal rate and regular rhythm.     Heart sounds: No murmur heard. Pulmonary:     Effort: Pulmonary effort is normal. No respiratory distress.     Breath sounds:  Normal breath sounds. No wheezing.  Musculoskeletal:     Cervical back: No rigidity or tenderness.  Lymphadenopathy:     Cervical: No cervical adenopathy.  Skin:    General: Skin is warm and dry.  Neurological:     Mental Status: She is alert. Mental status is at baseline.     Labs reviewed: Recent Labs    01/16/22 0000 07/06/22 0000 07/15/22 0000  NA 143 138 140  K 4.0 4.1 4.0  CL 105 99 102  CO2 27* 22 25*  BUN '13 16 14  '$ CREATININE 0.9 1.0 1.0  CALCIUM 9.1 9.1 9.7   Recent Labs    10/19/21 0000 01/16/22 0000  AST 14  --   ALT 7  --   ALKPHOS 104  --   ALBUMIN 3.5 3.3*   Recent Labs    11/12/21 0000 01/16/22 0000 07/06/22 0000  WBC 8.1 7.6 8.8  HGB 12.1 11.7* 11.6*  HCT 38 34* 37  PLT 213 230 225   Lab Results  Component Value Date   TSH 1.77 07/06/2022   Lab Results  Component Value Date   HGBA1C 6.1 03/01/2019   Lab Results  Component Value Date   CHOL 200 10/18/2019   HDL 44 10/18/2019   LDLCALC 129 10/18/2019   TRIG 137 10/18/2019    Significant Diagnostic Results in last 30 days:  No results found.  Assessment/Plan  1. Dental infection  - traMADol (ULTRAM) 50 MG tablet; Take 1 tablet (50 mg total) by mouth every 12 (twelve) hours as needed for up to 5 days. Pt has tolerate ultram in the past. Monitor for adverse reaction  Dispense: 10 tablet; Refill: 0  - amoxicillin-clavulanate (AUGMENTIN) 875-125 MG tablet; Take 1 tablet by mouth 2 (two) times daily for 7 days.  Dispense: 14 tablet; Refill: 0  Dental referral urgent  Family/ staff Communication: nurse to communicate with POA  Labs/tests ordered:   NA

## 2022-07-24 ENCOUNTER — Encounter: Payer: Self-pay | Admitting: Adult Health

## 2022-08-02 ENCOUNTER — Non-Acute Institutional Stay (SKILLED_NURSING_FACILITY): Payer: Medicare Other | Admitting: Internal Medicine

## 2022-08-02 ENCOUNTER — Encounter: Payer: Self-pay | Admitting: Internal Medicine

## 2022-08-02 DIAGNOSIS — I5032 Chronic diastolic (congestive) heart failure: Secondary | ICD-10-CM | POA: Diagnosis not present

## 2022-08-02 DIAGNOSIS — Z950 Presence of cardiac pacemaker: Secondary | ICD-10-CM

## 2022-08-02 DIAGNOSIS — N63 Unspecified lump in unspecified breast: Secondary | ICD-10-CM

## 2022-08-02 DIAGNOSIS — F3341 Major depressive disorder, recurrent, in partial remission: Secondary | ICD-10-CM | POA: Diagnosis not present

## 2022-08-02 DIAGNOSIS — F0150 Vascular dementia without behavioral disturbance: Secondary | ICD-10-CM

## 2022-08-02 DIAGNOSIS — E039 Hypothyroidism, unspecified: Secondary | ICD-10-CM

## 2022-08-02 DIAGNOSIS — G4733 Obstructive sleep apnea (adult) (pediatric): Secondary | ICD-10-CM | POA: Diagnosis not present

## 2022-08-02 DIAGNOSIS — I10 Essential (primary) hypertension: Secondary | ICD-10-CM | POA: Diagnosis not present

## 2022-08-02 NOTE — Progress Notes (Signed)
Location:   [facility] Room Number: [id number] Place of Service:  SNF [phone]) Provider:  Sherre Lain    Patient Care Team: Virgie Dad, MD as PCP - General (Internal Medicine) Sueanne Margarita, MD as PCP - Sleep Medicine (Sleep Medicine) Evans Lance, MD as PCP - Cardiology (Cardiology) Justice Britain, MD as Consulting Physician (Orthopedic Surgery) Gaynelle Arabian, MD as Consulting Physician (Orthopedic Surgery) Garvin Fila, MD as Consulting Physician (Neurology) Evans Lance, MD as Consulting Physician (Cardiology) Jettie Booze, MD as Consulting Physician (Interventional Cardiology) Shon Hough, MD as Consulting Physician (Ophthalmology) Community, [facility] ([place]) Royal Hawthorn, NP as Nurse Practitioner (Nurse Practitioner)  Extended Emergency Contact Information Primary Emergency Contact: Robenson,JED Address: [place], [place] Phone: [phone] Mobile Phone: [phone] Relation: Son Secondary Emergency Contact: [place] Mobile Phone: [phone] Relation: Daughter  Code Status:  DNR Goals of care: Advanced Directive information    08/02/2022    1:58 PM  Advanced Directives  Does Patient Have a Medical Advance Directive? Yes  Type of Paramedic of [place];Out of facility DNR (pink MOST or yellow form)  Does patient want to make changes to medical advance directive? No - Patient declined  Copy of [place] in Chart? Yes - validated most recent copy scanned in chart (See row information)     Chief Complaint  Patient presents with   Medical Management of Chronic Issues    Routine Visit.    HPI:  Pt is a 89 y.o. female seen today for medical management of chronic diseases.    Lives in SNF in [place]   Patient has h/o Chronic Diastolic CHF, Urinary Incontinence, HTN,  Osteoporosis, HLD, B 12 Def, anemia, H/o  Breast cancer and Vascular Dementia  h/o SAH and OSA Has h/o Breast lump No Work up per her request  Recent issues Worsening CHF Lasix dose was increased and she seems to be doing well Had no Cough or SOB Tooth infection Not complaining of any pain Eating better  Continues to be pleasantly confused Nurses had no new issues Wheelchair Bound  Behavior controlled  Wt Readings from Last 3 Encounters:  08/02/22 228 lb (103.4 kg)  07/23/22 228 lb 9.6 oz (103.7 kg)  07/19/22 226 lb 12.8 oz (102.9 kg)     Past Medical History:  Diagnosis Date   Breast cancer ([place])    Colon polyps    Coronary artery disease 06/24/2006   STEMI inferior with BMS to mid RCA with mid basal inferior wall HK and luminal irrgularities elsewhere   Fracture of femoral neck, right ([place]) 06/13/2011   Fracture of humeral head, right, closed 06/13/2011   GERD (gastroesophageal reflux disease)    Hemorrhoids    Hyperlipemia    Hypertension    LBBB (left bundle branch block)    Macular degeneration    Myocardial infarction ([place])    Obstructive sleep apnea on CPAP    Osteopenia    Osteopenia    Pacemaker 02/25/2014   Rhinitis    Rotator cuff syndrome    SAH (subarachnoid hemorrhage) ([place]) 02/22/2014   [place] attack 02/22/2014   Ulnar neuropathy    Umbilical hernia    Unstable gait    Vascular dementia without behavioral disturbance ([place]) 07/04/2019   MMSE 10/11/19 28/30, 06/08/20 28/30   Wears hearing aid    Per [facility] [facility]  records   Past Surgical History:  Procedure Laterality Date   BREAST LUMPECTOMY     CATARACT EXTRACTION, BILATERAL     Per [facility]   CORONARY ANGIOPLASTY WITH STENT PLACEMENT  06/2006   Mid RCA BMS   HIP ARTHROPLASTY  06/06/2011   Procedure: ARTHROPLASTY BIPOLAR HIP;  Surgeon: Gearlean Alf;  Location: [facility];  Service: Orthopedics;  Laterality: Right;   PERMANENT PACEMAKER INSERTION N/A 02/25/2014   Procedure:  PERMANENT PACEMAKER INSERTION;  Surgeon: Evans Lance, MD;  Location: [facility] CATH LAB;  Service: Cardiovascular;  Laterality: N/A;   SHOULDER SURGERY     TONSILLECTOMY     Per [facility]    WRIST SURGERY      Allergies  Allergen Reactions   Chlor-Trimeton [Chlorpheniramine] Anaphylaxis   Codeine Anaphylaxis   Nystatin Rash    redness   Amlodipine     Per [facility]     Allergies as of 08/02/2022       Reactions   Chlor-trimeton [chlorpheniramine] Anaphylaxis   Codeine Anaphylaxis   Nystatin Rash   redness   Amlodipine    Per [facility]         Medication List        Accurate as of August 02, 2022  1:58 PM. If you have any questions, ask your nurse or doctor.          acetaminophen 500 MG tablet Commonly known as: TYLENOL Take 500 mg by mouth every 8 (eight) hours as needed.   ammonium lactate 12 % cream Commonly known as: AMLACTIN Apply 1 Application topically at bedtime.   ARTIFICIAL TEARS OP Apply 1 drop to eye as needed.   aspirin EC 81 MG tablet Take 81 mg by mouth daily.   Chlorhexidine Gluconate 4 % Soln Apply 1 application topically daily. On Tuesday or Friday   cholecalciferol 25 MCG (1000 UT) tablet Generic drug: Cholecalciferol Take 3,000 Units by mouth daily. In the morning between 8-11 am   cyanocobalamin 1000 MCG tablet Commonly known as: VITAMIN B12 Take 1,000 mcg by mouth daily.   donepezil 10 MG tablet Commonly known as: Aricept Take 1 tablet (10 mg total) by mouth at bedtime.   DULoxetine 60 MG capsule Commonly known as: CYMBALTA Take 60 mg by mouth daily.   furosemide 40 MG tablet Commonly known as: LASIX Take 40 mg by mouth every morning. And prn edema   furosemide 40 MG tablet Commonly known as: LASIX Take 40 mg by mouth as needed for edema or fluid.   furosemide 20 MG tablet Commonly known as: LASIX Take 20 mg by mouth. Daily in the afternoon   furosemide 20 MG  tablet Commonly known as: LASIX Take 20 mg by mouth every morning.   KETOCONAZOLE (TOPICAL) 1 % Sham Apply 1 application  topically 2 (two) times a week. Leave on for 15 min then rinse.   levothyroxine 25 MCG tablet Commonly known as: SYNTHROID Take 25 mcg by mouth daily before breakfast.   losartan 100 MG tablet Commonly known as: COZAAR Take 100 mg by mouth daily.   miconazole 2 % cream Commonly known as: MICOTIN Apply 1 application topically as needed (Apply to rash under breasts and abdominal folds as needed).   mineral oil-hydrophilic petrolatum ointment Apply topically as needed for dry skin.   CeraVe Daily Moisturizing Lotn Apply topically daily.   nebivolol 10 MG tablet Commonly known as: Bystolic Take 1 tablet (10 mg total) by  mouth daily.   nitroGLYCERIN 0.4 MG SL tablet Commonly known as: NITROSTAT Place 0.4 mg under the tongue every 5 (five) minutes as needed.   ondansetron 4 MG tablet Commonly known as: ZOFRAN Take 4 mg by mouth every 6 (six) hours as needed for nausea or vomiting.   potassium chloride SA 20 MEQ tablet Commonly known as: KLOR-CON M Take 20 mEq by mouth daily.   PRESERVISION AREDS 2+MULTI VIT PO Take by mouth 2 (two) times daily.   Sarna Sensitive 1 % Lotn Generic drug: pramoxine Apply topically. Daily and as needed   Sarna Sensitive 1 % Lotn Generic drug: pramoxine Apply 1 Application topically every evening.   sodium fluoride 1.1 % Crea dental cream Commonly known as: PREVIDENT 5000 PLUS Place 1 application  onto teeth at bedtime.        Review of Systems  Unable to perform ROS: Dementia    Immunization History  Administered Date(s) Administered   Influenza, High Dose Seasonal PF 02/18/2017, 03/22/2019, 03/14/2020   Influenza,inj,Quad PF,6+ Mos 03/24/2018   Influenza-Unspecified 03/04/2016, 03/18/2020, 02/25/2021, 02/26/2022   Moderna Covid-19 Vaccine Bivalent Booster 54yrs & up 09/18/2021, 03/29/2022   Moderna  SARS-COV2 Booster Vaccination 04/03/2020, 01/13/2021   Moderna Sars-Covid-2 Vaccination 06/04/2019, 07/03/2019, 03/04/2021   Pneumococcal Conjugate-13 05/08/2014   Pneumococcal Polysaccharide-23 10/08/2017   Td 07/09/2002, 01/16/2013   Zoster Recombinat (Shingrix) 11/18/2017, 01/17/2018   Zoster, Live 06/30/2010   Pertinent  Health Maintenance Due  Topic Date Due   INFLUENZA VACCINE  Completed   DEXA SCAN  Completed      07/04/2019    9:05 AM 01/15/2021    1:46 PM 08/18/2021   12:45 PM 04/20/2022    2:25 PM 07/07/2022   10:36 AM  [place] in the past year? 0  1 0 0  Was there an injury with Fall? 0  0 0 0  Fall Risk Category Calculator 0  1 0 0  Fall Risk Category (Retired) Low  Low Low   (RETIRED) Patient Fall Risk Level Low fall risk Moderate fall risk Moderate fall risk Moderate fall risk   Patient at Risk for Falls Due to  History of fall(s) History of fall(s);Impaired balance/gait;Impaired mobility;Orthopedic patient No Fall Risks;History of fall(s);Impaired balance/gait;Orthopedic patient;Impaired mobility History of fall(s)  Patient at Risk for Falls Due to - Comments   chronic knee pain Chronic knee pain   Fall risk Follow up  Falls evaluation completed;Follow up appointment Education provided;Falls prevention discussed Falls evaluation completed Falls evaluation completed   Functional Status Survey:    Vitals:   08/02/22 1350  BP: (!) 132/58  Pulse: 75  Resp: 19  Temp: 98.1 F (36.7 C)  SpO2: 97%  Weight: 228 lb (103.4 kg)  Height: 5' (1.524 m)   Body mass index is 44.53 kg/m. Physical Exam Vitals reviewed.  Constitutional:      Appearance: Normal appearance.  HENT:     Head: Normocephalic.     Nose: Nose normal.     Mouth/Throat:     Mouth: Mucous membranes are moist.     Pharynx: Oropharynx is clear.  Eyes:     Pupils: Pupils are equal, round, and reactive to light.  Cardiovascular:     Rate and Rhythm: Normal rate and regular rhythm.      Pulses: Normal pulses.     Heart sounds: Normal heart sounds. No murmur heard. Pulmonary:     Effort: Pulmonary effort is normal.     Breath sounds: Normal  breath sounds.  Abdominal:     General: Abdomen is flat. Bowel sounds are normal.     Palpations: Abdomen is soft.  Musculoskeletal:        General: Swelling present.     Cervical back: Neck supple.  Skin:    General: Skin is warm.  Neurological:     General: No focal deficit present.     Mental Status: She is alert.  Psychiatric:        Mood and Affect: Mood normal.        Thought Content: Thought content normal.     Labs reviewed: Recent Labs    01/16/22 0000 07/06/22 0000 07/15/22 0000  NA 143 138 140  K 4.0 4.1 4.0  CL 105 99 102  CO2 27* 22 25*  BUN 13 16 14   CREATININE 0.9 1.0 1.0  CALCIUM 9.1 9.1 9.7   Recent Labs    10/19/21 0000 01/16/22 0000  AST 14  --   ALT 7  --   ALKPHOS 104  --   ALBUMIN 3.5 3.3*   Recent Labs    11/12/21 0000 01/16/22 0000 07/06/22 0000  WBC 8.1 7.6 8.8  HGB 12.1 11.7* 11.6*  HCT 38 34* 37  PLT 213 230 225   Lab Results  Component Value Date   TSH 1.77 07/06/2022   Lab Results  Component Value Date   HGBA1C 6.1 03/01/2019   Lab Results  Component Value Date   CHOL 200 10/18/2019   HDL 44 10/18/2019   LDLCALC 129 10/18/2019   TRIG 137 10/18/2019    Significant Diagnostic Results in last 30 days:  No results found.  Assessment/Plan 1. Chronic diastolic congestive heart failure (HCC) Seems to be doing well in Lasix now Creat stable No Recent Echo but with her age would manage it symptomatically  2. Vascular dementia without behavioral disturbance ([place]) Full Care in SNF Behaviors Manageable On Aricept 3. Primary hypertension On Losartan and Nebivolol  4. Obstructive sleep apnea on CPAP   5. Depression, major, recurrent, in partial remission ([place]) On Cymbalta  6. Acquired hypothyroidism TSH normal in 02/24  7. Breast lump in female No more  work up  8. Pacemaker     Family/ staff Communication:   Labs/tests ordered:

## 2022-08-10 ENCOUNTER — Ambulatory Visit (INDEPENDENT_AMBULATORY_CARE_PROVIDER_SITE_OTHER): Payer: Medicare Other

## 2022-08-10 DIAGNOSIS — I4430 Unspecified atrioventricular block: Secondary | ICD-10-CM

## 2022-08-11 LAB — CUP PACEART REMOTE DEVICE CHECK
Battery Remaining Longevity: 19 mo
Battery Remaining Percentage: 17 %
Battery Voltage: 2.8 V
Brady Statistic AP VP Percent: 68 %
Brady Statistic AP VS Percent: 1.1 %
Brady Statistic AS VP Percent: 28 %
Brady Statistic AS VS Percent: 2.9 %
Brady Statistic RA Percent Paced: 68 %
Brady Statistic RV Percent Paced: 95 %
Date Time Interrogation Session: 20240320031426
Implantable Lead Connection Status: 753985
Implantable Lead Connection Status: 753985
Implantable Lead Implant Date: 20151005
Implantable Lead Implant Date: 20151005
Implantable Lead Location: 753859
Implantable Lead Location: 753860
Implantable Pulse Generator Implant Date: 20151005
Lead Channel Impedance Value: 410 Ohm
Lead Channel Impedance Value: 560 Ohm
Lead Channel Pacing Threshold Amplitude: 0.75 V
Lead Channel Pacing Threshold Amplitude: 1 V
Lead Channel Pacing Threshold Pulse Width: 0.5 ms
Lead Channel Pacing Threshold Pulse Width: 0.5 ms
Lead Channel Sensing Intrinsic Amplitude: 12 mV
Lead Channel Sensing Intrinsic Amplitude: 5 mV
Lead Channel Setting Pacing Amplitude: 1 V
Lead Channel Setting Pacing Amplitude: 2 V
Lead Channel Setting Pacing Pulse Width: 0.5 ms
Lead Channel Setting Sensing Sensitivity: 4 mV
Pulse Gen Model: 2240
Pulse Gen Serial Number: 7666288

## 2022-08-18 DIAGNOSIS — M25551 Pain in right hip: Secondary | ICD-10-CM | POA: Diagnosis not present

## 2022-08-18 DIAGNOSIS — Z96641 Presence of right artificial hip joint: Secondary | ICD-10-CM | POA: Diagnosis not present

## 2022-08-18 DIAGNOSIS — W19XXXA Unspecified fall, initial encounter: Secondary | ICD-10-CM | POA: Diagnosis not present

## 2022-08-31 ENCOUNTER — Encounter: Payer: Self-pay | Admitting: Orthopedic Surgery

## 2022-08-31 ENCOUNTER — Non-Acute Institutional Stay (SKILLED_NURSING_FACILITY): Payer: Medicare Other | Admitting: Orthopedic Surgery

## 2022-08-31 DIAGNOSIS — I10 Essential (primary) hypertension: Secondary | ICD-10-CM | POA: Diagnosis not present

## 2022-08-31 DIAGNOSIS — K047 Periapical abscess without sinus: Secondary | ICD-10-CM | POA: Diagnosis not present

## 2022-08-31 DIAGNOSIS — G4733 Obstructive sleep apnea (adult) (pediatric): Secondary | ICD-10-CM | POA: Diagnosis not present

## 2022-08-31 DIAGNOSIS — F0150 Vascular dementia without behavioral disturbance: Secondary | ICD-10-CM

## 2022-08-31 DIAGNOSIS — N63 Unspecified lump in unspecified breast: Secondary | ICD-10-CM

## 2022-08-31 DIAGNOSIS — L602 Onychogryphosis: Secondary | ICD-10-CM

## 2022-08-31 DIAGNOSIS — F3341 Major depressive disorder, recurrent, in partial remission: Secondary | ICD-10-CM | POA: Diagnosis not present

## 2022-08-31 DIAGNOSIS — I4430 Unspecified atrioventricular block: Secondary | ICD-10-CM

## 2022-08-31 DIAGNOSIS — E039 Hypothyroidism, unspecified: Secondary | ICD-10-CM

## 2022-08-31 DIAGNOSIS — I872 Venous insufficiency (chronic) (peripheral): Secondary | ICD-10-CM

## 2022-08-31 DIAGNOSIS — M25551 Pain in right hip: Secondary | ICD-10-CM | POA: Diagnosis not present

## 2022-08-31 DIAGNOSIS — I5032 Chronic diastolic (congestive) heart failure: Secondary | ICD-10-CM

## 2022-08-31 NOTE — Progress Notes (Signed)
[facility] Room Number: 124-A Place of Service:  SNF [phone]) Provider:  Hazle Nordmann, NP  PCP: Mahlon Gammon, MD  Patient Care Team: Mahlon Gammon, MD as PCP - General (Internal Medicine) Quintella Reichert, MD as PCP - Sleep Medicine (Sleep Medicine) Marinus Maw, MD as PCP - Cardiology (Cardiology) Francena Hanly, MD as Consulting Physician (Orthopedic Surgery) Ollen Gross, MD as Consulting Physician (Orthopedic Surgery) Micki Riley, MD as Consulting Physician (Neurology) Marinus Maw, MD as Consulting Physician (Cardiology) Corky Crafts, MD as Consulting Physician (Interventional Cardiology) Mckinley Jewel, MD as Consulting Physician (Ophthalmology) Community, [facility] (Skilled Nursing Facility) Fletcher Anon, NP as Nurse Practitioner (Nurse Practitioner)  Extended Emergency Contact Information Primary Emergency Contact: Hummel,JED Address: [address]          Ginette Otto [place] Home Phone: [phone] Mobile Phone: [phone] Relation: Son Secondary Emergency Contact: MCGARRY,LEE Mobile Phone: [phone] Relation: Daughter  Code Status:  DNR Goals of care: Advanced Directive information    08/31/2022   10:31 AM  Advanced Directives  Does Patient Have a Medical Advance Directive? Yes  Type of Advance Directive Out of facility DNR (pink MOST or yellow form)  Does patient want to make changes to medical advance directive? No - Patient declined     Chief Complaint  Patient presents with   Medical Management of Chronic Issues    Routine Visit.    Health Maintenance    Discuss the need for Annual Wellness Visit.     HPI:  Pt is a 89 y.o. female seen today for medical management of chronic diseases.    She continues to reside on the skilled nursing unit at [facility]. PMH: aortic insufficiency, diastolic heart failure, pacemaker, CAD, HTN, MI, OSA on  CPAP, GERD, hypothyroidism, obesity, h/o breast cancer and unstable gait.   Right hip pain- mechanical fall 03/25, 03/27 x ray of right hip/pelvis negative for fracture/ dislocation, remains on tylenol prn for pain Tooth infection- 03/01 c/o right sided tooth pain, completed Augmentin x 7 days, 03/21 tooth to right lower side abstracted  Chronic CHF- 02/12 CXR revealed mild CHF/pulmonary edema vs interstitial pneumonitis> resolved with increased furosemide, remains on furosemide and weekly weights Vascular dementia- MMSE 28/30 02/2022, CT head 2019 noted moderate atrophy with moderate small vessel ischemic changes of white matter, dependent with ADLs except feeding, ambulates in wheelchair, remains on donepezil   HTN- BUN/creat BUN/creat 16/1.0 07/06/2022, remains on losartan and Bystolic AV block- s/p pacemaker- checked 08/10/2022 Venous insufficiency- see above OSA- CPAP qhs Depression- no changes in mood, remains on Cymbalta, Na + 138 07/06/2022 Hypothyroidism- TSH 1.77 (02/13)> was 4.37 10/19/2021, remains on levothyroxine Left breast lump- no workup per patient request  Recent blood pressure:  03/28- 124/75  03/27- 126/59  03/25- 140/82  Recent weights:  04/04- 230.6 lbs  03/06- 224.6 lbs  02/01- 233.2 lbs       Past Surgical History:  Procedure Laterality Date   BREAST LUMPECTOMY     CATARACT EXTRACTION, BILATERAL     Per [facility]   CORONARY ANGIOPLASTY WITH STENT PLACEMENT  06/2006   Mid RCA BMS   HIP ARTHROPLASTY  06/06/2011   Procedure: ARTHROPLASTY BIPOLAR HIP;  Surgeon: Loanne Drilling;  Location: [facility];  Service: Orthopedics;  Laterality: Right;   PERMANENT PACEMAKER INSERTION N/A 02/25/2014   Procedure: PERMANENT PACEMAKER INSERTION;  Surgeon: Marinus Maw, MD;  Location: [facility]  CATH LAB;  Service: Cardiovascular;  Laterality: N/A;   SHOULDER SURGERY     TONSILLECTOMY     Per [facility]    WRIST SURGERY      Allergies  Allergen  Reactions   Chlor-Trimeton [Chlorpheniramine] Anaphylaxis   Codeine Anaphylaxis   Nystatin Rash    redness   Amlodipine     Per [facility]     Allergies as of 08/31/2022       Reactions   Chlor-trimeton [chlorpheniramine] Anaphylaxis   Codeine Anaphylaxis   Nystatin Rash   redness   Amlodipine    Per [facility]         Medication List        Accurate as of August 31, 2022 10:32 AM. If you have any questions, ask your nurse or doctor.          acetaminophen 500 MG tablet Commonly known as: TYLENOL Take 500 mg by mouth every 8 (eight) hours as needed.   ammonium lactate 12 % cream Commonly known as: AMLACTIN Apply 1 Application topically at bedtime.   ARTIFICIAL TEARS OP Apply 1 drop to eye as needed.   aspirin EC 81 MG tablet Take 81 mg by mouth daily.   Chlorhexidine Gluconate 4 % Soln Apply 1 application topically daily. On Tuesday or Friday   cholecalciferol 25 MCG (1000 UT) tablet Generic drug: Cholecalciferol Take 3,000 Units by mouth daily. In the morning between 8-11 am   cyanocobalamin 1000 MCG tablet Commonly known as: VITAMIN B12 Take 1,000 mcg by mouth daily.   donepezil 10 MG tablet Commonly known as: Aricept Take 1 tablet (10 mg total) by mouth at bedtime.   DULoxetine 60 MG capsule Commonly known as: CYMBALTA Take 60 mg by mouth daily.   furosemide 40 MG tablet Commonly known as: LASIX Take 40 mg by mouth every morning.   furosemide 40 MG tablet Commonly known as: LASIX Take 40 mg by mouth as needed for edema or fluid.   furosemide 20 MG tablet Commonly known as: LASIX Take 20 mg by mouth. Daily in the afternoon   KETOCONAZOLE (TOPICAL) 1 % Sham Apply 1 application  topically 2 (two) times a week. Leave on for 15 min then rinse.   levothyroxine 25 MCG tablet Commonly known as: SYNTHROID Take 25 mcg by mouth daily before breakfast.   losartan 100 MG tablet Commonly known as: COZAAR Take 100 mg by  mouth daily.   miconazole 2 % cream Commonly known as: MICOTIN Apply 1 application topically as needed (Apply to rash under breasts and abdominal folds as needed).   mineral oil-hydrophilic petrolatum ointment Apply topically as needed for dry skin.   CeraVe Daily Moisturizing Lotn Apply topically daily.   nebivolol 10 MG tablet Commonly known as: Bystolic Take 1 tablet (10 mg total) by mouth daily.   nitroGLYCERIN 0.4 MG SL tablet Commonly known as: NITROSTAT Place 0.4 mg under the tongue every 5 (five) minutes as needed.   ondansetron 4 MG tablet Commonly known as: ZOFRAN Take 4 mg by mouth every 6 (six) hours as needed for nausea or vomiting.   potassium chloride SA 20 MEQ tablet Commonly known as: KLOR-CON M Take 20 mEq by mouth daily.   PRESERVISION AREDS 2+MULTI VIT PO Take by mouth 2 (two) times daily.   Sarna Sensitive 1 % Lotn Generic drug: pramoxine Apply topically. Daily and as needed   Sarna Sensitive 1 % Lotn Generic drug: pramoxine Apply 1 Application topically  every evening.   sodium fluoride 1.1 % Crea dental cream Commonly known as: PREVIDENT 5000 PLUS Place 1 application  onto teeth at bedtime.        Review of Systems  Unable to perform ROS: Dementia    Immunization History  Administered Date(s) Administered   Influenza, High Dose Seasonal PF 02/18/2017, 03/22/2019, 03/14/2020   Influenza,inj,Quad PF,6+ Mos 03/24/2018   Influenza-Unspecified 03/04/2016, 03/18/2020, 02/25/2021, 02/26/2022   Moderna Covid-19 Vaccine Bivalent Booster 4222yrs & up 09/18/2021, 03/29/2022   Moderna SARS-COV2 Booster Vaccination 04/03/2020, 01/13/2021   Moderna Sars-Covid-2 Vaccination 06/04/2019, 07/03/2019, 03/04/2021   Pneumococcal Conjugate-13 05/08/2014   Pneumococcal Polysaccharide-23 10/08/2017   Td 07/09/2002, 01/16/2013   Zoster Recombinat (Shingrix) 11/18/2017, 01/17/2018   Zoster, Live 06/30/2010   Pertinent  Health Maintenance Due  Topic Date Due    INFLUENZA VACCINE  12/23/2022   DEXA SCAN  Completed      07/04/2019    9:05 AM 01/15/2021    1:46 PM 08/18/2021   12:45 PM 04/20/2022    2:25 PM 07/07/2022   10:36 AM  Fall Risk  Falls in the past year? 0  1 0 0  Was there an injury with Fall? 0  0 0 0  Fall Risk Category Calculator 0  1 0 0  Fall Risk Category (Retired) Low  Low Low   (RETIRED) Patient Fall Risk Level Low fall risk Moderate fall risk Moderate fall risk Moderate fall risk   Patient at Risk for Falls Due to  History of fall(s) History of fall(s);Impaired balance/gait;Impaired mobility;Orthopedic patient No Fall Risks;History of fall(s);Impaired balance/gait;Orthopedic patient;Impaired mobility History of fall(s)  Patient at Risk for Falls Due to - Comments   chronic knee pain Chronic knee pain   Fall risk Follow up  Falls evaluation completed;Follow up appointment Education provided;Falls prevention discussed Falls evaluation completed Falls evaluation completed   Functional Status Survey:    Vitals:   08/31/22 1001  BP: 124/75  Pulse: 64  Resp: 20  Temp: (!) 97.5 F (36.4 C)  SpO2: 98%  Weight: 230 lb 9.6 oz (104.6 kg)  Height: 5' (1.524 m)   Body mass index is 45.04 kg/m. Physical Exam Vitals reviewed.  Constitutional:      Appearance: She is obese.  HENT:     Head: Normocephalic.     Right Ear: There is no impacted cerumen.     Left Ear: There is no impacted cerumen.     Nose: Nose normal.     Mouth/Throat:     Mouth: Mucous membranes are moist.  Eyes:     General:        Right eye: No discharge.        Left eye: No discharge.  Cardiovascular:     Rate and Rhythm: Normal rate and regular rhythm.     Pulses:          Dorsalis pedis pulses are 1+ on the right side and 1+ on the left side.       Posterior tibial pulses are 1+ on the right side and 1+ on the left side.     Heart sounds: Normal heart sounds.  Pulmonary:     Effort: Pulmonary effort is normal. No respiratory distress.     Breath  sounds: Normal breath sounds. No wheezing.  Abdominal:     General: Bowel sounds are normal. There is no distension.     Palpations: Abdomen is soft.     Tenderness: There is no abdominal tenderness.  Musculoskeletal:     Cervical back: Neck supple.     Right hip: No deformity or tenderness. Normal range of motion. Normal strength.     Right lower leg: Edema present.     Left lower leg: Edema present.     Comments: Non pitting, ted hose on  Feet:     Right foot:     Skin integrity: Dry skin present.     Toenail Condition: Right toenails are abnormally thick. Fungal disease present.    Left foot:     Skin integrity: Dry skin present.     Toenail Condition: Left toenails are abnormally thick. Fungal disease present. Skin:    General: Skin is warm and dry.     Capillary Refill: Capillary refill takes less than 2 seconds.  Neurological:     General: No focal deficit present.     Mental Status: She is alert. Mental status is at baseline.     Motor: Weakness present.     Gait: Gait abnormal.     Comments: wheelchair  Psychiatric:        Mood and Affect: Mood normal.        Behavior: Behavior normal.     Comments: Very pleasant, follows commands, alert to self and familiar face     Labs reviewed: Recent Labs    01/16/22 0000 07/06/22 0000 07/15/22 0000  NA 143 138 140  K 4.0 4.1 4.0  CL 105 99 102  CO2 27* 22 25*  BUN 13 16 14   CREATININE 0.9 1.0 1.0  CALCIUM 9.1 9.1 9.7   Recent Labs    10/19/21 0000 01/16/22 0000  AST 14  --   ALT 7  --   ALKPHOS 104  --   ALBUMIN 3.5 3.3*   Recent Labs    11/12/21 0000 01/16/22 0000 07/06/22 0000  WBC 8.1 7.6 8.8  HGB 12.1 11.7* 11.6*  HCT 38 34* 37  PLT 213 230 225   Lab Results  Component Value Date   TSH 1.77 07/06/2022   Lab Results  Component Value Date   HGBA1C 6.1 03/01/2019   Lab Results  Component Value Date   CHOL 200 10/18/2019   HDL 44 10/18/2019   LDLCALC 129 10/18/2019   TRIG 137 10/18/2019     Significant Diagnostic Results in last 30 days:  CUP PACEART REMOTE DEVICE CHECK  Result Date: 08/11/2022 Scheduled remote reviewed. Normal device function.  1 AMS, 4sec in duration Next remote 91 days. LA   Assessment/Plan 1. Acute right hip pain - 03/25 mechanical fall - xray right hip/pelvis negative for fracture/dislocation - exam unremarkable - cont tylenol prn  2. Tooth infection - 03/01 right lower mouth pain - completed Augmentin x 7 days - 03/21 tooth to right lower gum abstracted by dentist  3. Chronic diastolic congestive heart failure - no weight fluctuations or sob, non pitting edema present - cont furosemide  - cont weekly weights  4. Vascular dementia without behavioral disturbance - dependent with ADLs except feedings - ambulates with wheelchair - weights stable - cont Aricept  5. Primary hypertension - controlled with losartan and Bystolic  6. AV block - last checked 08/10/2022  7. Venous insufficiency - non pitting edema present - cont ted hose  8. Obstructive sleep apnea on CPAP - cont CPAP qhs  9. Depression, major, recurrent, in partial remission - no mood changes - cont Cymbalta  10. Acquired hypothyroidism - TSH stable - cont Synthroid  11. Breast lump  in female - no further workup per goals of care  12. Onychauxis - long, thick, yellow toenails  - in house podiatry consult    Family/ staff Communication: plan discussed with patient and nurse  Labs/tests ordered: none

## 2022-09-16 ENCOUNTER — Non-Acute Institutional Stay (SKILLED_NURSING_FACILITY): Payer: Medicare Other | Admitting: Adult Health

## 2022-09-16 ENCOUNTER — Encounter: Payer: Self-pay | Admitting: Adult Health

## 2022-09-16 DIAGNOSIS — I872 Venous insufficiency (chronic) (peripheral): Secondary | ICD-10-CM | POA: Diagnosis not present

## 2022-09-16 NOTE — Progress Notes (Signed)
Location[facility] Room Number: 124-A Place of Service:  SNF [phone]) Provider:  Fletcher Anon, NP   Patient Care Team: Mahlon Gammon, MD as PCP - General (Internal Medicine) Quintella Reichert, MD as PCP - Sleep Medicine (Sleep Medicine) Marinus Maw, MD as PCP - Cardiology (Cardiology) Francena Hanly, MD as Consulting Physician (Orthopedic Surgery) Ollen Gross, MD as Consulting Physician (Orthopedic Surgery) Micki Riley, MD as Consulting Physician (Neurology) Marinus Maw, MD as Consulting Physician (Cardiology) Corky Crafts, MD as Consulting Physician (Interventional Cardiology) Mckinley Jewel, MD as Consulting Physician (Ophthalmology) Community, [facility] (Skilled Nursing Facility) Fletcher Anon, NP as Nurse Practitioner (Nurse Practitioner)  Extended Emergency Contact Information Primary Emergency Contact: Moore,JED Address: [address]          [place] Home Phone: [phone] Mobile Phone: [phone] Relation: Son Secondary Emergency Contact: MCGARRY,LEE Mobile Phone: [phone] Relation: Daughter  Code Status:  DNR Goals of care: Advanced Directive information    09/16/2022   11:37 AM  Advanced Directives  Does Patient Have a Medical Advance Directive? Yes  Type of Advance Directive Out of facility DNR (pink MOST or yellow form);Healthcare Power of Attorney  Does patient want to make changes to medical advance directive? No - Patient declined  Copy of Healthcare Power of Attorney in Chart? Yes - validated most recent copy scanned in chart (See row information)  Pre-existing out of facility DNR order (yellow form or pink MOST form) Yellow form placed in chart (order not valid for inpatient use)     Chief Complaint  Patient presents with   Acute Visit    Cellulitis. Discuss need for AWV    HPI:  Pt is a 89 y.o. female seen today for an acute visit  for cellulitis.   The nurse asked me to see her for concerns of erythema to both legs and scaly skin. Ms. Diver has chronic erythema, statis, and venous insuff to both legs ongoing. She is not having any pain or fever. Upon entering the room she is eating candy and states that she feels fine.    Past Medical History:  Diagnosis Date   Breast cancer    Colon polyps    Coronary artery disease 06/24/2006   STEMI inferior with BMS to mid RCA with mid basal inferior wall HK and luminal irrgularities elsewhere   Fracture of femoral neck, right 06/13/2011   Fracture of humeral head, right, closed 06/13/2011   GERD (gastroesophageal reflux disease)    Hemorrhoids    Hyperlipemia    Hypertension    LBBB (left bundle branch block)    Macular degeneration    Myocardial infarction    Obstructive sleep apnea on CPAP    Osteopenia    Osteopenia    Pacemaker 02/25/2014   Rhinitis    Rotator cuff syndrome    SAH (subarachnoid hemorrhage) 02/22/2014   Stokes Adams attack 02/22/2014   Ulnar neuropathy    Umbilical hernia    Unstable gait    Vascular dementia without behavioral disturbance 07/04/2019   MMSE 10/11/19 28/30, 06/08/20 28/30   Wears hearing aid    Per [facility] records   Past Surgical History:  Procedure Laterality Date   BREAST LUMPECTOMY     CATARACT EXTRACTION, BILATERAL     Per [facility]   CORONARY ANGIOPLASTY WITH STENT PLACEMENT  06/2006   Mid RCA BMS   HIP ARTHROPLASTY  06/06/2011   Procedure:  ARTHROPLASTY BIPOLAR HIP;  Surgeon: Loanne Drilling;  Location: [facility];  Service: Orthopedics;  Laterality: Right;   PERMANENT PACEMAKER INSERTION N/A 02/25/2014   Procedure: PERMANENT PACEMAKER INSERTION;  Surgeon: Marinus Maw, MD;  Location: [facility] CATH LAB;  Service: Cardiovascular;  Laterality: N/A;   SHOULDER SURGERY     TONSILLECTOMY     Per [facility]    WRIST SURGERY      Allergies  Allergen Reactions   Chlor-Trimeton [Chlorpheniramine]  Anaphylaxis   Codeine Anaphylaxis   Nystatin Rash    redness   Amlodipine     Per [facility]     Outpatient Encounter Medications as of 09/16/2022  Medication Sig   acetaminophen (TYLENOL) 500 MG tablet Take 500 mg by mouth every 8 (eight) hours as needed.    ammonium lactate (AMLACTIN) 12 % cream Apply 1 Application topically at bedtime.   aspirin EC 81 MG tablet Take 81 mg by mouth daily.   Carboxymethylcellulose Sodium (ARTIFICIAL TEARS OP) Apply 1 drop to eye as needed.   Chlorhexidine Gluconate 4 % SOLN Apply 1 application topically daily. On Tuesday or Friday   cholecalciferol (VITAMIN D) 25 MCG (1000 UNIT) tablet Take 3,000 Units by mouth daily. In the morning between 8-11 am   donepezil (ARICEPT) 10 MG tablet Take 1 tablet (10 mg total) by mouth at bedtime.   DULoxetine (CYMBALTA) 60 MG capsule Take 60 mg by mouth daily.   Emollient (CERAVE DAILY MOISTURIZING) LOTN Apply topically daily.   furosemide (LASIX) 20 MG tablet Take 20 mg by mouth. Daily in the afternoon   furosemide (LASIX) 40 MG tablet Take 40 mg by mouth every morning.   furosemide (LASIX) 40 MG tablet Take 40 mg by mouth as needed for edema or fluid.   KETOCONAZOLE, TOPICAL, 1 % SHAM Apply 1 application  topically 2 (two) times a week. Leave on for 15 min then rinse.   levothyroxine (SYNTHROID, LEVOTHROID) 25 MCG tablet Take 25 mcg by mouth daily before breakfast.    losartan (COZAAR) 100 MG tablet Take 100 mg by mouth daily.   miconazole (MICOTIN) 2 % cream Apply 1 application topically as needed (Apply to rash under breasts and abdominal folds as needed).   mineral oil-hydrophilic petrolatum (AQUAPHOR) ointment Apply topically as needed for dry skin.   Multiple Vitamins-Minerals (PRESERVISION AREDS 2+MULTI VIT PO) Take by mouth 2 (two) times daily.   nebivolol (BYSTOLIC) 10 MG tablet Take 1 tablet (10 mg total) by mouth daily.   nitroGLYCERIN (NITROSTAT) 0.4 MG SL tablet Place 0.4 mg under the tongue  every 5 (five) minutes as needed.   ondansetron (ZOFRAN) 4 MG tablet Take 4 mg by mouth every 6 (six) hours as needed for nausea or vomiting.   potassium chloride SA (K-DUR,KLOR-CON) 20 MEQ tablet Take 20 mEq by mouth daily.    pramoxine (SARNA SENSITIVE) 1 % LOTN Apply topically. Daily and as needed   pramoxine (SARNA SENSITIVE) 1 % LOTN Apply 1 Application topically every evening.   sodium fluoride (PREVIDENT 5000 PLUS) 1.1 % CREA dental cream Place 1 application  onto teeth at bedtime.   UNABLE TO FIND CPAP   vitamin B-12 (CYANOCOBALAMIN) 1000 MCG tablet Take 1,000 mcg by mouth daily.   No facility-administered encounter medications on file as of 09/16/2022.    Review of Systems  Constitutional:  Negative for activity change, appetite change, chills, diaphoresis, fatigue, fever and unexpected weight change.  HENT:  Negative for congestion and mouth sores.  Respiratory:  Negative for cough, shortness of breath and wheezing.   Cardiovascular:  Positive for leg swelling. Negative for chest pain and palpitations.  Gastrointestinal:  Negative for abdominal distention, abdominal pain, constipation and diarrhea.  Genitourinary:  Negative for difficulty urinating and dysuria.  Musculoskeletal:  Positive for gait problem. Negative for arthralgias, back pain, joint swelling and myalgias.  Skin:        Chronic erythema to legs.   Neurological:  Negative for dizziness, tremors, seizures, syncope, facial asymmetry, speech difficulty, weakness, light-headedness, numbness and headaches.  Psychiatric/Behavioral:  Positive for confusion (baseline). Negative for agitation and behavioral problems.     Immunization History  Administered Date(s) Administered   Influenza, High Dose Seasonal PF 02/18/2017, 03/22/2019, 03/14/2020   Influenza,inj,Quad PF,6+ Mos 03/24/2018   Influenza-Unspecified 03/04/2016, 03/18/2020, 02/25/2021, 02/26/2022   Moderna Covid-19 Vaccine Bivalent Booster 81yrs & up 09/18/2021,  03/29/2022   Moderna SARS-COV2 Booster Vaccination 04/03/2020, 01/13/2021   Moderna Sars-Covid-2 Vaccination 06/04/2019, 07/03/2019, 03/04/2021   Pneumococcal Conjugate-13 05/08/2014   Pneumococcal Polysaccharide-23 10/08/2017   Td 07/09/2002, 01/16/2013   Zoster Recombinat (Shingrix) 11/18/2017, 01/17/2018   Zoster, Live 06/30/2010   Pertinent  Health Maintenance Due  Topic Date Due   INFLUENZA VACCINE  12/23/2022   DEXA SCAN  Completed      01/15/2021    1:46 PM 08/18/2021   12:45 PM 04/20/2022    2:25 PM 07/07/2022   10:36 AM 08/31/2022   12:48 PM  Fall Risk  Falls in the past year?  1 0 0 1  Was there an injury with Fall?  0 0 0 1  Fall Risk Category Calculator  1 0 0 2  Fall Risk Category (Retired)  Low Low    (RETIRED) Patient Fall Risk Level Moderate fall risk Moderate fall risk Moderate fall risk    Patient at Risk for Falls Due to History of fall(s) History of fall(s);Impaired balance/gait;Impaired mobility;Orthopedic patient No Fall Risks;History of fall(s);Impaired balance/gait;Orthopedic patient;Impaired mobility History of fall(s) History of fall(s);Impaired balance/gait;Impaired mobility  Patient at Risk for Falls Due to - Comments  chronic knee pain Chronic knee pain    Fall risk Follow up Falls evaluation completed;Follow up appointment Education provided;Falls prevention discussed Falls evaluation completed Falls evaluation completed Falls evaluation completed;Education provided;Falls prevention discussed   Functional Status Survey:    Vitals:   09/16/22 1036  BP: 121/76  Pulse: 67  Resp: 18  Temp: (!) 97.3 F (36.3 C)  SpO2: 96%  Weight: 228 lb (103.4 kg)  Height: 5' (1.524 m)   Body mass index is 44.53 kg/m. Physical Exam Vitals and nursing note reviewed.  Constitutional:      General: She is not in acute distress.    Appearance: She is not diaphoretic.  HENT:     Head: Normocephalic and atraumatic.  Neck:     Vascular: No JVD.  Cardiovascular:      Rate and Rhythm: Normal rate and regular rhythm.     Heart sounds: No murmur heard. Pulmonary:     Effort: Pulmonary effort is normal. No respiratory distress.     Breath sounds: Normal breath sounds. No wheezing.  Musculoskeletal:     Comments: BLE edema +2 with erythema. Scaly plaques to anterior right lower ext.  No warmth no drainage   Skin:    General: Skin is warm and dry.  Neurological:     Mental Status: She is alert and oriented to person, place, and time.     Labs reviewed: Recent Labs  01/16/22 0000 07/06/22 0000 07/15/22 0000  NA 143 138 140  K 4.0 4.1 4.0  CL 105 99 102  CO2 27* 22 25*  BUN 13 16 14   CREATININE 0.9 1.0 1.0  CALCIUM 9.1 9.1 9.7   Recent Labs    10/19/21 0000 01/16/22 0000  AST 14  --   ALT 7  --   ALKPHOS 104  --   ALBUMIN 3.5 3.3*   Recent Labs    11/12/21 0000 01/16/22 0000 07/06/22 0000  WBC 8.1 7.6 8.8  HGB 12.1 11.7* 11.6*  HCT 38 34* 37  PLT 213 230 225   Lab Results  Component Value Date   TSH 1.77 07/06/2022   Lab Results  Component Value Date   HGBA1C 6.1 03/01/2019   Lab Results  Component Value Date   CHOL 200 10/18/2019   HDL 44 10/18/2019   LDLCALC 129 10/18/2019   TRIG 137 10/18/2019    Significant Diagnostic Results in last 30 days:  No results found.  Assessment/Plan  1. Stasis dermatitis of both legs She has chronic erythema to her legs due to venous insuff. This is a chronic problem, not new, and is not bothersome to her. No signs of infection at this time. Monitor for drainage, warmth, fever.   2. Venous insufficiency No change from baseline.  Continue compression hose, leg elevation, and heart healthy diet   Family/ staff Communication: resident  Labs/tests ordered:  NA

## 2022-09-17 ENCOUNTER — Encounter: Payer: Self-pay | Admitting: Adult Health

## 2022-09-20 NOTE — Progress Notes (Signed)
Remote pacemaker transmission.   

## 2022-09-21 DIAGNOSIS — M2041 Other hammer toe(s) (acquired), right foot: Secondary | ICD-10-CM | POA: Diagnosis not present

## 2022-09-21 DIAGNOSIS — L602 Onychogryphosis: Secondary | ICD-10-CM | POA: Diagnosis not present

## 2022-09-21 DIAGNOSIS — M2042 Other hammer toe(s) (acquired), left foot: Secondary | ICD-10-CM | POA: Diagnosis not present

## 2022-09-23 DIAGNOSIS — R4189 Other symptoms and signs involving cognitive functions and awareness: Secondary | ICD-10-CM | POA: Diagnosis not present

## 2022-09-23 DIAGNOSIS — R296 Repeated falls: Secondary | ICD-10-CM | POA: Diagnosis not present

## 2022-09-23 DIAGNOSIS — R1311 Dysphagia, oral phase: Secondary | ICD-10-CM | POA: Diagnosis not present

## 2022-09-23 DIAGNOSIS — M1712 Unilateral primary osteoarthritis, left knee: Secondary | ICD-10-CM | POA: Diagnosis not present

## 2022-09-23 DIAGNOSIS — R293 Abnormal posture: Secondary | ICD-10-CM | POA: Diagnosis not present

## 2022-09-23 DIAGNOSIS — I6031 Nontraumatic subarachnoid hemorrhage from right posterior communicating artery: Secondary | ICD-10-CM | POA: Diagnosis not present

## 2022-09-23 DIAGNOSIS — M6259 Muscle wasting and atrophy, not elsewhere classified, multiple sites: Secondary | ICD-10-CM | POA: Diagnosis not present

## 2022-09-23 DIAGNOSIS — R41841 Cognitive communication deficit: Secondary | ICD-10-CM | POA: Diagnosis not present

## 2022-09-23 DIAGNOSIS — F01B0 Vascular dementia, moderate, without behavioral disturbance, psychotic disturbance, mood disturbance, and anxiety: Secondary | ICD-10-CM | POA: Diagnosis not present

## 2022-09-23 DIAGNOSIS — F4321 Adjustment disorder with depressed mood: Secondary | ICD-10-CM | POA: Diagnosis not present

## 2022-09-23 DIAGNOSIS — R2689 Other abnormalities of gait and mobility: Secondary | ICD-10-CM | POA: Diagnosis not present

## 2022-09-23 DIAGNOSIS — R278 Other lack of coordination: Secondary | ICD-10-CM | POA: Diagnosis not present

## 2022-09-27 DIAGNOSIS — F01B0 Vascular dementia, moderate, without behavioral disturbance, psychotic disturbance, mood disturbance, and anxiety: Secondary | ICD-10-CM | POA: Diagnosis not present

## 2022-09-27 DIAGNOSIS — M6259 Muscle wasting and atrophy, not elsewhere classified, multiple sites: Secondary | ICD-10-CM | POA: Diagnosis not present

## 2022-09-27 DIAGNOSIS — R293 Abnormal posture: Secondary | ICD-10-CM | POA: Diagnosis not present

## 2022-09-27 DIAGNOSIS — I6031 Nontraumatic subarachnoid hemorrhage from right posterior communicating artery: Secondary | ICD-10-CM | POA: Diagnosis not present

## 2022-09-27 DIAGNOSIS — F4321 Adjustment disorder with depressed mood: Secondary | ICD-10-CM | POA: Diagnosis not present

## 2022-09-27 DIAGNOSIS — M1712 Unilateral primary osteoarthritis, left knee: Secondary | ICD-10-CM | POA: Diagnosis not present

## 2022-09-28 ENCOUNTER — Encounter: Payer: Self-pay | Admitting: Orthopedic Surgery

## 2022-09-28 ENCOUNTER — Non-Acute Institutional Stay (SKILLED_NURSING_FACILITY): Payer: Medicare Other | Admitting: Orthopedic Surgery

## 2022-09-28 DIAGNOSIS — I5032 Chronic diastolic (congestive) heart failure: Secondary | ICD-10-CM | POA: Diagnosis not present

## 2022-09-28 DIAGNOSIS — F4321 Adjustment disorder with depressed mood: Secondary | ICD-10-CM | POA: Diagnosis not present

## 2022-09-28 DIAGNOSIS — E039 Hypothyroidism, unspecified: Secondary | ICD-10-CM

## 2022-09-28 DIAGNOSIS — I10 Essential (primary) hypertension: Secondary | ICD-10-CM

## 2022-09-28 DIAGNOSIS — G4733 Obstructive sleep apnea (adult) (pediatric): Secondary | ICD-10-CM | POA: Diagnosis not present

## 2022-09-28 DIAGNOSIS — R293 Abnormal posture: Secondary | ICD-10-CM | POA: Diagnosis not present

## 2022-09-28 DIAGNOSIS — I4430 Unspecified atrioventricular block: Secondary | ICD-10-CM

## 2022-09-28 DIAGNOSIS — M1712 Unilateral primary osteoarthritis, left knee: Secondary | ICD-10-CM | POA: Diagnosis not present

## 2022-09-28 DIAGNOSIS — F3341 Major depressive disorder, recurrent, in partial remission: Secondary | ICD-10-CM | POA: Diagnosis not present

## 2022-09-28 DIAGNOSIS — F01B0 Vascular dementia, moderate, without behavioral disturbance, psychotic disturbance, mood disturbance, and anxiety: Secondary | ICD-10-CM | POA: Diagnosis not present

## 2022-09-28 DIAGNOSIS — M6259 Muscle wasting and atrophy, not elsewhere classified, multiple sites: Secondary | ICD-10-CM | POA: Diagnosis not present

## 2022-09-28 DIAGNOSIS — I872 Venous insufficiency (chronic) (peripheral): Secondary | ICD-10-CM

## 2022-09-28 DIAGNOSIS — F0150 Vascular dementia without behavioral disturbance: Secondary | ICD-10-CM | POA: Diagnosis not present

## 2022-09-28 DIAGNOSIS — I6031 Nontraumatic subarachnoid hemorrhage from right posterior communicating artery: Secondary | ICD-10-CM | POA: Diagnosis not present

## 2022-09-28 DIAGNOSIS — I700 Atherosclerosis of aorta: Secondary | ICD-10-CM | POA: Diagnosis not present

## 2022-09-28 DIAGNOSIS — N1831 Chronic kidney disease, stage 3a: Secondary | ICD-10-CM | POA: Diagnosis not present

## 2022-09-28 NOTE — Progress Notes (Signed)
[facility] Room Number: 124-A Place of Service:  SNF [phone]) Provider:  Hazle Nordmann, NP  PCP: Mahlon Gammon, MD  Patient Care Team: Mahlon Gammon, MD as PCP - General (Internal Medicine) Quintella Reichert, MD as PCP - Sleep Medicine (Sleep Medicine) Marinus Maw, MD as PCP - Cardiology (Cardiology) Francena Hanly, MD as Consulting Physician (Orthopedic Surgery) Ollen Gross, MD as Consulting Physician (Orthopedic Surgery) Micki Riley, MD as Consulting Physician (Neurology) Marinus Maw, MD as Consulting Physician (Cardiology) Corky Crafts, MD as Consulting Physician (Interventional Cardiology) Mckinley Jewel, MD as Consulting Physician (Ophthalmology) Community, [facility] (Skilled Nursing Facility) Fletcher Anon, NP as Nurse Practitioner (Nurse Practitioner)  Extended Emergency Contact Information Primary Emergency Contact: Peel,JED Address: [address]          [place] Home Phone: [phone] Mobile Phone: [phone] Relation: Son Secondary Emergency Contact: MCGARRY,LEE Mobile Phone: [phone] Relation: Daughter  Code Status:  DNR Goals of care: Advanced Directive information    09/28/2022   10:17 AM  Advanced Directives  Does Patient Have a Medical Advance Directive? Yes  Type of Advance Directive Out of facility DNR (pink MOST or yellow form);Healthcare Power of Attorney  Does patient want to make changes to medical advance directive? No - Patient declined  Copy of Healthcare Power of Attorney in Chart? Yes - validated most recent copy scanned in chart (See row information)     Chief Complaint  Patient presents with   Medical Management of Chronic Issues    Routine Visit.    Health Maintenance    Discuss the need for annual wellness visit.    HPI:  Pt is a 89 y.o. [place] seen today for medical management of chronic diseases.    She  continues to reside on the skilled nursing unit at [facility]. PMH: aortic insufficiency, diastolic heart failure, pacemaker, CAD, HTN, MI, OSA on CPAP, GERD, hypothyroidism, obesity, h/o breast cancer and unstable gait.    Chronic CHF- 02/12 CXR revealed mild CHF/pulmonary edema vs interstitial pneumonitis> resolved with increased furosemide, remains on furosemide and weekly weights Vascular dementia- MMSE 28/30 02/2022, CT head 2019 noted moderate atrophy with moderate small vessel ischemic changes of white matter, dependent with ADLs except feeding, ambulates in wheelchair, remains on donepezil   HTN- BUN/creat BUN/creat 16/1.0 07/06/2022, remains on losartan and Bystolic AV block- s/p pacemaker- checked 08/10/2022 Venous insufficiency- see above OSA- CPAP qhs Depression- attends activities often, goof family support, remains on Cymbalta, Na + 138 07/06/2022 Hypothyroidism- TSH 1.77 (02/13)> was 4.37 10/19/2021, remains on levothyroxine   No recent falls or injuries.   Recent blood pressures:  05/07- 116/69  04/09- 121/76  03/28- 124/75  Recent weights:  05/01- 230.2 lbs  04/01- 231.4 lbs  03/06- 224.4 lbs   10/2021- 235.8 lbs     Past Medical History:  Diagnosis Date   Breast cancer (HCC)    Colon polyps    Coronary artery disease 06/24/2006   STEMI inferior with BMS to mid RCA with mid basal inferior wall HK and luminal irrgularities elsewhere   Fracture of femoral neck, right (HCC) 06/13/2011   Fracture of humeral head, right, closed 06/13/2011   GERD (gastroesophageal reflux disease)    Hemorrhoids    Hyperlipemia    Hypertension    LBBB (left bundle branch block)    Macular degeneration    Myocardial infarction (HCC)    Obstructive sleep apnea  on CPAP    Osteopenia    Osteopenia    Pacemaker 02/25/2014   Rhinitis    Rotator cuff syndrome    SAH (subarachnoid hemorrhage) (HCC) 02/22/2014   Stokes Adams attack 02/22/2014   Ulnar neuropathy    Umbilical hernia     Unstable gait    Vascular dementia without behavioral disturbance (HCC) 07/04/2019   MMSE 10/11/19 28/30, 06/08/20 28/30   Wears hearing aid    Per [facility] records   Past Surgical History:  Procedure Laterality Date   BREAST LUMPECTOMY     CATARACT EXTRACTION, BILATERAL     Per [facility]   CORONARY ANGIOPLASTY WITH STENT PLACEMENT  06/2006   Mid RCA BMS   HIP ARTHROPLASTY  06/06/2011   Procedure: ARTHROPLASTY BIPOLAR HIP;  Surgeon: Loanne Drilling;  Location: [facility] ORS;  Service: Orthopedics;  Laterality: Right;   PERMANENT PACEMAKER INSERTION N/A 02/25/2014   Procedure: PERMANENT PACEMAKER INSERTION;  Surgeon: Marinus Maw, MD;  Location: [facility] CATH LAB;  Service: Cardiovascular;  Laterality: N/A;   SHOULDER SURGERY     TONSILLECTOMY     Per [facility]    WRIST SURGERY      Allergies  Allergen Reactions   Chlor-Trimeton [Chlorpheniramine] Anaphylaxis   Codeine Anaphylaxis   Nystatin Rash    redness   Amlodipine     Per [facility]     Allergies as of 09/28/2022       Reactions   Chlor-trimeton [chlorpheniramine] Anaphylaxis   Codeine Anaphylaxis   Nystatin Rash   redness   Amlodipine    Per [facility]         Medication List        Accurate as of Sep 28, 2022 10:17 AM. If you have any questions, ask your nurse or doctor.          acetaminophen 500 MG tablet Commonly known as: TYLENOL Take 500 mg by mouth every 8 (eight) hours as needed.   ammonium lactate 12 % cream Commonly known as: AMLACTIN Apply 1 Application topically at bedtime.   ARTIFICIAL TEARS OP Apply 1 drop to eye as needed.   aspirin EC 81 MG tablet Take 81 mg by mouth daily.   chlorhexidine 4 % external liquid Commonly known as: HIBICLENS Apply 1 application topically daily. On Tuesday or Friday   cholecalciferol 25 MCG (1000 UNIT) tablet Commonly known as: VITAMIN D3 Take 3,000 Units by mouth daily. In the morning between 8-11  am   cyanocobalamin 1000 MCG tablet Commonly known as: VITAMIN B12 Take 1,000 mcg by mouth daily.   donepezil 10 MG tablet Commonly known as: Aricept Take 1 tablet (10 mg total) by mouth at bedtime.   DULoxetine 60 MG capsule Commonly known as: CYMBALTA Take 60 mg by mouth daily.   furosemide 40 MG tablet Commonly known as: LASIX Take 40 mg by mouth every morning.   furosemide 40 MG tablet Commonly known as: LASIX Take 40 mg by mouth as needed for edema or fluid.   furosemide 20 MG tablet Commonly known as: LASIX Take 20 mg by mouth. Daily in the afternoon   KETOCONAZOLE (TOPICAL) 1 % Sham Apply 1 application  topically 2 (two) times a week. Leave on for 15 min then rinse.   levothyroxine 25 MCG tablet Commonly known as: SYNTHROID Take 25 mcg by mouth daily before breakfast.   losartan 100 MG tablet Commonly known as: COZAAR Take 100 mg by mouth daily.  miconazole 2 % cream Commonly known as: MICOTIN Apply 1 application topically as needed (Apply to rash under breasts and abdominal folds as needed).   mineral oil-hydrophilic petrolatum ointment Apply topically as needed for dry skin.   CeraVe Daily Moisturizing Lotn Apply topically daily.   nebivolol 10 MG tablet Commonly known as: Bystolic Take 1 tablet (10 mg total) by mouth daily.   nitroGLYCERIN 0.4 MG SL tablet Commonly known as: NITROSTAT Place 0.4 mg under the tongue every 5 (five) minutes as needed.   ondansetron 4 MG tablet Commonly known as: ZOFRAN Take 4 mg by mouth every 6 (six) hours as needed for nausea or vomiting.   potassium chloride SA 20 MEQ tablet Commonly known as: KLOR-CON M Take 20 mEq by mouth daily.   PRESERVISION AREDS 2+MULTI VIT PO Take by mouth 2 (two) times daily.   Sarna Sensitive 1 % Lotn Generic drug: pramoxine Apply topically. Daily and as needed   Sarna Sensitive 1 % Lotn Generic drug: pramoxine Apply 1 Application topically every evening.   sodium fluoride  1.1 % Crea dental cream Commonly known as: PREVIDENT 5000 PLUS Place 1 application  onto teeth at bedtime.   UNABLE TO FIND CPAP        Review of Systems  Unable to perform ROS: Dementia    Immunization History  Administered Date(s) Administered   Influenza, High Dose Seasonal PF 02/18/2017, 03/22/2019, 03/14/2020   Influenza,inj,Quad PF,6+ Mos 03/24/2018   Influenza-Unspecified 03/04/2016, 03/18/2020, 02/25/2021, 02/26/2022   Moderna Covid-19 Vaccine Bivalent Booster 16yrs & up 09/18/2021, 03/29/2022   Moderna SARS-COV2 Booster Vaccination 04/03/2020, 01/13/2021   Moderna Sars-Covid-2 Vaccination 06/04/2019, 07/03/2019, 03/04/2021   Pneumococcal Conjugate-13 05/08/2014   Pneumococcal Polysaccharide-23 05/Rachel/2019   Td 07/09/2002, 01/16/2013   Zoster Recombinat (Shingrix) 11/18/2017, 01/17/2018   Zoster, Live 06/30/2010   Pertinent  Health Maintenance Due  Topic Date Due   INFLUENZA VACCINE  12/23/2022   DEXA SCAN  Completed      01/15/2021    1:46 PM 08/18/2021   12:45 PM 04/20/2022    2:25 PM 07/07/2022   10:36 AM 08/31/2022   12:48 PM  Fall Risk  Falls in the past year?  1 0 0 1  Was there an injury with Fall?  0 0 0 1  Fall Risk Category Calculator  1 0 0 2  Fall Risk Category (Retired)  Low Low    (RETIRED) Patient Fall Risk Level Moderate fall risk Moderate fall risk Moderate fall risk    Patient at Risk for Falls Due to History of fall(s) History of fall(s);Impaired balance/gait;Impaired mobility;Orthopedic patient No Fall Risks;History of fall(s);Impaired balance/gait;Orthopedic patient;Impaired mobility History of fall(s) History of fall(s);Impaired balance/gait;Impaired mobility  Patient at Risk for Falls Due to - Comments  chronic knee pain Chronic knee pain    Fall risk Follow up Falls evaluation completed;Follow up appointment Education provided;Falls prevention discussed Falls evaluation completed Falls evaluation completed Falls evaluation completed;Education  provided;Falls prevention discussed   Functional Status Survey:    Vitals:   09/28/22 1011  BP: 121/76  Pulse: 67  Resp: Rachel  Temp: (!) 97.3 F (36.3 C)  SpO2: 96%  Weight: 230 lb 3.2 oz (104.4 kg)  Height: 5' (1.524 m)   Body mass index is 44.96 kg/m. Physical Exam Vitals reviewed.  Constitutional:      General: She is not in acute distress.    Appearance: She is obese.  HENT:     Head: Normocephalic.     Right Ear:  There is no impacted cerumen.     Left Ear: There is no impacted cerumen.     Nose: Nose normal.     Mouth/Throat:     Mouth: Mucous membranes are moist.  Eyes:     General:        Right eye: No discharge.        Left eye: No discharge.  Cardiovascular:     Rate and Rhythm: Normal rate and regular rhythm.     Pulses: Normal pulses.     Heart sounds: Normal heart sounds.  Pulmonary:     Effort: Pulmonary effort is normal. No respiratory distress.     Breath sounds: Normal breath sounds. No wheezing or rales.  Abdominal:     General: Bowel sounds are normal.     Palpations: Abdomen is soft.  Musculoskeletal:     Cervical back: Neck supple.     Right lower leg: Edema present.     Left lower leg: Edema present.     Comments: Non pitting, compression hose on   Skin:    General: Skin is warm.     Capillary Refill: Capillary refill takes less than 2 seconds.  Neurological:     General: No focal deficit present.     Mental Status: She is alert. Mental status is at baseline.     Motor: Weakness present.     Gait: Gait abnormal.  Psychiatric:        Mood and Affect: Mood normal.     Labs reviewed: Recent Labs    01/16/22 0000 07/06/22 0000 07/15/22 0000  NA 143 138 140  K 4.0 4.1 4.0  CL 105 99 102  CO2 27* 22 25*  BUN 13 16 14   CREATININE 0.9 1.0 1.0  CALCIUM 9.1 9.1 9.7   Recent Labs    10/19/21 0000 01/16/22 0000  AST 14  --   ALT 7  --   ALKPHOS 104  --   ALBUMIN 3.5 3.3*   Recent Labs    11/12/21 0000 01/16/22 0000  07/06/22 0000  WBC 8.1 7.6 8.8  HGB 12.1 11.7* 11.6*  HCT 38 34* 37  PLT 213 230 225   Lab Results  Component Value Date   TSH 1.77 07/06/2022   Lab Results  Component Value Date   HGBA1C 6.1 03/01/2019   Lab Results  Component Value Date   CHOL 200 10/18/2019   HDL 44 10/18/2019   LDLCALC 129 10/18/2019   TRIG 137 10/18/2019    Significant Diagnostic Results in last 30 days:  No results found.  Assessment/Plan 1. Chronic diastolic congestive heart failure (HCC) - mild non pitting edema present - cont furosemide - cont weekly weights  2. Vascular dementia without behavioral disturbance (HCC) - no agitation - dependent with ADLs except feeding - weight stable - cont skilled nursing - cont Aricept  3. Primary hypertension - controlled with losartan and Bystolic  4. AV block - last checked 07/2022  5. Venous insufficiency - non pitting - cont compression hose  6. Obstructive sleep apnea on CPAP - cont CPAP  7. Depression, major, recurrent, in partial remission (HCC) - no mood changes - good family support - cont Cymbalta  8. Acquired hypothyroidism - TSH stable - cont levothyroxine  9. Aortic atherosclerosis (HCC)  10. Stage 3a chronic kidney disease (HCC) - GFR 51 07/15/2022 - on furosemide - encourage hydration with water - avoid NSAIDS    Family/ staff Communication: plan discussed with patient and  nurse  Labs/tests ordered:  none

## 2022-09-30 DIAGNOSIS — R293 Abnormal posture: Secondary | ICD-10-CM | POA: Diagnosis not present

## 2022-09-30 DIAGNOSIS — F01B0 Vascular dementia, moderate, without behavioral disturbance, psychotic disturbance, mood disturbance, and anxiety: Secondary | ICD-10-CM | POA: Diagnosis not present

## 2022-09-30 DIAGNOSIS — F4321 Adjustment disorder with depressed mood: Secondary | ICD-10-CM | POA: Diagnosis not present

## 2022-09-30 DIAGNOSIS — M6259 Muscle wasting and atrophy, not elsewhere classified, multiple sites: Secondary | ICD-10-CM | POA: Diagnosis not present

## 2022-09-30 DIAGNOSIS — M1712 Unilateral primary osteoarthritis, left knee: Secondary | ICD-10-CM | POA: Diagnosis not present

## 2022-09-30 DIAGNOSIS — I6031 Nontraumatic subarachnoid hemorrhage from right posterior communicating artery: Secondary | ICD-10-CM | POA: Diagnosis not present

## 2022-10-04 DIAGNOSIS — R293 Abnormal posture: Secondary | ICD-10-CM | POA: Diagnosis not present

## 2022-10-04 DIAGNOSIS — M1712 Unilateral primary osteoarthritis, left knee: Secondary | ICD-10-CM | POA: Diagnosis not present

## 2022-10-04 DIAGNOSIS — F4321 Adjustment disorder with depressed mood: Secondary | ICD-10-CM | POA: Diagnosis not present

## 2022-10-04 DIAGNOSIS — I6031 Nontraumatic subarachnoid hemorrhage from right posterior communicating artery: Secondary | ICD-10-CM | POA: Diagnosis not present

## 2022-10-04 DIAGNOSIS — F01B0 Vascular dementia, moderate, without behavioral disturbance, psychotic disturbance, mood disturbance, and anxiety: Secondary | ICD-10-CM | POA: Diagnosis not present

## 2022-10-04 DIAGNOSIS — M6259 Muscle wasting and atrophy, not elsewhere classified, multiple sites: Secondary | ICD-10-CM | POA: Diagnosis not present

## 2022-10-05 DIAGNOSIS — M1712 Unilateral primary osteoarthritis, left knee: Secondary | ICD-10-CM | POA: Diagnosis not present

## 2022-10-05 DIAGNOSIS — M6259 Muscle wasting and atrophy, not elsewhere classified, multiple sites: Secondary | ICD-10-CM | POA: Diagnosis not present

## 2022-10-05 DIAGNOSIS — R293 Abnormal posture: Secondary | ICD-10-CM | POA: Diagnosis not present

## 2022-10-05 DIAGNOSIS — F01B0 Vascular dementia, moderate, without behavioral disturbance, psychotic disturbance, mood disturbance, and anxiety: Secondary | ICD-10-CM | POA: Diagnosis not present

## 2022-10-05 DIAGNOSIS — F4321 Adjustment disorder with depressed mood: Secondary | ICD-10-CM | POA: Diagnosis not present

## 2022-10-05 DIAGNOSIS — I6031 Nontraumatic subarachnoid hemorrhage from right posterior communicating artery: Secondary | ICD-10-CM | POA: Diagnosis not present

## 2022-10-08 DIAGNOSIS — F01B0 Vascular dementia, moderate, without behavioral disturbance, psychotic disturbance, mood disturbance, and anxiety: Secondary | ICD-10-CM | POA: Diagnosis not present

## 2022-10-08 DIAGNOSIS — I6031 Nontraumatic subarachnoid hemorrhage from right posterior communicating artery: Secondary | ICD-10-CM | POA: Diagnosis not present

## 2022-10-08 DIAGNOSIS — M6259 Muscle wasting and atrophy, not elsewhere classified, multiple sites: Secondary | ICD-10-CM | POA: Diagnosis not present

## 2022-10-08 DIAGNOSIS — R293 Abnormal posture: Secondary | ICD-10-CM | POA: Diagnosis not present

## 2022-10-08 DIAGNOSIS — M1712 Unilateral primary osteoarthritis, left knee: Secondary | ICD-10-CM | POA: Diagnosis not present

## 2022-10-08 DIAGNOSIS — F4321 Adjustment disorder with depressed mood: Secondary | ICD-10-CM | POA: Diagnosis not present

## 2022-10-10 DIAGNOSIS — M1712 Unilateral primary osteoarthritis, left knee: Secondary | ICD-10-CM | POA: Diagnosis not present

## 2022-10-10 DIAGNOSIS — I6031 Nontraumatic subarachnoid hemorrhage from right posterior communicating artery: Secondary | ICD-10-CM | POA: Diagnosis not present

## 2022-10-10 DIAGNOSIS — F01B0 Vascular dementia, moderate, without behavioral disturbance, psychotic disturbance, mood disturbance, and anxiety: Secondary | ICD-10-CM | POA: Diagnosis not present

## 2022-10-10 DIAGNOSIS — F4321 Adjustment disorder with depressed mood: Secondary | ICD-10-CM | POA: Diagnosis not present

## 2022-10-10 DIAGNOSIS — R293 Abnormal posture: Secondary | ICD-10-CM | POA: Diagnosis not present

## 2022-10-10 DIAGNOSIS — M6259 Muscle wasting and atrophy, not elsewhere classified, multiple sites: Secondary | ICD-10-CM | POA: Diagnosis not present

## 2022-10-11 DIAGNOSIS — M6259 Muscle wasting and atrophy, not elsewhere classified, multiple sites: Secondary | ICD-10-CM | POA: Diagnosis not present

## 2022-10-11 DIAGNOSIS — F01B0 Vascular dementia, moderate, without behavioral disturbance, psychotic disturbance, mood disturbance, and anxiety: Secondary | ICD-10-CM | POA: Diagnosis not present

## 2022-10-11 DIAGNOSIS — I6031 Nontraumatic subarachnoid hemorrhage from right posterior communicating artery: Secondary | ICD-10-CM | POA: Diagnosis not present

## 2022-10-11 DIAGNOSIS — R293 Abnormal posture: Secondary | ICD-10-CM | POA: Diagnosis not present

## 2022-10-11 DIAGNOSIS — F4321 Adjustment disorder with depressed mood: Secondary | ICD-10-CM | POA: Diagnosis not present

## 2022-10-11 DIAGNOSIS — M1712 Unilateral primary osteoarthritis, left knee: Secondary | ICD-10-CM | POA: Diagnosis not present

## 2022-10-20 DIAGNOSIS — I6031 Nontraumatic subarachnoid hemorrhage from right posterior communicating artery: Secondary | ICD-10-CM | POA: Diagnosis not present

## 2022-10-20 DIAGNOSIS — M6259 Muscle wasting and atrophy, not elsewhere classified, multiple sites: Secondary | ICD-10-CM | POA: Diagnosis not present

## 2022-10-20 DIAGNOSIS — F01B0 Vascular dementia, moderate, without behavioral disturbance, psychotic disturbance, mood disturbance, and anxiety: Secondary | ICD-10-CM | POA: Diagnosis not present

## 2022-10-20 DIAGNOSIS — M1712 Unilateral primary osteoarthritis, left knee: Secondary | ICD-10-CM | POA: Diagnosis not present

## 2022-10-20 DIAGNOSIS — R293 Abnormal posture: Secondary | ICD-10-CM | POA: Diagnosis not present

## 2022-10-20 DIAGNOSIS — F4321 Adjustment disorder with depressed mood: Secondary | ICD-10-CM | POA: Diagnosis not present

## 2022-10-22 DIAGNOSIS — F01B0 Vascular dementia, moderate, without behavioral disturbance, psychotic disturbance, mood disturbance, and anxiety: Secondary | ICD-10-CM | POA: Diagnosis not present

## 2022-10-22 DIAGNOSIS — M6259 Muscle wasting and atrophy, not elsewhere classified, multiple sites: Secondary | ICD-10-CM | POA: Diagnosis not present

## 2022-10-22 DIAGNOSIS — M1712 Unilateral primary osteoarthritis, left knee: Secondary | ICD-10-CM | POA: Diagnosis not present

## 2022-10-22 DIAGNOSIS — F4321 Adjustment disorder with depressed mood: Secondary | ICD-10-CM | POA: Diagnosis not present

## 2022-10-22 DIAGNOSIS — R293 Abnormal posture: Secondary | ICD-10-CM | POA: Diagnosis not present

## 2022-10-22 DIAGNOSIS — I6031 Nontraumatic subarachnoid hemorrhage from right posterior communicating artery: Secondary | ICD-10-CM | POA: Diagnosis not present

## 2022-10-25 ENCOUNTER — Non-Acute Institutional Stay (SKILLED_NURSING_FACILITY): Payer: Medicare Other | Admitting: Internal Medicine

## 2022-10-25 DIAGNOSIS — F0150 Vascular dementia without behavioral disturbance: Secondary | ICD-10-CM | POA: Diagnosis not present

## 2022-10-25 DIAGNOSIS — I10 Essential (primary) hypertension: Secondary | ICD-10-CM

## 2022-10-25 DIAGNOSIS — I5032 Chronic diastolic (congestive) heart failure: Secondary | ICD-10-CM | POA: Diagnosis not present

## 2022-10-25 DIAGNOSIS — Z950 Presence of cardiac pacemaker: Secondary | ICD-10-CM

## 2022-10-25 DIAGNOSIS — N1831 Chronic kidney disease, stage 3a: Secondary | ICD-10-CM

## 2022-10-25 DIAGNOSIS — F3341 Major depressive disorder, recurrent, in partial remission: Secondary | ICD-10-CM | POA: Diagnosis not present

## 2022-10-25 DIAGNOSIS — G4733 Obstructive sleep apnea (adult) (pediatric): Secondary | ICD-10-CM

## 2022-10-25 DIAGNOSIS — E039 Hypothyroidism, unspecified: Secondary | ICD-10-CM | POA: Diagnosis not present

## 2022-10-25 NOTE — Progress Notes (Signed)
Provider:   Location:  Medical illustrator of Service:  SNF (31)  PCP: Mahlon Gammon, MD Patient Care Team: Mahlon Gammon, MD as PCP - General (Internal Medicine) Quintella Reichert, MD as PCP - Sleep Medicine (Sleep Medicine) Marinus Maw, MD as PCP - Cardiology (Cardiology) Francena Hanly, MD as Consulting Physician (Orthopedic Surgery) Ollen Gross, MD as Consulting Physician (Orthopedic Surgery) Micki Riley, MD as Consulting Physician (Neurology) Marinus Maw, MD as Consulting Physician (Cardiology) Corky Crafts, MD as Consulting Physician (Interventional Cardiology) Mckinley Jewel, MD as Consulting Physician (Ophthalmology) Community, [facility] (Skilled Nursing Facility) Fletcher Anon, NP as Nurse Practitioner (Nurse Practitioner)  Extended Emergency Contact Information Primary Emergency Contact: Tarr,JED Address: [address]          [place] Home Phone: [phone] Mobile Phone: [phone] Relation: Son Secondary Emergency Contact: MCGARRY,LEE Mobile Phone: [phone] Relation: Daughter  Code Status: DNR Goals of Care: Advanced Directive information    10/29/2022   10:34 AM  Advanced Directives  Does Patient Have a Medical Advance Directive? Yes  Type of Advance Directive Out of facility DNR (pink MOST or yellow form);Healthcare Power of Attorney  Does patient want to make changes to medical advance directive? No - Patient declined  Copy of Healthcare Power of Attorney in Chart? Yes - validated most recent copy scanned in chart (See row information)      Chief Complaint  Patient presents with   Chronic Care Management    HPI: Patient is a 89 y.o. female seen today for chronic care Lives in SNF in [place]   Patient has h/o Chronic Diastolic CHF, Urinary Incontinence, HTN, Osteoporosis, HLD, B 12 Def, anemia, H/o  Breast cancer and Vascular Dementia  h/o SAH and  OSA Has h/o Breast lump No Work up per her request    Continues to be pleasantly confused Wheelchair dependent No behaviors noticed Feeds herself No Falls Wt Readings from Last 3 Encounters:  10/29/22 224 lb 12.8 oz (102 kg)  10/27/22 224 lb 6.4 oz (101.8 kg)  10/25/22 224 lb 12.8 oz (102 kg)             Past Medical History:  Diagnosis Date   Breast cancer (HCC)    Colon polyps    Coronary artery disease 06/24/2006   STEMI inferior with BMS to mid RCA with mid basal inferior wall HK and luminal irrgularities elsewhere   Fracture of femoral neck, right (HCC) 06/13/2011   Fracture of humeral head, right, closed 06/13/2011   GERD (gastroesophageal reflux disease)    Hemorrhoids    Hyperlipemia    Hypertension    LBBB (left bundle branch block)    Macular degeneration    Myocardial infarction (HCC)    Obstructive sleep apnea on CPAP    Osteopenia    Osteopenia    Pacemaker 02/25/2014   Rhinitis    Rotator cuff syndrome    SAH (subarachnoid hemorrhage) (HCC) 02/22/2014   Stokes Adams attack 02/22/2014   Ulnar neuropathy    Umbilical hernia    Unstable gait    Vascular dementia without behavioral disturbance (HCC) 07/04/2019   MMSE 10/11/19 28/30, 06/08/20 28/30   Wears hearing aid    Per [facility] records   Past Surgical History:  Procedure Laterality Date   BREAST LUMPECTOMY     CATARACT EXTRACTION, BILATERAL     Per [facility]   CORONARY ANGIOPLASTY WITH STENT PLACEMENT  06/2006   Mid RCA BMS   HIP ARTHROPLASTY  06/06/2011   Procedure: ARTHROPLASTY BIPOLAR HIP;  Surgeon: Loanne Drilling;  Location: [facility] ORS;  Service: Orthopedics;  Laterality: Right;   PERMANENT PACEMAKER INSERTION N/A 02/25/2014   Procedure: PERMANENT PACEMAKER INSERTION;  Surgeon: Marinus Maw, MD;  Location: [facility] CATH LAB;  Service: Cardiovascular;  Laterality: N/A;   SHOULDER SURGERY     TONSILLECTOMY     Per [facility]    WRIST SURGERY      reports that she has  never smoked. She has never used smokeless tobacco. She reports current alcohol use. She reports that she does not use drugs. Social History   Socioeconomic History   Marital status: Married    Spouse name: Not on file   Number of children: Not on file   Years of education: Not on file   Highest education level: Not on file  Occupational History   Not on file  Tobacco Use   Smoking status: Never   Smokeless tobacco: Never  Vaping Use   Vaping Use: Never used  Substance and Sexual Activity   Alcohol use: Yes    Alcohol/week: 0.0 standard drinks of alcohol    Comment: occasional wine   Drug use: Never   Sexual activity: Not on file  Other Topics Concern   Not on file  Social History Narrative   Not on file   Social Determinants of Health   Financial Resource Strain: Low Risk  (10/27/2022)   Overall Financial Resource Strain (CARDIA)    Difficulty of Paying Living Expenses: Not hard at all  Food Insecurity: No Food Insecurity (10/27/2022)   Hunger Vital Sign    Worried About Running Out of Food in the Last Year: Never true    Ran Out of Food in the Last Year: Never true  Transportation Needs: No Transportation Needs (10/27/2022)   PRAPARE - Administrator, Civil Service (Medical): No    Lack of Transportation (Non-Medical): No  Physical Activity: Inactive (08/18/2021)   Exercise Vital Sign    Days of Exercise per Week: 0 days    Minutes of Exercise per Session: 0 min  Stress: No Stress Concern Present (10/27/2022)   [employer] of Occupational Health - Occupational Stress Questionnaire    Feeling of Stress : Only a little  Social Connections: Unknown (10/27/2022)   Social Connection and Isolation Panel [NHANES]    Frequency of Communication with Friends and Family: Patient unable to answer    Frequency of Social Gatherings with Friends and Family: Patient unable to answer    Attends Religious Services: Patient unable to answer    Active Member of Clubs or  Organizations: Patient unable to answer    Attends Banker Meetings: Patient unable to answer    Marital Status: Not on file  Intimate Partner Violence: Not At Risk (10/27/2022)   Humiliation, Afraid, Rape, and Kick questionnaire    Fear of Current or Ex-Partner: No    Emotionally Abused: No    Physically Abused: No    Sexually Abused: No    Functional Status Survey:    Family History  Problem Relation Age of Onset   Heart attack Father 80   CAD Father    CVA Sister     Health Maintenance  Topic Date Due   COVID-19 Vaccine (8 - 2023-24 season) 01/21/2023 (Originally 05/24/2022)   INFLUENZA VACCINE  12/23/2022   DTaP/Tdap/Td (3 - Tdap) 01/17/2023  Medicare Annual Wellness (AWV)  10/27/2023   Pneumonia Vaccine 74+ Years old  Completed   DEXA SCAN  Completed   Zoster Vaccines- Shingrix  Completed   HPV VACCINES  Aged Out    Allergies  Allergen Reactions   Chlor-Trimeton [Chlorpheniramine] Anaphylaxis   Codeine Anaphylaxis   Nystatin Rash    redness   Amlodipine     Per [facility]     Outpatient Encounter Medications as of 10/25/2022  Medication Sig   acetaminophen (TYLENOL) 500 MG tablet Take 500 mg by mouth every 8 (eight) hours as needed.    ammonium lactate (AMLACTIN) 12 % cream Apply 1 Application topically at bedtime.   aspirin EC 81 MG tablet Take 81 mg by mouth daily.   Carboxymethylcellulose Sodium (ARTIFICIAL TEARS OP) Apply 1 drop to eye as needed.   Chlorhexidine Gluconate 4 % SOLN Apply 1 application topically daily. On Tuesday or Friday   cholecalciferol (VITAMIN D) 25 MCG (1000 UNIT) tablet Take 3,000 Units by mouth daily. In the morning between 8-11 am   donepezil (ARICEPT) 10 MG tablet Take 1 tablet (10 mg total) by mouth at bedtime.   DULoxetine (CYMBALTA) 60 MG capsule Take 60 mg by mouth daily.   Emollient (CERAVE DAILY MOISTURIZING) LOTN Apply topically daily.   furosemide (LASIX) 20 MG tablet Take 20 mg by mouth. Daily in  the afternoon   furosemide (LASIX) 40 MG tablet Take 40 mg by mouth every morning.   furosemide (LASIX) 40 MG tablet Take 40 mg by mouth as needed for edema or fluid.   KETOCONAZOLE, TOPICAL, 1 % SHAM Apply 1 application  topically 2 (two) times a week. Leave on for 15 min then rinse.   levothyroxine (SYNTHROID, LEVOTHROID) 25 MCG tablet Take 25 mcg by mouth daily before breakfast.    losartan (COZAAR) 100 MG tablet Take 100 mg by mouth daily.   miconazole (MICOTIN) 2 % cream Apply 1 application topically as needed (Apply to rash under breasts and abdominal folds as needed).   mineral oil-hydrophilic petrolatum (AQUAPHOR) ointment Apply topically as needed for dry skin.   Multiple Vitamins-Minerals (PRESERVISION AREDS 2+MULTI VIT PO) Take by mouth 2 (two) times daily.   nebivolol (BYSTOLIC) 10 MG tablet Take 1 tablet (10 mg total) by mouth daily.   nitroGLYCERIN (NITROSTAT) 0.4 MG SL tablet Place 0.4 mg under the tongue every 5 (five) minutes as needed.   ondansetron (ZOFRAN) 4 MG tablet Take 4 mg by mouth every 6 (six) hours as needed for nausea or vomiting.   potassium chloride SA (K-DUR,KLOR-CON) 20 MEQ tablet Take 20 mEq by mouth daily.    pramoxine (SARNA SENSITIVE) 1 % LOTN Apply topically. Daily and as needed   pramoxine (SARNA SENSITIVE) 1 % LOTN Apply 1 Application topically every evening.   sodium fluoride (PREVIDENT 5000 PLUS) 1.1 % CREA dental cream Place 1 application  onto teeth at bedtime.   UNABLE TO FIND CPAP   vitamin B-12 (CYANOCOBALAMIN) 1000 MCG tablet Take 1,000 mcg by mouth daily.   No facility-administered encounter medications on file as of 10/25/2022.    Review of Systems  Unable to perform ROS: Dementia    Vitals:   10/25/22 1950  BP: 127/75  Pulse: 65  Resp: 15  Temp: (!) 97.2 F (36.2 C)  Weight: 224 lb 12.8 oz (102 kg)   Body mass index is 43.9 kg/m. Physical Exam Vitals reviewed.  Constitutional:      Appearance: Normal appearance.  HENT:  Head: Normocephalic.     Nose: Nose normal.     Mouth/Throat:     Mouth: Mucous membranes are moist.     Pharynx: Oropharynx is clear.  Eyes:     Pupils: Pupils are equal, round, and reactive to light.  Cardiovascular:     Rate and Rhythm: Normal rate and regular rhythm.     Pulses: Normal pulses.     Heart sounds: Normal heart sounds. No murmur heard. Pulmonary:     Effort: Pulmonary effort is normal.     Breath sounds: Normal breath sounds.  Abdominal:     General: Abdomen is flat. Bowel sounds are normal.     Palpations: Abdomen is soft.  Musculoskeletal:        General: Swelling present.     Cervical back: Neck supple.  Skin:    General: Skin is warm.  Neurological:     General: No focal deficit present.     Mental Status: She is alert.  Psychiatric:        Mood and Affect: Mood normal.        Thought Content: Thought content normal.     Labs reviewed: Basic Metabolic Panel: Recent Labs    01/16/22 0000 07/06/22 0000 07/15/22 0000  NA 143 138 140  K 4.0 4.1 4.0  CL 105 99 102  CO2 27* 22 25*  BUN 13 16 14   CREATININE 0.9 1.0 1.0  CALCIUM 9.1 9.1 9.7   Liver Function Tests: Recent Labs    01/16/22 0000  ALBUMIN 3.3*   No results for input(s): "LIPASE", "AMYLASE" in the last 8760 hours. No results for input(s): "AMMONIA" in the last 8760 hours. CBC: Recent Labs    11/12/21 0000 01/16/22 0000 07/06/22 0000  WBC 8.1 7.6 8.8  HGB 12.1 11.7* 11.6*  HCT 38 34* 37  PLT 213 230 225   Cardiac Enzymes: No results for input(s): "CKTOTAL", "CKMB", "CKMBINDEX", "TROPONINI" in the last 8760 hours. BNP: Invalid input(s): "POCBNP" Lab Results  Component Value Date   HGBA1C 6.1 03/01/2019   Lab Results  Component Value Date   TSH 1.77 07/06/2022   Lab Results  Component Value Date   VITAMINB12 331 07/28/2017   No results found for: "FOLATE" No results found for: "IRON", "TIBC", "FERRITIN"  Imaging and Procedures obtained prior to SNF  admission: CT HEAD WO CONTRAST  Result Date: 07/29/2017 CLINICAL DATA:  Memory loss with occasional vertebral and bilateral extremity weakness EXAM: CT HEAD WITHOUT CONTRAST TECHNIQUE: Contiguous axial images were obtained from the base of the skull through the vertex without intravenous contrast. COMPARISON:  MRI 02/23/2014, CT brain 02/22/2014 FINDINGS: Brain: No acute territorial infarction, hemorrhage or intracranial mass is visualized. Moderate atrophy. Moderate small vessel ischemic changes of the white matter, slight progression since prior CT. Stable ventricle size. Vascular: No hyperdense vessel.  Carotid vascular calcification. Skull: No fracture Sinuses/Orbits: Mild mucosal thickening in the maxillary and ethmoid sinuses. No acute orbital abnormality. Other: None IMPRESSION: 1. No CT evidence for acute intracranial abnormality. 2. Moderate atrophy with moderate small vessel ischemic changes of the white matter, slight progression since previous CT from 2015. Electronically Signed   By: Jasmine Pang M.D.   On: 07/29/2017 19:19   US Carotid Bilateral  Result Date: 07/29/2017 CLINICAL DATA:  CHF.  Carotid atherosclerosis.  Memory impairment. EXAM: BILATERAL CAROTID DUPLEX ULTRASOUND TECHNIQUE: Wallace Cullens scale imaging, color Doppler and duplex ultrasound were performed of bilateral carotid and vertebral arteries in the neck. COMPARISON:  None.  FINDINGS: Criteria: Quantification of carotid stenosis is based on velocity parameters that correlate the residual internal carotid diameter with NASCET-based stenosis levels, using the diameter of the distal internal carotid lumen as the denominator for stenosis measurement. The following velocity measurements were obtained: RIGHT ICA:  171 cm/sec CCA:  95 cm/sec SYSTOLIC ICA/CCA RATIO:  1.8 DIASTOLIC ICA/CCA RATIO:  2.0 ECA:  287 cm/sec LEFT ICA:  100 cm/sec CCA:  133 cm/sec SYSTOLIC ICA/CCA RATIO:  0.8 DIASTOLIC ICA/CCA RATIO:  1.6 ECA:  127 cm/sec RIGHT CAROTID  ARTERY: Echogenic plaque at the right carotid bulb. Posterior acoustic shadowing from calcified plaque at the carotid bulb. Elevated peak systolic velocity in the external carotid artery. Peak systolic velocity in the mid internal carotid artery is elevated, measuring 171 centimeters/second. RIGHT VERTEBRAL ARTERY: Antegrade flow in the right vertebral artery. Elevated peak systolic velocity, greater than 100 centimeters/second, of unknown significance. LEFT CAROTID ARTERY: Echogenic plaque at the left carotid bulb. Left common carotid artery is tortuous. External carotid artery is patent with normal waveform. Small amount of echogenic plaque in the proximal internal carotid artery. Normal waveforms and velocities in the internal carotid artery. LEFT VERTEBRAL ARTERY: Antegrade flow and normal waveform in the left vertebral artery. IMPRESSION: Atherosclerotic disease in the carotid arteries, most prominent at the right carotid bulb. Estimated degree of stenosis in the right internal carotid artery is 50-69%. Estimated degree of stenosis in the left internal carotid artery is less than 50%. Right external carotid artery stenosis. Patent vertebral arteries with antegrade flow. Electronically Signed   By: Richarda Overlie M.D.   On: 07/29/2017 18:09   DG Chest 2 View  Result Date: 07/29/2017 CLINICAL DATA:  Congestive heart failure.  Shortness of breath. EXAM: CHEST - 2 VIEW COMPARISON:  Radiographs of February 26, 2014. FINDINGS: Stable cardiomegaly. Left-sided pacemaker is unchanged in position. Atherosclerosis of thoracic aorta is noted. No pneumothorax or pleural effusion is noted. No acute pulmonary disease is noted. Narrowing of right subacromial space is noted suggesting rotator cuff injury. IMPRESSION: No acute cardiopulmonary abnormality seen. Aortic Atherosclerosis (ICD10-I70.0). Electronically Signed   By: Lupita Raider, M.D.   On: 07/29/2017 16:53    Assessment/Plan 1. Chronic diastolic congestive heart  failure (HCC) On Lasix  Creat stable  2. Vascular dementia without behavioral disturbance (HCC) On Aricept Continues to do well  SNF appropriate  3. Primary hypertension Losartan and Nebivolol  4. Depression, major, recurrent, in partial remission (HCC) Cymbalta  5. Acquired hypothyroidism TSH normal in 02/24  6. Obstructive sleep apnea on CPAP   7. Stage 3a chronic kidney disease (HCC) stable  8. Pacemaker     Family/ staff Communication:   Labs/tests ordered:

## 2022-10-27 ENCOUNTER — Non-Acute Institutional Stay (INDEPENDENT_AMBULATORY_CARE_PROVIDER_SITE_OTHER): Payer: Medicare Other | Admitting: Orthopedic Surgery

## 2022-10-27 ENCOUNTER — Encounter: Payer: Self-pay | Admitting: Orthopedic Surgery

## 2022-10-27 ENCOUNTER — Telehealth: Payer: Medicare Other | Admitting: Orthopedic Surgery

## 2022-10-27 DIAGNOSIS — R1311 Dysphagia, oral phase: Secondary | ICD-10-CM | POA: Diagnosis not present

## 2022-10-27 DIAGNOSIS — F4321 Adjustment disorder with depressed mood: Secondary | ICD-10-CM | POA: Diagnosis not present

## 2022-10-27 DIAGNOSIS — Z0000 Encounter for general adult medical examination without abnormal findings: Secondary | ICD-10-CM | POA: Diagnosis not present

## 2022-10-27 DIAGNOSIS — I6031 Nontraumatic subarachnoid hemorrhage from right posterior communicating artery: Secondary | ICD-10-CM | POA: Diagnosis not present

## 2022-10-27 DIAGNOSIS — R41841 Cognitive communication deficit: Secondary | ICD-10-CM | POA: Diagnosis not present

## 2022-10-27 MED ORDER — HYDROXYZINE PAMOATE 25 MG PO CAPS: 25.0000 mg | ORAL_CAPSULE | Freq: Two times a day (BID) | ORAL | 0 refills | Status: AC | PRN

## 2022-10-27 NOTE — Patient Instructions (Signed)
  [name] , Thank you for taking time to come for your Medicare Wellness Visit. I appreciate your ongoing commitment to your health goals. Please review the following plan we discussed and let me know if I can assist you in the future.   These are the goals we discussed:  Goals      DIET - REDUCE CALORIE INTAKE     Exercise 3x per week (30 min per time)        This is a list of the screening recommended for you and due dates:  Health Maintenance  Topic Date Due   COVID-19 Vaccine (8 - 2023-24 season) 01/21/2023*   Flu Shot  12/23/2022   DTaP/Tdap/Td vaccine (3 - Tdap) 01/17/2023   Medicare Annual Wellness Visit  10/27/2023   Pneumonia Vaccine  Completed   DEXA scan (bone density measurement)  Completed   Zoster (Shingles) Vaccine  Completed   HPV Vaccine  Aged Out  *Topic was postponed. The date shown is not the original due date.

## 2022-10-27 NOTE — Progress Notes (Signed)
Subjective:   [name] is a 89 y.o. female who presents for Medicare Annual (Subsequent) preventive examination.  Place of Service: [facility] skilled nursing unit Provider: Hazle Nordmann, AGNP-C   Review of Systems     Cardiac Risk Factors include: advanced age (>25men, >51 women);hypertension;dyslipidemia;sedentary lifestyle;obesity (BMI >30kg/m2)     Objective:    Today's Vitals   10/27/22 1343 10/27/22 1346  BP: 127/75   Pulse: 65   Resp: 15   Temp: (!) 97.2 F (36.2 C)   SpO2: 93%   Weight: 224 lb 6.4 oz (101.8 kg)   Height: 5' (1.524 m)   PainSc:  0-No pain   Body mass index is 43.83 kg/m.     09/28/2022   10:17 AM 09/16/2022   11:37 AM 09/16/2022   10:39 AM 08/31/2022   10:31 AM 08/02/2022    1:58 PM 07/23/2022   11:18 AM 07/13/2022   11:10 AM  Advanced Directives  Does Patient Have a Medical Advance Directive? Yes Yes Yes Yes Yes Yes Yes  Type of Advance Directive Out of facility DNR (pink MOST or yellow form);Healthcare Power of [employer] of facility DNR (pink MOST or yellow form);Healthcare Power of [employer] of facility DNR (pink MOST or yellow form);Healthcare Power of [employer] of facility DNR (pink MOST or yellow form) Healthcare Power of [place];Out of facility DNR (pink MOST or yellow form) Healthcare Power of [place];Out of facility DNR (pink MOST or yellow form) Out of facility DNR (pink MOST or yellow form)  Does patient want to make changes to medical advance directive? No - Patient declined No - Patient declined No - Patient declined No - Patient declined No - Patient declined No - Patient declined No - Patient declined  Copy of Healthcare Power of Attorney in Chart? Yes - validated most recent copy scanned in chart (See row information) Yes - validated most recent copy scanned in chart (See row information) Yes - validated most recent copy scanned in chart (See row information)  Yes - validated most recent copy scanned in chart (See row  information) Yes - validated most recent copy scanned in chart (See row information)   Pre-existing out of facility DNR order (yellow form or pink MOST form)  Yellow form placed in chart (order not valid for inpatient use) Yellow form placed in chart (order not valid for inpatient use)   Yellow form placed in chart (order not valid for inpatient use)     Current Medications (verified) Outpatient Encounter Medications as of 10/27/2022  Medication Sig   acetaminophen (TYLENOL) 500 MG tablet Take 500 mg by mouth every 8 (eight) hours as needed.    ammonium lactate (AMLACTIN) 12 % cream Apply 1 Application topically at bedtime.   aspirin EC 81 MG tablet Take 81 mg by mouth daily.   Carboxymethylcellulose Sodium (ARTIFICIAL TEARS OP) Apply 1 drop to eye as needed.   Chlorhexidine Gluconate 4 % SOLN Apply 1 application topically daily. On Tuesday or Friday   cholecalciferol (VITAMIN D) 25 MCG (1000 UNIT) tablet Take 3,000 Units by mouth daily. In the morning between 8-11 am   donepezil (ARICEPT) 10 MG tablet Take 1 tablet (10 mg total) by mouth at bedtime.   DULoxetine (CYMBALTA) 60 MG capsule Take 60 mg by mouth daily.   Emollient (CERAVE DAILY MOISTURIZING) LOTN Apply topically daily.   furosemide (LASIX) 20 MG tablet Take 20 mg by mouth. Daily in the afternoon   furosemide (LASIX) 40 MG tablet Take  40 mg by mouth every morning.   furosemide (LASIX) 40 MG tablet Take 40 mg by mouth as needed for edema or fluid.   KETOCONAZOLE, TOPICAL, 1 % SHAM Apply 1 application  topically 2 (two) times a week. Leave on for 15 min then rinse.   levothyroxine (SYNTHROID, LEVOTHROID) 25 MCG tablet Take 25 mcg by mouth daily before breakfast.    losartan (COZAAR) 100 MG tablet Take 100 mg by mouth daily.   miconazole (MICOTIN) 2 % cream Apply 1 application topically as needed (Apply to rash under breasts and abdominal folds as needed).   mineral oil-hydrophilic petrolatum (AQUAPHOR) ointment Apply topically as needed  for dry skin.   Multiple Vitamins-Minerals (PRESERVISION AREDS 2+MULTI VIT PO) Take by mouth 2 (two) times daily.   nebivolol (BYSTOLIC) 10 MG tablet Take 1 tablet (10 mg total) by mouth daily.   nitroGLYCERIN (NITROSTAT) 0.4 MG SL tablet Place 0.4 mg under the tongue every 5 (five) minutes as needed.   ondansetron (ZOFRAN) 4 MG tablet Take 4 mg by mouth every 6 (six) hours as needed for nausea or vomiting.   potassium chloride SA (K-DUR,KLOR-CON) 20 MEQ tablet Take 20 mEq by mouth daily.    pramoxine (SARNA SENSITIVE) 1 % LOTN Apply topically. Daily and as needed   pramoxine (SARNA SENSITIVE) 1 % LOTN Apply 1 Application topically every evening.   sodium fluoride (PREVIDENT 5000 PLUS) 1.1 % CREA dental cream Place 1 application  onto teeth at bedtime.   UNABLE TO FIND CPAP   vitamin B-12 (CYANOCOBALAMIN) 1000 MCG tablet Take 1,000 mcg by mouth daily.   No facility-administered encounter medications on file as of 10/27/2022.    Allergies (verified) Chlor-trimeton [chlorpheniramine], Codeine, Nystatin, and Amlodipine   History: Past Medical History:  Diagnosis Date   Breast cancer (HCC)    Colon polyps    Coronary artery disease 06/24/2006   STEMI inferior with BMS to mid RCA with mid basal inferior wall HK and luminal irrgularities elsewhere   Fracture of femoral neck, right (HCC) 06/13/2011   Fracture of humeral head, right, closed 06/13/2011   GERD (gastroesophageal reflux disease)    Hemorrhoids    Hyperlipemia    Hypertension    LBBB (left bundle branch block)    Macular degeneration    Myocardial infarction (HCC)    Obstructive sleep apnea on CPAP    Osteopenia    Osteopenia    Pacemaker 02/25/2014   Rhinitis    Rotator cuff syndrome    SAH (subarachnoid hemorrhage) (HCC) 02/22/2014   Stokes [place] attack 02/22/2014   Ulnar neuropathy    Umbilical hernia    Unstable gait    Vascular dementia without behavioral disturbance (HCC) 07/04/2019   MMSE 10/11/19 28/30, 06/08/20  28/30   Wears hearing aid    Per [facility] records   Past Surgical History:  Procedure Laterality Date   BREAST LUMPECTOMY     CATARACT EXTRACTION, BILATERAL     Per [facility]   CORONARY ANGIOPLASTY WITH STENT PLACEMENT  06/2006   Mid RCA BMS   HIP ARTHROPLASTY  06/06/2011   Procedure: ARTHROPLASTY BIPOLAR HIP;  Surgeon: Loanne Drilling;  Location: [facility] ORS;  Service: Orthopedics;  Laterality: Right;   PERMANENT PACEMAKER INSERTION N/A 02/25/2014   Procedure: PERMANENT PACEMAKER INSERTION;  Surgeon: Marinus Maw, MD;  [facility] CATH LAB;  Service: Cardiovascular;  Laterality: N/A;   SHOULDER SURGERY     TONSILLECTOMY     Per [facility]  WRIST SURGERY     Family History  Problem Relation Age of Onset   Heart attack Father 61   CAD Father    CVA Sister    Social History   Socioeconomic History   Marital status: Married    Spouse name: Not on file   Number of children: Not on file   Years of education: Not on file   Highest education level: Not on file  Occupational History   Not on file  Tobacco Use   Smoking status: Never   Smokeless tobacco: Never  Vaping Use   Vaping Use: Never used  Substance and Sexual Activity   Alcohol use: Yes    Alcohol/week: 0.0 standard drinks of alcohol    Comment: occasional wine   Drug use: Never   Sexual activity: Not on file  Other Topics Concern   Not on file  Social History Narrative   Not on file   Social Determinants of Health   Financial Resource Strain: Low Risk  (10/27/2022)   Overall Financial Resource Strain (CARDIA)    Difficulty of Paying Living Expenses: Not hard at all  Food Insecurity: No Food Insecurity (10/27/2022)   Hunger Vital Sign    Worried About Running Out of Food in the Last Year: Never true    Ran Out of Food in the Last Year: Never true  Transportation Needs: No Transportation Needs (10/27/2022)   PRAPARE - Administrator, Civil Service (Medical): No    Lack of  Transportation (Non-Medical): No  Physical Activity: Inactive (08/18/2021)   Exercise Vital Sign    Days of Exercise per Week: 0 days    Minutes of Exercise per Session: 0 min  Stress: No Stress Concern Present (10/27/2022)   [employer] of Occupational Health - Occupational Stress Questionnaire    Feeling of Stress : Only a little  Social Connections: Unknown (10/27/2022)   Social Connection and Isolation Panel [NHANES]    Frequency of Communication with Friends and Family: Patient unable to answer    Frequency of Social Gatherings with Friends and Family: Patient unable to answer    Attends Religious Services: Patient unable to answer    Active Member of Clubs or Organizations: Patient unable to answer    Attends Banker Meetings: Patient unable to answer    Marital Status: Not on file    Tobacco Counseling Counseling given: Not Answered   Clinical Intake:  Pre-visit preparation completed: Yes  Pain : No/denies pain Pain Score: 0-No pain     BMI - recorded: 43.83 Nutritional Status: BMI > 30  Obese Nutritional Risks: None Diabetes: No  How often do you need to have someone help you when you read instructions, pamphlets, or other written materials from your doctor or pharmacy?: 4 - Often What is the last grade level you completed in school?: college, but not sure  Diabetic? No  Interpreter Needed?: No      Activities of Daily Living    10/27/2022    1:51 PM  In your present state of health, do you have any difficulty performing the following activities:  Hearing? 0  Vision? 1  Difficulty concentrating or making decisions? 1  Walking or climbing stairs? 1  Dressing or bathing? 1  Doing errands, shopping? 1  Preparing Food and eating ? Y  Using the Toilet? Y  In the past six months, have you accidently leaked urine? Y  Do you have problems with loss of bowel control? Jeannie Fend  Managing your Medications? Y  Managing your Finances? Y  Housekeeping or  managing your Housekeeping? Y    Patient Care Team: Mahlon Gammon, MD as PCP - General (Internal Medicine) Quintella Reichert, MD as PCP - Sleep Medicine (Sleep Medicine) Marinus Maw, MD as PCP - Cardiology (Cardiology) Francena Hanly, MD as Consulting Physician (Orthopedic Surgery) Ollen Gross, MD as Consulting Physician (Orthopedic Surgery) Micki Riley, MD as Consulting Physician (Neurology) Marinus Maw, MD as Consulting Physician (Cardiology) Corky Crafts, MD as Consulting Physician (Interventional Cardiology) Mckinley Jewel, MD as Consulting Physician (Ophthalmology) [facility] (Skilled Nursing Facility) Fletcher Anon, NP as Nurse Practitioner (Nurse Practitioner)  Indicate any recent Medical Services you may have received from other than [facility] providers in the past year (date may be approximate).     Assessment:   This is a routine wellness examination for Tristen.  Hearing/Vision screen No results found.  Dietary issues and exercise activities discussed: Current Exercise Habits: The patient does not participate in regular exercise at present, Exercise limited by: neurologic condition(s);orthopedic condition(s);cardiac condition(s)   Goals Addressed             This Visit's Progress    DIET - REDUCE CALORIE INTAKE   On track    Exercise 3x per week (30 min per time)   Not on track      Depression Screen    10/27/2022    1:46 PM 08/31/2022   12:49 PM 04/20/2022    2:25 PM 01/15/2021    1:49 PM 07/04/2019    9:05 AM 12/21/2018   11:33 AM 10/18/2018    1:32 PM  PHQ 2/9 Scores  PHQ - 2 Score 0 0 0 0 0 1 0    Fall Risk    10/27/2022    1:46 PM 08/31/2022   12:48 PM 07/07/2022   10:36 AM 04/20/2022    2:25 PM 08/18/2021   12:45 PM  Fall Risk   Falls in the past year? 1 1 0 0 1  Number falls in past yr: 0 0 0 0 0  Injury with Fall? 0 1 0 0 0  Risk for fall due to : History of fall(s);Impaired balance/gait;Impaired  mobility History of fall(s);Impaired balance/gait;Impaired mobility History of fall(s) No Fall Risks;History of fall(s);Impaired balance/gait;Orthopedic patient;Impaired mobility History of fall(s);Impaired balance/gait;Impaired mobility;Orthopedic patient  Risk for fall due to: Comment    Chronic knee pain chronic knee pain  Follow up Falls evaluation completed;Education provided;Falls prevention discussed Falls evaluation completed;Education provided;Falls prevention discussed Falls evaluation completed Falls evaluation completed Education provided;Falls prevention discussed    FALL RISK PREVENTION PERTAINING TO THE HOME:  Any stairs in or around the home? No  If so, are there any without handrails? No  Home free of loose throw rugs in walkways, pet beds, electrical cords, etc? Yes  Adequate lighting in your home to reduce risk of falls? Yes   ASSISTIVE DEVICES UTILIZED TO PREVENT FALLS:  Life alert? No  Use of a cane, walker or w/c? Yes  Grab bars in the bathroom? Yes  Shower chair or bench in shower? Yes  Elevated toilet seat or a handicapped toilet? Yes   TIMED UP AND GO:  Was the test performed? No .  Length of time to ambulate 10 feet: N/A> ambulates with wheelchair sec.   Gait slow and steady with assistive device  Cognitive Function:    08/18/2021   12:47 PM 12/21/2018   12:10 PM 07/30/2017  10:00 AM  MMSE - Mini Mental State Exam  Not completed: Unable to complete    Orientation to time  4 5  Orientation to Place  5 5  Registration  3 3  Attention/ Calculation  5 5  Recall  2 3  Language- name 2 objects  2 2  Language- repeat  1 1  Language- follow 3 step command  3 3  Language- read & follow direction  1 1  Write a sentence  1 1  Copy design  1 1  Total score  28 30        08/18/2021   12:47 PM 01/15/2021    2:22 PM  6CIT Screen  What Year? 0 points 0 points  What month?  0 points  What time?  0 points  Count back from 20  0 points  Months in reverse  0  points  Repeat phrase  0 points  Total Score  0 points    Immunizations Immunization History  Administered Date(s) Administered   Influenza, High Dose Seasonal PF 02/18/2017, 03/22/2019, 03/14/2020   Influenza,inj,Quad PF,6+ Mos 03/24/2018   Influenza-Unspecified 03/04/2016, 03/18/2020, 02/25/2021, 02/26/2022   Moderna Covid-19 Vaccine Bivalent Booster 11yrs & up 09/18/2021, 03/29/2022   Moderna SARS-COV2 Booster Vaccination 04/03/2020, 01/13/2021   Moderna Sars-Covid-2 Vaccination 06/04/2019, 07/03/2019, 03/04/2021   Pneumococcal Conjugate-13 05/08/2014   Pneumococcal Polysaccharide-23 10/08/2017   Td 07/09/2002, 01/16/2013   Zoster Recombinat (Shingrix) 11/18/2017, 01/17/2018   Zoster, Live 06/30/2010    TDAP status: Up to date  Flu Vaccine status: Up to date  Pneumococcal vaccine status: Up to date  Covid-19 vaccine status: Completed vaccines  Qualifies for Shingles Vaccine? Yes   Zostavax completed Yes   Shingrix Completed?: Yes  Screening Tests Health Maintenance  Topic Date Due   COVID-19 Vaccine (8 - 2023-24 season) 01/21/2023 (Originally 05/24/2022)   INFLUENZA VACCINE  12/23/2022   DTaP/Tdap/Td (3 - Tdap) 01/17/2023   Medicare Annual Wellness (AWV)  10/27/2023   Pneumonia Vaccine 26+ Years old  Completed   DEXA SCAN  Completed   Zoster Vaccines- Shingrix  Completed   HPV VACCINES  Aged Out    Health Maintenance  There are no preventive care reminders to display for this patient.   Colorectal cancer screening: No longer required.   Mammogram status: No longer required due to advanced age, goals of care.  Bone Density status: Completed 2015. Results reflect: Bone density results: OSTEOPOROSIS. Repeat every discontinued> nonambulatory years.  Lung Cancer Screening: (Low Dose CT Chest recommended if Age 91-80 years, 30 pack-year currently smoking OR have quit w/in 15years.) does not qualify.   Lung Cancer Screening Referral: No  Additional  Screening:  Hepatitis C Screening: does not qualify; Completed   Vision Screening: Recommended annual ophthalmology exams for early detection of glaucoma and other disorders of the eye. Is the patient up to date with their annual eye exam?  Yes  Who is the provider or what is the name of the office in which the patient attends annual eye exams? In house provider at [facility] If pt is not established with a provider, would they like to be referred to a provider to establish care? No .   Dental Screening: Recommended annual dental exams for proper oral hygiene  Community Resource Referral / Chronic Care Management: CRR required this visit?  No   CCM required this visit?  No      Plan:     I have personally reviewed and noted the following  in the patient's chart:   Medical and social history Use of alcohol, tobacco or illicit drugs  Current medications and supplements including opioid prescriptions. Patient is not currently taking opioid prescriptions. Functional ability and status Nutritional status Physical activity Advanced directives List of other physicians Hospitalizations, surgeries, and ER visits in previous 12 months Vitals Screenings to include cognitive, depression, and falls Referrals and appointments  In addition, I have reviewed and discussed with patient certain preventive protocols, quality metrics, and best practice recommendations. A written personalized care plan for preventive services as well as general preventive health recommendations were provided to patient.     Octavia Heir, NP   10/27/2022   Nurse Notes: no recommendations at this time due to advanced age and goals of care

## 2022-10-27 NOTE — Telephone Encounter (Signed)
[facility] nursing calls to report increased anxiety and diaphoresis. Behaviors began after dinner. Nursing assessment unremarkable. Vitals stable. No recent injury or fall. H/o dementia and depression/anxiety. She is on Cymbalta and Aricept. I had seen her earlier today for annual wellness and did not observe any behaviors.  Orders for hydroxyzine 25 mg BID prn x 14 days given.

## 2022-10-29 ENCOUNTER — Non-Acute Institutional Stay (SKILLED_NURSING_FACILITY): Payer: Medicare Other | Admitting: Adult Health

## 2022-10-29 ENCOUNTER — Encounter: Payer: Self-pay | Admitting: Adult Health

## 2022-10-29 DIAGNOSIS — K047 Periapical abscess without sinus: Secondary | ICD-10-CM

## 2022-10-29 DIAGNOSIS — R41841 Cognitive communication deficit: Secondary | ICD-10-CM | POA: Diagnosis not present

## 2022-10-29 DIAGNOSIS — I6031 Nontraumatic subarachnoid hemorrhage from right posterior communicating artery: Secondary | ICD-10-CM | POA: Diagnosis not present

## 2022-10-29 DIAGNOSIS — R1311 Dysphagia, oral phase: Secondary | ICD-10-CM | POA: Diagnosis not present

## 2022-10-29 DIAGNOSIS — F4321 Adjustment disorder with depressed mood: Secondary | ICD-10-CM | POA: Diagnosis not present

## 2022-10-29 MED ORDER — AMOXICILLIN-POT CLAVULANATE 875-125 MG PO TABS: 1.0000 | ORAL_TABLET | Freq: Two times a day (BID) | ORAL | 0 refills | Status: AC

## 2022-10-29 MED ORDER — ACETAMINOPHEN 325 MG PO TABS
650.0000 mg | ORAL_TABLET | Freq: Two times a day (BID) | ORAL | 0 refills | Status: AC
Start: 2022-10-29 — End: 2022-11-05

## 2022-10-29 MED ORDER — IBUPROFEN 200 MG PO TABS
200.0000 mg | ORAL_TABLET | Freq: Two times a day (BID) | ORAL | 0 refills | Status: AC | PRN
Start: 2022-10-29 — End: 2022-11-05

## 2022-10-29 NOTE — Progress Notes (Signed)
Location:  [facility] Room Number: 124A Place of Service:  SNF [phone]) Provider:  Fletcher Anon, NP    Patient Care Team: Mahlon Gammon, MD as PCP - General (Internal Medicine) Quintella Reichert, MD as PCP - Sleep Medicine (Sleep Medicine) Marinus Maw, MD as PCP - Cardiology (Cardiology) Francena Hanly, MD as Consulting Physician (Orthopedic Surgery) Ollen Gross, MD as Consulting Physician (Orthopedic Surgery) Micki Riley, MD as Consulting Physician (Neurology) Marinus Maw, MD as Consulting Physician (Cardiology) Corky Crafts, MD as Consulting Physician (Interventional Cardiology) Mckinley Jewel, MD as Consulting Physician (Ophthalmology) Community, [facility] (Skilled Nursing Facility) Fletcher Anon, NP as Nurse Practitioner (Nurse Practitioner)  Extended Emergency Contact Information Primary Emergency Contact: Postlewaite,JED Address: [address]          Ginette Otto [place] Home Phone: [phone] Mobile Phone: [phone] Relation: Son Secondary Emergency Contact: MCGARRY,LEE Mobile Phone: [phone] Relation: Daughter  Code Status: DNR Goals of care: Advanced Directive information    10/29/2022   10:34 AM  Advanced Directives  Does Patient Have a Medical Advance Directive? Yes  Type of Advance Directive Out of facility DNR (pink MOST or yellow form);Healthcare Power of Attorney  Does patient want to make changes to medical advance directive? No - Patient declined  Copy of Healthcare Power of Attorney in Chart? Yes - validated most recent copy scanned in chart (See row information)     Chief Complaint  Patient presents with   Acute Visit    Jaw Pain     HPI:  Pt is a 89 y.o. female seen today for an acute visit for right jaw pain  Nurse reports this am pt is having jaw pain on the right and some swelling. She had a crown come off on that side and has an apt with the  dentist.  She had a dental infection with tooth extraction in march as well.    Past Medical History:  Diagnosis Date   Breast cancer (HCC)    Colon polyps    Coronary artery disease 06/24/2006   STEMI inferior with BMS to mid RCA with mid basal inferior wall HK and luminal irrgularities elsewhere   Fracture of femoral neck, right (HCC) 06/13/2011   Fracture of humeral head, right, closed 06/13/2011   GERD (gastroesophageal reflux disease)    Hemorrhoids    Hyperlipemia    Hypertension    LBBB (left bundle branch block)    Macular degeneration    Myocardial infarction (HCC)    Obstructive sleep apnea on CPAP    Osteopenia    Osteopenia    Pacemaker 02/25/2014   Rhinitis    Rotator cuff syndrome    SAH (subarachnoid hemorrhage) (HCC) 02/22/2014   Stokes Adams attack 02/22/2014   Ulnar neuropathy    Umbilical hernia    Unstable gait    Vascular dementia without behavioral disturbance (HCC) 07/04/2019   MMSE 10/11/19 28/30, 06/08/20 28/30   Wears hearing aid    Per [facility] records   Past Surgical History:  Procedure Laterality Date   BREAST LUMPECTOMY     CATARACT EXTRACTION, BILATERAL     Per [facility]   CORONARY ANGIOPLASTY WITH STENT PLACEMENT  06/2006   Mid RCA BMS   HIP ARTHROPLASTY  06/06/2011   Procedure: ARTHROPLASTY BIPOLAR HIP;  Surgeon: Loanne Drilling;  Location: [facility] ORS;  Service: Orthopedics;  Laterality: Right;   PERMANENT PACEMAKER INSERTION N/A 02/25/2014   Procedure:  PERMANENT PACEMAKER INSERTION;  Surgeon: Marinus Maw, MD;  Location: [facility] CATH LAB;  Service: Cardiovascular;  Laterality: N/A;   SHOULDER SURGERY     TONSILLECTOMY     Per [facility]    WRIST SURGERY      Allergies  Allergen Reactions   Chlor-Trimeton [Chlorpheniramine] Anaphylaxis   Codeine Anaphylaxis   Nystatin Rash    redness   Amlodipine     Per [facility]     Outpatient Encounter Medications as of 10/29/2022  Medication Sig    acetaminophen (TYLENOL) 500 MG tablet Take 500 mg by mouth every 8 (eight) hours as needed.    ammonium lactate (AMLACTIN) 12 % cream Apply 1 Application topically at bedtime.   aspirin EC 81 MG tablet Take 81 mg by mouth daily.   Carboxymethylcellulose Sodium (ARTIFICIAL TEARS OP) Apply 1 drop to eye as needed.   Chlorhexidine Gluconate 4 % SOLN Apply 1 application topically daily. On Tuesday or Friday   cholecalciferol (VITAMIN D) 25 MCG (1000 UNIT) tablet Take 3,000 Units by mouth daily. In the morning between 8-11 am   donepezil (ARICEPT) 10 MG tablet Take 1 tablet (10 mg total) by mouth at bedtime.   DULoxetine (CYMBALTA) 60 MG capsule Take 60 mg by mouth daily.   Emollient (CERAVE DAILY MOISTURIZING) LOTN Apply topically daily.   furosemide (LASIX) 20 MG tablet Take 20 mg by mouth. Daily in the afternoon   furosemide (LASIX) 40 MG tablet Take 40 mg by mouth every morning.   furosemide (LASIX) 40 MG tablet Take 40 mg by mouth as needed for edema or fluid.   hydrOXYzine (VISTARIL) 25 MG capsule Take 1 capsule (25 mg total) by mouth 2 (two) times daily as needed for up to 14 days.   KETOCONAZOLE, TOPICAL, 1 % SHAM Apply 1 application  topically 2 (two) times a week. Leave on for 15 min then rinse.   levothyroxine (SYNTHROID, LEVOTHROID) 25 MCG tablet Take 25 mcg by mouth daily before breakfast.    losartan (COZAAR) 100 MG tablet Take 100 mg by mouth daily.   miconazole (MICOTIN) 2 % cream Apply 1 application topically as needed (Apply to rash under breasts and abdominal folds as needed).   mineral oil-hydrophilic petrolatum (AQUAPHOR) ointment Apply topically as needed for dry skin.   Multiple Vitamins-Minerals (PRESERVISION AREDS 2+MULTI VIT PO) Take by mouth 2 (two) times daily.   nebivolol (BYSTOLIC) 10 MG tablet Take 1 tablet (10 mg total) by mouth daily.   nitroGLYCERIN (NITROSTAT) 0.4 MG SL tablet Place 0.4 mg under the tongue every 5 (five) minutes as needed.   ondansetron (ZOFRAN) 4  MG tablet Take 4 mg by mouth every 6 (six) hours as needed for nausea or vomiting.   potassium chloride SA (K-DUR,KLOR-CON) 20 MEQ tablet Take 20 mEq by mouth daily.    pramoxine (SARNA SENSITIVE) 1 % LOTN Apply topically. Daily and as needed   pramoxine (SARNA SENSITIVE) 1 % LOTN Apply 1 Application topically every evening.   sodium fluoride (PREVIDENT 5000 PLUS) 1.1 % CREA dental cream Place 1 application  onto teeth at bedtime.   UNABLE TO FIND CPAP   vitamin B-12 (CYANOCOBALAMIN) 1000 MCG tablet Take 1,000 mcg by mouth daily.   No facility-administered encounter medications on file as of 10/29/2022.    Review of Systems  Constitutional:  Negative for activity change, appetite change, chills, diaphoresis, fatigue, fever and unexpected weight change.  HENT:  Positive for dental problem and facial swelling. Negative  for congestion, drooling, ear discharge, ear pain, nosebleeds, postnasal drip, rhinorrhea, sinus pressure and sinus pain.        Mouth pain  Respiratory:  Negative for cough, shortness of breath and wheezing.   Cardiovascular:  Positive for leg swelling. Negative for chest pain and palpitations.  Gastrointestinal:  Negative for abdominal distention, abdominal pain, constipation and diarrhea.  Genitourinary:  Negative for difficulty urinating and dysuria.  Musculoskeletal:  Negative for arthralgias, back pain, gait problem, joint swelling and myalgias.  Neurological:  Negative for dizziness, tremors, seizures, syncope, facial asymmetry, speech difficulty, weakness, light-headedness, numbness and headaches.  Psychiatric/Behavioral:  Negative for agitation, behavioral problems and confusion.     Immunization History  Administered Date(s) Administered   Influenza, High Dose Seasonal PF 02/18/2017, 03/22/2019, 03/14/2020   Influenza,inj,Quad PF,6+ Mos 03/24/2018   Influenza-Unspecified 03/04/2016, 03/18/2020, 02/25/2021, 02/26/2022   Moderna Covid-19 Vaccine Bivalent Booster 31yrs  & up 09/18/2021, 03/29/2022   Moderna SARS-COV2 Booster Vaccination 04/03/2020, 01/13/2021   Moderna Sars-Covid-2 Vaccination 06/04/2019, 07/03/2019, 03/04/2021   Pneumococcal Conjugate-13 05/08/2014   Pneumococcal Polysaccharide-23 10/08/2017   Td 07/09/2002, 01/16/2013   Zoster Recombinat (Shingrix) 11/18/2017, 01/17/2018   Zoster, Live 06/30/2010   Pertinent  Health Maintenance Due  Topic Date Due   INFLUENZA VACCINE  12/23/2022   DEXA SCAN  Completed      08/18/2021   12:45 PM 04/20/2022    2:25 PM 07/07/2022   10:36 AM 08/31/2022   12:48 PM 10/27/2022    1:46 PM  Fall Risk  Falls in the past year? 1 0 0 1 1  Was there an injury with Fall? 0 0 0 1 0  Fall Risk Category Calculator 1 0 0 2 1  Fall Risk Category (Retired) Low Low     (RETIRED) Patient Fall Risk Level Moderate fall risk Moderate fall risk     Patient at Risk for Falls Due to History of fall(s);Impaired balance/gait;Impaired mobility;Orthopedic patient No Fall Risks;History of fall(s);Impaired balance/gait;Orthopedic patient;Impaired mobility History of fall(s) History of fall(s);Impaired balance/gait;Impaired mobility History of fall(s);Impaired balance/gait;Impaired mobility  Patient at Risk for Falls Due to - Comments chronic knee pain Chronic knee pain     Fall risk Follow up Education provided;Falls prevention discussed Falls evaluation completed Falls evaluation completed Falls evaluation completed;Education provided;Falls prevention discussed Falls evaluation completed;Education provided;Falls prevention discussed   Functional Status Survey:    Vitals:   10/29/22 1028  BP: 127/75  Pulse: 65  Resp: 15  Temp: (!) 97.2 F (36.2 C)  SpO2: 93%  Weight: 224 lb 12.8 oz (102 kg)  Height: 5' (1.524 m)   Body mass index is 43.9 kg/m. Physical Exam Vitals and nursing note reviewed.  Constitutional:      General: She is not in acute distress.    Appearance: She is not diaphoretic.  HENT:     Head:  Normocephalic and atraumatic.     Nose: Nose normal.     Mouth/Throat:     Mouth: Mucous membranes are moist.     Pharynx: Oropharynx is clear. No oropharyngeal exudate.   Neck:     Vascular: No JVD.  Cardiovascular:     Rate and Rhythm: Normal rate and regular rhythm.     Heart sounds: No murmur heard. Pulmonary:     Effort: Pulmonary effort is normal. No respiratory distress.     Breath sounds: Normal breath sounds. No wheezing.  Skin:    General: Skin is warm and dry.  Neurological:     Mental Status:  She is alert and oriented to person, place, and time.     Labs reviewed: Recent Labs    01/16/22 0000 07/06/22 0000 07/15/22 0000  NA 143 138 140  K 4.0 4.1 4.0  CL 105 99 102  CO2 27* 22 25*  BUN 13 16 14   CREATININE 0.9 1.0 1.0  CALCIUM 9.1 9.1 9.7   Recent Labs    01/16/22 0000  ALBUMIN 3.3*   Recent Labs    11/12/21 0000 01/16/22 0000 07/06/22 0000  WBC 8.1 7.6 8.8  HGB 12.1 11.7* 11.6*  HCT 38 34* 37  PLT 213 230 225   Lab Results  Component Value Date   TSH 1.77 07/06/2022   Lab Results  Component Value Date   HGBA1C 6.1 03/01/2019   Lab Results  Component Value Date   CHOL 200 10/18/2019   HDL 44 10/18/2019   LDLCALC 129 10/18/2019   TRIG 137 10/18/2019    Significant Diagnostic Results in last 30 days:  No results found.  Assessment/Plan  1. Dental infection Right lower jaw area  - amoxicillin-clavulanate (AUGMENTIN) 875-125 MG tablet; Take 1 tablet by mouth 2 (two) times daily for 7 days.  Dispense: 14 tablet; Refill: 0 - acetaminophen (TYLENOL) 325 MG tablet; Take 2 tablets (650 mg total) by mouth 2 (two) times daily for 7 days.  Dispense: 28 tablet; Refill: 0 - ibuprofen (ADVIL) 200 MG tablet; Take 1 tablet (200 mg total) by mouth 2 (two) times daily as needed for up to 7 days.  Dispense: 30 tablet; Refill: 0  Has apt with dentist  Family/ staff Communication: nurse  Labs/tests ordered:  NA

## 2022-10-31 ENCOUNTER — Encounter: Payer: Self-pay | Admitting: Internal Medicine

## 2022-11-03 DIAGNOSIS — R41841 Cognitive communication deficit: Secondary | ICD-10-CM | POA: Diagnosis not present

## 2022-11-03 DIAGNOSIS — I6031 Nontraumatic subarachnoid hemorrhage from right posterior communicating artery: Secondary | ICD-10-CM | POA: Diagnosis not present

## 2022-11-03 DIAGNOSIS — F4321 Adjustment disorder with depressed mood: Secondary | ICD-10-CM | POA: Diagnosis not present

## 2022-11-03 DIAGNOSIS — R1311 Dysphagia, oral phase: Secondary | ICD-10-CM | POA: Diagnosis not present

## 2022-11-04 DIAGNOSIS — F4321 Adjustment disorder with depressed mood: Secondary | ICD-10-CM | POA: Diagnosis not present

## 2022-11-04 DIAGNOSIS — R1311 Dysphagia, oral phase: Secondary | ICD-10-CM | POA: Diagnosis not present

## 2022-11-04 DIAGNOSIS — I6031 Nontraumatic subarachnoid hemorrhage from right posterior communicating artery: Secondary | ICD-10-CM | POA: Diagnosis not present

## 2022-11-04 DIAGNOSIS — R41841 Cognitive communication deficit: Secondary | ICD-10-CM | POA: Diagnosis not present

## 2022-11-09 ENCOUNTER — Ambulatory Visit: Payer: Medicare Other

## 2022-11-09 DIAGNOSIS — I4430 Unspecified atrioventricular block: Secondary | ICD-10-CM

## 2022-11-10 DIAGNOSIS — R1311 Dysphagia, oral phase: Secondary | ICD-10-CM | POA: Diagnosis not present

## 2022-11-10 DIAGNOSIS — I6031 Nontraumatic subarachnoid hemorrhage from right posterior communicating artery: Secondary | ICD-10-CM | POA: Diagnosis not present

## 2022-11-10 DIAGNOSIS — R41841 Cognitive communication deficit: Secondary | ICD-10-CM | POA: Diagnosis not present

## 2022-11-10 DIAGNOSIS — F4321 Adjustment disorder with depressed mood: Secondary | ICD-10-CM | POA: Diagnosis not present

## 2022-11-10 LAB — CUP PACEART REMOTE DEVICE CHECK
Battery Remaining Longevity: 16 mo
Battery Remaining Percentage: 14 %
Battery Voltage: 2.74 V
Brady Statistic AP VP Percent: 68 %
Brady Statistic AP VS Percent: 1 %
Brady Statistic AS VP Percent: 27 %
Brady Statistic AS VS Percent: 3 %
Brady Statistic RA Percent Paced: 68 %
Brady Statistic RV Percent Paced: 95 %
Date Time Interrogation Session: 20240618102557
Implantable Lead Connection Status: 753985
Implantable Lead Connection Status: 753985
Implantable Lead Implant Date: 20151005
Implantable Lead Implant Date: 20151005
Implantable Lead Location: 753859
Implantable Lead Location: 753860
Implantable Pulse Generator Implant Date: 20151005
Lead Channel Impedance Value: 440 Ohm
Lead Channel Impedance Value: 550 Ohm
Lead Channel Pacing Threshold Amplitude: 0.875 V
Lead Channel Pacing Threshold Amplitude: 1.25 V
Lead Channel Pacing Threshold Pulse Width: 0.5 ms
Lead Channel Pacing Threshold Pulse Width: 0.5 ms
Lead Channel Sensing Intrinsic Amplitude: 12 mV
Lead Channel Sensing Intrinsic Amplitude: 5 mV
Lead Channel Setting Pacing Amplitude: 1.125
Lead Channel Setting Pacing Amplitude: 2.25 V
Lead Channel Setting Pacing Pulse Width: 0.5 ms
Lead Channel Setting Sensing Sensitivity: 4 mV
Pulse Gen Model: 2240
Pulse Gen Serial Number: 7666288

## 2022-11-11 ENCOUNTER — Telehealth: Payer: Self-pay | Admitting: Adult Health

## 2022-11-11 MED ORDER — TRAMADOL HCL 50 MG PO TABS: 50.0000 mg | ORAL_TABLET | Freq: Four times a day (QID) | ORAL | 0 refills | Status: AC | PRN

## 2022-11-11 NOTE — Telephone Encounter (Signed)
Pt had tooth pulled and is having pain not controlled by tylenol. Nurse requesting ultram. Has tolerated in the past. Order given

## 2022-11-12 DIAGNOSIS — R1311 Dysphagia, oral phase: Secondary | ICD-10-CM | POA: Diagnosis not present

## 2022-11-12 DIAGNOSIS — R41841 Cognitive communication deficit: Secondary | ICD-10-CM | POA: Diagnosis not present

## 2022-11-12 DIAGNOSIS — I6031 Nontraumatic subarachnoid hemorrhage from right posterior communicating artery: Secondary | ICD-10-CM | POA: Diagnosis not present

## 2022-11-12 DIAGNOSIS — F4321 Adjustment disorder with depressed mood: Secondary | ICD-10-CM | POA: Diagnosis not present

## 2022-11-17 DIAGNOSIS — F4321 Adjustment disorder with depressed mood: Secondary | ICD-10-CM | POA: Diagnosis not present

## 2022-11-17 DIAGNOSIS — R41841 Cognitive communication deficit: Secondary | ICD-10-CM | POA: Diagnosis not present

## 2022-11-17 DIAGNOSIS — I6031 Nontraumatic subarachnoid hemorrhage from right posterior communicating artery: Secondary | ICD-10-CM | POA: Diagnosis not present

## 2022-11-17 DIAGNOSIS — R1311 Dysphagia, oral phase: Secondary | ICD-10-CM | POA: Diagnosis not present

## 2022-11-19 DIAGNOSIS — I6031 Nontraumatic subarachnoid hemorrhage from right posterior communicating artery: Secondary | ICD-10-CM | POA: Diagnosis not present

## 2022-11-19 DIAGNOSIS — R41841 Cognitive communication deficit: Secondary | ICD-10-CM | POA: Diagnosis not present

## 2022-11-19 DIAGNOSIS — R1311 Dysphagia, oral phase: Secondary | ICD-10-CM | POA: Diagnosis not present

## 2022-11-19 DIAGNOSIS — F4321 Adjustment disorder with depressed mood: Secondary | ICD-10-CM | POA: Diagnosis not present

## 2022-11-24 DIAGNOSIS — I6031 Nontraumatic subarachnoid hemorrhage from right posterior communicating artery: Secondary | ICD-10-CM | POA: Diagnosis not present

## 2022-11-24 DIAGNOSIS — R1311 Dysphagia, oral phase: Secondary | ICD-10-CM | POA: Diagnosis not present

## 2022-11-24 DIAGNOSIS — R41841 Cognitive communication deficit: Secondary | ICD-10-CM | POA: Diagnosis not present

## 2022-11-24 DIAGNOSIS — F4321 Adjustment disorder with depressed mood: Secondary | ICD-10-CM | POA: Diagnosis not present

## 2022-11-26 ENCOUNTER — Encounter: Payer: Self-pay | Admitting: Adult Health

## 2022-11-26 ENCOUNTER — Non-Acute Institutional Stay (SKILLED_NURSING_FACILITY): Payer: Medicare Other | Admitting: Adult Health

## 2022-11-26 DIAGNOSIS — E039 Hypothyroidism, unspecified: Secondary | ICD-10-CM

## 2022-11-26 DIAGNOSIS — F0150 Vascular dementia without behavioral disturbance: Secondary | ICD-10-CM | POA: Diagnosis not present

## 2022-11-26 DIAGNOSIS — I5032 Chronic diastolic (congestive) heart failure: Secondary | ICD-10-CM

## 2022-11-26 DIAGNOSIS — N6321 Unspecified lump in the left breast, upper outer quadrant: Secondary | ICD-10-CM

## 2022-11-26 DIAGNOSIS — G4733 Obstructive sleep apnea (adult) (pediatric): Secondary | ICD-10-CM

## 2022-11-26 DIAGNOSIS — I872 Venous insufficiency (chronic) (peripheral): Secondary | ICD-10-CM | POA: Diagnosis not present

## 2022-11-26 DIAGNOSIS — F3341 Major depressive disorder, recurrent, in partial remission: Secondary | ICD-10-CM | POA: Diagnosis not present

## 2022-11-26 NOTE — Progress Notes (Signed)
Location:  [facility] Room Number: 124A Place of Service:  SNF [phone]) Provider:  Tamsen Roers, MD  Patient Care Team: Mahlon Gammon, MD as PCP - General (Internal Medicine) Quintella Reichert, MD as PCP - Sleep Medicine (Sleep Medicine) Marinus Maw, MD as PCP - Cardiology (Cardiology) Francena Hanly, MD as Consulting Physician (Orthopedic Surgery) Ollen Gross, MD as Consulting Physician (Orthopedic Surgery) Micki Riley, MD as Consulting Physician (Neurology) Marinus Maw, MD as Consulting Physician (Cardiology) Corky Crafts, MD as Consulting Physician (Interventional Cardiology) Mckinley Jewel, MD as Consulting Physician (Ophthalmology) Community, [facility] (Skilled Nursing Facility) Fletcher Anon, NP as Nurse Practitioner (Nurse Practitioner)  Extended Emergency Contact Information Primary Emergency Contact: Bojanowski,JED Address: [address]          [place] Home Phone: [phone] Mobile Phone: [phone] Relation: Son Secondary Emergency Contact: MCGARRY,LEE Mobile Phone: [phone] Relation: Daughter  Code Status:  DNR Goals of care: Advanced Directive information    11/26/2022   12:03 PM  Advanced Directives  Does Patient Have a Medical Advance Directive? Yes  Type of Advance Directive Out of facility DNR (pink MOST or yellow form);Healthcare Power of Attorney  Does patient want to make changes to medical advance directive? No - Patient declined  Copy of Healthcare Power of Attorney in Chart? Yes - validated most recent copy scanned in chart (See row information)     Chief Complaint  Patient presents with   Medical Management of Chronic Issues    Patient is being seen for routine     HPI:  Pt is a 89 y.o. female seen today for medical management of chronic diseases.     PMH significant for breast ca, CAD with stent,  subarachnoid hemorrhage, dementia, incontinence, obesity, OP, HTN, diastolic CHF, GERD, pacemaker, among others. Has a hx of breast lump as well that her family declined mammogram for due to her age and dementia.   She resides in skilled care with dementia. Needs help with ADLS but she is able to feed herself , communicate her needs, and participate in activities. MMSE 27/30 on 09/02/22.  She had a tooth extraction on 6/20 and took amoxicillin afterward. She has still mild tenderness per the nurse. No fever. Eating and drinking has improved over time.    Chronic edema in legs with erythema unchanged. Weight: 222 lb 11.2 oz (101 kg)  Wt Readings from Last 3 Encounters:  11/26/22 222 lb 11.2 oz (101 kg)  10/29/22 224 lb 12.8 oz (102 kg)  10/27/22 224 lb 6.4 oz (101.8 kg)   Weight unchanged BMI 43.49  BP 110-130/60-70s  Past Surgical History:  Procedure Laterality Date   BREAST LUMPECTOMY     CATARACT EXTRACTION, BILATERAL     Per [facility]   CORONARY ANGIOPLASTY WITH STENT PLACEMENT  06/2006   Mid RCA BMS   HIP ARTHROPLASTY  06/06/2011   Procedure: ARTHROPLASTY BIPOLAR HIP;  Surgeon: Loanne Drilling;  Location: [facility] OR[facility];  Service: Orthopedics;  Laterality: Right;   PERMANENT PACEMAKER INSERTION N/A 02/25/2014   Procedure: PERMANENT PACEMAKER INSERTION;  Surgeon: Marinus Maw, MD;  Location: [facility] CATH LAB;  Service: Cardiovascular;  Laterality: N/A;   SHOULDER SURGERY     TONSILLECTOMY     Per [facility]    WRIST SURGERY      Allergies  Allergen Reactions   Chlor-Trimeton [Chlorpheniramine] Anaphylaxis   Codeine Anaphylaxis  Nystatin Rash    redness   Amlodipine     Per [facility]     Outpatient Encounter Medications as of 11/26/2022  Medication Sig   acetaminophen (TYLENOL) 500 MG tablet Take 500 mg by mouth every 8 (eight) hours as needed.    ammonium lactate (AMLACTIN) 12 % cream Apply 1 Application topically at bedtime.   aspirin EC  81 MG tablet Take 81 mg by mouth daily.   Carboxymethylcellulose Sodium (ARTIFICIAL TEARS OP) Apply 1 drop to eye as needed.   Chlorhexidine Gluconate 4 % SOLN Apply 1 application topically daily. On Tuesday or Friday   cholecalciferol (VITAMIN D) 25 MCG (1000 UNIT) tablet Take 3,000 Units by mouth daily. In the morning between 8-11 am   donepezil (ARICEPT) 10 MG tablet Take 1 tablet (10 mg total) by mouth at bedtime.   DULoxetine (CYMBALTA) 60 MG capsule Take 60 mg by mouth daily.   Emollient (CERAVE DAILY MOISTURIZING) LOTN Apply topically daily.   furosemide (LASIX) 20 MG tablet Take 20 mg by mouth. Daily in the afternoon   furosemide (LASIX) 40 MG tablet Take 40 mg by mouth every morning.   furosemide (LASIX) 40 MG tablet Take 40 mg by mouth as needed for edema or fluid.   KETOCONAZOLE, TOPICAL, 1 % SHAM Apply 1 application  topically 2 (two) times a week. Leave on for 15 min then rinse.   levothyroxine (SYNTHROID, LEVOTHROID) 25 MCG tablet Take 25 mcg by mouth daily before breakfast.    losartan (COZAAR) 100 MG tablet Take 100 mg by mouth daily.   miconazole (MICOTIN) 2 % cream Apply 1 application topically as needed (Apply to rash under breasts and abdominal folds as needed).   mineral oil-hydrophilic petrolatum (AQUAPHOR) ointment Apply topically as needed for dry skin.   Multiple Vitamins-Minerals (PRESERVISION AREDS 2+MULTI VIT PO) Take by mouth 2 (two) times daily.   nebivolol (BYSTOLIC) 10 MG tablet Take 1 tablet (10 mg total) by mouth daily.   nitroGLYCERIN (NITROSTAT) 0.4 MG SL tablet Place 0.4 mg under the tongue every 5 (five) minutes as needed.   ondansetron (ZOFRAN) 4 MG tablet Take 4 mg by mouth every 6 (six) hours as needed for nausea or vomiting.   potassium chloride SA (K-DUR,KLOR-CON) 20 MEQ tablet Take 20 mEq by mouth daily.    pramoxine (SARNA SENSITIVE) 1 % LOTN Apply topically. Daily and as needed   pramoxine (SARNA SENSITIVE) 1 % LOTN Apply 1 Application topically  every evening.   sodium fluoride (PREVIDENT 5000 PLUS) 1.1 % CREA dental cream Place 1 application  onto teeth at bedtime.   UNABLE TO FIND CPAP   vitamin B-12 (CYANOCOBALAMIN) 1000 MCG tablet Take 1,000 mcg by mouth daily.   No facility-administered encounter medications on file as of 11/26/2022.    Review of Systems  Constitutional:  Negative for activity change, appetite change, chills, diaphoresis, fatigue, fever and unexpected weight change.  HENT:  Positive for dental problem. Negative for congestion.   Respiratory:  Negative for cough, shortness of breath and wheezing.   Cardiovascular:  Positive for leg swelling. Negative for chest pain and palpitations.  Gastrointestinal:  Negative for abdominal distention, abdominal pain, constipation and diarrhea.  Genitourinary:  Negative for difficulty urinating and dysuria.  Musculoskeletal:  Positive for gait problem. Negative for arthralgias, back pain, joint swelling and myalgias.  Neurological:  Negative for dizziness, tremors, seizures, syncope, facial asymmetry, speech difficulty, weakness, light-headedness, numbness and headaches.  Psychiatric/Behavioral:  Positive for confusion. Negative for  agitation and behavioral problems.     Immunization History  Administered Date(s) Administered   Influenza, High Dose Seasonal PF 02/18/2017, 03/22/2019, 03/14/2020   Influenza,inj,Quad PF,6+ Mos 03/24/2018   Influenza-Unspecified 03/04/2016, 03/18/2020, 02/25/2021, 02/26/2022   Moderna Covid-19 Vaccine Bivalent Booster 53yrs & up 09/18/2021, 03/29/2022   Moderna SARS-COV2 Booster Vaccination 04/03/2020, 01/13/2021   Moderna Sars-Covid-2 Vaccination 06/04/2019, 07/03/2019, 03/04/2021   Pneumococcal Conjugate-13 05/08/2014   Pneumococcal Polysaccharide-23 10/08/2017   Td 07/09/2002, 01/16/2013   Zoster Recombinant(Shingrix) 11/18/2017, 01/17/2018   Zoster, Live 06/30/2010   Pertinent  Health Maintenance Due  Topic Date Due   INFLUENZA  VACCINE  12/23/2022   DEXA SCAN  Completed      08/18/2021   12:45 PM 04/20/2022    2:25 PM 07/07/2022   10:36 AM 08/31/2022   12:48 PM 10/27/2022    1:46 PM  Fall Risk  Falls in the past year? 1 0 0 1 1  Was there an injury with Fall? 0 0 0 1 0  Fall Risk Category Calculator 1 0 0 2 1  Fall Risk Category (Retired) Low Low     (RETIRED) Patient Fall Risk Level Moderate fall risk Moderate fall risk     Patient at Risk for Falls Due to History of fall(s);Impaired balance/gait;Impaired mobility;Orthopedic patient No Fall Risks;History of fall(s);Impaired balance/gait;Orthopedic patient;Impaired mobility History of fall(s) History of fall(s);Impaired balance/gait;Impaired mobility History of fall(s);Impaired balance/gait;Impaired mobility  Patient at Risk for Falls Due to - Comments chronic knee pain Chronic knee pain     Fall risk Follow up Education provided;Falls prevention discussed Falls evaluation completed Falls evaluation completed Falls evaluation completed;Education provided;Falls prevention discussed Falls evaluation completed;Education provided;Falls prevention discussed   Functional Status Survey:    Vitals:   11/26/22 1141  BP: 107/70  Pulse: 63  Resp: 16  Temp: 97.7 F (36.5 C)  TempSrc: Temporal  SpO2: 95%  Weight: 222 lb 11.2 oz (101 kg)  Height: 5' (1.524 m)   Body mass index is 43.49 kg/m. Physical Exam Vitals and nursing note reviewed.  Constitutional:      General: She is not in acute distress.    Appearance: She is not diaphoretic.  HENT:     Head: Normocephalic and atraumatic.     Mouth/Throat:     Mouth: Mucous membranes are moist.     Pharynx: Oropharynx is clear. No oropharyngeal exudate.  Neck:     Vascular: No JVD.  Cardiovascular:     Rate and Rhythm: Normal rate and regular rhythm.     Heart sounds: No murmur heard. Pulmonary:     Effort: Pulmonary effort is normal. No respiratory distress.     Breath sounds: Normal breath sounds. No wheezing.   Abdominal:     General: Bowel sounds are normal. There is no distension.     Palpations: Abdomen is soft.     Tenderness: There is no abdominal tenderness.  Musculoskeletal:     Comments: BLE edema with chronic erythema  Skin:    General: Skin is warm and dry.  Neurological:     General: No focal deficit present.     Mental Status: She is alert. Mental status is at baseline.     Labs reviewed: Recent Labs    01/16/22 0000 07/06/22 0000 07/15/22 0000  NA 143 138 140  K 4.0 4.1 4.0  CL 105 99 102  CO2 27* 22 25*  BUN 13 16 14   CREATININE 0.9 1.0 1.0  CALCIUM 9.1 9.1 9.7  Recent Labs    01/16/22 0000  ALBUMIN 3.3*   Recent Labs    01/16/22 0000 07/06/22 0000  WBC 7.6 8.8  HGB 11.7* 11.6*  HCT 34* 37  PLT 230 225   Lab Results  Component Value Date   TSH 1.77 07/06/2022   Lab Results  Component Value Date   HGBA1C 6.1 03/01/2019   Lab Results  Component Value Date   CHOL 200 10/18/2019   HDL 44 10/18/2019   LDLCALC 129 10/18/2019   TRIG 137 10/18/2019    Significant Diagnostic Results in last 30 days:  CUP PACEART REMOTE DEVICE CHECK  Result Date: 11/10/2022 Scheduled remote reviewed. Normal device function.  Next remote 91 days. LA, CVRS   Assessment/Plan  1. Venous insufficiency Continue lasix and compression hose  2. Chronic diastolic heart failure (HCC) No acute issues Weight stable On ARB therapy and lasix.   3. Obstructive sleep apnea on CPAP Continue CPAP  4. Acquired hypothyroidism Lab Results  Component Value Date   TSH 1.77 07/06/2022    Continue synthroid  5. Vascular dementia without behavioral disturbance (HCC) Progressive decline in cognition and physical function c/w the disease. Continue supportive care in the skilled environment.  6. Mass of upper outer quadrant of left breast No current issues No work up as above  7. Morbid obesity (HCC) Due to her age and dementia not pushing for weight loss.   8.  Depression, major, recurrent, in partial remission (HCC) Well controlled Continue Cymbalta   Family/ staff Communication: NA  Labs/tests ordered:  labs due in August

## 2022-12-01 NOTE — Progress Notes (Signed)
Remote pacemaker transmission.   

## 2022-12-27 ENCOUNTER — Encounter: Payer: Self-pay | Admitting: Adult Health

## 2022-12-27 ENCOUNTER — Non-Acute Institutional Stay (SKILLED_NURSING_FACILITY): Payer: Medicare Other | Admitting: Adult Health

## 2022-12-27 DIAGNOSIS — G4733 Obstructive sleep apnea (adult) (pediatric): Secondary | ICD-10-CM | POA: Diagnosis not present

## 2022-12-27 DIAGNOSIS — F0150 Vascular dementia without behavioral disturbance: Secondary | ICD-10-CM | POA: Diagnosis not present

## 2022-12-27 DIAGNOSIS — I5032 Chronic diastolic (congestive) heart failure: Secondary | ICD-10-CM

## 2022-12-27 DIAGNOSIS — M810 Age-related osteoporosis without current pathological fracture: Secondary | ICD-10-CM | POA: Diagnosis not present

## 2022-12-27 DIAGNOSIS — F3341 Major depressive disorder, recurrent, in partial remission: Secondary | ICD-10-CM

## 2022-12-27 DIAGNOSIS — E039 Hypothyroidism, unspecified: Secondary | ICD-10-CM

## 2022-12-27 DIAGNOSIS — I10 Essential (primary) hypertension: Secondary | ICD-10-CM | POA: Diagnosis not present

## 2022-12-27 NOTE — Progress Notes (Unsigned)
Location:  [facility] Room Number: 124A Place of Service:  SNF [phone]) Provider: Tamsen Roers, MD  Patient Care Team: Mahlon Gammon, MD as PCP - General (Internal Medicine) Quintella Reichert, MD as PCP - Sleep Medicine (Sleep Medicine) Marinus Maw, MD as PCP - Cardiology (Cardiology) Francena Hanly, MD as Consulting Physician (Orthopedic Surgery) Ollen Gross, MD as Consulting Physician (Orthopedic Surgery) Micki Riley, MD as Consulting Physician (Neurology) Marinus Maw, MD as Consulting Physician (Cardiology) Corky Crafts, MD as Consulting Physician (Interventional Cardiology) Mckinley Jewel, MD as Consulting Physician (Ophthalmology) Community, [facility] (Skilled Nursing Facility) Fletcher Anon, NP as Nurse Practitioner (Nurse Practitioner)  Extended Emergency Contact Information Primary Emergency Contact: Kleinman,JED Address: [address]          Ginette Otto [place] Home Phone: [phone] Mobile Phone: [phone] Relation: Son Secondary Emergency Contact: MCGARRY,LEE Mobile Phone: [phone] Relation: Daughter  Code Status:  DNR Goals of care: Advanced Directive information    12/27/2022   11:29 AM  Advanced Directives  Does Patient Have a Medical Advance Directive? Yes  Type of Advance Directive Out of facility DNR (pink MOST or yellow form);Healthcare Power of Attorney  Does patient want to make changes to medical advance directive? No - Patient declined  Copy of Healthcare Power of Attorney in Chart? Yes - validated most recent copy scanned in chart (See row information)     Chief Complaint  Patient presents with   Medical Management of Chronic Issues    Patient is being seen for a routine visit and as follow up   Immunizations    Discuss the need for the flu vaccine    HPI:  Pt is a 89 y.o. female seen today for medical management of  chronic diseases.    PMH significant for breast ca, CAD with stent, subarachnoid hemorrhage, dementia, incontinence, obesity, OP, HTN, diastolic CHF, GERD, sleep apnea, pacemaker, among others. Has a hx of breast lump as well that her family declined mammogram for due to her age and dementia.   She resides in skilled care with dementia. Needs help with ADLS but she is able to feed herself , communicate her needs, and participate in activities. MMSE 27/30 on 09/02/22.  Has been sleeping more and falling asleep at meals. Otherwise no acute complaints. Has lost 5 lbs in the past two months. Did have tooth extraction and infection last month. Uses cpap machine for sleep apnea.  Wt Readings from Last 3 Encounters:  12/27/22 219 lb 4.8 oz (99.5 kg)  11/26/22 222 lb 11.2 oz (101 kg)  10/29/22 224 lb 12.8 oz (102 kg)     Past Medical History:  Diagnosis Date   Breast cancer (HCC)    Colon polyps    Coronary artery disease 06/24/2006   STEMI inferior with BMS to mid RCA with mid basal inferior wall HK and luminal irrgularities elsewhere   Fracture of femoral neck, right (HCC) 06/13/2011   Fracture of humeral head, right, closed 06/13/2011   GERD (gastroesophageal reflux disease)    Hemorrhoids    Hyperlipemia    Hypertension    LBBB (left bundle branch block)    Macular degeneration    Myocardial infarction (HCC)    Obstructive sleep apnea on CPAP    Osteopenia    Osteopenia    Pacemaker 02/25/2014   Rhinitis    Rotator cuff syndrome    SAH (subarachnoid hemorrhage) (HCC) 02/22/2014  Stokes Adams attack 02/22/2014   Ulnar neuropathy    Umbilical hernia    Unstable gait    Vascular dementia without behavioral disturbance (HCC) 07/04/2019   MMSE 10/11/19 28/30, 06/08/20 28/30   Wears hearing aid    Per [facility] records   Past Surgical History:  Procedure Laterality Date   BREAST LUMPECTOMY     CATARACT EXTRACTION, BILATERAL     Per [facility]   CORONARY ANGIOPLASTY  WITH STENT PLACEMENT  06/2006   Mid RCA BMS   HIP ARTHROPLASTY  06/06/2011   Procedure: ARTHROPLASTY BIPOLAR HIP;  Surgeon: Loanne Drilling;  Location: [facility] ORS;  Service: Orthopedics;  Laterality: Right;   PERMANENT PACEMAKER INSERTION N/A 02/25/2014   Procedure: PERMANENT PACEMAKER INSERTION;  Surgeon: Marinus Maw, MD;  Location: [facility] CATH LAB;  Service: Cardiovascular;  Laterality: N/A;   SHOULDER SURGERY     TONSILLECTOMY     Per [facility]    WRIST SURGERY      Allergies  Allergen Reactions   Chlor-Trimeton [Chlorpheniramine] Anaphylaxis   Codeine Anaphylaxis   Nystatin Rash    redness   Amlodipine     Per [facility]     Outpatient Encounter Medications as of 12/27/2022  Medication Sig   acetaminophen (TYLENOL) 500 MG tablet Take 500 mg by mouth every 8 (eight) hours as needed.    ammonium lactate (AMLACTIN) 12 % cream Apply 1 Application topically at bedtime.   aspirin EC 81 MG tablet Take 81 mg by mouth daily.   Carboxymethylcellulose Sodium (ARTIFICIAL TEARS OP) Apply 1 drop to eye as needed.   Chlorhexidine Gluconate 4 % SOLN Apply 1 application topically daily. On Tuesday or Friday   cholecalciferol (VITAMIN D) 25 MCG (1000 UNIT) tablet Take 3,000 Units by mouth daily. In the morning between 8-11 am   donepezil (ARICEPT) 10 MG tablet Take 1 tablet (10 mg total) by mouth at bedtime.   DULoxetine (CYMBALTA) 60 MG capsule Take 60 mg by mouth daily.   Emollient (AQUAPHOR ADV THERAPY HEALING) 41 % OINT Apply topically.   Emollient (CERAVE DAILY MOISTURIZING) LOTN Apply topically daily.   furosemide (LASIX) 20 MG tablet Take 40 mg by mouth. Daily in the afternoon   furosemide (LASIX) 40 MG tablet Take 40 mg by mouth every morning.   furosemide (LASIX) 40 MG tablet Take 40 mg by mouth as needed for edema or fluid.   KETOCONAZOLE, TOPICAL, 1 % SHAM Apply 1 application  topically 2 (two) times a week. Leave on for 15 min then rinse.   levothyroxine  (SYNTHROID, LEVOTHROID) 25 MCG tablet Take 25 mcg by mouth daily before breakfast.    losartan (COZAAR) 100 MG tablet Take 100 mg by mouth daily.   miconazole (MICOTIN) 2 % cream Apply 1 application topically as needed (Apply to rash under breasts and abdominal folds as needed).   Multiple Vitamins-Minerals (PRESERVISION AREDS 2+MULTI VIT PO) Take by mouth 2 (two) times daily.   nebivolol (BYSTOLIC) 10 MG tablet Take 1 tablet (10 mg total) by mouth daily.   nitroGLYCERIN (NITROSTAT) 0.4 MG SL tablet Place 0.4 mg under the tongue every 5 (five) minutes as needed.   ondansetron (ZOFRAN) 4 MG tablet Take 4 mg by mouth every 6 (six) hours as needed for nausea or vomiting.   potassium chloride SA (K-DUR,KLOR-CON) 20 MEQ tablet Take 20 mEq by mouth daily.    pramoxine (SARNA SENSITIVE) 1 % LOTN Apply topically. Daily and as needed  pramoxine (SARNA SENSITIVE) 1 % LOTN Apply 1 Application topically every evening.   sodium fluoride (PREVIDENT 5000 PLUS) 1.1 % CREA dental cream Place 1 application  onto teeth at bedtime.   UNABLE TO FIND CPAP   vitamin B-12 (CYANOCOBALAMIN) 1000 MCG tablet Take 1,000 mcg by mouth daily.   mineral oil-hydrophilic petrolatum (AQUAPHOR) ointment Apply topically as needed for dry skin. (Patient not taking: Reported on 12/27/2022)   No facility-administered encounter medications on file as of 12/27/2022.    Review of Systems  Constitutional:  Positive for fatigue. Negative for activity change, appetite change, chills, diaphoresis, fever and unexpected weight change.  HENT:  Negative for congestion.   Respiratory:  Negative for cough, shortness of breath and wheezing.   Cardiovascular:  Positive for leg swelling. Negative for chest pain and palpitations.  Gastrointestinal:  Negative for abdominal distention, abdominal pain, constipation and diarrhea.  Genitourinary:  Negative for difficulty urinating and dysuria.  Musculoskeletal:  Positive for gait problem. Negative for  arthralgias, back pain, joint swelling and myalgias.  Neurological:  Negative for dizziness, tremors, seizures, syncope, facial asymmetry, speech difficulty, weakness, light-headedness, numbness and headaches.  Psychiatric/Behavioral:  Positive for confusion. Negative for agitation and behavioral problems.     Immunization History  Administered Date(s) Administered   Influenza, High Dose Seasonal PF 02/18/2017, 03/22/2019, 03/14/2020   Influenza,inj,Quad PF,6+ Mos 03/24/2018   Influenza-Unspecified 03/04/2016, 03/18/2020, 02/25/2021, 02/26/2022   Moderna Covid-19 Vaccine Bivalent Booster 39yrs & up 09/18/2021, 03/29/2022   Moderna SARS-COV2 Booster Vaccination 04/03/2020, 01/13/2021   Moderna Sars-Covid-2 Vaccination 06/04/2019, 07/03/2019, 03/04/2021   Pneumococcal Conjugate-13 05/08/2014   Pneumococcal Polysaccharide-23 10/08/2017   Td 07/09/2002, 01/16/2013   Zoster Recombinant(Shingrix) 11/18/2017, 01/17/2018   Zoster, Live 06/30/2010   Pertinent  Health Maintenance Due  Topic Date Due   INFLUENZA VACCINE  12/23/2022   DEXA SCAN  Completed      04/20/2022    2:25 PM 07/07/2022   10:36 AM 08/31/2022   12:48 PM 10/27/2022    1:46 PM 12/27/2022   11:28 AM  Fall Risk  Falls in the past year? 0 0 1 1 0  Was there an injury with Fall? 0 0 1 0 0  Fall Risk Category Calculator 0 0 2 1 0  Fall Risk Category (Retired) Low      (RETIRED) Patient Fall Risk Level Moderate fall risk      Patient at Risk for Falls Due to No Fall Risks;History of fall(s);Impaired balance/gait;Orthopedic patient;Impaired mobility History of fall(s) History of fall(s);Impaired balance/gait;Impaired mobility History of fall(s);Impaired balance/gait;Impaired mobility No Fall Risks  Patient at Risk for Falls Due to - Comments Chronic knee pain      Fall risk Follow up Falls evaluation completed Falls evaluation completed Falls evaluation completed;Education provided;Falls prevention discussed Falls evaluation  completed;Education provided;Falls prevention discussed Falls evaluation completed   Functional Status Survey:    Vitals:   12/27/22 1130  BP: (!) 104/56  Pulse: 68  Resp: 17  Temp: (!) 97.5 F (36.4 C)  TempSrc: Temporal  SpO2: 94%  Weight: 219 lb 4.8 oz (99.5 kg)  Height: 5' (1.524 m)   Body mass index is 42.83 kg/m. Physical Exam Vitals and nursing note reviewed.  Constitutional:      General: She is not in acute distress.    Appearance: She is not diaphoretic.  HENT:     Head: Normocephalic and atraumatic.     Comments: Area of erythema to left cheek, no tenderness or warmth    Mouth/Throat:  Mouth: Mucous membranes are moist.     Pharynx: Oropharynx is clear.  Eyes:     Conjunctiva/sclera: Conjunctivae normal.     Pupils: Pupils are equal, round, and reactive to light.  Neck:     Vascular: No JVD.  Cardiovascular:     Rate and Rhythm: Normal rate and regular rhythm.     Heart sounds: No murmur heard. Pulmonary:     Effort: Pulmonary effort is normal. No respiratory distress.     Breath sounds: Normal breath sounds. No wheezing.  Abdominal:     General: Bowel sounds are normal. There is no distension.     Palpations: Abdomen is soft.     Tenderness: There is no abdominal tenderness.  Musculoskeletal:     Comments: BLE edema +1 with mild erythema which is chronic   Skin:    General: Skin is warm and dry.  Neurological:     General: No focal deficit present.     Mental Status: She is alert. Mental status is at baseline.  Psychiatric:        Mood and Affect: Mood normal.     Labs reviewed: Recent Labs    01/16/22 0000 07/06/22 0000 07/15/22 0000  NA 143 138 140  K 4.0 4.1 4.0  CL 105 99 102  CO2 27* 22 25*  BUN 13 16 14   CREATININE 0.9 1.0 1.0  CALCIUM 9.1 9.1 9.7   Recent Labs    01/16/22 0000  ALBUMIN 3.3*   Recent Labs    01/16/22 0000 07/06/22 0000  WBC 7.6 8.8  HGB 11.7* 11.6*  HCT 34* 37  PLT 230 225   Lab Results   Component Value Date   TSH 1.77 07/06/2022   Lab Results  Component Value Date   HGBA1C 6.1 03/01/2019   Lab Results  Component Value Date   CHOL 200 10/18/2019   HDL 44 10/18/2019   LDLCALC 129 10/18/2019   TRIG 137 10/18/2019    Significant Diagnostic Results in last 30 days:  No results found.  Assessment/Plan  1. Primary hypertension Controlled.   2. Chronic diastolic heart failure (HCC) Compensated with lasix, BB, and ARB  3. Obstructive sleep apnea on CPAP Sleeping more over time Given her age and goals of care would not pursue additional testing.  Continues to be pleasant and participate at [facility]   4. Acquired hypothyroidism Lab Results  Component Value Date   TSH 1.77 07/06/2022   Continue synthroid  5. Vascular dementia without behavioral disturbance (HCC) Progressive decline in cognition and physical function c/w the disease. Continue supportive care in the skilled environment. Sleeping more over time, continue aricept.   6. Age-related osteoporosis without current pathological fracture Prior hx Not ambulatory with dementia and skilled care status WOuld not pursue further work up and treatment  7. Depression, major, recurrent, in partial remission (HCC) Controlled mood with Cymbalta   She had a small area of erythema to the left cheek If not improving report to [facility]   Family/ staff Communication: nurse  Labs/tests ordered:  CBC BMP

## 2022-12-28 ENCOUNTER — Encounter: Payer: Self-pay | Admitting: Adult Health

## 2022-12-30 DIAGNOSIS — I10 Essential (primary) hypertension: Secondary | ICD-10-CM | POA: Diagnosis not present

## 2022-12-30 DIAGNOSIS — D509 Iron deficiency anemia, unspecified: Secondary | ICD-10-CM | POA: Diagnosis not present

## 2022-12-30 LAB — BASIC METABOLIC PANEL
BUN: 12 (ref 4–21)
CO2: 21 (ref 13–22)
Chloride: 102 (ref 99–108)
Creatinine: 1.1 (ref 0.5–1.1)
Glucose: 126
Potassium: 4.4 mEq/L (ref 3.5–5.1)
Sodium: 140 (ref 137–147)

## 2022-12-30 LAB — CBC AND DIFFERENTIAL
HCT: 41 (ref 36–46)
Hemoglobin: 13.1 (ref 12.0–16.0)
Platelets: 239 10*3/uL (ref 150–400)
WBC: 8.3

## 2022-12-30 LAB — COMPREHENSIVE METABOLIC PANEL
Calcium: 9.6 (ref 8.7–10.7)
eGFR: 51

## 2022-12-30 LAB — CBC: RBC: 4.42 (ref 3.87–5.11)

## 2023-01-17 ENCOUNTER — Encounter: Payer: Self-pay | Admitting: Adult Health

## 2023-01-17 ENCOUNTER — Non-Acute Institutional Stay (SKILLED_NURSING_FACILITY): Payer: Medicare Other | Admitting: Adult Health

## 2023-01-17 DIAGNOSIS — N750 Cyst of Bartholin's gland: Secondary | ICD-10-CM

## 2023-01-17 DIAGNOSIS — K644 Residual hemorrhoidal skin tags: Secondary | ICD-10-CM | POA: Diagnosis not present

## 2023-01-17 MED ORDER — MUPIROCIN 2 % EX OINT: 1.0000 | TOPICAL_OINTMENT | Freq: Two times a day (BID) | CUTANEOUS | Status: DC

## 2023-01-17 NOTE — Progress Notes (Signed)
Location:  [facility] Room Number: 124A Place of Service:  SNF [phone]) Provider:  Tamsen Roers, MD  Patient Care Team: Mahlon Gammon, MD as PCP - General (Internal Medicine) Quintella Reichert, MD as PCP - Sleep Medicine (Sleep Medicine) Marinus Maw, MD as PCP - Cardiology (Cardiology) Francena Hanly, MD as Consulting Physician (Orthopedic Surgery) Ollen Gross, MD as Consulting Physician (Orthopedic Surgery) Micki Riley, MD as Consulting Physician (Neurology) Marinus Maw, MD as Consulting Physician (Cardiology) Corky Crafts, MD as Consulting Physician (Interventional Cardiology) Mckinley Jewel, MD as Consulting Physician (Ophthalmology) Community, [facility] (Skilled Nursing Facility) Fletcher Anon, NP as Nurse Practitioner (Nurse Practitioner)  Extended Emergency Contact Information Primary Emergency Contact: [place]i,JED Address: [address]          [place] Home Phone: [phone] Mobile Phone: [phone] Relation: Son Secondary Emergency Contact: MCGARRY,LEE Mobile Phone: [phone] Relation: Daughter  Code Status:  DNR Goals of care: Advanced Directive information    01/17/2023   11:04 AM  Advanced Directives  Does Patient Have a Medical Advance Directive? Yes  Type of Advance Directive Out of facility DNR (pink MOST or yellow form);Healthcare Power of Attorney  Does patient want to make changes to medical advance directive? No - Patient declined  Copy of Healthcare Power of Attorney in Chart? Yes - validated most recent copy scanned in chart (See row information)     Chief Complaint  Patient presents with   Acute Visit    Patient is being seen for vaginal Blisters    Immunizations    Discuss the need for tdap and flu vaccine     HPI:  Pt is a 89 y.o. female seen today for an acute visit for blister to vaginal area and  hemorrhoid  5 blisters reported to the vaginal area. NO pain, no tenderness, no drainage or fever  Also the staff report an external hemorrhoid. No bleeding, swelling, or anal pain reported.    Past Medical History:  Diagnosis Date   Breast cancer (HCC)    Colon polyps    Coronary artery disease 06/24/2006   STEMI inferior with BMS to mid RCA with mid basal inferior wall HK and luminal irrgularities elsewhere   Fracture of femoral neck, right (HCC) 06/13/2011   Fracture of humeral head, right, closed 06/13/2011   GERD (gastroesophageal reflux disease)    Hemorrhoids    Hyperlipemia    Hypertension    LBBB (left bundle branch block)    Macular degeneration    Myocardial infarction (HCC)    Obstructive sleep apnea on CPAP    Osteopenia    Osteopenia    Pacemaker 02/25/2014   Rhinitis    Rotator cuff syndrome    SAH (subarachnoid hemorrhage) (HCC) 02/22/2014   Stokes Adams attack 02/22/2014   Ulnar neuropathy    Umbilical hernia    Unstable gait    Vascular dementia without behavioral disturbance (HCC) 07/04/2019   MMSE 10/11/19 28/30, 06/08/20 28/30   Wears hearing aid    Per [facility] records   Past Surgical History:  Procedure Laterality Date   BREAST LUMPECTOMY     CATARACT EXTRACTION, BILATERAL     Per [facility]   CORONARY ANGIOPLASTY WITH STENT PLACEMENT  06/2006   Mid RCA BMS   HIP ARTHROPLASTY  06/06/2011   Procedure: ARTHROPLASTY BIPOLAR HIP;  Surgeon: Loanne Drilling;  Location: [facility] ORS;  Service: Orthopedics;  Laterality: Right;   PERMANENT PACEMAKER INSERTION N/A 02/25/2014   Procedure: PERMANENT PACEMAKER INSERTION;  Surgeon: Marinus Maw, MD;  Location: [facility] CATH LAB;  Service: Cardiovascular;  Laterality: N/A;   SHOULDER SURGERY     TONSILLECTOMY     Per [facility]    WRIST SURGERY      Allergies  Allergen Reactions   Chlor-Trimeton [Chlorpheniramine] Anaphylaxis   Codeine Anaphylaxis   Nystatin Rash    redness    Amlodipine     Per [facility]     Outpatient Encounter Medications as of 01/17/2023  Medication Sig   acetaminophen (TYLENOL) 500 MG tablet Take 500 mg by mouth every 8 (eight) hours as needed.    ammonium lactate (AMLACTIN) 12 % cream Apply 1 Application topically at bedtime.   aspirin EC 81 MG tablet Take 81 mg by mouth daily.   Carboxymethylcellulose Sodium (ARTIFICIAL TEARS OP) Apply 1 drop to eye as needed.   Chlorhexidine Gluconate 4 % SOLN Apply 1 application topically daily. On Tuesday or Friday   cholecalciferol (VITAMIN D) 25 MCG (1000 UNIT) tablet Take 3,000 Units by mouth daily. In the morning between 8-11 am   donepezil (ARICEPT) 10 MG tablet Take 1 tablet (10 mg total) by mouth at bedtime.   DULoxetine (CYMBALTA) 60 MG capsule Take 60 mg by mouth daily.   Emollient (AQUAPHOR ADV THERAPY HEALING) 41 % OINT Apply topically.   Emollient (CERAVE DAILY MOISTURIZING) LOTN Apply topically daily.   furosemide (LASIX) 20 MG tablet Take 20 mg by mouth daily in the afternoon.   furosemide (LASIX) 40 MG tablet Take 40 mg by mouth every morning.   furosemide (LASIX) 40 MG tablet Take 40 mg by mouth as needed for edema or fluid.   KETOCONAZOLE, TOPICAL, 1 % SHAM Apply 1 application  topically 2 (two) times a week. Leave on for 15 min then rinse.   levothyroxine (SYNTHROID, LEVOTHROID) 25 MCG tablet Take 25 mcg by mouth daily before breakfast.    losartan (COZAAR) 100 MG tablet Take 100 mg by mouth daily.   miconazole (MICOTIN) 2 % cream Apply 1 application topically as needed (Apply to rash under breasts and abdominal folds as needed).   Multiple Vitamins-Minerals (PRESERVISION AREDS 2+MULTI VIT PO) Take by mouth 2 (two) times daily.   nebivolol (BYSTOLIC) 10 MG tablet Take 1 tablet (10 mg total) by mouth daily.   nitroGLYCERIN (NITROSTAT) 0.4 MG SL tablet Place 0.4 mg under the tongue every 5 (five) minutes as needed.   ondansetron (ZOFRAN) 4 MG tablet Take 4 mg by mouth every  6 (six) hours as needed for nausea or vomiting.   potassium chloride SA (K-DUR,KLOR-CON) 20 MEQ tablet Take 20 mEq by mouth daily.    pramoxine (SARNA SENSITIVE) 1 % LOTN Apply topically. Daily and as needed   pramoxine (SARNA SENSITIVE) 1 % LOTN Apply 1 Application topically every evening.   sodium fluoride (PREVIDENT 5000 PLUS) 1.1 % CREA dental cream Place 1 application  onto teeth at bedtime.   UNABLE TO FIND CPAP   vitamin B-12 (CYANOCOBALAMIN) 1000 MCG tablet Take 1,000 mcg by mouth daily.   [DISCONTINUED] furosemide (LASIX) 20 MG tablet Take 40 mg by mouth. Daily in the afternoon (Patient not taking: Reported on 01/17/2023)   No facility-administered encounter medications on file as of 01/17/2023.    Review of Systems  Constitutional:  Negative for activity change, appetite change, chills, diaphoresis, fatigue, fever and unexpected weight change.  HENT:  Negative  for congestion.   Respiratory:  Negative for cough, shortness of breath and wheezing.   Cardiovascular:  Positive for leg swelling. Negative for chest pain and palpitations.  Gastrointestinal:  Negative for abdominal distention, abdominal pain, anal bleeding, blood in stool, constipation, diarrhea, nausea, rectal pain and vomiting.  Genitourinary:  Positive for genital sores. Negative for difficulty urinating, dysuria, flank pain, frequency, hematuria, menstrual problem, pelvic pain and urgency.  Musculoskeletal:  Positive for gait problem. Negative for arthralgias, back pain, joint swelling and myalgias.  Neurological:  Negative for dizziness, tremors, seizures, syncope, facial asymmetry, speech difficulty, weakness, light-headedness, numbness and headaches.  Psychiatric/Behavioral:  Positive for confusion. Negative for agitation and behavioral problems.     Immunization History  Administered Date(s) Administered   Influenza, High Dose Seasonal PF 02/18/2017, 03/22/2019, 03/14/2020   Influenza,inj,Quad PF,6+ Mos 03/24/2018    Influenza-Unspecified 03/04/2016, 03/18/2020, 02/25/2021, 02/26/2022   Moderna Covid-19 Vaccine Bivalent Booster 47yrs & up 09/18/2021, 03/29/2022   Moderna SARS-COV2 Booster Vaccination 04/03/2020, 01/13/2021   Moderna Sars-Covid-2 Vaccination 06/04/2019, 07/03/2019, 03/04/2021   Pneumococcal Conjugate-13 05/08/2014   Pneumococcal Polysaccharide-23 10/08/2017   Td 07/09/2002, 01/16/2013   Zoster Recombinant(Shingrix) 11/18/2017, 01/17/2018   Zoster, Live 06/30/2010   Pertinent  Health Maintenance Due  Topic Date Due   INFLUENZA VACCINE  12/23/2022   DEXA SCAN  Completed      04/20/2022    2:25 PM 07/07/2022   10:36 AM 08/31/2022   12:48 PM 10/27/2022    1:46 PM 12/27/2022   11:28 AM  Fall Risk  Falls in the past year? 0 0 1 1 0  Was there an injury with Fall? 0 0 1 0 0  Fall Risk Category Calculator 0 0 2 1 0  Fall Risk Category (Retired) Low      (RETIRED) Patient Fall Risk Level Moderate fall risk      Patient at Risk for Falls Due to No Fall Risks;History of fall(s);Impaired balance/gait;Orthopedic patient;Impaired mobility History of fall(s) History of fall(s);Impaired balance/gait;Impaired mobility History of fall(s);Impaired balance/gait;Impaired mobility No Fall Risks  Patient at Risk for Falls Due to - Comments Chronic knee pain      Fall risk Follow up Falls evaluation completed Falls evaluation completed Falls evaluation completed;Education provided;Falls prevention discussed Falls evaluation completed;Education provided;Falls prevention discussed Falls evaluation completed   Functional Status Survey:    Vitals:   01/17/23 1102  BP: 115/73  Pulse: 67  Resp: 18  Temp: (!) 97.1 F (36.2 C)  TempSrc: Temporal  SpO2: 92%  Weight: 219 lb 1.6 oz (99.4 kg)  Height: 5' (1.524 m)   Body mass index is 42.79 kg/m. Physical Exam Vitals and nursing note reviewed. Exam conducted with a chaperone present.  Constitutional:      Appearance: Normal appearance.   Genitourinary:    Comments: 5 bartholin cysts noted. Not tender or red.  Small external hemorrhoid noted.  No bleeding, no redness or swelling. NO tender No internal hemorrhoid noted.  Neurological:     Mental Status: She is alert.     Labs reviewed: Recent Labs    07/06/22 0000 07/15/22 0000  NA 138 140  K 4.1 4.0  CL 99 102  CO2 22 25*  BUN 16 14  CREATININE 1.0 1.0  CALCIUM 9.1 9.7   No results for input(s): "AST", "ALT", "ALKPHOS", "BILITOT", "PROT", "ALBUMIN" in the last 8760 hours. Recent Labs    07/06/22 0000  WBC 8.8  HGB 11.6*  HCT 37  PLT 225   Lab Results  Component Value Date   TSH 1.77 07/06/2022   Lab Results  Component Value Date   HGBA1C 6.1 03/01/2019   Lab Results  Component Value Date   CHOL 200 10/18/2019   HDL 44 10/18/2019   LDLCALC 129 10/18/2019   TRIG 137 10/18/2019    Significant Diagnostic Results in last 30 days:  No results found.  Assessment/Plan  1. Bartholin cyst Bactroban ointment bid x 10 days or until resolved Warm compress if she will allow bid until resolved Report if worsening  2. External hemorrhoid Very mild with no bleeding or irritation No treatment indicated   Family/ staff Communication: nurse   Labs/tests ordered:  NA

## 2023-01-31 ENCOUNTER — Non-Acute Institutional Stay (SKILLED_NURSING_FACILITY): Payer: Medicare Other | Admitting: Internal Medicine

## 2023-01-31 DIAGNOSIS — N6321 Unspecified lump in the left breast, upper outer quadrant: Secondary | ICD-10-CM

## 2023-01-31 DIAGNOSIS — G4733 Obstructive sleep apnea (adult) (pediatric): Secondary | ICD-10-CM | POA: Diagnosis not present

## 2023-01-31 DIAGNOSIS — Z950 Presence of cardiac pacemaker: Secondary | ICD-10-CM

## 2023-01-31 DIAGNOSIS — N750 Cyst of Bartholin's gland: Secondary | ICD-10-CM | POA: Diagnosis not present

## 2023-01-31 DIAGNOSIS — E039 Hypothyroidism, unspecified: Secondary | ICD-10-CM | POA: Diagnosis not present

## 2023-01-31 DIAGNOSIS — F0150 Vascular dementia without behavioral disturbance: Secondary | ICD-10-CM

## 2023-01-31 DIAGNOSIS — I5032 Chronic diastolic (congestive) heart failure: Secondary | ICD-10-CM

## 2023-01-31 DIAGNOSIS — I10 Essential (primary) hypertension: Secondary | ICD-10-CM

## 2023-01-31 DIAGNOSIS — F3341 Major depressive disorder, recurrent, in partial remission: Secondary | ICD-10-CM | POA: Diagnosis not present

## 2023-01-31 NOTE — Progress Notes (Signed)
Location:  Medical illustrator of Service:  SNF (31)  Provider:   Code Status: DNR Goals of Care:     01/17/2023   11:04 AM  Advanced Directives  Does Patient Have a Medical Advance Directive? Yes  Type of Advance Directive Out of facility DNR (pink MOST or yellow form);Healthcare Power of Attorney  Does patient want to make changes to medical advance directive? No - Patient declined  Copy of Healthcare Power of Attorney in Chart? Yes - validated most recent copy scanned in chart (See row information)     Chief Complaint  Patient presents with   Care Management    HPI: Patient is a 89 y.o. female seen today for medical management of chronic diseases.    Lives in SNF in [place]   Patient has h/o Chronic Diastolic CHF, Urinary Incontinence, HTN, Osteoporosis, HLD, B 12 Def, anemia, H/o  Breast cancer and Vascular Dementia  h/o SAH and OSA Has h/o Breast lump No Work up per her request   Recent Diagnosis of Bartholin Cyst Patient does not seem to be bothered by it Noticed by staff while proving care  Also 2 episodes of Hot flashes per staff But nothing recently  She is doing well otherwise Wheelchair dependent Feeds herself No Falls Very Pleasantly Confused Wt Readings from Last 3 Encounters:  01/31/23 219 lb (99.3 kg)  01/17/23 219 lb 1.6 oz (99.4 kg)  12/27/22 219 lb 4.8 oz (99.5 kg)    Past Medical History:  Diagnosis Date   Breast cancer (HCC)    Colon polyps    Coronary artery disease 06/24/2006   STEMI inferior with BMS to mid RCA with mid basal inferior wall HK and luminal irrgularities elsewhere   Fracture of femoral neck, right (HCC) 06/13/2011   Fracture of humeral head, right, closed 06/13/2011   GERD (gastroesophageal reflux disease)    Hemorrhoids    Hyperlipemia    Hypertension    LBBB (left bundle branch block)    Macular degeneration    Myocardial infarction (HCC)    Obstructive sleep apnea on CPAP    Osteopenia     Osteopenia    Pacemaker 02/25/2014   Rhinitis    Rotator cuff syndrome    SAH (subarachnoid hemorrhage) (HCC) 02/22/2014   [place] attack 02/22/2014   Ulnar neuropathy    Umbilical hernia    Unstable gait    Vascular dementia without behavioral disturbance (HCC) 07/04/2019   MMSE 10/11/19 28/30, 06/08/20 28/30   Wears hearing aid    Per [facility] records    Past Surgical History:  Procedure Laterality Date   BREAST LUMPECTOMY     CATARACT EXTRACTION, BILATERAL     Per [facility]   CORONARY ANGIOPLASTY WITH STENT PLACEMENT  06/2006   Mid RCA BMS   HIP ARTHROPLASTY  06/06/2011   Procedure: ARTHROPLASTY BIPOLAR HIP;  Surgeon: Gus Rankin Aluisio;  Location: [facility] ORS;  Service: Orthopedics;  Laterality: Right;   PERMANENT PACEMAKER INSERTION N/A 02/25/2014   Procedure: PERMANENT PACEMAKER INSERTION;  Surgeon: Marinus Maw, MD;  Location: [facility] CATH LAB;  Service: Cardiovascular;  Laterality: N/A;   SHOULDER SURGERY     TONSILLECTOMY     Per [facility]    WRIST SURGERY      Allergies  Allergen Reactions   Chlor-Trimeton [Chlorpheniramine] Anaphylaxis   Codeine Anaphylaxis   Nystatin Rash    redness   Amlodipine     Per [facility]  Outpatient Encounter Medications as of 01/31/2023  Medication Sig   acetaminophen (TYLENOL) 500 MG tablet Take 500 mg by mouth every 8 (eight) hours as needed.    ammonium lactate (AMLACTIN) 12 % cream Apply 1 Application topically at bedtime.   aspirin EC 81 MG tablet Take 81 mg by mouth daily.   Carboxymethylcellulose Sodium (ARTIFICIAL TEARS OP) Apply 1 drop to eye as needed.   Chlorhexidine Gluconate 4 % SOLN Apply 1 application topically daily. On Tuesday or Friday   cholecalciferol (VITAMIN D) 25 MCG (1000 UNIT) tablet Take 3,000 Units by mouth daily. In the morning between 8-11 am   donepezil (ARICEPT) 10 MG tablet Take 1 tablet (10 mg total) by mouth at bedtime.   DULoxetine (CYMBALTA) 60 MG capsule  Take 60 mg by mouth daily.   Emollient (AQUAPHOR ADV THERAPY HEALING) 41 % OINT Apply topically.   Emollient (CERAVE DAILY MOISTURIZING) LOTN Apply topically daily.   furosemide (LASIX) 20 MG tablet Take 20 mg by mouth daily in the afternoon.   furosemide (LASIX) 40 MG tablet Take 40 mg by mouth every morning.   furosemide (LASIX) 40 MG tablet Take 40 mg by mouth as needed for edema or fluid.   KETOCONAZOLE, TOPICAL, 1 % SHAM Apply 1 application  topically 2 (two) times a week. Leave on for 15 min then rinse.   levothyroxine (SYNTHROID, LEVOTHROID) 25 MCG tablet Take 25 mcg by mouth daily before breakfast.    losartan (COZAAR) 100 MG tablet Take 100 mg by mouth daily.   miconazole (MICOTIN) 2 % cream Apply 1 application topically as needed (Apply to rash under breasts and abdominal folds as needed).   Multiple Vitamins-Minerals (PRESERVISION AREDS 2+MULTI VIT PO) Take by mouth 2 (two) times daily.   mupirocin ointment (BACTROBAN) 2 % Apply 1 Application topically 2 (two) times daily.   nebivolol (BYSTOLIC) 10 MG tablet Take 1 tablet (10 mg total) by mouth daily.   nitroGLYCERIN (NITROSTAT) 0.4 MG SL tablet Place 0.4 mg under the tongue every 5 (five) minutes as needed.   ondansetron (ZOFRAN) 4 MG tablet Take 4 mg by mouth every 6 (six) hours as needed for nausea or vomiting.   potassium chloride SA (K-DUR,KLOR-CON) 20 MEQ tablet Take 20 mEq by mouth daily.    pramoxine (SARNA SENSITIVE) 1 % LOTN Apply topically. Daily and as needed   pramoxine (SARNA SENSITIVE) 1 % LOTN Apply 1 Application topically every evening.   sodium fluoride (PREVIDENT 5000 PLUS) 1.1 % CREA dental cream Place 1 application  onto teeth at bedtime.   UNABLE TO FIND CPAP   vitamin B-12 (CYANOCOBALAMIN) 1000 MCG tablet Take 1,000 mcg by mouth daily.   No facility-administered encounter medications on file as of 01/31/2023.    Review of Systems:  Review of Systems  Unable to perform ROS: Dementia    Health Maintenance   Topic Date Due   INFLUENZA VACCINE  12/23/2022   DTaP/Tdap/Td (3 - Tdap) 01/17/2023   COVID-19 Vaccine (6 - 2023-24 season) 01/23/2023   Medicare Annual Wellness (AWV)  10/27/2023   Pneumonia Vaccine 69+ Years old  Completed   DEXA SCAN  Completed   Zoster Vaccines- Shingrix  Completed   HPV VACCINES  Aged Out    Physical Exam: Vitals:   01/31/23 2140  BP: 110/61  Pulse: 63  Resp: 18  Temp: 98.1 F (36.7 C)  Weight: 219 lb (99.3 kg)   Body mass index is 42.77 kg/m. Physical Exam Vitals reviewed.  Constitutional:  Appearance: Normal appearance.  HENT:     Head: Normocephalic.     Nose: Nose normal.     Mouth/Throat:     Mouth: Mucous membranes are moist.     Pharynx: Oropharynx is clear.  Eyes:     Pupils: Pupils are equal, round, and reactive to light.  Cardiovascular:     Rate and Rhythm: Normal rate and regular rhythm.     Pulses: Normal pulses.     Heart sounds: Normal heart sounds. No murmur heard. Pulmonary:     Effort: Pulmonary effort is normal.     Breath sounds: Normal breath sounds.  Abdominal:     General: Abdomen is flat. Bowel sounds are normal.     Palpations: Abdomen is soft.  Musculoskeletal:        General: Swelling present.     Cervical back: Neck supple.  Skin:    General: Skin is warm.  Neurological:     General: No focal deficit present.     Mental Status: She is alert.  Psychiatric:        Mood and Affect: Mood normal.        Thought Content: Thought content normal.     Labs reviewed: Basic Metabolic Panel: Recent Labs    07/06/22 0000 07/15/22 0000 12/30/22 0000  NA 138 140 140  K 4.1 4.0 4.4  CL 99 102 102  CO2 22 25* 21  BUN 16 14 12   CREATININE 1.0 1.0 1.1  CALCIUM 9.1 9.7 9.6  TSH 1.77  --   --    Liver Function Tests: No results for input(s): "AST", "ALT", "ALKPHOS", "BILITOT", "PROT", "ALBUMIN" in the last 8760 hours. No results for input(s): "LIPASE", "AMYLASE" in the last 8760 hours. No results for  input(s): "AMMONIA" in the last 8760 hours. CBC: Recent Labs    07/06/22 0000 12/30/22 0000  WBC 8.8 8.3  HGB 11.6* 13.1  HCT 37 41  PLT 225 239   Lipid Panel: No results for input(s): "CHOL", "HDL", "LDLCALC", "TRIG", "CHOLHDL", "LDLDIRECT" in the last 8760 hours. Lab Results  Component Value Date   HGBA1C 6.1 03/01/2019    Procedures since last visit: No results found.  Assessment/Plan 1. Bartholin cyst She cannot do Sitz Bath Will Continue to monitor  She is not having any issues  2. Primary hypertension Losartan and Nebivolol  3. Chronic diastolic heart failure (HCC) Lasix  4. Obstructive sleep apnea on CPAP   5. Acquired hypothyroidism TSH normal in 02/24  6. Vascular dementia without behavioral disturbance (HCC) On Aricept SNF appropriate  7. Depression, major, recurrent, in partial remission (HCC) On Cymbalta  8. Mass of upper outer quadrant of left breast No Work up per her Request  9. Pacemaker     Labs/tests ordered:  * No order type specified * Next appt:  Visit date not found

## 2023-02-06 ENCOUNTER — Encounter: Payer: Self-pay | Admitting: Internal Medicine

## 2023-02-08 ENCOUNTER — Ambulatory Visit: Payer: Medicare Other

## 2023-02-22 ENCOUNTER — Non-Acute Institutional Stay (SKILLED_NURSING_FACILITY): Payer: Self-pay | Admitting: Orthopedic Surgery

## 2023-02-22 ENCOUNTER — Encounter: Payer: Self-pay | Admitting: Orthopedic Surgery

## 2023-02-22 DIAGNOSIS — I4430 Unspecified atrioventricular block: Secondary | ICD-10-CM | POA: Diagnosis not present

## 2023-02-22 DIAGNOSIS — I10 Essential (primary) hypertension: Secondary | ICD-10-CM

## 2023-02-22 DIAGNOSIS — I5032 Chronic diastolic (congestive) heart failure: Secondary | ICD-10-CM

## 2023-02-22 DIAGNOSIS — F0150 Vascular dementia without behavioral disturbance: Secondary | ICD-10-CM | POA: Diagnosis not present

## 2023-02-22 DIAGNOSIS — G4733 Obstructive sleep apnea (adult) (pediatric): Secondary | ICD-10-CM

## 2023-02-22 DIAGNOSIS — I872 Venous insufficiency (chronic) (peripheral): Secondary | ICD-10-CM

## 2023-02-22 DIAGNOSIS — E039 Hypothyroidism, unspecified: Secondary | ICD-10-CM

## 2023-02-22 DIAGNOSIS — N750 Cyst of Bartholin's gland: Secondary | ICD-10-CM

## 2023-02-22 DIAGNOSIS — F339 Major depressive disorder, recurrent, unspecified: Secondary | ICD-10-CM

## 2023-02-22 NOTE — Progress Notes (Signed)
Location:  [facility] Room Number: 124/A Place of Service:  SNF [phone]) Provider:  Octavia Heir, NP   Mahlon Gammon, MD  Patient Care Team: Mahlon Gammon, MD as PCP - General (Internal Medicine) Quintella Reichert, MD as PCP - Sleep Medicine (Sleep Medicine) Marinus Maw, MD as PCP - Cardiology (Cardiology) Francena Hanly, MD as Consulting Physician (Orthopedic Surgery) Ollen Gross, MD as Consulting Physician (Orthopedic Surgery) Micki Riley, MD as Consulting Physician (Neurology) Marinus Maw, MD as Consulting Physician (Cardiology) Corky Crafts, MD as Consulting Physician (Interventional Cardiology) Mckinley Jewel, MD as Consulting Physician (Ophthalmology) Community, [facility] (Skilled Nursing Facility) Fletcher Anon, NP as Nurse Practitioner (Nurse Practitioner)  Extended Emergency Contact Information Primary Emergency Contact: Froemming,JED Address: [address]          [place] Home Phone: [phone] Mobile Phone: [phone] Relation: Son Secondary Emergency Contact: MCGARRY,LEE Mobile Phone: [phone] Relation: Daughter  Code Status:  DNR Goals of care: Advanced Directive information    01/17/2023   11:04 AM  Advanced Directives  Does Patient Have a Medical Advance Directive? Yes  Type of Advance Directive Out of facility DNR (pink MOST or yellow form);Healthcare Power of Attorney  Does patient want to make changes to medical advance directive? No - Patient declined  Copy of Healthcare Power of Attorney in Chart? Yes - validated most recent copy scanned in chart (See row information)     Chief Complaint  Patient presents with   Medical Management of Chronic Issues    HPI:  Pt is a 89 y.o. female seen today for medical management of chronic diseases.    She continues to reside on the skilled nursing unit at [facility]. PMH: aortic insufficiency,  diastolic heart failure, pacemaker, CAD, HTN, MI, OSA on CPAP, GERD, hypothyroidism, obesity, h/o breast cancer and unstable gait.    Venous insufficiency- remains on compression stockings and furosemide daily Chronic CHF-remains on furosemide and weekly weights Vascular dementia- MMSE 27/30 08/2022, CT head 2019 noted moderate atrophy with moderate small vessel ischemic changes of white matter, dependent with ADLs except feeding, ambulates in wheelchair, remains on donepezil   HTN- BUN/creat BUN/creat 12/1.1 12/30/2022, remains on losartan and Bystolic AV block- s/p pacemaker- checked 02/08/2023 OSA- CPAP qhs Depression- no mood changes, remains on Cymbalta, Na + 140 12/30/2022 Hypothyroidism- TSH 1.77 (02/13), remains on levothyroxine Bartholin cyst- no improvement with heat applications, asymptomatic at this time per nursing   Recent blood pressures:  09/24- 110/57  09/17- 107/67  09/10- 110/61  Recent weights:  09/05- 219.1 lbs  08/08- 219.1 lbs  07/11- 219 lbs    Past Medical History:  Diagnosis Date   Breast cancer (HCC)    Colon polyps    Coronary artery disease 06/24/2006   STEMI inferior with BMS to mid RCA with mid basal inferior wall HK and luminal irrgularities elsewhere   Fracture of femoral neck, right (HCC) 06/13/2011   Fracture of humeral head, right, closed 06/13/2011   GERD (gastroesophageal reflux disease)    Hemorrhoids    Hyperlipemia    Hypertension    LBBB (left bundle branch block)    Macular degeneration    Myocardial infarction (HCC)    Obstructive sleep apnea on CPAP    Osteopenia    Osteopenia    Pacemaker 02/25/2014   Rhinitis    Rotator cuff syndrome    SAH (subarachnoid hemorrhage) (HCC) 02/22/2014   Kelle Darting  attack 02/22/2014   Ulnar neuropathy    Umbilical hernia    Unstable gait    Vascular dementia without behavioral disturbance (HCC) 07/04/2019   MMSE 10/11/19 28/30, 06/08/20 28/30   Wears hearing aid    Per [facility] records    Past Surgical History:  Procedure Laterality Date   BREAST LUMPECTOMY     CATARACT EXTRACTION, BILATERAL     Per [facility]   CORONARY ANGIOPLASTY WITH STENT PLACEMENT  06/2006   Mid RCA BMS   HIP ARTHROPLASTY  06/06/2011   Procedure: ARTHROPLASTY BIPOLAR HIP;  Surgeon: Loanne Drilling;  Location: [facility] ORS;  Service: Orthopedics;  Laterality: Right;   PERMANENT PACEMAKER INSERTION N/A 02/25/2014   Procedure: PERMANENT PACEMAKER INSERTION;  Surgeon: Marinus Maw, MD;  Location: [facility] C[facility] LAB;  Service: Cardiovascular;  Laterality: N/A;   SHOULDER SURGERY     TONSILLECTOMY     Per [facility]    WRIST SURGERY      Allergies  Allergen Reactions   Chlor-Trimeton [Chlorpheniramine] Anaphylaxis   Codeine Anaphylaxis   Nystatin Rash    redness   Amlodipine     Per [facility]     Outpatient Encounter Medications as of 02/22/2023  Medication Sig   acetaminophen (TYLENOL) 500 MG tablet Take 500 mg by mouth every 8 (eight) hours as needed.    ammonium lactate (AMLACTIN) 12 % cream Apply 1 Application topically at bedtime.   aspirin EC 81 MG tablet Take 81 mg by mouth daily.   Carboxymethylcellulose Sodium (ARTIFICIAL TEARS OP) Apply 1 drop to eye as needed.   Chlorhexidine Gluconate 4 % SOLN Apply 1 application topically daily. On Tuesday or Friday   cholecalciferol (VITAMIN D) 25 MCG (1000 UNIT) tablet Take 3,000 Units by mouth daily. In the morning between 8-11 am   donepezil (ARICEPT) 10 MG tablet Take 1 tablet (10 mg total) by mouth at bedtime.   DULoxetine (CYMBALTA) 60 MG capsule Take 60 mg by mouth daily.   Emollient (AQUAPHOR ADV THERAPY HEALING) 41 % OINT Apply topically.   Emollient (CERAVE DAILY MOISTURIZING) LOTN Apply topically daily.   furosemide (LASIX) 20 MG tablet Take 20 mg by mouth daily in the afternoon.   furosemide (LASIX) 40 MG tablet Take 40 mg by mouth every morning.   furosemide (LASIX) 40 MG tablet Take 40 mg by mouth as  needed for edema or fluid.   KETOCONAZOLE, TOPICAL, 1 % SHAM Apply 1 application  topically 2 (two) times a week. Leave on for 15 min then rinse.   levothyroxine (SYNTHROID, LEVOTHROID) 25 MCG tablet Take 25 mcg by mouth daily before breakfast.    losartan (COZAAR) 100 MG tablet Take 100 mg by mouth daily.   miconazole (MICOTIN) 2 % cream Apply 1 application topically as needed (Apply to rash under breasts and abdominal folds as needed).   Multiple Vitamins-Minerals (PRESERVISION AREDS 2+MULTI VIT PO) Take by mouth 2 (two) times daily.   mupirocin ointment (BACTROBAN) 2 % Apply 1 Application topically 2 (two) times daily.   nebivolol (BYSTOLIC) 10 MG tablet Take 1 tablet (10 mg total) by mouth daily.   nitroGLYCERIN (NITROSTAT) 0.4 MG SL tablet Place 0.4 mg under the tongue every 5 (five) minutes as needed.   ondansetron (ZOFRAN) 4 MG tablet Take 4 mg by mouth every 6 (six) hours as needed for nausea or vomiting.   potassium chloride SA (K-DUR,KLOR-CON) 20 MEQ tablet Take 20 mEq by mouth daily.    pramoxine (  SARNA SENSITIVE) 1 % LOTN Apply topically. Daily and as needed   pramoxine (SARNA SENSITIVE) 1 % LOTN Apply 1 Application topically every evening.   sodium fluoride (PREVIDENT 5000 PLUS) 1.1 % CREA dental cream Place 1 application  onto teeth at bedtime.   UNABLE TO FIND CPAP   vitamin B-12 (CYANOCOBALAMIN) 1000 MCG tablet Take 1,000 mcg by mouth daily.   No facility-administered encounter medications on file as of 02/22/2023.    Review of Systems  Unable to perform ROS: Dementia    Immunization History  Administered Date(s) Administered   Influenza, High Dose Seasonal PF 02/18/2017, 03/22/2019, 03/14/2020   Influenza,inj,Quad PF,6+ Mos 03/24/2018   Influenza-Unspecified 03/04/2016, 03/18/2020, 02/25/2021, 02/26/2022   Moderna Covid-19 Vaccine Bivalent Booster 48yrs & up 09/18/2021, 03/29/2022   Moderna SARS-COV2 Booster Vaccination 04/03/2020, 01/13/2021   Moderna Sars-Covid-2  Vaccination 06/04/2019, 07/03/2019, 03/04/2021   Pneumococcal Conjugate-13 05/08/2014   Pneumococcal Polysaccharide-23 10/08/2017   Td 07/09/2002, 01/16/2013   Zoster Recombinant(Shingrix) 11/18/2017, 01/17/2018   Zoster, Live 06/30/2010   Pertinent  Health Maintenance Due  Topic Date Due   INFLUENZA VACCINE  12/23/2022   DEXA SCAN  Completed      04/20/2022    2:25 PM 07/07/2022   10:36 AM 08/31/2022   12:48 PM 10/27/2022    1:46 PM 12/27/2022   11:28 AM  Fall Risk  Falls in the past year? 0 0 1 1 0  Was there an injury with Fall? 0 0 1 0 0  Fall Risk Category Calculator 0 0 2 1 0  Fall Risk Category (Retired) Low      (RETIRED) Patient Fall Risk Level Moderate fall risk      Patient at Risk for Falls Due to No Fall Risks;History of fall(s);Impaired balance/gait;Orthopedic patient;Impaired mobility History of fall(s) History of fall(s);Impaired balance/gait;Impaired mobility History of fall(s);Impaired balance/gait;Impaired mobility No Fall Risks  Patient at Risk for Falls Due to - Comments Chronic knee pain      Fall risk Follow up Falls evaluation completed Falls evaluation completed Falls evaluation completed;Education provided;Falls prevention discussed Falls evaluation completed;Education provided;Falls prevention discussed Falls evaluation completed   Functional Status Survey:    Vitals:   02/22/23 1121  BP: (!) 110/57  Pulse: 60  Resp: 18  Temp: (!) 97 F (36.1 C)  SpO2: 92%  Weight: 219 lb 1.6 oz (99.4 kg)  Height: 5' (1.524 m)   Body mass index is 42.79 kg/m. Physical Exam Vitals reviewed.  Constitutional:      General: She is not in acute distress.    Appearance: She is obese.  HENT:     Head: Normocephalic.     Right Ear: There is no impacted cerumen.     Left Ear: There is no impacted cerumen.     Nose: Nose normal.     Mouth/Throat:     Mouth: Mucous membranes are moist.  Eyes:     General:        Right eye: No discharge.        Left eye: No  discharge.     Pupils: Pupils are equal, round, and reactive to light.  Cardiovascular:     Rate and Rhythm: Normal rate and regular rhythm.     Pulses: Normal pulses.     Heart sounds: Normal heart sounds.  Pulmonary:     Effort: Pulmonary effort is normal. No respiratory distress.     Breath sounds: Normal breath sounds. No wheezing or rales.  Abdominal:  General: Bowel sounds are normal.     Palpations: Abdomen is soft.  Musculoskeletal:     Cervical back: Neck supple.     Right lower leg: Edema present.     Left lower leg: Edema present.     Comments: Non pitting  Skin:    General: Skin is warm.     Capillary Refill: Capillary refill takes less than 2 seconds.     Comments: Mild erythema to BLE, R>L, no skin breakdown  Neurological:     General: No focal deficit present.     Mental Status: She is alert. Mental status is at baseline.     Motor: Weakness present.     Gait: Gait abnormal.  Psychiatric:        Mood and Affect: Mood normal.     Comments: Word finding     Labs reviewed: Recent Labs    07/06/22 0000 07/15/22 0000 12/30/22 0000  NA 138 140 140  K 4.1 4.0 4.4  CL 99 102 102  CO2 22 25* 21  BUN 16 14 12   CREATININE 1.0 1.0 1.1  CALCIUM 9.1 9.7 9.6   No results for input(s): "AST", "ALT", "ALKPHOS", "BILITOT", "PROT", "ALBUMIN" in the last 8760 hours. Recent Labs    07/06/22 0000 12/30/22 0000  WBC 8.8 8.3  HGB 11.6* 13.1  HCT 37 41  PLT 225 239   Lab Results  Component Value Date   TSH 1.77 07/06/2022   Lab Results  Component Value Date   HGBA1C 6.1 03/01/2019   Lab Results  Component Value Date   CHOL 200 10/18/2019   HDL 44 10/18/2019   LDLCALC 129 10/18/2019   TRIG 137 10/18/2019    Significant Diagnostic Results in last 30 days:  CUP PACEART REMOTE DEVICE CHECK  Result Date: 02/09/2023 Scheduled remote reviewed. Normal device function.  Battery estimated 5.57mo - route to triage for IFU 3 AMS, 4-6sec in duration Next remote  91 days. LA, CVRS   Assessment/Plan 1. Venous insufficiency - non pitting, mild erythema R>L - cont compression stockings and furosemide  2. Chronic diastolic heart failure (HCC) - compensated - cont furosemide   3. Vascular dementia without behavioral disturbance (HCC) - progressing - MMSE 27/30 08/2022 - no agitation - dependent with ADLs except feeding - cont Aricept  4. Primary hypertension - controlled  - cont losartan  5. AV block - s/p pacemaker 2015  6. Obstructive sleep apnea on CPAP - cont CPAP qhs  7. Recurrent depression (HCC) - no mood changes - Na+ 140 12/2022 - cont Cymbalta  8. Acquired hypothyroidism - TSH stable - cont levothyroxine - TSH scheduled 03/2023  9. Bartholin cyst - unsuccessful trial of heat applications - asymptomatic     Family/ staff Communication: plan discussed with patient and nurse  Labs/tests ordered:  none

## 2023-03-09 ENCOUNTER — Telehealth: Payer: Self-pay | Admitting: Internal Medicine

## 2023-03-09 NOTE — Telephone Encounter (Signed)
Pt's son states that there is something wrong with the pt's pacemaker and needs to speak with someone on this. Please advise

## 2023-03-09 NOTE — Telephone Encounter (Signed)
Spoke to [employer], [place] who informed me the patients device was on an early battery depletion. The office was notified 03/08/23 in the pm prior to closing. Mardelle Matte would like patient to come in to discuss change out 03/10/23 @ 9:30 AM or 03/11/23 @ 10:00 AM. Dr. Ladona Ridgel would like patient put on schedule for gen change Monday 03/14/23.   I discussed information with patients son Gretta Began and patients daughter who were appreciative of call but concerned about urgency of procedure needing to be done. Explained to family due to early battery depletion, [employer] is not able to guarantee how long the battery may last so Dr. Ladona Ridgel did not want to wait. Jed stated he wanted Dr. Ladona Ridgel to call him prior to making any appointments. Advised Jed that Dr. Ladona Ridgel is not working today but will return tomorrow. I personally sent Dr. Ladona Ridgel a text advising about conversation. Requested Jed call me back 03/10/23 am if he has not heard from Dr. Ladona Ridgel. My direct name and number was provided. Jed express appreciation of information and call.

## 2023-03-10 ENCOUNTER — Telehealth: Payer: Self-pay | Admitting: Internal Medicine

## 2023-03-10 NOTE — Telephone Encounter (Signed)
Transmission received 03/10/2023.

## 2023-03-10 NOTE — Telephone Encounter (Signed)
Spoke with [employer] from [facility]. Will fax her Instruction letter for pt's upcoming PPM Gen Change with Dr. Ladona Ridgel on 10/30.  Fax) [phone]  Pt will need labs done when she arrives at hospital the day of procedure.

## 2023-03-10 NOTE — Telephone Encounter (Signed)
Message sent to [facility], CMA to request remote transmission to recheck battery and report info to pts son Jed. Kem stated she called facility but patient was not able to send at that time. Will retry at a later time.

## 2023-03-10 NOTE — Telephone Encounter (Signed)
Misty from [facility] is calling to get any addition orders for this patient for a procedure the patient's daughter states has been scheduled.  Transferred to [place], CMA.

## 2023-03-11 NOTE — Telephone Encounter (Signed)
Called and spoke to patients son Gretta Began, advised battery has estimated <3 months until ERI although he is aware of the advisory for early battery depletion. He was appreciative of call.

## 2023-03-14 ENCOUNTER — Ambulatory Visit: Payer: Medicare Other

## 2023-03-14 DIAGNOSIS — R55 Syncope and collapse: Secondary | ICD-10-CM

## 2023-03-14 LAB — CUP PACEART REMOTE DEVICE CHECK
Battery Remaining Longevity: 1 mo
Battery Remaining Percentage: 1 %
Battery Voltage: 2.63 V
Brady Statistic AP VP Percent: 67 %
Brady Statistic AP VS Percent: 1 %
Brady Statistic AS VP Percent: 27 %
Brady Statistic AS VS Percent: 3.4 %
Brady Statistic RA Percent Paced: 67 %
Brady Statistic RV Percent Paced: 95 %
Date Time Interrogation Session: 20241020200233
Implantable Lead Connection Status: 753985
Implantable Lead Connection Status: 753985
Implantable Lead Implant Date: 20151005
Implantable Lead Implant Date: 20151005
Implantable Lead Location: 753859
Implantable Lead Location: 753860
Implantable Pulse Generator Implant Date: 20151005
Lead Channel Impedance Value: 400 Ohm
Lead Channel Impedance Value: 530 Ohm
Lead Channel Pacing Threshold Amplitude: 0.75 V
Lead Channel Pacing Threshold Amplitude: 1.25 V
Lead Channel Pacing Threshold Pulse Width: 0.5 ms
Lead Channel Pacing Threshold Pulse Width: 0.5 ms
Lead Channel Sensing Intrinsic Amplitude: 12 mV
Lead Channel Sensing Intrinsic Amplitude: 5 mV
Lead Channel Setting Pacing Amplitude: 1 V
Lead Channel Setting Pacing Amplitude: 2.25 V
Lead Channel Setting Pacing Pulse Width: 0.5 ms
Lead Channel Setting Sensing Sensitivity: 4 mV
Pulse Gen Model: 2240
Pulse Gen Serial Number: 7666288

## 2023-03-15 DIAGNOSIS — Z23 Encounter for immunization: Secondary | ICD-10-CM | POA: Diagnosis not present

## 2023-03-21 ENCOUNTER — Telehealth: Payer: Self-pay | Admitting: Internal Medicine

## 2023-03-21 NOTE — Telephone Encounter (Signed)
Spoke with Jed. Went over paper work for procedure (previously April went over with Misty at [facility] and Faxed to them) Told them I was unsure of time for transportation to pick her up after and they would have a better idea that day. Jed stated understanding.

## 2023-03-21 NOTE — Telephone Encounter (Signed)
Pt's son is trying to get instructions for procedure on 10/30. Please advise

## 2023-03-22 NOTE — Pre-Procedure Instructions (Signed)
Instructed patient on the following items: Arrival time 1200 Nothing to eat or drink after midnight No meds AM of procedure Responsible person to drive you home and stay with you for 24 hrs Wash with special soap night before and morning of procedure If on anti-coagulant drug instructions ASA-last dose 10/27

## 2023-03-23 ENCOUNTER — Other Ambulatory Visit: Payer: Self-pay

## 2023-03-23 ENCOUNTER — Encounter (HOSPITAL_COMMUNITY)
Admission: RE | Disposition: A | Discharge status: Home / Self Care | Payer: Self-pay | Source: Home / Self Care | Attending: Internal Medicine

## 2023-03-23 ENCOUNTER — Ambulatory Visit (HOSPITAL_COMMUNITY)
Admission: RE | Admit: 2023-03-23 | Discharge: 2023-03-23 | Disposition: A | Discharge status: Home / Self Care | Payer: Medicare Other | Attending: Internal Medicine | Admitting: Internal Medicine

## 2023-03-23 DIAGNOSIS — Z950 Presence of cardiac pacemaker: Secondary | ICD-10-CM

## 2023-03-23 DIAGNOSIS — I10 Essential (primary) hypertension: Secondary | ICD-10-CM | POA: Insufficient documentation

## 2023-03-23 DIAGNOSIS — I447 Left bundle-branch block, unspecified: Secondary | ICD-10-CM | POA: Diagnosis not present

## 2023-03-23 DIAGNOSIS — Z45010 Encounter for checking and testing of cardiac pacemaker pulse generator [battery]: Secondary | ICD-10-CM | POA: Insufficient documentation

## 2023-03-23 DIAGNOSIS — R600 Localized edema: Secondary | ICD-10-CM | POA: Insufficient documentation

## 2023-03-23 DIAGNOSIS — I442 Atrioventricular block, complete: Secondary | ICD-10-CM | POA: Insufficient documentation

## 2023-03-23 DIAGNOSIS — Z79899 Other long term (current) drug therapy: Secondary | ICD-10-CM | POA: Insufficient documentation

## 2023-03-23 DIAGNOSIS — I5032 Chronic diastolic (congestive) heart failure: Secondary | ICD-10-CM

## 2023-03-23 HISTORY — PX: PPM GENERATOR CHANGEOUT: EP1233

## 2023-03-23 LAB — CBC
HCT: 42.9 % (ref 36.0–46.0)
Hemoglobin: 13.2 g/dL (ref 12.0–15.0)
MCH: 28.9 pg (ref 26.0–34.0)
MCHC: 30.8 g/dL (ref 30.0–36.0)
MCV: 93.9 fL (ref 80.0–100.0)
Platelets: 252 10*3/uL (ref 150–400)
RBC: 4.57 MIL/uL (ref 3.87–5.11)
RDW: 14.5 % (ref 11.5–15.5)
WBC: 7.7 10*3/uL (ref 4.0–10.5)
nRBC: 0 % (ref 0.0–0.2)

## 2023-03-23 SURGERY — PPM GENERATOR CHANGEOUT

## 2023-03-23 MED ORDER — LIDOCAINE HCL (PF) 1 % IJ SOLN
INTRAMUSCULAR | Status: AC
  Filled 2023-03-23: qty 60

## 2023-03-23 MED ORDER — SODIUM CHLORIDE 0.9 % IV SOLN
80.0000 mg | INTRAVENOUS | Status: AC
  Administered 2023-03-23: 80 mg

## 2023-03-23 MED ORDER — SODIUM CHLORIDE 0.9 % IV SOLN: INTRAVENOUS | Status: DC

## 2023-03-23 MED ORDER — LIDOCAINE HCL (PF) 1 % IJ SOLN
INTRAMUSCULAR | Status: DC | PRN
  Administered 2023-03-23: 60 mL

## 2023-03-23 MED ORDER — POVIDONE-IODINE 10 % EX SWAB
2.0000 | Freq: Once | CUTANEOUS | Status: AC
  Administered 2023-03-23: 2 via TOPICAL

## 2023-03-23 MED ORDER — CEFAZOLIN SODIUM-DEXTROSE 2-4 GM/100ML-% IV SOLN
INTRAVENOUS | Status: AC
  Filled 2023-03-23: qty 100

## 2023-03-23 MED ORDER — ONDANSETRON HCL 4 MG/2ML IJ SOLN: 4.0000 mg | Freq: Four times a day (QID) | INTRAMUSCULAR | Status: DC | PRN

## 2023-03-23 MED ORDER — CEFAZOLIN SODIUM-DEXTROSE 2-4 GM/100ML-% IV SOLN
2.0000 g | INTRAVENOUS | Status: AC
  Administered 2023-03-23: 2 g via INTRAVENOUS

## 2023-03-23 MED ORDER — CHLORHEXIDINE GLUCONATE 4 % EX SOLN
4.0000 | Freq: Once | CUTANEOUS | Status: DC
  Filled 2023-03-23: qty 60

## 2023-03-23 MED ORDER — ACETAMINOPHEN 325 MG PO TABS: 325.0000 mg | ORAL_TABLET | ORAL | Status: DC | PRN

## 2023-03-23 MED ORDER — SODIUM CHLORIDE 0.9 % IV SOLN
INTRAVENOUS | Status: AC
  Filled 2023-03-23: qty 2

## 2023-03-23 SURGICAL SUPPLY — 6 items
CABLE SURGICAL S-101-97-12 (CABLE) ×1 IMPLANT
PACEMAKER ASSURITY DR-RF (Pacemaker) IMPLANT
PAD DEFIB RADIO PHYSIO CONN (PAD) ×1 IMPLANT
POUCH AIGIS-R ANTIBACT PPM (Mesh General) ×1 IMPLANT
POUCH AIGIS-R ANTIBACT PPM MED (Mesh General) IMPLANT
TRAY PACEMAKER INSERTION (PACKS) ×1 IMPLANT

## 2023-03-23 NOTE — H&P (Signed)
HPI [name] returns today for followup of her PPM. She is a pleasant 89 yo woman with symptomatic bradycardia and 2:1 AV block and LBBB who underwent PPM insertion approximately 9 years ago. In the interim, she has had no recurrent syncope. She denies chest pain or sob. She has been bothered by peripheral edema. No other complaints today. She is now on lasix as needed. Her dementia has progressed. She reached battery depletion on her device and presents today for PPM gen change out. Allergies       Allergies  Allergen Reactions   Chlorpheniramine Anaphylaxis   Codeine Anaphylaxis   Nystatin Rash      redness   Amlodipine        Per [facility]                 Current Outpatient Medications  Medication Sig Dispense Refill   aspirin EC 81 MG tablet Take 81 mg by mouth daily.       chlorhexidine (HIBICLENS) 4 % external liquid Apply 1 application topically 2 (two) times a week.       Cholecalciferol (VITAMIN D3) 2000 UNITS TABS Take 3,000 Units by mouth daily.        Coenzyme Q10 (COQ10) 200 MG CAPS Take 1 tablet by mouth daily.       donepezil (ARICEPT) 5 MG tablet Take 5 mg by mouth at bedtime.       DULoxetine (CYMBALTA) 60 MG capsule Take 60 mg by mouth daily.       Hypromellose (ARTIFICIAL TEARS OP) Apply 1 drop to eye 2 (two) times daily as needed (dry eyes).       levothyroxine (SYNTHROID, LEVOTHROID) 25 MCG tablet Take 25 mcg by mouth daily before breakfast.        losartan (COZAAR) 100 MG tablet Take 100 mg by mouth daily.       Multiple Vitamins-Minerals (PRESERVISION/LUTEIN) CAPS Take 1 capsule by mouth 2 (two) times daily.       nebivolol (BYSTOLIC) 5 MG tablet Take 1 tablet (5 mg total) by mouth daily. 30 tablet 6   potassium chloride SA (K-DUR,KLOR-CON) 20 MEQ tablet Take 20 mEq by mouth daily.        vitamin B-12 (CYANOCOBALAMIN) 1000 MCG tablet Take 1,000 mcg by mouth daily.       furosemide (LASIX) 40 MG tablet Take 1 tablet (40 mg total) by  mouth daily as needed (swelling or weight gain). 90 tablet 3   nitroGLYCERIN (NITROSTAT) 0.4 MG SL tablet Place 1 tablet (0.4 mg total) under the tongue every 5 (five) minutes as needed for chest pain. 25 tablet 3      No current facility-administered medications for this visit.               Past Medical History:  Diagnosis Date   Breast cancer (HCC)     Colon polyps     Coronary artery disease 06/2006    STEMI inferior with BMS to mid RCA with mid basal inferior wall HK and luminal irrgularities elsewhere   Fracture of femoral neck, right (HCC) 06/13/2011   Fracture of humeral head, right, closed 06/13/2011   GERD (gastroesophageal reflux disease)     Hemorrhoids     Hyperlipemia     Hypertension     LBBB (left bundle branch block)     Macular degeneration     Myocardial infarction (HCC)     Obstructive  sleep apnea on CPAP     Osteopenia     Osteopenia     Pacemaker 02/25/14   Rhinitis     Rotator cuff syndrome     SAH (subarachnoid hemorrhage) (HCC) 02/22/14   Stokes Adams attack 02/22/14   Ulnar neuropathy     Umbilical hernia     Unstable gait            ROS:    All systems reviewed and negative except as noted in the HPI.          Past Surgical History:  Procedure Laterality Date   BREAST LUMPECTOMY       CATARACT EXTRACTION, BILATERAL        Per [facility]   CORONARY ANGIOPLASTY WITH STENT PLACEMENT   06/2006    Mid RCA BMS   HIP ARTHROPLASTY   06/06/2011    Procedure: ARTHROPLASTY BIPOLAR HIP;  Surgeon: Loanne Drilling;  Location: [facility];  Service: Orthopedics;  Laterality: Right;   PERMANENT PACEMAKER INSERTION N/A 02/25/2014    Procedure: PERMANENT PACEMAKER INSERTION;  Surgeon: Marinus Maw, MD;  Location: [facility] CATH LAB;  Service: Cardiovascular;  Laterality: N/A;   SHOULDER SURGERY       TONSILLECTOMY        Per [facility]    WRIST SURGERY                     Family History  Problem Relation Age of Onset   Heart attack  Father 70   CAD Father     CVA Sister              Social History         Socioeconomic History   Marital status: Married      Spouse name: Not on file   Number of children: Not on file   Years of education: Not on file   Highest education level: Not on file  Occupational History   Not on file  Social Needs   Financial resource strain: Not hard at all   Food insecurity:      Worry: Never true      Inability: Never true   Transportation needs:      Medical: No      Non-medical: No  Tobacco Use   Smoking status: Never Smoker   Smokeless tobacco: Never Used  Substance and Sexual Activity   Alcohol use: Yes      Alcohol/week: 0.0 standard drinks      Comment: occasional wine   Drug use: Not on file   Sexual activity: Not on file  Lifestyle   Physical activity:      Days per week: 0 days      Minutes per session: 0 min   Stress: Only a little  Relationships   Social connections:      Talks on phone: More than three times a week      Gets together: More than three times a week      Attends religious service: Never      Active member of club or organization: No      Attends meetings of clubs or organizations: Never      Relationship status: Married   Intimate partner violence:      Fear of current or ex partner: No      Emotionally abused: No      Physically abused: No      Forced sexual activity:  No  Other Topics Concern   Not on file  Social History Narrative   Not on file        Ht 5\' 6"  (1.676 m)   BMI 37.41 kg/m    Physical Exam:   Well appearing NAD HEENT: Unremarkable Neck:  No JVD, no thyromegally Lymphatics:  No adenopathy Back:  No CVA tenderness Lungs:  Clear with no wheezes HEART:  Regular rate rhythm, no murmurs, no rubs, no clicks Abd:  soft, positive bowel sounds, no organomegally, no rebound, no guarding Ext:  2 plus pulses, no edema, no cyanosis, no clubbing Skin:  No rashes no nodules Neuro:  CN II through XII intact, motor  grossly intact   EKG - nsr with ventricular pacing   DEVICE  Normal device function.  See PaceArt for details.    Assess/Plan: 1. CHB - she has no escape at 30 today. She is asymptomatic, s/p PPM. 2. PPM - her [employer] Jude DDD PM is working normally. She is at replacement and will undergo PPM gen change out. 3. HTN - her blood pressure otay was well controlled. We will follow.   Leonia Reeves.D.

## 2023-03-23 NOTE — Discharge Instructions (Signed)

## 2023-03-24 ENCOUNTER — Encounter (HOSPITAL_COMMUNITY): Payer: Self-pay | Admitting: Internal Medicine

## 2023-03-25 ENCOUNTER — Encounter: Payer: Self-pay | Admitting: Adult Health

## 2023-03-25 ENCOUNTER — Non-Acute Institutional Stay (SKILLED_NURSING_FACILITY): Payer: Medicare Other | Admitting: Adult Health

## 2023-03-25 DIAGNOSIS — F3341 Major depressive disorder, recurrent, in partial remission: Secondary | ICD-10-CM

## 2023-03-25 DIAGNOSIS — R634 Abnormal weight loss: Secondary | ICD-10-CM | POA: Diagnosis not present

## 2023-03-25 DIAGNOSIS — F0150 Vascular dementia without behavioral disturbance: Secondary | ICD-10-CM | POA: Diagnosis not present

## 2023-03-25 DIAGNOSIS — I10 Essential (primary) hypertension: Secondary | ICD-10-CM

## 2023-03-25 DIAGNOSIS — Z950 Presence of cardiac pacemaker: Secondary | ICD-10-CM

## 2023-03-25 DIAGNOSIS — G4733 Obstructive sleep apnea (adult) (pediatric): Secondary | ICD-10-CM

## 2023-03-25 DIAGNOSIS — N6321 Unspecified lump in the left breast, upper outer quadrant: Secondary | ICD-10-CM | POA: Diagnosis not present

## 2023-03-25 DIAGNOSIS — E039 Hypothyroidism, unspecified: Secondary | ICD-10-CM

## 2023-03-25 DIAGNOSIS — I872 Venous insufficiency (chronic) (peripheral): Secondary | ICD-10-CM

## 2023-03-25 NOTE — Progress Notes (Addendum)
Location:  Medical illustrator of Service:  SNF (31) Provider: Tamsen Roers, MD  Patient Care Team: Mahlon Gammon, MD as PCP - General (Internal Medicine) Quintella Reichert, MD as PCP - Sleep Medicine (Sleep Medicine) Marinus Maw, MD as PCP - Cardiology (Cardiology) Francena Hanly, MD as Consulting Physician (Orthopedic Surgery) Ollen Gross, MD as Consulting Physician (Orthopedic Surgery) Micki Riley, MD as Consulting Physician (Neurology) Marinus Maw, MD as Consulting Physician (Cardiology) Corky Crafts, MD as Consulting Physician (Interventional Cardiology) Mckinley Jewel, MD as Consulting Physician (Ophthalmology) Community, [facility] (Skilled Nursing Facility) Fletcher Anon, NP as Nurse Practitioner (Nurse Practitioner)  Extended Emergency Contact Information Primary Emergency Contact: Kriesel,JED Address: [address]          [place] Home Phone: [phone] Mobile Phone: [phone] Relation: Son Secondary Emergency Contact: MCGARRY,LEE Mobile Phone: [phone] Relation: Daughter  Code Status:  DNR Goals of care: Advanced Directive information    03/23/2023    1:12 PM  Advanced Directives  Does Patient Have a Medical Advance Directive? Yes  Type of Estate agent of [place];Living will  Does patient want to make changes to medical advance directive? No - Patient declined  Copy of Healthcare Power of Attorney in Chart? No - copy requested     Chief Complaint  Patient presents with   Medical Management of Chronic Issues    HPI:  Pt is a 89 y.o. Richmond seen today for medical management of chronic diseases.    PMH significant for breast ca, CAD with stent, subarachnoid hemorrhage, dementia, incontinence, obesity, OP, HTN, diastolic CHF, GERD, sleep apnea, pacemaker, among others. Has a hx of breast lump as well that her  family declined mammogram for due to her age and dementia.   She resides in skilled care with dementia. Needs help with ADLS but she is able to feed herself , communicate her needs, and participate in activities. MMSE 27/30 on 09/02/22.  She had a pacemaker generator change on 03/23/23. Reports some soreness to the left shoulder.   Has been sleeping more and falling asleep at meals. Otherwise no acute complaints. The nurse reports she continues to lose weight. 7 lbs in the past month   Followed by dentist had tooth extraction, getting partial. Reports some difficulty with dry foods.  Bartholin cyst present, no complaints today.   Uses cpap machine for sleep apnea.   Bps reviewed in matrix  Blood Pressure: 112 / 58 mmHg  Blood Pressure: 110 / 55 mmHg  Blood Pressure: 140 / 70 mmHg  Blood Pressure: 110 / 62 mmHg  Blood Pressure: 104 / 57 mmHg Past Medical History:  Diagnosis Date   Breast cancer (HCC)    Colon polyps    Coronary artery disease 06/24/2006   STEMI inferior with BMS to mid RCA with mid basal inferior wall HK and luminal irrgularities elsewhere   Fracture of femoral neck, right (HCC) 06/13/2011   Fracture of humeral head, right, closed 06/13/2011   GERD (gastroesophageal reflux disease)    Hemorrhoids    Hyperlipemia    Hypertension    LBBB (left bundle branch block)    Macular degeneration    Myocardial infarction (HCC)    Obstructive sleep apnea on CPAP    Osteopenia    Osteopenia    Pacemaker 02/25/2014   Rhinitis    Rotator cuff syndrome    SAH (subarachnoid hemorrhage) (HCC) 02/22/2014  Stokes Adams attack 02/22/2014   Ulnar neuropathy    Umbilical hernia    Unstable gait    Vascular dementia without behavioral disturbance (HCC) 07/04/2019   MMSE 10/11/19 28/30, 06/08/20 28/30   Wears hearing aid    Per [facility] records   Past Surgical History:  Procedure Laterality Date   BREAST LUMPECTOMY     CATARACT EXTRACTION, BILATERAL     Per [facility]   CORONARY ANGIOPLASTY WITH STENT PLACEMENT  06/2006   Mid RCA BMS   HIP ARTHROPLASTY  06/06/2011   Procedure: ARTHROPLASTY BIPOLAR HIP;  Surgeon: Loanne Drilling;  Location: [facility];  Service: Orthopedics;  Laterality: Right;   PERMANENT PACEMAKER INSERTION N/A 02/25/2014   Procedure: PERMANENT PACEMAKER INSERTION;  Surgeon: Marinus Maw, MD;  [facility];  Service: Cardiovascular;  Laterality: N/A;   PPM GENERATOR CHANGEOUT N/A 03/23/2023   Procedure: PPM GENERATOR CHANGEOUT;  Surgeon: Marinus Maw, MD;  Location: [facility] INVASIVE CV LAB;  Service: Cardiovascular;  Laterality: N/A;   SHOULDER SURGERY     TONSILLECTOMY     Per [facility]    WRIST SURGERY      Allergies  Allergen Reactions   Chlor-Trimeton [Chlorpheniramine] Anaphylaxis   Codeine Anaphylaxis   Nystatin Rash    redness   Amlodipine     Per [facility]     Outpatient Encounter Medications as of 03/25/2023  Medication Sig   acetaminophen (TYLENOL) 500 MG tablet Take 500 mg by mouth every 8 (eight) hours as needed for moderate pain (pain score 4-6).   ammonium lactate (AMLACTIN) 12 % cream Apply 1 Application topically at bedtime.   aspirin EC 81 MG tablet Take 81 mg by mouth daily.   Chlorhexidine Gluconate 4 % SOLN Apply 1 application  topically 2 (two) times a week. On Tuesday or Friday   cholecalciferol (VITAMIN D) 25 MCG (1000 UNIT) tablet Take 3,000 Units by mouth daily. In the morning between 8-11 am   Dextran 70-Hypromellose (ARTIFICIAL TEARS PF OP) Place 1 drop into both eyes 2 (two) times daily as needed (dry eyes).   donepezil (ARICEPT) 10 MG tablet Take 1 tablet (10 mg total) by mouth at bedtime.   DULoxetine (CYMBALTA) 60 MG capsule Take 60 mg by mouth daily.   Emollient (AQUAPHOR ADV THERAPY HEALING) 41 % OINT Apply 1 Application topically 2 (two) times daily as needed. Apply to left thigh bump   Emollient (CERAVE DAILY MOISTURIZING) LOTN Apply 1  Application topically every evening.   furosemide (LASIX) 20 MG tablet Take 20 mg by mouth daily in the afternoon.   furosemide (LASIX) 40 MG tablet Take 40 mg by mouth See admin instructions. Take 40 mg by mouth daily, may take a second 40 mg dose as needed for leg swelling   KETOCONAZOLE, TOPICAL, 1 % SHAM Apply 1 application  topically 2 (two) times a week. Leave on for 15 min then rinse.   levothyroxine (SYNTHROID, LEVOTHROID) 25 MCG tablet Take 25 mcg by mouth daily before breakfast.    losartan (COZAAR) 100 MG tablet Take 100 mg by mouth daily.   miconazole (MICOTIN) 2 % cream Apply 1 application  topically 3 (three) times daily as needed (Apply to rash under breasts and abdominal folds as needed).   Multiple Vitamins-Minerals (PRESERVISION AREDS 2+MULTI VIT PO) Take 1 capsule by mouth 2 (two) times daily.   nebivolol (BYSTOLIC) 10 MG tablet Take 1 tablet (10 mg total) by mouth daily.  nitroGLYCERIN (NITROSTAT) 0.4 MG SL tablet Place 0.4 mg under the tongue every 5 (five) minutes as needed for chest pain.   ondansetron (ZOFRAN) 4 MG tablet Take 4 mg by mouth every 6 (six) hours as needed for nausea or vomiting.   potassium chloride SA (K-DUR,KLOR-CON) 20 MEQ tablet Take 20 mEq by mouth daily.    pramoxine (SARNA SENSITIVE) 1 % LOTN Apply 1 Application topically See admin instructions. Apply once daily in the evening, may apply 3 times daily as needed for itchy, dry skin   sodium fluoride (PREVIDENT 5000 PLUS) 1.1 % CREA dental cream Place 1 application  onto teeth at bedtime.   UNABLE TO FIND CPAP   vitamin B-12 (CYANOCOBALAMIN) 1000 MCG tablet Take 1,000 mcg by mouth daily.   No facility-administered encounter medications on file as of 03/25/2023.    Review of Systems  Constitutional:  Positive for fatigue. Negative for activity change, appetite change, chills, diaphoresis, fever and unexpected weight change.  HENT:  Negative for congestion.   Respiratory:  Negative for cough, shortness  of breath and wheezing.   Cardiovascular:  Positive for leg swelling. Negative for chest pain and palpitations.  Gastrointestinal:  Negative for abdominal distention, abdominal pain, constipation and diarrhea.  Genitourinary:  Negative for difficulty urinating and dysuria.  Musculoskeletal:  Positive for gait problem. Negative for arthralgias, back pain, joint swelling and myalgias.  Neurological:  Negative for dizziness, tremors, seizures, syncope, facial asymmetry, speech difficulty, weakness, light-headedness, numbness and headaches.  Psychiatric/Behavioral:  Positive for confusion. Negative for agitation and behavioral problems.     Immunization History  Administered Date(s) Administered   Influenza, High Dose Seasonal PF 02/18/2017, 03/22/2019, 03/14/2020   Influenza,inj,Quad PF,6+ Mos 03/24/2018   Influenza-Unspecified 03/04/2016, 03/18/2020, 02/25/2021, 02/26/2022   Moderna Covid-19 Vaccine Bivalent Booster 45yrs & up 09/18/2021, 03/29/2022   Moderna SARS-COV2 Booster Vaccination 04/03/2020, 01/13/2021   Moderna Sars-Covid-2 Vaccination 06/04/2019, 07/03/2019, 03/04/2021   Pneumococcal Conjugate-13 05/08/2014   Pneumococcal Polysaccharide-23 10/08/2017   Td 07/09/2002, 01/16/2013   Zoster Recombinant(Shingrix) 11/18/2017, 01/17/2018   Zoster, Live 06/30/2010   Pertinent  Health Maintenance Due  Topic Date Due   INFLUENZA VACCINE  12/23/2022   DEXA SCAN  Completed      04/20/2022    2:25 PM 07/07/2022   10:36 AM 08/31/2022   12:48 PM 10/27/2022    1:46 PM 12/27/2022   11:28 AM  Fall Risk  Falls in the past year? 0 0 1 1 0  Was there an injury with Fall? 0 0 1 0 0  Fall Risk Category Calculator 0 0 2 1 0  Fall Risk Category (Retired) Low      (RETIRED) Patient Fall Risk Level Moderate fall risk      Patient at Risk for Falls Due to No Fall Risks;History of fall(s);Impaired balance/gait;Orthopedic patient;Impaired mobility History of fall(s) History of fall(s);Impaired  balance/gait;Impaired mobility History of fall(s);Impaired balance/gait;Impaired mobility No Fall Risks  Patient at Risk for Falls Due to - Comments Chronic knee pain      Fall risk Follow up Falls evaluation completed Falls evaluation completed Falls evaluation completed;Education provided;Falls prevention discussed Falls evaluation completed;Education provided;Falls prevention discussed Falls evaluation completed   Functional Status Survey:    Vitals:   03/25/23 0847  Weight: 212 lb 9.6 oz (96.4 kg)   Body mass index is 41.52 kg/m. Physical Exam Vitals and nursing note reviewed.  Constitutional:      General: She is not in acute distress.    Appearance: She is  not diaphoretic.  HENT:     Head: Normocephalic and atraumatic.     Mouth/Throat:     Mouth: Mucous membranes are moist.     Pharynx: Oropharynx is clear.  Eyes:     Conjunctiva/sclera: Conjunctivae normal.     Pupils: Pupils are equal, round, and reactive to light.  Neck:     Vascular: No JVD.  Cardiovascular:     Rate and Rhythm: Normal rate and regular rhythm.     Heart sounds: No murmur heard. Pulmonary:     Effort: Pulmonary effort is normal. No respiratory distress.     Breath sounds: Normal breath sounds. No wheezing.  Abdominal:     General: Bowel sounds are normal. There is no distension.     Palpations: Abdomen is soft.     Tenderness: There is no abdominal tenderness.  Musculoskeletal:     Comments: BLE edema +1 with mild erythema which is chronic   Skin:    General: Skin is warm and dry.     Comments: Left upper chest incision with steri strips no redness or drainage.   Neurological:     General: No focal deficit present.     Mental Status: She is alert. Mental status is at baseline.  Psychiatric:        Mood and Affect: Mood normal.     Labs reviewed: Recent Labs    07/06/22 0000 07/15/22 0000 12/30/22 0000  NA 138 140 140  K 4.1 4.0 4.4  CL 99 102 102  CO2 22 25* 21  BUN 16 14 12    CREATININE 1.0 1.0 1.1  CALCIUM 9.1 9.7 9.6   No results for input(s): "AST", "ALT", "ALKPHOS", "BILITOT", "PROT", "ALBUMIN" in the last 8760 hours.  Recent Labs    07/06/22 0000 12/30/22 0000 03/23/23 1305  WBC 8.8 8.3 7.7  HGB 11.6* 13.1 13.2  HCT 37 41 42.9  MCV  --   --  Rachel.9  PLT 225 239 252   Lab Results  Component Value Date   TSH 1.77 07/06/2022   Lab Results  Component Value Date   HGBA1C 6.1 03/01/2019   Lab Results  Component Value Date   CHOL 200 10/18/2019   HDL 44 10/18/2019   LDLCALC 129 10/18/2019   TRIG 137 10/18/2019    Significant Diagnostic Results in last 30 days:  EP PPM/ICD IMPLANT  Result Date: 03/23/2023 Conclusion: Successful removal of her previously implanted dual-chamber pacemaker and insertion of a new dual-chamber pacemaker in a patient with complete heart block whose device had reached replacement and was under recall. Lewayne Bunting, MD   CUP PACEART REMOTE DEVICE CHECK  Result Date: 03/14/2023 Scheduled remote reviewed. Normal device function.  The device estimates 1 month until ERI Next remote 04/14/2023. Hassell Halim, RN, CCDS   Assessment/Plan  1. Weight loss Due to sleeping more and dental issues Recommend softer foods (could not eat the toast or bacon easily at breakfast today) TSH due later this month Consider discontinuing aricept of weight continues to drop  2. Pacemaker Incision site looks ok Can use tylenol for pain   3. Venous insufficiency Continue compression hose and lasix.   4. Primary hypertension Controlled Continue losartan   5. Obstructive sleep apnea on CPAP Cpap qhs  6. Acquired hypothyroidism Manufacturer change to levothyroxine so lab will be drawn later this month per pharmacy recommendation   7. Vascular dementia without behavioral disturbance (HCC) Progressive decline in cognition and physical function c/w the disease. Continue  supportive care in the skilled environment.  8. Mass of  upper outer quadrant of left breast No work up due to goals of care.   9. Depression, major, recurrent, in partial remission (HCC) Does not appear depressed Continue Cymbalta  Recommend tdap vaccine     Family/ staff Communication: nurse  Labs/tests ordered:  TSH

## 2023-03-30 ENCOUNTER — Telehealth: Payer: Self-pay | Admitting: Internal Medicine

## 2023-03-30 NOTE — Progress Notes (Signed)
Remote pacemaker transmission.   

## 2023-03-30 NOTE — Telephone Encounter (Signed)
Son [name]) wants to have wound check follow-up done at [facility] as patient is in a wheel chair.

## 2023-03-30 NOTE — Telephone Encounter (Signed)
Spoke with son, explained that this appointment is not just for wound and that we also interrogate the device and go over next step instructions.  This appointment needs to be in person.   Patient's son verbalizes understanding and she will be present.

## 2023-04-02 DIAGNOSIS — R0602 Shortness of breath: Secondary | ICD-10-CM | POA: Diagnosis not present

## 2023-04-02 LAB — HEPATIC FUNCTION PANEL
ALT: 7 U/L (ref 7–35)
AST: 16 (ref 13–35)
Alkaline Phosphatase: 107 (ref 25–125)
Bilirubin, Total: 1.2

## 2023-04-02 LAB — COMPREHENSIVE METABOLIC PANEL
Albumin: 3.2 — AB (ref 3.5–5.0)
Calcium: 9.5 (ref 8.7–10.7)
eGFR: 47

## 2023-04-02 LAB — CBC AND DIFFERENTIAL
HCT: 41 (ref 36–46)
Hemoglobin: 12.9 (ref 12.0–16.0)
Neutrophils Absolute: 4
Platelets: 221 10*3/uL (ref 150–400)
WBC: 7.4

## 2023-04-02 LAB — BASIC METABOLIC PANEL
BUN: 16 (ref 4–21)
CO2: 26 — AB (ref 13–22)
Chloride: 102 (ref 99–108)
Creatinine: 1.1 (ref 0.5–1.1)
Glucose: 98
Potassium: 4 meq/L (ref 3.5–5.1)
Sodium: 142 (ref 137–147)

## 2023-04-02 LAB — CBC: RBC: 4.56 (ref 3.87–5.11)

## 2023-04-03 ENCOUNTER — Other Ambulatory Visit: Payer: Self-pay | Admitting: Internal Medicine

## 2023-04-03 ENCOUNTER — Telehealth: Payer: Self-pay | Admitting: Internal Medicine

## 2023-04-03 ENCOUNTER — Telehealth: Payer: Self-pay | Admitting: Physician Assistant

## 2023-04-03 ENCOUNTER — Other Ambulatory Visit: Payer: Self-pay | Admitting: Adult Health

## 2023-04-03 MED ORDER — LORAZEPAM 0.5 MG PO TABS: 0.5000 mg | ORAL_TABLET | Freq: Two times a day (BID) | ORAL | Status: DC | PRN

## 2023-04-03 MED ORDER — MORPHINE SULFATE (CONCENTRATE) 20 MG/ML PO SOLN: 5.0000 mg | Freq: Four times a day (QID) | ORAL | Status: DC | PRN

## 2023-04-03 NOTE — Telephone Encounter (Signed)
I was called on 04/02/2023 for Patient c/o SOB Ordered Chest Xray and ProBNP Xray positive for CHF Lasix changed to 40 mg BID for  1 week Also Gave extra dose of Lasix Also Will reduce her Losartan to 50 mg due to BP being low Will follow up on Mon. Family wants her to be comfortable No aggressive measures

## 2023-04-03 NOTE — Progress Notes (Signed)
Charge nurse called on-call regarding chest pain. BNP was reported to be 7200. Family does not want her transferred to hospital and wants to keep her comfortable.

## 2023-04-03 NOTE — Telephone Encounter (Addendum)
Pt w/ hx PPM replacement 10/30. Pt is DNR, in Skilled Care w/ dementia.  She has begun having chest pain, under L breast. She has had Morphine for pain.   The staff called her son having end-of-life discussions.  Although it is quite likely she will not be taken to the hospital, her son wishes to see if we can figure out what is going on so we can treat her appropriately.  My suggestion was to asked the staff to get an EKG and fax it to me.  I also requested that they initiate a remote transmission and then call the answering service so I will know that is completed and can get the results from the rep.  Her son was out of state but is flying back to [place] and hopes to be there within a few hours.   Theodore Demark, PA-C 04/03/2023 3:29 PM   Received a call from Tiffany at [facility].  She is the nurse on duty and has been working with Ms. Shea Evans.  She received a call from the doctor's office that her BNP was elevated at 7212.   The patient has not had any palpitations.  Although she got short of breath this a.m. and got morphine, that shortness of breath resolved and has not returned.   She has had a normal activity level this afternoon, and is currently sitting and eating dinner.  Since she has not had palpitations or presyncope, do not think that a device interrogation or ECG is needed.  In a discussion of her vital signs, Ms. Kovatch's systolic blood pressure has been between 95 and 110.  Her current Lasix dose is 40 mg twice daily.  She says they have been weighing her, but she does not have the weights at hand.  Dr. Chales Abrahams comes to the facility and will see Ms. Miyamoto tomorrow.  I think it is acceptable to follow her for symptoms, and let her be seen by the doctor tomorrow.  Her blood pressure is not high enough to increase her Lasix further at this time.  Theodore Demark, PA-C 04/03/2023 5:40 PM

## 2023-04-04 ENCOUNTER — Encounter: Payer: Self-pay | Admitting: Internal Medicine

## 2023-04-04 ENCOUNTER — Non-Acute Institutional Stay (SKILLED_NURSING_FACILITY): Payer: Self-pay | Admitting: Internal Medicine

## 2023-04-04 DIAGNOSIS — R079 Chest pain, unspecified: Secondary | ICD-10-CM | POA: Diagnosis not present

## 2023-04-04 DIAGNOSIS — Z7189 Other specified counseling: Secondary | ICD-10-CM | POA: Diagnosis not present

## 2023-04-04 DIAGNOSIS — F0150 Vascular dementia without behavioral disturbance: Secondary | ICD-10-CM

## 2023-04-04 DIAGNOSIS — I5033 Acute on chronic diastolic (congestive) heart failure: Secondary | ICD-10-CM | POA: Diagnosis not present

## 2023-04-04 DIAGNOSIS — Z950 Presence of cardiac pacemaker: Secondary | ICD-10-CM | POA: Diagnosis not present

## 2023-04-04 DIAGNOSIS — I10 Essential (primary) hypertension: Secondary | ICD-10-CM

## 2023-04-04 MED ORDER — LORAZEPAM 0.5 MG PO TABS: 0.5000 mg | ORAL_TABLET | Freq: Two times a day (BID) | ORAL | 0 refills | Status: DC | PRN

## 2023-04-04 NOTE — Progress Notes (Signed)
Location:  [facility] Room Number: 124/A Place of Service:  SNF [phone]) Provider:  Mahlon Gammon, MD   Mahlon Gammon, MD  Patient Care Team: Mahlon Gammon, MD as PCP - General (Internal Medicine) Quintella Reichert, MD as PCP - Sleep Medicine (Sleep Medicine) Marinus Maw, MD as PCP - Cardiology (Cardiology) Francena Hanly, MD as Consulting Physician (Orthopedic Surgery) Ollen Gross, MD as Consulting Physician (Orthopedic Surgery) Micki Riley, MD as Consulting Physician (Neurology) Marinus Maw, MD as Consulting Physician (Cardiology) Corky Crafts, MD as Consulting Physician (Interventional Cardiology) Mckinley Jewel, MD as Consulting Physician (Ophthalmology) Community, [facility] (Skilled Nursing Facility) Fletcher Anon, NP as Nurse Practitioner (Nurse Practitioner)  Extended Emergency Contact Information Primary Emergency Contact: Kilmartin,JED Address: [address]          [place] Home Phone: [phone] Mobile Phone: [phone] Relation: Son Secondary Emergency Contact: MCGARRY,LEE Mobile Phone: [phone] Relation: Daughter  Code Status:  DNR Goals of care: Advanced Directive information    04/04/2023    9:17 AM  Advanced Directives  Does Patient Have a Medical Advance Directive? Yes  Type of Estate agent of [place];Living will  Does patient want to make changes to medical advance directive? No - Patient declined  Copy of Healthcare Power of Attorney in Chart? No - copy requested     Chief Complaint  Patient presents with   Acute Visit    Patient is being seen for CHF    HPI:  Pt is a 89 y.o. female seen today for an acute visit for CHF   Lives in SNF in [place]   Patient has h/o Chronic Diastolic CHF, Urinary Incontinence, HTN, Osteoporosis, HLD, B 12 Def, anemia, H/o  Breast cancer and Vascular Dementia  h/o SAH and  OSA Has h/o Breast lump No Work up per her request  Patient underwent PPM GEN change out on 10/31  Over the weekend patient started complaining of shortness of breath. Chest x-ray showed pulmonary vascular congestion.  Her other labs were normal except her BNP which was elevated at 7000. I increased her Lasix to 40 mg twice daily and also gave her extra dose of Lasix. But then patient started having chest pain  her family did not want any aggressive measures and did not want her to go to ED.  She was started on Roxanol and Ativan. Today patient is back to her baseline her shortness of breath is resolved she denies any chest pain.  Her son and daughter are with her and wanted to discussed MOST form. Patient does have dementia.  With recent worsening in her cognition.  She has lost some weight because she is having some issues with her teeth. Patient also sometimes complains of hot flashes which has been her chronic problem.  Past Medical History:  Diagnosis Date   Breast cancer (HCC)    Colon polyps    Coronary artery disease 06/24/2006   STEMI inferior with BMS to mid RCA with mid basal inferior wall HK and luminal irrgularities elsewhere   Fracture of femoral neck, right (HCC) 06/13/2011   Fracture of humeral head, right, closed 06/13/2011   GERD (gastroesophageal reflux disease)    Hemorrhoids    Hyperlipemia    Hypertension    LBBB (left bundle branch block)    Macular degeneration    Myocardial infarction (HCC)    Obstructive sleep apnea on CPAP    Osteopenia  Osteopenia    Pacemaker 02/25/2014   Rhinitis    Rotator cuff syndrome    SAH (subarachnoid hemorrhage) (HCC) 02/22/2014   Stokes Adams attack 02/22/2014   Ulnar neuropathy    Umbilical hernia    Unstable gait    Vascular dementia without behavioral disturbance (HCC) 07/04/2019   MMSE 10/11/19 28/30, 06/08/20 28/30   Wears hearing aid    Per [facility] records   Past Surgical History:  Procedure Laterality Date    BREAST LUMPECTOMY     CATARACT EXTRACTION, BILATERAL     Per [facility]   CORONARY ANGIOPLASTY WITH STENT PLACEMENT  06/2006   Mid RCA BMS   HIP ARTHROPLASTY  06/06/2011   Procedure: ARTHROPLASTY BIPOLAR HIP;  Surgeon: Loanne Drilling;  Location: [facility] ORS;  Service: Orthopedics;  Laterality: Right;   PERMANENT PACEMAKER INSERTION N/A 02/25/2014   Procedure: PERMANENT PACEMAKER INSERTION;  Surgeon: Marinus Maw, MD;  Location: [facility] CATH LAB;  Service: Cardiovascular;  Laterality: N/A;   PPM GENERATOR CHANGEOUT N/A 03/23/2023   Procedure: PPM GENERATOR CHANGEOUT;  Surgeon: Marinus Maw, MD;  Location: [facility] INVASIVE CV LAB;  Service: Cardiovascular;  Laterality: N/A;   SHOULDER SURGERY     TONSILLECTOMY     Per [facility]    WRIST SURGERY      Allergies  Allergen Reactions   Chlor-Trimeton [Chlorpheniramine] Anaphylaxis   Codeine Anaphylaxis   Nystatin Rash    redness   Amlodipine     Per [facility]     Outpatient Encounter Medications as of 04/04/2023  Medication Sig   acetaminophen (TYLENOL) 500 MG tablet Take 500 mg by mouth every 8 (eight) hours as needed for moderate pain (pain score 4-6).   ammonium lactate (AMLACTIN) 12 % cream Apply 1 Application topically at bedtime.   aspirin EC 81 MG tablet Take 81 mg by mouth daily.   Chlorhexidine Gluconate 4 % SOLN Apply 1 application  topically 2 (two) times a week. On Tuesday or Friday   cholecalciferol (VITAMIN D) 25 MCG (1000 UNIT) tablet Take 3,000 Units by mouth daily. In the morning between 8-11 am   Dextran 70-Hypromellose (ARTIFICIAL TEARS PF OP) Place 1 drop into both eyes 2 (two) times daily as needed (dry eyes).   donepezil (ARICEPT) 10 MG tablet Take 1 tablet (10 mg total) by mouth at bedtime.   DULoxetine (CYMBALTA) 60 MG capsule Take 60 mg by mouth daily.   Emollient (AQUAPHOR ADV THERAPY HEALING) 41 % OINT Apply 1 Application topically 2 (two) times daily as needed. Apply to left  thigh bump   Emollient (CERAVE DAILY MOISTURIZING) LOTN Apply 1 Application topically every evening.   furosemide (LASIX) 20 MG tablet Take 20 mg by mouth in the morning and at bedtime.   furosemide (LASIX) 40 MG tablet Take 40 mg by mouth See admin instructions. Take 40 mg by mouth daily, may take a second 40 mg dose as needed for leg swelling   furosemide (LASIX) 40 MG tablet Take 40 mg by mouth 2 (two) times daily.   KETOCONAZOLE, TOPICAL, 1 % SHAM Apply 1 application  topically 2 (two) times a week. Leave on for 15 min then rinse.   levothyroxine (SYNTHROID, LEVOTHROID) 25 MCG tablet Take 25 mcg by mouth daily before breakfast.    LORazepam (ATIVAN) 0.5 MG tablet Take 1 tablet (0.5 mg total) by mouth 2 (two) times daily as needed for anxiety.   losartan (COZAAR) 50 MG tablet Take 50 mg  by mouth daily.   miconazole (MICOTIN) 2 % cream Apply 1 application  topically 3 (three) times daily as needed (Apply to rash under breasts and abdominal folds as needed).   morphine (ROXANOL) 20 MG/ML concentrated solution Take 0.25 mLs (5 mg total) by mouth every 6 (six) hours as needed for severe pain (pain score 7-10).   Multiple Vitamins-Minerals (PRESERVISION AREDS 2+MULTI VIT PO) Take 1 capsule by mouth 2 (two) times daily.   nebivolol (BYSTOLIC) 10 MG tablet Take 1 tablet (10 mg total) by mouth daily.   nitroGLYCERIN (NITROSTAT) 0.4 MG SL tablet Place 0.4 mg under the tongue every 5 (five) minutes as needed for chest pain.   ondansetron (ZOFRAN) 4 MG tablet Take 4 mg by mouth every 6 (six) hours as needed for nausea or vomiting.   potassium chloride SA (K-DUR,KLOR-CON) 20 MEQ tablet Take 20 mEq by mouth daily.    pramoxine (SARNA SENSITIVE) 1 % LOTN Apply 1 Application topically See admin instructions. Apply once daily in the evening, may apply 3 times daily as needed for itchy, dry skin   sodium fluoride (PREVIDENT 5000 PLUS) 1.1 % CREA dental cream Place 1 application  onto teeth at bedtime.   UNABLE  TO FIND CPAP   vitamin B-12 (CYANOCOBALAMIN) 1000 MCG tablet Take 1,000 mcg by mouth daily.   [DISCONTINUED] furosemide (LASIX) 20 MG tablet Take 20 mg by mouth daily in the afternoon.   [DISCONTINUED] losartan (COZAAR) 100 MG tablet Take 100 mg by mouth daily.   No facility-administered encounter medications on file as of 04/04/2023.    Review of Systems  Unable to perform ROS: Dementia    Immunization History  Administered Date(s) Administered   Fluad Quad(high Dose 65+) 03/15/2023   Influenza, High Dose Seasonal PF 02/18/2017, 03/22/2019, 03/14/2020   Influenza,inj,Quad PF,6+ Mos 03/24/2018   Influenza-Unspecified 03/04/2016, 03/18/2020, 02/25/2021, 02/26/2022   Moderna Covid-19 Vaccine Bivalent Booster 18yrs & up 09/18/2021, 03/29/2022   Moderna SARS-COV2 Booster Vaccination 04/03/2020, 01/13/2021, 03/15/2023   Moderna Sars-Covid-2 Vaccination 06/04/2019, 07/03/2019, 03/04/2021   Pneumococcal Conjugate-13 05/08/2014   Pneumococcal Polysaccharide-23 10/08/2017   Td 07/09/2002, 01/16/2013   Tdap 03/26/2023   Zoster Recombinant(Shingrix) 11/18/2017, 01/17/2018   Zoster, Live 06/30/2010   Pertinent  Health Maintenance Due  Topic Date Due   INFLUENZA VACCINE  Completed   DEXA SCAN  Completed      04/20/2022    2:25 PM 07/07/2022   10:36 AM 08/31/2022   12:48 PM 10/27/2022    1:46 PM 12/27/2022   11:28 AM  Fall Risk  Falls in the past year? 0 0 1 1 0  Was there an injury with Fall? 0 0 1 0 0  Fall Risk Category Calculator 0 0 2 1 0  Fall Risk Category (Retired) Low      (RETIRED) Patient Fall Risk Level Moderate fall risk      Patient at Risk for Falls Due to No Fall Risks;History of fall(s);Impaired balance/gait;Orthopedic patient;Impaired mobility History of fall(s) History of fall(s);Impaired balance/gait;Impaired mobility History of fall(s);Impaired balance/gait;Impaired mobility No Fall Risks  Patient at Risk for Falls Due to - Comments Chronic knee pain      Fall risk  Follow up Falls evaluation completed Falls evaluation completed Falls evaluation completed;Education provided;Falls prevention discussed Falls evaluation completed;Education provided;Falls prevention discussed Falls evaluation completed   Functional Status Survey:    Vitals:   04/04/23 0915  BP: 100/76  Pulse: 61  Resp: 18  Temp: (!) 97 F (36.1 C)  TempSrc:  Temporal  SpO2: 93%  Weight: 213 lb 6.4 oz (96.8 kg)  Height: 5' (1.524 m)   Body mass index is 41.68 kg/m. Physical Exam Vitals reviewed.  Constitutional:      Appearance: Normal appearance.  HENT:     Head: Normocephalic.     Nose: Nose normal.     Mouth/Throat:     Mouth: Mucous membranes are moist.     Pharynx: Oropharynx is clear.  Eyes:     Pupils: Pupils are equal, round, and reactive to light.  Cardiovascular:     Rate and Rhythm: Normal rate and regular rhythm.     Pulses: Normal pulses.     Heart sounds: Normal heart sounds. No murmur heard. Pulmonary:     Effort: Pulmonary effort is normal.     Breath sounds: Rales present.  Abdominal:     General: Abdomen is flat. Bowel sounds are normal.     Palpations: Abdomen is soft.  Musculoskeletal:        General: Swelling present.     Cervical back: Neck supple.  Skin:    General: Skin is warm.  Neurological:     General: No focal deficit present.     Mental Status: She is alert.  Psychiatric:        Mood and Affect: Mood normal.        Thought Content: Thought content normal.     Labs reviewed: Recent Labs    07/15/22 0000 12/30/22 0000 04/02/23 0000  NA 140 140 142  K 4.0 4.4 4.0  CL 102 102 102  CO2 25* 21 26*  BUN 14 12 16   CREATININE 1.0 1.1 1.1  CALCIUM 9.7 9.6 9.5   Recent Labs    04/02/23 0000  AST 16  ALT 7  ALKPHOS 107  ALBUMIN 3.2*   Recent Labs    12/30/22 0000 03/23/23 1305 04/02/23 0000  WBC 8.3 7.7 7.4  NEUTROABS  --   --  4.00  HGB 13.1 13.2 12.9  HCT 41 42.9 41  MCV  --  93.9  --   PLT 239 252 221   Lab  Results  Component Value Date   TSH 1.77 07/06/2022   Lab Results  Component Value Date   HGBA1C 6.1 03/01/2019   Lab Results  Component Value Date   CHOL 200 10/18/2019   HDL 44 10/18/2019   LDLCALC 129 10/18/2019   TRIG 137 10/18/2019    Significant Diagnostic Results in last 30 days:  EP PPM/ICD IMPLANT  Result Date: 03/23/2023 Conclusion: Successful removal of her previously implanted dual-chamber pacemaker and insertion of a new dual-chamber pacemaker in a patient with complete heart block whose device had reached replacement and was under recall. Lewayne Bunting, MD   CUP PACEART REMOTE DEVICE CHECK  Result Date: 03/14/2023 Scheduled remote reviewed. Normal device function.  The device estimates 1 month until ERI Next remote 04/14/2023. Hassell Halim, RN, CCDS   Assessment/Plan 1. Acute on chronic diastolic congestive heart failure (HCC) Changed her Lasix to 40 mg BID Repeat Bmp in 1 week Patient is already feeling better 2. Pacemaker Gen Just changed on it  3. Chest pain, unspecified type Not sure the cause She is asymptomatic right now Family does not want anything aggressive   4. Primary hypertension BP running low Losartan changed to 50 mg   5. Vascular dementia without behavioral disturbance [facility]) Patient has seen to be having worsening Cognition Family aware they wan there to be comfortable  6. Goals of care, counseling/discussion MOSt Form Discussed and signed  Comfort care and only transfer to hospital if Needs not met in the Facility    Family/ staff Communication:   Labs/tests ordered:  BMP

## 2023-04-06 ENCOUNTER — Ambulatory Visit: Payer: Medicare Other | Attending: Cardiology

## 2023-04-06 DIAGNOSIS — I4430 Unspecified atrioventricular block: Secondary | ICD-10-CM | POA: Insufficient documentation

## 2023-04-06 LAB — CUP PACEART INCLINIC DEVICE CHECK
Battery Remaining Longevity: 117 mo
Battery Voltage: 3.07 V
Brady Statistic RA Percent Paced: 59 %
Brady Statistic RV Percent Paced: 72 %
Date Time Interrogation Session: 20241113134334
Implantable Lead Connection Status: 753985
Implantable Lead Connection Status: 753985
Implantable Lead Implant Date: 20151005
Implantable Lead Implant Date: 20151005
Implantable Lead Location: 753859
Implantable Lead Location: 753860
Implantable Pulse Generator Implant Date: 20241030
Lead Channel Impedance Value: 412.5 Ohm
Lead Channel Impedance Value: 525 Ohm
Lead Channel Pacing Threshold Amplitude: 0.75 V
Lead Channel Pacing Threshold Amplitude: 0.75 V
Lead Channel Pacing Threshold Amplitude: 1.25 V
Lead Channel Pacing Threshold Amplitude: 1.25 V
Lead Channel Pacing Threshold Pulse Width: 0.5 ms
Lead Channel Pacing Threshold Pulse Width: 0.5 ms
Lead Channel Pacing Threshold Pulse Width: 0.5 ms
Lead Channel Pacing Threshold Pulse Width: 0.5 ms
Lead Channel Sensing Intrinsic Amplitude: 12 mV
Lead Channel Sensing Intrinsic Amplitude: 5 mV
Lead Channel Setting Pacing Amplitude: 1.125
Lead Channel Setting Pacing Amplitude: 2.5 V
Lead Channel Setting Pacing Pulse Width: 0.5 ms
Lead Channel Setting Sensing Sensitivity: 4 mV
Pulse Gen Model: 2272
Pulse Gen Serial Number: 8218784

## 2023-04-06 NOTE — Patient Instructions (Signed)
After Your Pacemaker   Monitor your pacemaker site for redness, swelling, and drainage. Call the device clinic at [phone] if you experience these symptoms or fever/chills.  Your incision was closed with Steri-strips or staples:  You may shower 7 days after your procedure and wash your incision with soap and water. Avoid lotions, ointments, or perfumes over your incision until it is well-healed.  You may use a hot tub or a pool after your wound check appointment if the incision is completely closed.  There are no restrictions in arm movement after your wound check appointment.  You may drive, unless driving has been restricted by your healthcare providers.  Remote monitoring is used to monitor your pacemaker from home. This monitoring is scheduled every 91 days by our office. It allows us to keep an eye on the functioning of your device to ensure it is working properly. You will routinely see your Electrophysiologist annually (more often if necessary).  

## 2023-04-06 NOTE — Progress Notes (Signed)
Wound check appointment. Steri-strips removed. Wound without redness or edema. Incision edges approximated, wound well healed. Normal device function. Thresholds, sensing, and impedances consistent with implant measurements. Device programmed at chronic outputs s/p gen change.  Increased atrial output to 2.5V at 0.5 ms to maintain 2:1 safety margin. Histogram distribution appropriate for patient and level of activity. No mode switches or high ventricular rates noted. Patient educated about wound care. ROV in 3 months with implanting physician.

## 2023-04-14 DIAGNOSIS — E039 Hypothyroidism, unspecified: Secondary | ICD-10-CM | POA: Diagnosis not present

## 2023-04-14 LAB — TSH: TSH: 4.25 (ref 0.41–5.90)

## 2023-04-29 ENCOUNTER — Encounter: Payer: Self-pay | Admitting: Adult Health

## 2023-04-29 ENCOUNTER — Non-Acute Institutional Stay (SKILLED_NURSING_FACILITY): Payer: Medicare Other | Admitting: Adult Health

## 2023-04-29 DIAGNOSIS — I5032 Chronic diastolic (congestive) heart failure: Secondary | ICD-10-CM

## 2023-04-29 DIAGNOSIS — I10 Essential (primary) hypertension: Secondary | ICD-10-CM

## 2023-04-29 DIAGNOSIS — G4733 Obstructive sleep apnea (adult) (pediatric): Secondary | ICD-10-CM

## 2023-04-29 DIAGNOSIS — F0150 Vascular dementia without behavioral disturbance: Secondary | ICD-10-CM | POA: Diagnosis not present

## 2023-04-29 DIAGNOSIS — E039 Hypothyroidism, unspecified: Secondary | ICD-10-CM | POA: Diagnosis not present

## 2023-04-29 DIAGNOSIS — F3341 Major depressive disorder, recurrent, in partial remission: Secondary | ICD-10-CM

## 2023-04-29 DIAGNOSIS — I872 Venous insufficiency (chronic) (peripheral): Secondary | ICD-10-CM | POA: Diagnosis not present

## 2023-04-29 NOTE — Progress Notes (Signed)
Location:  [facility] Room Number: 124A Place of Service:  SNF [phone]) Provider:  Tamsen Roers, MD  Patient Care Team: Mahlon Gammon, MD as PCP - General (Internal Medicine) Quintella Reichert, MD as PCP - Sleep Medicine (Sleep Medicine) Marinus Maw, MD as PCP - Cardiology (Cardiology) Francena Hanly, MD as Consulting Physician (Orthopedic Surgery) Ollen Gross, MD as Consulting Physician (Orthopedic Surgery) Micki Riley, MD as Consulting Physician (Neurology) Marinus Maw, MD as Consulting Physician (Cardiology) Corky Crafts, MD as Consulting Physician (Interventional Cardiology) Mckinley Jewel, MD as Consulting Physician (Ophthalmology) Community, [facility] (Skilled Nursing Facility) Fletcher Anon, NP as Nurse Practitioner (Nurse Practitioner)  Extended Emergency Contact Information Primary Emergency Contact: Knipp,JED Address: [address]          [place] Home Phone: [phone] Mobile Phone: [phone] Relation: Son Secondary Emergency Contact: MCGARRY,LEE Mobile Phone: [phone] Relation: Daughter  Code Status:  DNR/DNH Goals of care: Advanced Directive information    04/29/2023   10:57 AM  Advanced Directives  Does Patient Have a Medical Advance Directive? Yes  Type of Estate agent of [place];Living will  Does patient want to make changes to medical advance directive? No - Patient declined  Copy of Healthcare Power of Attorney in Chart? No - copy requested     Chief Complaint  Patient presents with   Medical Management of Chronic Issues    Patient is being seen for a routine visit     HPI:  Pt is a 89 y.o. female seen today for medical management of chronic diseases.    PMH significant for breast ca, CAD with stent, subarachnoid hemorrhage, dementia, incontinence, obesity, OP, HTN, diastolic CHF, GERD,  sleep apnea, pacemaker, among others. Has a hx of breast lump as well that her family declined mammogram for due to her age and dementia.   She had a pacemaker generator change on 03/23/23.    Seen by Dr Chales Abrahams 04/04/23 and diagnosed with acute CHF due to pulmonary congestion on CXR and BNP 7000. She was given increased lasix dosing. She also had some chest pain. Her family decided to go with comfort care. Afterwards she felt better and has been stable. She is not sob. Leg edema is stable. Weight is stable from last month but over all trend downward. Does have hot flashes sometimes.   Also has trouble chewing her food due to dental extraction and has dentures.   She resides in skilled care with dementia. Needs help with ADLS but she is able to feed herself , communicate her needs, and participate in activities. MMSE 28/30 on 04/17/23.  Bartholin cyst present, no complaints today.   Uses cpap machine for sleep apnea. Sleeps a lot and needs to aroused to wake up and eat meals.    Hypothyroidism: TSH 4.25 04/14/23  Past Medical History:  Diagnosis Date   Breast cancer (HCC)    Colon polyps    Coronary artery disease 06/24/2006   STEMI inferior with BMS to mid RCA with mid basal inferior wall HK and luminal irrgularities elsewhere   Fracture of femoral neck, right (HCC) 06/13/2011   Fracture of humeral head, right, closed 06/13/2011   GERD (gastroesophageal reflux disease)    Hemorrhoids    Hyperlipemia    Hypertension    LBBB (left bundle branch block)    Macular degeneration    Myocardial infarction (HCC)    Obstructive sleep  apnea on CPAP    Osteopenia    Osteopenia    Pacemaker 02/25/2014   Rhinitis    Rotator cuff syndrome    SAH (subarachnoid hemorrhage) (HCC) 02/22/2014   Stokes Adams attack 02/22/2014   Ulnar neuropathy    Umbilical hernia    Unstable gait    Vascular dementia without behavioral disturbance (HCC) 07/04/2019   MMSE 10/11/19 28/30, 06/08/20 28/30   Wears  hearing aid    Per [facility] records   Past Surgical History:  Procedure Laterality Date   BREAST LUMPECTOMY     CATARACT EXTRACTION, BILATERAL     Per [facility]   CORONARY ANGIOPLASTY WITH STENT PLACEMENT  06/2006   Mid RCA BMS   HIP ARTHROPLASTY  06/06/2011   Procedure: ARTHROPLASTY BIPOLAR HIP;  Surgeon: Loanne Drilling;  Location: [facility] ORS;  Service: Orthopedics;  Laterality: Right;   PERMANENT PACEMAKER INSERTION N/A 02/25/2014   Procedure: PERMANENT PACEMAKER INSERTION;  Surgeon: Marinus Maw, MD;  Location: [facility] CATH LAB;  Service: Cardiovascular;  Laterality: N/A;   PPM GENERATOR CHANGEOUT N/A 03/23/2023   Procedure: PPM GENERATOR CHANGEOUT;  Surgeon: Marinus Maw, MD;  Location: [facility] INVASIVE CV LAB;  Service: Cardiovascular;  Laterality: N/A;   SHOULDER SURGERY     TONSILLECTOMY     Per [facility]    WRIST SURGERY      Allergies  Allergen Reactions   Chlor-Trimeton [Chlorpheniramine] Anaphylaxis   Codeine Anaphylaxis   Nystatin Rash    redness   Amlodipine     Per [facility]     Outpatient Encounter Medications as of 04/29/2023  Medication Sig   acetaminophen (TYLENOL) 500 MG tablet Take 500 mg by mouth every 8 (eight) hours as needed for moderate pain (pain score 4-6).   ammonium lactate (AMLACTIN) 12 % cream Apply 1 Application topically at bedtime.   aspirin EC 81 MG tablet Take 81 mg by mouth daily.   Chlorhexidine Gluconate 4 % SOLN Apply 1 application  topically 2 (two) times a week. On Tuesday or Friday   cholecalciferol (VITAMIN D) 25 MCG (1000 UNIT) tablet Take 3,000 Units by mouth daily. In the morning between 8-11 am   Dextran 70-Hypromellose (ARTIFICIAL TEARS PF OP) Place 1 drop into both eyes 2 (two) times daily as needed (dry eyes).   donepezil (ARICEPT) 10 MG tablet Take 1 tablet (10 mg total) by mouth at bedtime.   DULoxetine (CYMBALTA) 60 MG capsule Take 60 mg by mouth daily.   Emollient (AQUAPHOR ADV THERAPY  HEALING) 41 % OINT Apply 1 Application topically 2 (two) times daily as needed. Apply to left thigh bump   Emollient (CERAVE DAILY MOISTURIZING) LOTN Apply 1 Application topically every evening.   furosemide (LASIX) 20 MG tablet Take 20 mg by mouth in the morning and at bedtime.   KETOCONAZOLE, TOPICAL, 1 % SHAM Apply 1 application  topically 2 (two) times a week. Leave on for 15 min then rinse.   levothyroxine (SYNTHROID, LEVOTHROID) 25 MCG tablet Take 25 mcg by mouth daily before breakfast.    LORazepam (ATIVAN) 0.5 MG tablet Take 1 tablet (0.5 mg total) by mouth 2 (two) times daily as needed for anxiety.   losartan (COZAAR) 50 MG tablet Take 50 mg by mouth daily.   miconazole (MICOTIN) 2 % cream Apply 1 application  topically 3 (three) times daily as needed (Apply to rash under breasts and abdominal folds as needed).   morphine (ROXANOL) 20 MG/ML concentrated solution  Take 0.25 mLs (5 mg total) by mouth every 6 (six) hours as needed for severe pain (pain score 7-10).   Multiple Vitamins-Minerals (PRESERVISION AREDS 2+MULTI VIT PO) Take 1 capsule by mouth 2 (two) times daily.   nebivolol (BYSTOLIC) 10 MG tablet Take 1 tablet (10 mg total) by mouth daily.   nitroGLYCERIN (NITROSTAT) 0.4 MG SL tablet Place 0.4 mg under the tongue every 5 (five) minutes as needed for chest pain.   ondansetron (ZOFRAN) 4 MG tablet Take 4 mg by mouth every 6 (six) hours as needed for nausea or vomiting.   potassium chloride SA (K-DUR,KLOR-CON) 20 MEQ tablet Take by mouth 2 (two) times daily. Give 20 in the morning and 10 in the evening   pramoxine (SARNA SENSITIVE) 1 % LOTN Apply 1 Application topically See admin instructions. Apply once daily in the evening, may apply 3 times daily as needed for itchy, dry skin   sodium fluoride (PREVIDENT 5000 PLUS) 1.1 % CREA dental cream Place 1 application  onto teeth at bedtime.   UNABLE TO FIND CPAP   vitamin B-12 (CYANOCOBALAMIN) 1000 MCG tablet Take 1,000 mcg by mouth daily.    furosemide (LASIX) 40 MG tablet Take 40 mg by mouth 2 (two) times daily.   No facility-administered encounter medications on file as of 04/29/2023.    Review of Systems  Constitutional:  Negative for activity change, appetite change, chills, diaphoresis, fatigue, fever and unexpected weight change.  HENT:  Negative for congestion.        Trouble chewing food  Respiratory:  Negative for cough, shortness of breath and wheezing.   Cardiovascular:  Positive for leg swelling. Negative for chest pain and palpitations.  Gastrointestinal:  Negative for abdominal distention, abdominal pain, constipation and diarrhea.  Genitourinary:  Negative for difficulty urinating and dysuria.  Musculoskeletal:  Positive for gait problem. Negative for arthralgias, back pain, joint swelling and myalgias.  Neurological:  Negative for dizziness, tremors, seizures, syncope, facial asymmetry, speech difficulty, weakness, light-headedness, numbness and headaches.  Psychiatric/Behavioral:  Positive for confusion. Negative for agitation and behavioral problems.     Immunization History  Administered Date(s) Administered   Fluad Quad(high Dose 65+) 03/15/2023   Influenza, High Dose Seasonal PF 02/18/2017, 03/22/2019, 03/14/2020   Influenza,inj,Quad PF,6+ Mos 03/24/2018   Influenza-Unspecified 03/04/2016, 03/18/2020, 02/25/2021, 02/26/2022   Moderna Covid-19 Vaccine Bivalent Booster 16yrs & up 09/18/2021, 03/29/2022   Moderna SARS-COV2 Booster Vaccination 04/03/2020, 01/13/2021, 03/15/2023   Moderna Sars-Covid-2 Vaccination 06/04/2019, 07/03/2019, 03/04/2021   Pneumococcal Conjugate-13 05/08/2014   Pneumococcal Polysaccharide-23 10/08/2017   Td 07/09/2002, 01/16/2013   Tdap 03/26/2023   Zoster Recombinant(Shingrix) 11/18/2017, 01/17/2018   Zoster, Live 06/30/2010   Pertinent  Health Maintenance Due  Topic Date Due   INFLUENZA VACCINE  Completed   DEXA SCAN  Completed      07/07/2022   10:36 AM 08/31/2022    12:48 PM 10/27/2022    1:46 PM 12/27/2022   11:28 AM 04/29/2023   10:57 AM  Fall Risk  Falls in the past year? 0 1 1 0 0  Was there an injury with Fall? 0 1 0 0 0  Fall Risk Category Calculator 0 2 1 0 0  Patient at Risk for Falls Due to History of fall(s) History of fall(s);Impaired balance/gait;Impaired mobility History of fall(s);Impaired balance/gait;Impaired mobility No Fall Risks No Fall Risks  Fall risk Follow up Falls evaluation completed Falls evaluation completed;Education provided;Falls prevention discussed Falls evaluation completed;Education provided;Falls prevention discussed Falls evaluation completed Falls evaluation completed  Functional Status Survey:    Vitals:   04/29/23 1055  BP: 112/69  Pulse: 68  Resp: 18  Temp: 97.7 F (36.5 C)  TempSrc: Temporal  SpO2: 93%  Weight: 205 lb 9.6 oz (93.3 kg)  Height: 5' (1.524 m)   Body mass index is 40.15 kg/m. Physical Exam Vitals and nursing note reviewed.  Constitutional:      General: She is not in acute distress.    Appearance: She is not diaphoretic.  HENT:     Head: Normocephalic and atraumatic.  Neck:     Vascular: No JVD.  Cardiovascular:     Rate and Rhythm: Normal rate and regular rhythm.     Heart sounds: No murmur heard. Pulmonary:     Effort: Pulmonary effort is normal. No respiratory distress.     Breath sounds: Normal breath sounds. No wheezing.  Abdominal:     General: Bowel sounds are normal. There is no distension.     Palpations: Abdomen is soft.     Tenderness: There is no abdominal tenderness.  Musculoskeletal:     Comments: BLE edema +1  Bilateral leg erythema chronic   Skin:    General: Skin is warm and dry.  Neurological:     General: No focal deficit present.     Mental Status: She is alert. Mental status is at baseline.  Psychiatric:        Mood and Affect: Mood normal.     Labs reviewed: Recent Labs    07/15/22 0000 12/30/22 0000 04/02/23 0000  NA 140 140 142  K 4.0 4.4  4.0  CL 102 102 102  CO2 25* 21 26*  BUN 14 12 16   CREATININE 1.0 1.1 1.1  CALCIUM 9.7 9.6 9.5   Recent Labs    04/02/23 0000  AST 16  ALT 7  ALKPHOS 107  ALBUMIN 3.2*   Recent Labs    12/30/22 0000 03/23/23 1305 04/02/23 0000  WBC 8.3 7.7 7.4  NEUTROABS  --   --  4.00  HGB 13.1 13.2 12.9  HCT 41 42.9 41  MCV  --  93.9  --   PLT 239 252 221   Lab Results  Component Value Date   TSH 1.77 07/06/2022   Lab Results  Component Value Date   HGBA1C 6.1 03/01/2019   Lab Results  Component Value Date   CHOL 200 10/18/2019   HDL 44 10/18/2019   LDLCALC 129 10/18/2019   TRIG 137 10/18/2019    Significant Diagnostic Results in last 30 days:  CUP PACEART INCLINIC DEVICE CHECK  Result Date: 04/06/2023 Wound check appointment. Steri-strips removed. Wound without redness or edema. Incision edges approximated, wound well healed. Normal device function. Thresholds, sensing, and impedances consistent with implant measurements. Device programmed at chronic outputs s/p gen change.  Increased atrial output to 2.5V at 0.5 ms to maintain 2:1 safety margin. Histogram distribution appropriate for patient and level of activity. No mode switches or high ventricular rates noted. Patient educated about wound care. ROV in 3 months with implanting physician.Ancil Boozer, BSN, RN   Assessment/Plan  1. Chronic diastolic heart failure (HCC) Improved Continue lasix twice daily Continue weekly weights  2. Primary hypertension Reduce losartan to 25 mg daily due to low bp  3. Venous insufficiency Continue lasix and compression hose.   4. Acquired hypothyroidism Continue synthroid   5. Obstructive sleep apnea on CPAP CPAP qhs  6. Vascular dementia without behavioral disturbance (HCC) Progressive decline in cognition and physical function  c/w the disease. Continue supportive care in the skilled environment. Comfort care goals   7. Depression, major, recurrent, in partial remission  (HCC) Continue cymbalta   Family/ staff Communication: Nurse  Labs/tests ordered:  NA

## 2023-05-27 ENCOUNTER — Encounter: Payer: Self-pay | Admitting: Adult Health

## 2023-05-27 ENCOUNTER — Non-Acute Institutional Stay (SKILLED_NURSING_FACILITY): Payer: Self-pay | Admitting: Adult Health

## 2023-05-27 DIAGNOSIS — R634 Abnormal weight loss: Secondary | ICD-10-CM

## 2023-05-27 DIAGNOSIS — E039 Hypothyroidism, unspecified: Secondary | ICD-10-CM

## 2023-05-27 DIAGNOSIS — I10 Essential (primary) hypertension: Secondary | ICD-10-CM

## 2023-05-27 DIAGNOSIS — I5032 Chronic diastolic (congestive) heart failure: Secondary | ICD-10-CM

## 2023-05-27 DIAGNOSIS — G4733 Obstructive sleep apnea (adult) (pediatric): Secondary | ICD-10-CM

## 2023-05-27 DIAGNOSIS — I872 Venous insufficiency (chronic) (peripheral): Secondary | ICD-10-CM | POA: Diagnosis not present

## 2023-05-27 DIAGNOSIS — F0150 Vascular dementia without behavioral disturbance: Secondary | ICD-10-CM

## 2023-05-27 NOTE — Progress Notes (Signed)
 Location:  [facility] Illustrator of Service:  SNF (31) Provider:  Tawni Darlean CARROLYN Charlanne Fredia LITTIE, MD  Patient Care Team: Charlanne Fredia LITTIE, MD as PCP - General (Internal Medicine) Shlomo Wilbert SAUNDERS, MD as PCP - Sleep Medicine (Sleep Medicine) Waddell Danelle ORN, MD as PCP - Cardiology (Cardiology) Melita Drivers, MD as Consulting Physician (Orthopedic Surgery) Melodi Lerner, MD as Consulting Physician (Orthopedic Surgery) Rosemarie Eather RAMAN, MD as Consulting Physician (Neurology) Waddell Danelle ORN, MD as Consulting Physician (Cardiology) Dann Candyce RAMAN, MD as Consulting Physician (Interventional Cardiology) Rosan Credit, MD as Consulting Physician (Ophthalmology) Community, [facility] (Skilled Nursing Facility) Darlean Tawni, NP as Nurse Practitioner (Nurse Practitioner)  Extended Emergency Contact Information Primary Emergency Contact: Bither,JED Address: [address]          [place], [place] Home Phone: [phone] Mobile Phone: [phone] Relation: Son Secondary Emergency Contact: MCGARRY,LEE Mobile Phone: [phone] Relation: Daughter  Code Status:  DNR/DNH Goals of care: Advanced Directive information    04/29/2023   10:57 AM  Advanced Directives  Does Patient Have a Medical Advance Directive? Yes  Type of Estate Agent of [place];Living will  Does patient want to make changes to medical advance directive? No - Patient declined  Copy of Healthcare Power of Attorney in Chart? No - copy requested     Chief Complaint  Patient presents with   Medical Management of Chronic Issues    HPI:  Pt is a 89 y.o. female seen today for medical management of chronic diseases.    PMH significant for breast ca, CAD with stent, subarachnoid hemorrhage, dementia, incontinence, obesity, OP, HTN, diastolic CHF, GERD, sleep apnea, pacemaker, among others. Has a hx of breast lump as well that  her family declined mammogram for due to her age and dementia.   She had a pacemaker generator change on 03/23/23.    Seen by Dr Charlanne 04/04/23 and diagnosed with acute CHF due to pulmonary congestion on CXR and BNP 7000. She was given increased lasix  dosing. She also had some chest pain. Her family decided to go with comfort care. Afterwards she felt better and has been stable.  No sob, doe, pnd. Chronic edema in legs slightly improved.  BP soft at times.   Also has trouble chewing her food due to dental extraction and has dentures.   Has lost 18 lbs since Sept 2024.  ON a chopped D 3 diet.   She resides in skilled care with dementia. Needs help with ADLS but she is able to feed herself , communicate her needs, and participate in activities. MMSE 28/30 on 04/17/23.  Uses cpap machine for sleep apnea. Sleeps a lot and needs to aroused to wake up and eat meals.   Hypothyroidism: TSH 4.25 04/14/23  Past Medical History:  Diagnosis Date   Breast cancer (HCC)    Colon polyps    Coronary artery disease 06/24/2006   STEMI inferior with BMS to mid RCA with mid basal inferior wall HK and luminal irrgularities elsewhere   Fracture of femoral neck, right (HCC) 06/13/2011   Fracture of humeral head, right, closed 06/13/2011   GERD (gastroesophageal reflux disease)    Hemorrhoids    Hyperlipemia    Hypertension    LBBB (left bundle branch block)    Macular degeneration    Myocardial infarction (HCC)    Obstructive sleep apnea on CPAP    Osteopenia    Osteopenia    Pacemaker 02/25/2014  Rhinitis    Rotator cuff syndrome    SAH (subarachnoid hemorrhage) (HCC) 02/22/2014   Stokes Adams attack 02/22/2014   Ulnar neuropathy    Umbilical hernia    Unstable gait    Vascular dementia without behavioral disturbance (HCC) 07/04/2019   MMSE 10/11/19 28/30, 06/08/20 28/30   Wears hearing aid    Per [facility] records   Past Surgical History:  Procedure Laterality Date   BREAST LUMPECTOMY      CATARACT EXTRACTION, BILATERAL     Per [facility]   CORONARY ANGIOPLASTY WITH STENT PLACEMENT  06/2006   Mid RCA BMS   HIP ARTHROPLASTY  06/06/2011   Procedure: ARTHROPLASTY BIPOLAR HIP;  Surgeon: Dempsey LULLA Moan;  Location: [facility] ORS;  Service: Orthopedics;  Laterality: Right;   PERMANENT PACEMAKER INSERTION N/A 02/25/2014   Procedure: PERMANENT PACEMAKER INSERTION;  Surgeon: Danelle LELON [place], MD;  Location: [facility] CATH LAB;  Service: Cardiovascular;  Laterality: N/A;   PPM GENERATOR CHANGEOUT N/A 03/23/2023   Procedure: PPM GENERATOR CHANGEOUT;  Surgeon: Birmingham Danelle LELON, MD;  Location: [facility] INVASIVE CV LAB;  Service: Cardiovascular;  Laterality: N/A;   SHOULDER SURGERY     TONSILLECTOMY     Per [facility]    WRIST SURGERY      Allergies  Allergen Reactions   Chlor-Trimeton [Chlorpheniramine] Anaphylaxis   Codeine Anaphylaxis   Nystatin Rash    redness   Amlodipine      Per [facility]     Outpatient Encounter Medications as of 05/27/2023  Medication Sig   acetaminophen  (TYLENOL ) 500 MG tablet Take 500 mg by mouth every 8 (eight) hours as needed for moderate pain (pain score 4-6).   ammonium lactate (AMLACTIN) 12 % cream Apply 1 Application topically at bedtime.   aspirin  EC 81 MG tablet Take 81 mg by mouth daily.   Chlorhexidine  Gluconate 4 % SOLN Apply 1 application  topically 2 (two) times a week. On Tuesday or Friday   cholecalciferol  (VITAMIN D ) 25 MCG (1000 UNIT) tablet Take 3,000 Units by mouth daily. In the morning between 8-11 am   Dextran 70-Hypromellose (ARTIFICIAL TEARS PF OP) Place 1 drop into both eyes 2 (two) times daily as needed (dry eyes).   donepezil  (ARICEPT ) 10 MG tablet Take 1 tablet (10 mg total) by mouth at bedtime.   DULoxetine (CYMBALTA) 60 MG capsule Take 60 mg by mouth daily.   Emollient (AQUAPHOR ADV THERAPY HEALING) 41 % OINT Apply 1 Application topically 2 (two) times daily as needed. Apply to left thigh bump   Emollient  (CERAVE DAILY MOISTURIZING) LOTN Apply 1 Application topically every evening.   furosemide  (LASIX ) 20 MG tablet Take 20-40 mg by mouth 2 (two) times daily. 40 mg in the am and 20 mg in the afternoon   KETOCONAZOLE , TOPICAL, 1 % SHAM Apply 1 application  topically 2 (two) times a week. Leave on for 15 min then rinse.   levothyroxine (SYNTHROID, LEVOTHROID) 25 MCG tablet Take 25 mcg by mouth daily before breakfast.    LORazepam  (ATIVAN ) 0.5 MG tablet Take 1 tablet (0.5 mg total) by mouth 2 (two) times daily as needed for anxiety.   losartan (COZAAR) 50 MG tablet Take 25 mg by mouth daily.   miconazole (MICOTIN) 2 % cream Apply 1 application  topically 3 (three) times daily as needed (Apply to rash under breasts and abdominal folds as needed).   morphine  (ROXANOL) 20 MG/ML concentrated solution Take 0.25 mLs (5 mg total) by mouth every  6 (six) hours as needed for severe pain (pain score 7-10).   Multiple Vitamins-Minerals (PRESERVISION AREDS 2+MULTI VIT PO) Take 1 capsule by mouth 2 (two) times daily.   nebivolol  (BYSTOLIC ) 10 MG tablet Take 1 tablet (10 mg total) by mouth daily.   nitroGLYCERIN  (NITROSTAT ) 0.4 MG SL tablet Place 0.4 mg under the tongue every 5 (five) minutes as needed for chest pain.   ondansetron  (ZOFRAN ) 4 MG tablet Take 4 mg by mouth every 6 (six) hours as needed for nausea or vomiting.   potassium chloride  SA (K-DUR,KLOR-CON ) 20 MEQ tablet Take by mouth 2 (two) times daily. Give 20 in the morning and 10 in the evening   pramoxine (SARNA SENSITIVE) 1 % LOTN Apply 1 Application topically See admin instructions. Apply once daily in the evening, may apply 3 times daily as needed for itchy, dry skin   sodium fluoride (PREVIDENT 5000 PLUS) 1.1 % CREA dental cream Place 1 application  onto teeth at bedtime.   UNABLE TO FIND CPAP   vitamin B-12 (CYANOCOBALAMIN) 1000 MCG tablet Take 1,000 mcg by mouth daily.   No facility-administered encounter medications on file as of 05/27/2023.     Review of Systems  Constitutional:  Positive for fatigue and unexpected weight change. Negative for activity change, appetite change, chills, diaphoresis and fever.  HENT:  Negative for congestion.        Trouble chewing food  Respiratory:  Negative for cough, shortness of breath and wheezing.   Cardiovascular:  Positive for leg swelling. Negative for chest pain and palpitations.  Gastrointestinal:  Negative for abdominal distention, abdominal pain, constipation and diarrhea.  Genitourinary:  Negative for difficulty urinating and dysuria.  Musculoskeletal:  Positive for gait problem. Negative for arthralgias, back pain, joint swelling and myalgias.  Neurological:  Negative for dizziness, tremors, seizures, syncope, facial asymmetry, speech difficulty, weakness, light-headedness, numbness and headaches.  Psychiatric/Behavioral:  Positive for confusion. Negative for agitation and behavioral problems.     Immunization History  Administered Date(s) Administered   Fluad Quad(high Dose 65+) 03/15/2023   Influenza, High Dose Seasonal PF 02/18/2017, 03/22/2019, 03/14/2020   Influenza,inj,Quad PF,6+ Mos 03/24/2018   Influenza-Unspecified 03/04/2016, 03/18/2020, 02/25/2021, 02/26/2022   Moderna Covid-19 Vaccine  Bivalent Booster 80yrs & up 09/18/2021, 03/29/2022   Moderna SARS-COV2 Booster Vaccination 04/03/2020, 01/13/2021, 03/15/2023   Moderna Sars-Covid-2 Vaccination 06/04/2019, 07/03/2019, 03/04/2021   Pneumococcal Conjugate-13 05/08/2014   Pneumococcal Polysaccharide-23 10/08/2017   Td 07/09/2002, 01/16/2013   Tdap 03/26/2023   Zoster Recombinant(Shingrix) 11/18/2017, 01/17/2018   Zoster, Live 06/30/2010   Pertinent  Health Maintenance Due  Topic Date Due   INFLUENZA VACCINE  Completed   DEXA SCAN  Completed      07/07/2022   10:36 AM 08/31/2022   12:48 PM 10/27/2022    1:46 PM 12/27/2022   11:28 AM 04/29/2023   10:57 AM  Fall Risk  Falls in the past year? 0 1 1 0 0  Was there an  injury with Fall? 0 1 0 0 0  Fall Risk Category Calculator 0 2 1 0 0  Patient at Risk for Falls Due to History of fall(s) History of fall(s);Impaired balance/gait;Impaired mobility History of fall(s);Impaired balance/gait;Impaired mobility No Fall Risks No Fall Risks  Fall risk Follow up Falls evaluation completed Falls evaluation completed;Education provided;Falls prevention discussed Falls evaluation completed;Education provided;Falls prevention discussed Falls evaluation completed Falls evaluation completed   Functional Status Survey:    Vitals:   05/27/23 1412  BP: 108/64  Pulse: 66  Resp: 16  Temp: (!) 97.4 F (36.3 C)  SpO2: 93%  Weight: 201 lb (91.2 kg)   Body mass index is 39.26 kg/m. Weight: 201 lb (91.2 kg)  Wt Readings from Last 3 Encounters:  05/27/23 201 lb (91.2 kg)  04/29/23 205 lb 9.6 oz (93.3 kg)  04/04/23 213 lb 6.4 oz (96.8 kg)    Physical Exam Vitals and nursing note reviewed.  Constitutional:      General: She is not in acute distress.    Appearance: She is not diaphoretic.  HENT:     Head: Normocephalic and atraumatic.  Neck:     Vascular: No JVD.  Cardiovascular:     Rate and Rhythm: Normal rate and regular rhythm.     Heart sounds: No murmur heard. Pulmonary:     Effort: Pulmonary effort is normal. No respiratory distress.     Breath sounds: Normal breath sounds. No wheezing.  Abdominal:     General: Bowel sounds are normal. There is no distension.     Palpations: Abdomen is soft.     Tenderness: There is no abdominal tenderness.  Musculoskeletal:     Comments: BLE edema +1  Bilateral leg erythema chronic   Skin:    General: Skin is warm and dry.  Neurological:     General: No focal deficit present.     Mental Status: She is alert. Mental status is at baseline.  Psychiatric:        Mood and Affect: Mood normal.    Labs reviewed: Recent Labs    07/15/22 0000 12/30/22 0000 04/02/23 0000  NA 140 140 142  K 4.0 4.4 4.0  CL 102 102  102  CO2 25* 21 26*  BUN 14 12 16   CREATININE 1.0 1.1 1.1  CALCIUM  9.7 9.6 9.5   Recent Labs    04/02/23 0000  AST 16  ALT 7  ALKPHOS 107  ALBUMIN 3.2*   Recent Labs    12/30/22 0000 03/23/23 1305 04/02/23 0000  WBC 8.3 7.7 7.4  NEUTROABS  --   --  4.00  HGB 13.1 13.2 12.9  HCT 41 42.9 41  MCV  --  93.9  --   PLT 239 252 221   Lab Results  Component Value Date   TSH 4.25 04/14/2023   Lab Results  Component Value Date   HGBA1C 6.1 03/01/2019   Lab Results  Component Value Date   CHOL 200 10/18/2019   HDL 44 10/18/2019   LDLCALC 129 10/18/2019   TRIG 137 10/18/2019    Significant Diagnostic Results in last 30 days:  No results found.  Assessment/Plan  Weight loss Due to diuresis and eating less over time due to progressive dementia Consider discontinuing aricept  if weight loss continues.   2. Chronic diastolic heart failure (HCC) Compensated  Continue lasix  twice daily Continue weekly weights  3. Primary hypertension Monitor BP, consider dose reduction of losartan at next visit.   4. Venous insufficiency Continue lasix  and compression hose.   5. Acquired hypothyroidism Continue synthroid   6. Obstructive sleep apnea on CPAP CPAP qhs  7. Vascular dementia without behavioral disturbance (HCC) Progressive decline in cognition and physical function c/w the disease. Continue supportive care in the skilled environment. Comfort care goals   8. Depression, major, recurrent, in partial remission (HCC) Continue cymbalta   Family/ staff Communication: Nurse  Labs/tests ordered:  CHeck BMP to assess hydration status and assess for dose reduction in lasix .

## 2023-06-09 ENCOUNTER — Telehealth: Payer: Self-pay | Admitting: Adult Health

## 2023-06-09 NOTE — Telephone Encounter (Signed)
Received notification from [facility] staff that her family would like [place] to reduce her pill burden due to cost and also declining status. She has been losing weight and is on aricept. Will d/c BP is soft, will d/c nebivolol. If BP still controlled will d/c losartan Also d/c preservision, Vit D, and Vitamin B 12

## 2023-06-22 ENCOUNTER — Ambulatory Visit (INDEPENDENT_AMBULATORY_CARE_PROVIDER_SITE_OTHER): Payer: Medicare Other

## 2023-06-22 DIAGNOSIS — I4430 Unspecified atrioventricular block: Secondary | ICD-10-CM | POA: Diagnosis not present

## 2023-06-22 LAB — CUP PACEART REMOTE DEVICE CHECK
Battery Remaining Longevity: 113 mo
Battery Remaining Percentage: 95.5 %
Battery Voltage: 3.02 V
Brady Statistic AP VP Percent: 56 %
Brady Statistic AP VS Percent: 1.7 %
Brady Statistic AS VP Percent: 15 %
Brady Statistic AS VS Percent: 27 %
Brady Statistic RA Percent Paced: 57 %
Brady Statistic RV Percent Paced: 71 %
Date Time Interrogation Session: 20250129020013
Implantable Lead Connection Status: 753985
Implantable Lead Connection Status: 753985
Implantable Lead Implant Date: 20151005
Implantable Lead Implant Date: 20151005
Implantable Lead Location: 753859
Implantable Lead Location: 753860
Implantable Pulse Generator Implant Date: 20241030
Lead Channel Impedance Value: 410 Ohm
Lead Channel Impedance Value: 530 Ohm
Lead Channel Pacing Threshold Amplitude: 0.75 V
Lead Channel Pacing Threshold Amplitude: 1.25 V
Lead Channel Pacing Threshold Pulse Width: 0.5 ms
Lead Channel Pacing Threshold Pulse Width: 0.5 ms
Lead Channel Sensing Intrinsic Amplitude: 12 mV
Lead Channel Sensing Intrinsic Amplitude: 5 mV
Lead Channel Setting Pacing Amplitude: 1 V
Lead Channel Setting Pacing Amplitude: 2.5 V
Lead Channel Setting Pacing Pulse Width: 0.5 ms
Lead Channel Setting Sensing Sensitivity: 4 mV
Pulse Gen Model: 2272
Pulse Gen Serial Number: 8218784

## 2023-06-24 ENCOUNTER — Non-Acute Institutional Stay (SKILLED_NURSING_FACILITY): Payer: Self-pay | Admitting: Adult Health

## 2023-06-24 ENCOUNTER — Encounter: Payer: Self-pay | Admitting: Adult Health

## 2023-06-24 DIAGNOSIS — I5033 Acute on chronic diastolic (congestive) heart failure: Secondary | ICD-10-CM

## 2023-06-24 DIAGNOSIS — R1312 Dysphagia, oropharyngeal phase: Secondary | ICD-10-CM | POA: Diagnosis not present

## 2023-06-24 NOTE — Progress Notes (Signed)
Location:  Medical illustrator of Service:  SNF (31) Provider:   Peggye Ley, ANP [facility] [phone]   Mahlon Gammon, MD  Patient Care Team: Mahlon Gammon, MD as PCP - General (Internal Medicine) Quintella Reichert, MD as PCP - Sleep Medicine (Sleep Medicine) Marinus Maw, MD as PCP - Cardiology (Cardiology) Francena Hanly, MD as Consulting Physician (Orthopedic Surgery) Ollen Gross, MD as Consulting Physician (Orthopedic Surgery) Micki Riley, MD as Consulting Physician (Neurology) Marinus Maw, MD as Consulting Physician (Cardiology) Corky Crafts, MD as Consulting Physician (Interventional Cardiology) Mckinley Jewel, MD as Consulting Physician (Ophthalmology) Community, [facility] (Skilled Nursing Facility) Fletcher Anon, NP as Nurse Practitioner (Nurse Practitioner)  Extended Emergency Contact Information Primary Emergency Contact: Trapani,JED Address: [address]          [place] Home Phone: [phone] Mobile Phone: [phone] Relation: Son Secondary Emergency Contact: MCGARRY,LEE Mobile Phone: [phone] Relation: Daughter  Code Status:  DNR Goals of care: Advanced Directive information    04/29/2023   10:57 AM  Advanced Directives  Does Patient Have a Medical Advance Directive? Yes  Type of Estate agent of [place];Living will  Does patient want to make changes to medical advance directive? No - Patient declined  Copy of Healthcare Power of Attorney in Chart? No - copy requested     Chief Complaint  Patient presents with   Acute Visit    Hypoxia/sob    HPI:   History of Present Illness The patient is a 89 year old female with congestive heart failure, dementia, venous stasis, pacemaker,depression, and HTN  who presents with shortness of breath. She was referred by the nursing staff due to feelings of shortness of  breath.  She began experiencing shortness of breath one day ago. Her oxygen saturation was between 86% and 88%, with no improvement using pursed lip breathing. Oxygen was applied at two liters, which improved her saturation to 94%. Attempts to remove the oxygen resulted in a drop in saturation, necessitating reapplication, which made her comfortable.  She has a history of congestive heart failure, with a notable episode in November. At that time, a chest x-ray indicated congestive heart failure, and her BNP was 7212. She was treated with diuretics, which led to improvement. She does not have a recent echocardiogram available for review. Her blood pressure is currently low at 104/65, and her weight is stable at around 200 pounds. She has chronic edema in both legs, which remains unchanged.  There is concern for dysphagia, as she has experienced coughing spells during meals. There is no fever or purulent cough associated with this symptom.  Physical Exam VITALS: BP- 104/65, SaO2- 86-88%, improved to 94% after oxygen therapy MEASUREMENTS: WT- 200 pounds CARDIOVASCULAR: Low diastolic pressure. EXTREMITIES: Chronic edema in both legs.  Results LABS BNP: 7212 (03/2023)  RADIOLOGY Chest x-ray: findings consistent with congestive heart failure (03/2023)  Past Medical History:  Diagnosis Date   Breast cancer (HCC)    Colon polyps    Coronary artery disease 06/24/2006   STEMI inferior with BMS to mid RCA with mid basal inferior wall HK and luminal irrgularities elsewhere   Fracture of femoral neck, right (HCC) 06/13/2011   Fracture of humeral head, right, closed 06/13/2011   GERD (gastroesophageal reflux disease)    Hemorrhoids    Hyperlipemia    Hypertension    LBBB (left bundle branch block)    Macular  degeneration    Myocardial infarction (HCC)    Obstructive sleep apnea on CPAP    Osteopenia    Osteopenia    Pacemaker 02/25/2014   Rhinitis    Rotator cuff syndrome    SAH  (subarachnoid hemorrhage) (HCC) 02/22/2014   Stokes Adams attack 02/22/2014   Ulnar neuropathy    Umbilical hernia    Unstable gait    Vascular dementia without behavioral disturbance (HCC) 07/04/2019   MMSE 10/11/19 28/30, 06/08/20 28/30   Wears hearing aid    Per [facility] records   Past Surgical History:  Procedure Laterality Date   BREAST LUMPECTOMY     CATARACT EXTRACTION, BILATERAL     Per [facility]   CORONARY ANGIOPLASTY WITH STENT PLACEMENT  06/2006   Mid RCA BMS   HIP ARTHROPLASTY  06/06/2011   Procedure: ARTHROPLASTY BIPOLAR HIP;  Surgeon: Loanne Drilling;  Location: [facility] ORS;  Service: Orthopedics;  Laterality: Right;   PERMANENT PACEMAKER INSERTION N/A 02/25/2014   Procedure: PERMANENT PACEMAKER INSERTION;  Surgeon: Marinus Maw, MD;  Location: [facility] CATH LAB;  Service: Cardiovascular;  Laterality: N/A;   PPM GENERATOR CHANGEOUT N/A 03/23/2023   Procedure: PPM GENERATOR CHANGEOUT;  Surgeon: Marinus Maw, MD;  Location: [facility] INVASIVE CV LAB;  Service: Cardiovascular;  Laterality: N/A;   SHOULDER SURGERY     TONSILLECTOMY     Per [facility]    WRIST SURGERY      Allergies  Allergen Reactions   Chlor-Trimeton [Chlorpheniramine] Anaphylaxis   Codeine Anaphylaxis   Nystatin Rash    redness   Amlodipine     Per [facility]     Outpatient Encounter Medications as of 06/24/2023  Medication Sig   acetaminophen (TYLENOL) 500 MG tablet Take 500 mg by mouth every 8 (eight) hours as needed for moderate pain (pain score 4-6).   ammonium lactate (AMLACTIN) 12 % cream Apply 1 Application topically at bedtime.   aspirin EC 81 MG tablet Take 81 mg by mouth daily.   Chlorhexidine Gluconate 4 % SOLN Apply 1 application  topically 2 (two) times a week. On Tuesday or Friday   Dextran 70-Hypromellose (ARTIFICIAL TEARS PF OP) Place 1 drop into both eyes 2 (two) times daily as needed (dry eyes).   DULoxetine (CYMBALTA) 60 MG capsule Take 60 mg by  mouth daily.   Emollient (AQUAPHOR ADV THERAPY HEALING) 41 % OINT Apply 1 Application topically 2 (two) times daily as needed. Apply to left thigh bump   Emollient (CERAVE DAILY MOISTURIZING) LOTN Apply 1 Application topically every evening.   furosemide (LASIX) 20 MG tablet Take 20-40 mg by mouth 2 (two) times daily. 40 mg in the am and 20 mg in the afternoon   KETOCONAZOLE, TOPICAL, 1 % SHAM Apply 1 application  topically 2 (two) times a week. Leave on for 15 min then rinse.   levothyroxine (SYNTHROID, LEVOTHROID) 25 MCG tablet Take 25 mcg by mouth daily before breakfast.    LORazepam (ATIVAN) 0.5 MG tablet Take 1 tablet (0.5 mg total) by mouth 2 (two) times daily as needed for anxiety.   losartan (COZAAR) 50 MG tablet Take 25 mg by mouth daily.   miconazole (MICOTIN) 2 % cream Apply 1 application  topically 3 (three) times daily as needed (Apply to rash under breasts and abdominal folds as needed).   morphine (ROXANOL) 20 MG/ML concentrated solution Take 0.25 mLs (5 mg total) by mouth every 6 (six) hours as needed for severe pain (pain  score 7-10).   Multiple Vitamins-Minerals (PRESERVISION AREDS 2+MULTI VIT PO) Take 1 capsule by mouth 2 (two) times daily.   nitroGLYCERIN (NITROSTAT) 0.4 MG SL tablet Place 0.4 mg under the tongue every 5 (five) minutes as needed for chest pain.   ondansetron (ZOFRAN) 4 MG tablet Take 4 mg by mouth every 6 (six) hours as needed for nausea or vomiting.   potassium chloride SA (K-DUR,KLOR-CON) 20 MEQ tablet Take by mouth 2 (two) times daily. Give 20 in the morning and 10 in the evening   pramoxine (SARNA SENSITIVE) 1 % LOTN Apply 1 Application topically See admin instructions. Apply once daily in the evening, may apply 3 times daily as needed for itchy, dry skin   sodium fluoride (PREVIDENT 5000 PLUS) 1.1 % CREA dental cream Place 1 application  onto teeth at bedtime.   UNABLE TO FIND CPAP   No facility-administered encounter medications on file as of 06/24/2023.     Review of Systems  Constitutional:  Negative for activity change, appetite change, chills, diaphoresis, fatigue, fever and unexpected weight change.  HENT:  Negative for congestion.   Respiratory:  Positive for cough and shortness of breath. Negative for wheezing.   Cardiovascular:  Positive for leg swelling. Negative for chest pain and palpitations.  Gastrointestinal:  Negative for abdominal distention, abdominal pain, constipation and diarrhea.  Genitourinary:  Negative for difficulty urinating and dysuria.  Musculoskeletal:  Positive for gait problem. Negative for arthralgias, back pain, joint swelling and myalgias.  Neurological:  Negative for dizziness, tremors, seizures, syncope, facial asymmetry, speech difficulty, weakness, light-headedness, numbness and headaches.  Psychiatric/Behavioral:  Positive for confusion. Negative for agitation and behavioral problems.     Immunization History  Administered Date(s) Administered   Fluad Quad(high Dose 65+) 03/15/2023   Influenza, High Dose Seasonal PF 02/18/2017, 03/22/2019, 03/14/2020   Influenza,inj,Quad PF,6+ Mos 03/24/2018   Influenza-Unspecified 03/04/2016, 03/18/2020, 02/25/2021, 02/26/2022   Moderna Covid-19 Vaccine Bivalent Booster 30yrs & up 09/18/2021, 03/29/2022   Moderna SARS-COV2 Booster Vaccination 04/03/2020, 01/13/2021, 03/15/2023   Moderna Sars-Covid-2 Vaccination 06/04/2019, 07/03/2019, 03/04/2021   Pneumococcal Conjugate-13 05/08/2014   Pneumococcal Polysaccharide-23 10/08/2017   Td 07/09/2002, 01/16/2013   Tdap 03/26/2023   Zoster Recombinant(Shingrix) 11/18/2017, 01/17/2018   Zoster, Live 06/30/2010   Pertinent  Health Maintenance Due  Topic Date Due   INFLUENZA VACCINE  Completed   DEXA SCAN  Completed      07/07/2022   10:36 AM 08/31/2022   12:48 PM 10/27/2022    1:46 PM 12/27/2022   11:28 AM 04/29/2023   10:57 AM  Fall Risk  Falls in the past year? 0 1 1 0 0  Was there an injury with Fall? 0 1 0 0 0   Fall Risk Category Calculator 0 2 1 0 0  Patient at Risk for Falls Due to History of fall(s) History of fall(s);Impaired balance/gait;Impaired mobility History of fall(s);Impaired balance/gait;Impaired mobility No Fall Risks No Fall Risks  Fall risk Follow up Falls evaluation completed Falls evaluation completed;Education provided;Falls prevention discussed Falls evaluation completed;Education provided;Falls prevention discussed Falls evaluation completed Falls evaluation completed   Functional Status Survey:    Vitals:   06/24/23 0917  BP: 104/65  Pulse: 70  Temp: (!) 97.3 F (36.3 C)  SpO2: 90%  Weight: 200 lb 6.4 oz (90.9 kg)   Body mass index is 39.14 kg/m. Physical Exam Vitals and nursing note reviewed.  Constitutional:      General: She is not in acute distress.    Appearance: She is  not diaphoretic.  HENT:     Head: Normocephalic and atraumatic.  Neck:     Vascular: No JVD.  Cardiovascular:     Rate and Rhythm: Normal rate and regular rhythm.     Heart sounds: No murmur heard. Pulmonary:     Effort: Pulmonary effort is normal. No respiratory distress.     Breath sounds: Rales present. No wheezing.  Abdominal:     General: Bowel sounds are normal. There is no distension.     Palpations: Abdomen is soft.     Tenderness: There is no abdominal tenderness.  Musculoskeletal:     Comments: BLE edema +1  Skin:    General: Skin is warm and dry.  Neurological:     General: No focal deficit present.     Mental Status: She is alert. Mental status is at baseline.     Labs reviewed: Recent Labs    07/15/22 0000 12/30/22 0000 04/02/23 0000  NA 140 140 142  K 4.0 4.4 4.0  CL 102 102 102  CO2 25* 21 26*  BUN 14 12 16   CREATININE 1.0 1.1 1.1  CALCIUM 9.7 9.6 9.5   Recent Labs    04/02/23 0000  AST 16  ALT 7  ALKPHOS 107  ALBUMIN 3.2*   Recent Labs    12/30/22 0000 03/23/23 1305 04/02/23 0000  WBC 8.3 7.7 7.4  NEUTROABS  --   --  4.00  HGB 13.1 13.2  12.9  HCT 41 42.9 41  MCV  --  93.9  --   PLT 239 252 221   Lab Results  Component Value Date   TSH 4.25 04/14/2023   Lab Results  Component Value Date   HGBA1C 6.1 03/01/2019   Lab Results  Component Value Date   CHOL 200 10/18/2019   HDL 44 10/18/2019   LDLCALC 129 10/18/2019   TRIG 137 10/18/2019    Significant Diagnostic Results in last 30 days:  CUP PACEART REMOTE DEVICE CHECK Result Date: 06/22/2023 Scheduled remote reviewed. Normal device function.  Recent increase in daily HR's per trends Next remote 91 days. LA, CVRS    Assessment and Plan  Congestive Heart Failure  New onset hypoxia and shortness of breath. Stable weight and chronic bilateral lower extremity edema. Last episode of CHF in November with a BNP of 7212. No recent echocardiogram available. Blood pressure on the lower side (104/65). -Increase Lasix to 40mg  BID for 3 days and then re-evaluate. -Continue O2 at 2L to maintain saturation >90%. -Consider chest x-ray if hypoxia and shortness of breath persist.  Dysphagia New onset coughing spells during meals. No fever or purulent cough. -Order speech therapy evaluation.  Family/ staff Communication: discussed with her son Gretta Began  Labs/tests ordered:  NA

## 2023-06-27 ENCOUNTER — Telehealth: Payer: Self-pay | Admitting: Internal Medicine

## 2023-06-27 NOTE — Telephone Encounter (Signed)
Spoke with Son. Told son that she did need to come in to see Dr Ladona Ridgel tomorrow to make sure everything is functional. Son stated understanding.

## 2023-06-27 NOTE — Telephone Encounter (Signed)
Pt's son would like a c/b regarding whether pt really needs to come in for tomorrows appt being that she is in a facility and he is our of town. Please advise

## 2023-06-28 ENCOUNTER — Encounter: Payer: Self-pay | Admitting: Internal Medicine

## 2023-06-28 ENCOUNTER — Ambulatory Visit: Payer: Medicare Other | Attending: Internal Medicine | Admitting: Internal Medicine

## 2023-06-28 VITALS — BP 110/62 | HR 91 | Ht 60.0 in | Wt 201.0 lb

## 2023-06-28 DIAGNOSIS — I4430 Unspecified atrioventricular block: Secondary | ICD-10-CM | POA: Insufficient documentation

## 2023-06-28 DIAGNOSIS — I5032 Chronic diastolic (congestive) heart failure: Secondary | ICD-10-CM | POA: Insufficient documentation

## 2023-06-28 DIAGNOSIS — Z950 Presence of cardiac pacemaker: Secondary | ICD-10-CM | POA: Diagnosis present

## 2023-06-28 LAB — CUP PACEART INCLINIC DEVICE CHECK
Battery Remaining Longevity: 114 mo
Battery Voltage: 3.02 V
Brady Statistic RA Percent Paced: 53 %
Brady Statistic RV Percent Paced: 72 %
Date Time Interrogation Session: 20250204154547
Implantable Lead Connection Status: 753985
Implantable Lead Connection Status: 753985
Implantable Lead Implant Date: 20151005
Implantable Lead Implant Date: 20151005
Implantable Lead Location: 753859
Implantable Lead Location: 753860
Implantable Pulse Generator Implant Date: 20241030
Lead Channel Impedance Value: 400 Ohm
Lead Channel Impedance Value: 487.5 Ohm
Lead Channel Pacing Threshold Amplitude: 1 V
Lead Channel Pacing Threshold Amplitude: 1 V
Lead Channel Pacing Threshold Amplitude: 1 V
Lead Channel Pacing Threshold Amplitude: 1 V
Lead Channel Pacing Threshold Pulse Width: 0.5 ms
Lead Channel Pacing Threshold Pulse Width: 0.5 ms
Lead Channel Pacing Threshold Pulse Width: 0.5 ms
Lead Channel Pacing Threshold Pulse Width: 0.5 ms
Lead Channel Sensing Intrinsic Amplitude: 12 mV
Lead Channel Sensing Intrinsic Amplitude: 5 mV
Lead Channel Setting Pacing Amplitude: 1 V
Lead Channel Setting Pacing Amplitude: 2.5 V
Lead Channel Setting Pacing Pulse Width: 0.5 ms
Lead Channel Setting Sensing Sensitivity: 4 mV
Pulse Gen Model: 2272
Pulse Gen Serial Number: 8218784

## 2023-06-28 NOTE — Progress Notes (Signed)
 HPI [name] returns today for followup of her PPM. She is a pleasant 89 yo woman with symptomatic bradycardia and 2:1 AV block and LBBB who underwent PPM insertion approximately initially about 10 years ago with a gen change out 3 months ago. In the interim, she has had no recurrent syncope. She denies chest pain or sob. She has been bothered by peripheral edema. No other complaints today. She is now on lasix  as needed. She has developed progressive dementia.  Allergies  Allergen Reactions   Chlor-Trimeton [Chlorpheniramine] Anaphylaxis   Codeine Anaphylaxis   Nystatin Rash    redness   Amlodipine      Per [facility]      Current Outpatient Medications  Medication Sig Dispense Refill   acetaminophen  (TYLENOL ) 500 MG tablet Take 500 mg by mouth every 8 (eight) hours as needed for moderate pain (pain score 4-6).     ammonium lactate (AMLACTIN) 12 % cream Apply 1 Application topically at bedtime.     aspirin  EC 81 MG tablet Take 81 mg by mouth daily.     Chlorhexidine  Gluconate 4 % SOLN Apply 1 application  topically 2 (two) times a week. On Tuesday or Friday     Dextran 70-Hypromellose (ARTIFICIAL TEARS PF OP) Place 1 drop into both eyes 2 (two) times daily as needed (dry eyes).     DULoxetine (CYMBALTA) 60 MG capsule Take 60 mg by mouth daily.     Emollient (AQUAPHOR ADV THERAPY HEALING) 41 % OINT Apply 1 Application topically 2 (two) times daily as needed. Apply to left thigh bump     Emollient (CERAVE DAILY MOISTURIZING) LOTN Apply 1 Application topically every evening.     furosemide  (LASIX ) 20 MG tablet Take 40 mg by mouth 2 (two) times daily.     KETOCONAZOLE , TOPICAL, 1 % SHAM Apply 1 application  topically 2 (two) times a week. Leave on for 15 min then rinse.  0   levothyroxine (SYNTHROID, LEVOTHROID) 25 MCG tablet Take 25 mcg by mouth daily before breakfast.      LORazepam  (ATIVAN ) 0.5 MG tablet Take 1 tablet (0.5 mg total) by mouth 2 (two) times daily as  needed for anxiety. 30 tablet 0   losartan (COZAAR) 50 MG tablet Take 25 mg by mouth daily.     miconazole (MICOTIN) 2 % cream Apply 1 application  topically 3 (three) times daily as needed (Apply to rash under breasts and abdominal folds as needed).     morphine  (ROXANOL) 20 MG/ML concentrated solution Take 0.25 mLs (5 mg total) by mouth every 6 (six) hours as needed for severe pain (pain score 7-10).     Multiple Vitamins-Minerals (PRESERVISION AREDS 2+MULTI VIT PO) Take 1 capsule by mouth 2 (two) times daily.     nitroGLYCERIN  (NITROSTAT ) 0.4 MG SL tablet Place 0.4 mg under the tongue every 5 (five) minutes as needed for chest pain.     ondansetron  (ZOFRAN ) 4 MG tablet Take 4 mg by mouth every 6 (six) hours as needed for nausea or vomiting.     potassium chloride  SA (K-DUR,KLOR-CON ) 20 MEQ tablet Take by mouth 2 (two) times daily. Give 20 in the morning and 10 in the evening     pramoxine (SARNA SENSITIVE) 1 % LOTN Apply 1 Application topically See admin instructions. Apply once daily in the evening, may apply 3 times daily as needed for itchy, dry skin     sodium fluoride (PREVIDENT 5000 PLUS) 1.1 % CREA dental  cream Place 1 application  onto teeth at bedtime.     UNABLE TO FIND CPAP     No current facility-administered medications for this visit.     Past Medical History:  Diagnosis Date   Breast cancer (HCC)    Colon polyps    Coronary artery disease 06/24/2006   STEMI inferior with BMS to mid RCA with mid basal inferior wall HK and luminal irrgularities elsewhere   Fracture of femoral neck, right (HCC) 06/13/2011   Fracture of humeral head, right, closed 06/13/2011   GERD (gastroesophageal reflux disease)    Hemorrhoids    Hyperlipemia    Hypertension    LBBB (left bundle branch block)    Macular degeneration    Myocardial infarction (HCC)    Obstructive sleep apnea on CPAP    Osteopenia    Osteopenia    Pacemaker 02/25/2014   Rhinitis    Rotator cuff syndrome    SAH  (subarachnoid hemorrhage) (HCC) 02/22/2014   Stokes Adams attack 02/22/2014   Ulnar neuropathy    Umbilical hernia    Unstable gait    Vascular dementia without behavioral disturbance (HCC) 07/04/2019   MMSE 10/11/19 28/30, 06/08/20 28/30   Wears hearing aid    Per [facility] records    ROS:   All systems reviewed and negative except as noted in the HPI.   Past Surgical History:  Procedure Laterality Date   BREAST LUMPECTOMY     CATARACT EXTRACTION, BILATERAL     Per [facility]   CORONARY ANGIOPLASTY WITH STENT PLACEMENT  06/2006   Mid RCA BMS   HIP ARTHROPLASTY  06/06/2011   Procedure: ARTHROPLASTY BIPOLAR HIP;  Surgeon: Dempsey LULLA Moan;  Location: [facility] ORS;  Service: Orthopedics;  Laterality: Right;   PERMANENT PACEMAKER INSERTION N/A 02/25/2014   Procedure: PERMANENT PACEMAKER INSERTION;  Surgeon: Danelle LELON Birmingham, MD;  Location: [facility] CATH LAB;  Service: Cardiovascular;  Laterality: N/A;   PPM GENERATOR CHANGEOUT N/A 03/23/2023   Procedure: PPM GENERATOR CHANGEOUT;  Surgeon: Birmingham Danelle LELON, MD;  Location: [facility] INVASIVE CV LAB;  Service: Cardiovascular;  Laterality: N/A;   SHOULDER SURGERY     TONSILLECTOMY     Per [facility]    WRIST SURGERY       Family History  Problem Relation Age of Onset   Heart attack Father 45   CAD Father    CVA Sister      Social History   Socioeconomic History   Marital status: Married    Spouse name: Not on file   Number of children: Not on file   Years of education: Not on file   Highest education level: Not on file  Occupational History   Not on file  Tobacco Use   Smoking status: Never   Smokeless tobacco: Never  Vaping Use   Vaping status: Never Used  Substance and Sexual Activity   Alcohol use: Yes    Alcohol/week: 0.0 standard drinks of alcohol    Comment: occasional wine   Drug use: Never   Sexual activity: Not on file  Other Topics Concern   Not on file  Social History Narrative   Not on file    Social Drivers of Health   Financial Resource Strain: Low Risk  (10/27/2022)   Overall Financial Resource Strain (CARDIA)    Difficulty of Paying Living Expenses: Not hard at all  Food Insecurity: No Food Insecurity (10/27/2022)   Hunger Vital Sign    Worried About Running  Out of Food in the Last Year: Never true    Ran Out of Food in the Last Year: Never true  Transportation Needs: No Transportation Needs (10/27/2022)   PRAPARE - Administrator, Civil Service (Medical): No    Lack of Transportation (Non-Medical): No  Physical Activity: Inactive (08/18/2021)   Exercise Vital Sign    Days of Exercise per Week: 0 days    Minutes of Exercise per Session: 0 min  Stress: No Stress Concern Present (10/27/2022)   [employer] of Occupational Health - Occupational Stress Questionnaire    Feeling of Stress : Only a little  Social Connections: Unknown (10/27/2022)   Social Connection and Isolation Panel [NHANES]    Frequency of Communication with Friends and Family: Patient unable to answer    Frequency of Social Gatherings with Friends and Family: Patient unable to answer    Attends Religious Services: Patient unable to answer    Active Member of Clubs or Organizations: Patient unable to answer    Attends Banker Meetings: Patient unable to answer    Marital Status: Not on file  Intimate Partner Violence: Not At Risk (10/27/2022)   Humiliation, Afraid, Rape, and Kick questionnaire    Fear of Current or Ex-Partner: No    Emotionally Abused: No    Physically Abused: No    Sexually Abused: No     There were no vitals taken for this visit.  Physical Exam:  Well appearing NAD HEENT: Unremarkable Neck:  No JVD, no thyromegally Lymphatics:  No adenopathy Back:  No CVA tenderness Lungs:  Clear HEART:  Regular rate rhythm, no murmurs, no rubs, no clicks Abd:  soft, positive bowel sounds, no organomegally, no rebound, no guarding Ext:  2 plus pulses, no edema, no  cyanosis, no clubbing Skin:  No rashes no nodules Neuro:  CN II through XII intact, motor grossly intact  EKG  DEVICE  Normal device function.  See PaceArt for details.   Assess/Plan: 1. CHB - she has no escape at 30 today. She is asymptomatic, s/p PPM. 2. PPM - her [employer] DDD PM is working normally.  3. Weight gain - she has gained over 20 lbs in the past couple of years. 4. HTN - her blood pressure otay was well controlled. We will follow.   Danelle Waddell HERO.D.

## 2023-06-28 NOTE — Patient Instructions (Signed)
 Medication Instructions:  Your physician recommends that you continue on your current medications as directed. Please refer to the Current Medication list given to you today.  *If you need a refill on your cardiac medications before your next appointment, please call your pharmacy*  Lab Work: None ordered.  If you have labs (blood work) drawn today and your tests are completely normal, you will receive your results only by: MyChart Message (if you have MyChart) OR A paper copy in the mail If you have any lab test that is abnormal or we need to change your treatment, we will call you to review the results.  Testing/Procedures: None ordered.  Follow-Up: At [facility], you and your health needs are our priority.  As part of our continuing mission to provide you with exceptional heart care, we have created designated Provider Care Teams.  These Care Teams include your primary Cardiologist (physician) and Advanced Practice Providers (APPs -  Physician Assistants and Nurse Practitioners) who all work together to provide you with the care you need, when you need it.   Your next appointment:   1 year(s)  The format for your next appointment:   In Person  Provider:   Lewayne Bunting, MD{or one of the following Advanced Practice Providers on your designated Care Team:   Francis Dowse, [place] Casimiro Needle "Mardelle Matte" [place], [place] Earnest Rosier, NP  Remote monitoring is used to monitor your Pacemaker/ ICD from home. This monitoring reduces the number of office visits required to check your device to one time per year. It allows [place] to keep an eye on the functioning of your device to ensure it is working properly.   Important Information About Sugar

## 2023-07-04 ENCOUNTER — Non-Acute Institutional Stay (SKILLED_NURSING_FACILITY): Payer: Self-pay | Admitting: Internal Medicine

## 2023-07-04 DIAGNOSIS — R1312 Dysphagia, oropharyngeal phase: Secondary | ICD-10-CM

## 2023-07-04 DIAGNOSIS — I10 Essential (primary) hypertension: Secondary | ICD-10-CM

## 2023-07-04 DIAGNOSIS — F0150 Vascular dementia without behavioral disturbance: Secondary | ICD-10-CM

## 2023-07-04 DIAGNOSIS — Z950 Presence of cardiac pacemaker: Secondary | ICD-10-CM

## 2023-07-04 DIAGNOSIS — I5033 Acute on chronic diastolic (congestive) heart failure: Secondary | ICD-10-CM

## 2023-07-04 DIAGNOSIS — R634 Abnormal weight loss: Secondary | ICD-10-CM | POA: Diagnosis not present

## 2023-07-04 DIAGNOSIS — E039 Hypothyroidism, unspecified: Secondary | ICD-10-CM

## 2023-07-04 DIAGNOSIS — N6321 Unspecified lump in the left breast, upper outer quadrant: Secondary | ICD-10-CM

## 2023-07-04 DIAGNOSIS — G4733 Obstructive sleep apnea (adult) (pediatric): Secondary | ICD-10-CM

## 2023-07-04 DIAGNOSIS — F3341 Major depressive disorder, recurrent, in partial remission: Secondary | ICD-10-CM

## 2023-07-14 ENCOUNTER — Encounter: Payer: Self-pay | Admitting: Internal Medicine

## 2023-07-14 NOTE — Progress Notes (Signed)
Location:  Medical illustrator of Service:  SNF (31)  Provider:   Code Status: DNR Goals of Care:     04/29/2023   10:57 AM  Advanced Directives  Does Patient Have a Medical Advance Directive? Yes  Type of Estate agent of [place];Living will  Does patient want to make changes to medical advance directive? No - Patient declined  Copy of Healthcare Power of Attorney in Chart? No - copy requested     Chief Complaint  Patient presents with   Care Management    HPI: Patient is a 89 y.o. female seen today for medical management of chronic diseases.    Lives in SNF in [place]   Patient has h/o Chronic Diastolic CHF, Urinary Incontinence, HTN, Osteoporosis, HLD, B 12 Def, anemia, H/o  Breast cancer and Vascular Dementia  h/o SAH and OSA Has h/o Breast lump No Work up per her request   Patient underwent PPM GEN change out on 10/31  Patient continues to have Cognitive decline Her Appetite is also poor Hardly eats per Nurses Also get very anxious and needs Ativan Prn Lasix dose adjusted due to weight loss but she also has SOB sometimes Has lost 10 lbs in past few months Did not have any complains today Can use her Wheelchair Pleasantly confused  Past Medical History:  Diagnosis Date   Breast cancer (HCC)    Colon polyps    Coronary artery disease 06/24/2006   STEMI inferior with BMS to mid RCA with mid basal inferior wall HK and luminal irrgularities elsewhere   Fracture of femoral neck, right (HCC) 06/13/2011   Fracture of humeral head, right, closed 06/13/2011   GERD (gastroesophageal reflux disease)    Hemorrhoids    Hyperlipemia    Hypertension    LBBB (left bundle branch block)    Macular degeneration    Myocardial infarction (HCC)    Obstructive sleep apnea on CPAP    Osteopenia    Osteopenia    Pacemaker 02/25/2014   Rhinitis    Rotator cuff syndrome    SAH (subarachnoid hemorrhage) (HCC) 02/22/2014   St[place]  attack 02/22/2014   Ulnar neuropathy    Umbilical hernia    Unstable gait    Vascular dementia without behavioral disturbance (HCC) 07/04/2019   MMSE 10/11/19 28/30, 06/08/20 28/30   Wears hearing aid    Per [facility] records    Past Surgical History:  Procedure Laterality Date   BREAST LUMPECTOMY     CATARACT EXTRACTION, BILATERAL     Per [facility]   CORONARY ANGIOPLASTY WITH STENT PLACEMENT  06/2006   Mid RCA BMS   HIP ARTHROPLASTY  06/06/2011   Procedure: ARTHROPLASTY BIPOLAR HIP;  Surgeon: Gus Rankin Aluisio;  Location: [facility] ORS;  Service: Orthopedics;  Laterality: Right;   PERMANENT PACEMAKER INSERTION N/A 02/25/2014   Procedure: PERMANENT PACEMAKER INSERTION;  Surgeon: Marinus Maw, MD;  Location[facility] CATH LAB;  Service: Cardiovascular;  Laterality: N/A;   PPM GENERATOR CHANGEOUT N/A 03/23/2023   Procedure: PPM GENERATOR CHANGEOUT;  Surgeon: Marinus Maw, MD;  Location: [facility] INVASIVE CV LAB;  Service: Cardiovascular;  Laterality: N/A;   SHOULDER SURGERY     TONSILLECTOMY     Per [facility]    WRIST SURGERY      Allergies  Allergen Reactions   Chlor-Trimeton [Chlorpheniramine] Anaphylaxis   Codeine Anaphylaxis   Nystatin Rash    redness   Amlodipine  Per [facility]     Outpatient Encounter Medications as of 07/04/2023  Medication Sig   acetaminophen (TYLENOL) 500 MG tablet Take 500 mg by mouth every 8 (eight) hours as needed for moderate pain (pain score 4-6).   ammonium lactate (AMLACTIN) 12 % cream Apply 1 Application topically at bedtime.   aspirin EC 81 MG tablet Take 81 mg by mouth daily.   Dextran 70-Hypromellose (ARTIFICIAL TEARS PF OP) Place 1 drop into both eyes 2 (two) times daily as needed (dry eyes).   DULoxetine (CYMBALTA) 60 MG capsule Take 60 mg by mouth daily.   Emollient (AQUAPHOR ADV THERAPY HEALING) 41 % OINT Apply 1 Application topically 2 (two) times daily as needed. Apply to left thigh bump   Emollient  (CERAVE DAILY MOISTURIZING) LOTN Apply 1 Application topically every evening.   furosemide (LASIX) 20 MG tablet Take 20 mg by mouth. Takes 40 mg in am and 20 mg in pm   KETOCONAZOLE, TOPICAL, 1 % SHAM Apply 1 application  topically 2 (two) times a week. Leave on for 15 min then rinse.   levothyroxine (SYNTHROID, LEVOTHROID) 25 MCG tablet Take 25 mcg by mouth daily before breakfast.    LORazepam (ATIVAN) 0.5 MG tablet Take 1 tablet (0.5 mg total) by mouth 2 (two) times daily as needed for anxiety.   losartan (COZAAR) 50 MG tablet Take 25 mg by mouth daily.   miconazole (MICOTIN) 2 % cream Apply 1 application  topically 3 (three) times daily as needed (Apply to rash under breasts and abdominal folds as needed).   morphine (ROXANOL) 20 MG/ML concentrated solution Take 0.25 mLs (5 mg total) by mouth every 6 (six) hours as needed for severe pain (pain score 7-10).   Multiple Vitamins-Minerals (PRESERVISION AREDS 2+MULTI VIT PO) Take 1 capsule by mouth 2 (two) times daily.   nitroGLYCERIN (NITROSTAT) 0.4 MG SL tablet Place 0.4 mg under the tongue every 5 (five) minutes as needed for chest pain.   ondansetron (ZOFRAN) 4 MG tablet Take 4 mg by mouth every 6 (six) hours as needed for nausea or vomiting.   potassium chloride SA (K-DUR,KLOR-CON) 20 MEQ tablet Take 20 mEq by mouth once.   pramoxine (SARNA SENSITIVE) 1 % LOTN Apply 1 Application topically See admin instructions. Apply once daily in the evening, may apply 3 times daily as needed for itchy, dry skin   sodium fluoride (PREVIDENT 5000 PLUS) 1.1 % CREA dental cream Place 1 application  onto teeth at bedtime.   UNABLE TO FIND CPAP   No facility-administered encounter medications on file as of 07/04/2023.    Review of Systems:  Review of Systems  Unable to perform ROS: Dementia    Health Maintenance  Topic Date Due   COVID-19 Vaccine (6 - 2024-25 season) 05/10/2023   DTaP/Tdap/Td (4 - Td or Tdap) 03/25/2033   Pneumonia Vaccine 29+ Years old   Completed   INFLUENZA VACCINE  Completed   DEXA SCAN  Completed   Zoster Vaccines- Shingrix  Completed   HPV VACCINES  Aged Out    Physical Exam: Vitals:   07/14/23 0831  BP: 115/72  Pulse: 74  Resp: 18  Temp: (!) 97.2 F (36.2 C)  Weight: 200 lb (90.7 kg)   Body mass index is 39.06 kg/m. Physical Exam Vitals reviewed.  Constitutional:      Appearance: Normal appearance.  HENT:     Head: Normocephalic.     Nose: Nose normal.     Mouth/Throat:  Mouth: Mucous membranes are moist.     Pharynx: Oropharynx is clear.  Eyes:     Pupils: Pupils are equal, round, and reactive to light.  Cardiovascular:     Rate and Rhythm: Normal rate and regular rhythm.     Pulses: Normal pulses.     Heart sounds: Normal heart sounds. No murmur heard. Pulmonary:     Effort: Pulmonary effort is normal.     Breath sounds: Rales present.  Abdominal:     General: Abdomen is flat. Bowel sounds are normal.     Palpations: Abdomen is soft.  Musculoskeletal:        General: No swelling.     Cervical back: Neck supple.     Comments: Mild edema Bilateral  Skin:    General: Skin is warm.  Neurological:     General: No focal deficit present.     Mental Status: She is alert and oriented to person, place, and time.  Psychiatric:        Mood and Affect: Mood normal.        Thought Content: Thought content normal.     Labs reviewed: Basic Metabolic Panel: Recent Labs    07/15/22 0000 12/30/22 0000 04/02/23 0000 04/14/23 0000  NA 140 140 142  --   K 4.0 4.4 4.0  --   CL 102 102 102  --   CO2 25* 21 26*  --   BUN 14 12 16   --   CREATININE 1.0 1.1 1.1  --   CALCIUM 9.7 9.6 9.5  --   TSH  --   --   --  4.25   Liver Function Tests: Recent Labs    04/02/23 0000  AST 16  ALT 7  ALKPHOS 107  ALBUMIN 3.2*   No results for input(s): "LIPASE", "AMYLASE" in the last 8760 hours. No results for input(s): "AMMONIA" in the last 8760 hours. CBC: Recent Labs    12/30/22 0000  03/23/23 1305 04/02/23 0000  WBC 8.3 7.7 7.4  NEUTROABS  --   --  4.00  HGB 13.1 13.2 12.9  HCT 41 42.9 41  MCV  --  93.9  --   PLT 239 252 221   Lipid Panel: No results for input(s): "CHOL", "HDL", "LDLCALC", "TRIG", "CHOLHDL", "LDLDIRECT" in the last 8760 hours. Lab Results  Component Value Date   HGBA1C 6.1 03/01/2019    Procedures since last visit: CUP PACEART INCLINIC DEVICE CHECK Result Date: 06/28/2023 Normal in-clinic Pacemaker check. Thresholds, sensing, and impedance WNL or stable for patient over time. Estimated longevity 9.6 years. Pt enrolled in remote follow-up. Multiple PMT episodes noted. Provider aware.Casilda Carls, BSN, RN  CUP PACEART REMOTE DEVICE CHECK Result Date: 06/22/2023 Scheduled remote reviewed. Normal device function.  Recent increase in daily HR's per trends Next remote 91 days. LA, CVRS   Assessment/Plan 1. Acute on chronic diastolic congestive heart failure (HCC) (Primary) Adjust Lasix according to her need Has lost weight recently Family does not want anything Agg[facility]osp[facility] Referral has been made BMP done in 01/25 was in good limits Using Oxygen PRn 2. Oropharyngeal dysphagia Now on D1 Puree diet Eating less  3. Weight loss Has lost weight Hospice referral made per family request  4. Primary hypertension Some BP readings are low Will Discontinue Cozaar  5. Acquired hypothyroidism TSH normal in 11/24  6. Obstructive sleep apnea on CPAP   7. Vascular dementia without behavioral disturbance (HCC) Needs help with her ADLS Not on Any meds except  Aspirin   8. Depression, major, recurrent, in partial remission (HCC) Cymbalta Also Ativan Renewed for Anxiety   9. Pacemaker   10. Mass of upper outer quadrant of left breast No Work up per her Choice     Labs/tests ordered:  * No order type specified * Next appt:  Visit date not found

## 2023-07-20 NOTE — Telephone Encounter (Signed)
 Error   This encounter was created in error - please disregard.

## 2023-07-23 DEATH — deceased

## 2023-08-01 NOTE — Progress Notes (Signed)
 Remote pacemaker transmission.
